# Patient Record
Sex: Female | Born: 1940 | Hispanic: No | Smoking: Former smoker
Health system: Southern US, Community
[De-identification: ages and names within clinical notes are randomized; demographics above are authoritative.]

## PROBLEM LIST (undated history)

## (undated) DIAGNOSIS — I2510 Atherosclerotic heart disease of native coronary artery without angina pectoris: Secondary | ICD-10-CM

## (undated) DIAGNOSIS — I272 Pulmonary hypertension, unspecified: Secondary | ICD-10-CM

## (undated) DIAGNOSIS — G4733 Obstructive sleep apnea (adult) (pediatric): Secondary | ICD-10-CM

## (undated) DIAGNOSIS — E785 Hyperlipidemia, unspecified: Secondary | ICD-10-CM

## (undated) DIAGNOSIS — I10 Essential (primary) hypertension: Secondary | ICD-10-CM

## (undated) DIAGNOSIS — J45909 Unspecified asthma, uncomplicated: Secondary | ICD-10-CM

## (undated) DIAGNOSIS — I5042 Chronic combined systolic (congestive) and diastolic (congestive) heart failure: Secondary | ICD-10-CM

## (undated) DIAGNOSIS — E119 Type 2 diabetes mellitus without complications: Secondary | ICD-10-CM

## (undated) HISTORY — PX: OTHER SURGICAL HISTORY: SHX169

---

## 2017-03-07 ENCOUNTER — Encounter: Payer: Self-pay | Admitting: Emergency Medicine

## 2017-03-07 ENCOUNTER — Inpatient Hospital Stay
Admission: EM | Admit: 2017-03-07 | Discharge: 2017-03-11 | DRG: 286 | Disposition: A | Discharge status: Home / Self Care | Payer: Medicare Other | Attending: Internal Medicine | Admitting: Internal Medicine

## 2017-03-07 ENCOUNTER — Emergency Department: Payer: Medicare Other

## 2017-03-07 DIAGNOSIS — J810 Acute pulmonary edema: Secondary | ICD-10-CM | POA: Diagnosis not present

## 2017-03-07 DIAGNOSIS — F329 Major depressive disorder, single episode, unspecified: Secondary | ICD-10-CM | POA: Diagnosis present

## 2017-03-07 DIAGNOSIS — Z888 Allergy status to other drugs, medicaments and biological substances status: Secondary | ICD-10-CM | POA: Diagnosis not present

## 2017-03-07 DIAGNOSIS — Z79899 Other long term (current) drug therapy: Secondary | ICD-10-CM | POA: Diagnosis not present

## 2017-03-07 DIAGNOSIS — J9601 Acute respiratory failure with hypoxia: Secondary | ICD-10-CM | POA: Diagnosis present

## 2017-03-07 DIAGNOSIS — Z7982 Long term (current) use of aspirin: Secondary | ICD-10-CM

## 2017-03-07 DIAGNOSIS — R0902 Hypoxemia: Secondary | ICD-10-CM | POA: Diagnosis not present

## 2017-03-07 DIAGNOSIS — J441 Chronic obstructive pulmonary disease with (acute) exacerbation: Secondary | ICD-10-CM | POA: Diagnosis present

## 2017-03-07 DIAGNOSIS — J9600 Acute respiratory failure, unspecified whether with hypoxia or hypercapnia: Secondary | ICD-10-CM | POA: Diagnosis not present

## 2017-03-07 DIAGNOSIS — I255 Ischemic cardiomyopathy: Secondary | ICD-10-CM | POA: Diagnosis not present

## 2017-03-07 DIAGNOSIS — Z91013 Allergy to seafood: Secondary | ICD-10-CM | POA: Diagnosis not present

## 2017-03-07 DIAGNOSIS — I5043 Acute on chronic combined systolic (congestive) and diastolic (congestive) heart failure: Secondary | ICD-10-CM | POA: Diagnosis present

## 2017-03-07 DIAGNOSIS — I110 Hypertensive heart disease with heart failure: Secondary | ICD-10-CM | POA: Diagnosis present

## 2017-03-07 DIAGNOSIS — R0602 Shortness of breath: Secondary | ICD-10-CM | POA: Diagnosis not present

## 2017-03-07 DIAGNOSIS — I5021 Acute systolic (congestive) heart failure: Secondary | ICD-10-CM

## 2017-03-07 DIAGNOSIS — I5031 Acute diastolic (congestive) heart failure: Secondary | ICD-10-CM | POA: Diagnosis not present

## 2017-03-07 DIAGNOSIS — E119 Type 2 diabetes mellitus without complications: Secondary | ICD-10-CM | POA: Diagnosis present

## 2017-03-07 DIAGNOSIS — J4550 Severe persistent asthma, uncomplicated: Secondary | ICD-10-CM | POA: Diagnosis not present

## 2017-03-07 DIAGNOSIS — I5042 Chronic combined systolic (congestive) and diastolic (congestive) heart failure: Secondary | ICD-10-CM

## 2017-03-07 DIAGNOSIS — I5033 Acute on chronic diastolic (congestive) heart failure: Secondary | ICD-10-CM | POA: Diagnosis not present

## 2017-03-07 DIAGNOSIS — E785 Hyperlipidemia, unspecified: Secondary | ICD-10-CM | POA: Diagnosis present

## 2017-03-07 DIAGNOSIS — N179 Acute kidney failure, unspecified: Secondary | ICD-10-CM | POA: Diagnosis present

## 2017-03-07 DIAGNOSIS — I340 Nonrheumatic mitral (valve) insufficiency: Secondary | ICD-10-CM | POA: Diagnosis not present

## 2017-03-07 DIAGNOSIS — J4521 Mild intermittent asthma with (acute) exacerbation: Secondary | ICD-10-CM | POA: Diagnosis present

## 2017-03-07 DIAGNOSIS — E662 Morbid (severe) obesity with alveolar hypoventilation: Secondary | ICD-10-CM | POA: Diagnosis present

## 2017-03-07 DIAGNOSIS — Z794 Long term (current) use of insulin: Secondary | ICD-10-CM | POA: Diagnosis not present

## 2017-03-07 DIAGNOSIS — I10 Essential (primary) hypertension: Secondary | ICD-10-CM | POA: Diagnosis not present

## 2017-03-07 DIAGNOSIS — Z6841 Body Mass Index (BMI) 40.0 and over, adult: Secondary | ICD-10-CM | POA: Diagnosis not present

## 2017-03-07 DIAGNOSIS — J811 Chronic pulmonary edema: Secondary | ICD-10-CM | POA: Diagnosis present

## 2017-03-07 DIAGNOSIS — J45909 Unspecified asthma, uncomplicated: Secondary | ICD-10-CM | POA: Diagnosis not present

## 2017-03-07 HISTORY — DX: Essential (primary) hypertension: I10

## 2017-03-07 HISTORY — DX: Type 2 diabetes mellitus without complications: E11.9

## 2017-03-07 HISTORY — DX: Morbid (severe) obesity due to excess calories: E66.01

## 2017-03-07 HISTORY — DX: Unspecified asthma, uncomplicated: J45.909

## 2017-03-07 HISTORY — DX: Hyperlipidemia, unspecified: E78.5

## 2017-03-07 LAB — TROPONIN I
TROPONIN I: 0.1 ng/mL — AB (ref <0.03)
Troponin I: 0.03 ng/mL (ref <0.03)
Troponin I: 0.15 ng/mL (ref <0.03)
Troponin I: 0.17 ng/mL (ref <0.03)

## 2017-03-07 LAB — GLUCOSE, CAPILLARY
GLUCOSE-CAPILLARY: 178 mg/dL — AB (ref 65–99)
GLUCOSE-CAPILLARY: 242 mg/dL — AB (ref 65–99)
GLUCOSE-CAPILLARY: 136 mg/dL — AB (ref 65–99)
Glucose-Capillary: 252 mg/dL — ABNORMAL HIGH (ref 65–99)

## 2017-03-07 LAB — COMPREHENSIVE METABOLIC PANEL
ALBUMIN: 3.5 g/dL (ref 3.5–5.0)
ALT: 18 U/L (ref 14–54)
AST: 22 U/L (ref 15–41)
Alkaline Phosphatase: 62 U/L (ref 38–126)
Anion gap: 8 (ref 5–15)
BUN: 40 mg/dL — AB (ref 6–20)
CHLORIDE: 108 mmol/L (ref 101–111)
CO2: 25 mmol/L (ref 22–32)
CREATININE: 1.21 mg/dL — AB (ref 0.44–1.00)
Calcium: 9 mg/dL (ref 8.9–10.3)
GFR calc Af Amer: 50 mL/min — ABNORMAL LOW (ref >60)
GFR calc non Af Amer: 43 mL/min — ABNORMAL LOW (ref >60)
GLUCOSE: 181 mg/dL — AB (ref 65–99)
POTASSIUM: 4.2 mmol/L (ref 3.5–5.1)
Sodium: 141 mmol/L (ref 135–145)
Total Bilirubin: 0.4 mg/dL (ref 0.3–1.2)
Total Protein: 7.2 g/dL (ref 6.5–8.1)

## 2017-03-07 LAB — BLOOD GAS, VENOUS
Acid-base deficit: 1.5 mmol/L (ref 0.0–2.0)
Bicarbonate: 25.2 mmol/L (ref 20.0–28.0)
DELIVERY SYSTEMS: POSITIVE
FIO2: 0.3
O2 Saturation: 85 %
PCO2 VEN: 50 mmHg (ref 44.0–60.0)
PH VEN: 7.31 (ref 7.250–7.430)
PO2 VEN: 55 mmHg — AB (ref 32.0–45.0)
Patient temperature: 37

## 2017-03-07 LAB — LACTIC ACID, PLASMA: Lactic Acid, Venous: 1.3 mmol/L (ref 0.5–1.9)

## 2017-03-07 LAB — FIBRIN DERIVATIVES D-DIMER (ARMC ONLY): Fibrin derivatives D-dimer (ARMC): 746.57 — ABNORMAL HIGH (ref 0.00–499.00)

## 2017-03-07 LAB — CBC
HCT: 34.1 % — ABNORMAL LOW (ref 35.0–47.0)
Hemoglobin: 11.4 g/dL — ABNORMAL LOW (ref 12.0–16.0)
MCH: 30.3 pg (ref 26.0–34.0)
MCHC: 33.4 g/dL (ref 32.0–36.0)
MCV: 90.7 fL (ref 80.0–100.0)
PLATELETS: 300 10*3/uL (ref 150–440)
RBC: 3.76 MIL/uL — AB (ref 3.80–5.20)
RDW: 14.7 % — AB (ref 11.5–14.5)
WBC: 15.2 10*3/uL — AB (ref 3.6–11.0)

## 2017-03-07 MED ORDER — ALBUTEROL SULFATE (2.5 MG/3ML) 0.083% IN NEBU
2.5000 mg | INHALATION_SOLUTION | Freq: Once | RESPIRATORY_TRACT | Status: DC
  Filled 2017-03-07: qty 3

## 2017-03-07 MED ORDER — ALBUTEROL SULFATE (2.5 MG/3ML) 0.083% IN NEBU
2.5000 mg | INHALATION_SOLUTION | RESPIRATORY_TRACT | Status: DC | PRN
Start: 2017-03-07 — End: 2017-03-10
  Administered 2017-03-07 – 2017-03-10 (×5): 2.5 mg via RESPIRATORY_TRACT
  Filled 2017-03-07 (×4): qty 3

## 2017-03-07 MED ORDER — POTASSIUM CHLORIDE CRYS ER 20 MEQ PO TBCR
20.0000 meq | EXTENDED_RELEASE_TABLET | Freq: Every day | ORAL | Status: DC
  Administered 2017-03-07 – 2017-03-10 (×4): 20 meq via ORAL
  Filled 2017-03-07 (×3): qty 1

## 2017-03-07 MED ORDER — ENOXAPARIN SODIUM 40 MG/0.4ML ~~LOC~~ SOLN
40.0000 mg | SUBCUTANEOUS | Status: DC
  Administered 2017-03-07 – 2017-03-11 (×4): 40 mg via SUBCUTANEOUS
  Filled 2017-03-07 (×4): qty 0.4

## 2017-03-07 MED ORDER — MAGNESIUM SULFATE 2 GM/50ML IV SOLN
2.0000 g | Freq: Once | INTRAVENOUS | Status: AC
  Administered 2017-03-07: 2 g via INTRAVENOUS

## 2017-03-07 MED ORDER — IPRATROPIUM BROMIDE 0.02 % IN SOLN: 0.5000 mg | RESPIRATORY_TRACT | Status: DC | PRN

## 2017-03-07 MED ORDER — FUROSEMIDE 10 MG/ML IJ SOLN: 40.0000 mg | Freq: Two times a day (BID) | INTRAMUSCULAR | Status: DC

## 2017-03-07 MED ORDER — TETRAHYDROZOLINE HCL 0.05 % OP SOLN
1.0000 [drp] | Freq: Four times a day (QID) | OPHTHALMIC | Status: DC | PRN
  Administered 2017-03-07 – 2017-03-08 (×2): 2 [drp] via OPHTHALMIC
  Administered 2017-03-09 (×2): 1 [drp] via OPHTHALMIC
  Filled 2017-03-07: qty 15

## 2017-03-07 MED ORDER — ALBUTEROL SULFATE (2.5 MG/3ML) 0.083% IN NEBU
INHALATION_SOLUTION | RESPIRATORY_TRACT | Status: AC
  Administered 2017-03-07: 10 mg via RESPIRATORY_TRACT
  Filled 2017-03-07: qty 12

## 2017-03-07 MED ORDER — AMLODIPINE BESYLATE 10 MG PO TABS
10.0000 mg | ORAL_TABLET | Freq: Every day | ORAL | Status: DC
  Administered 2017-03-07 – 2017-03-09 (×3): 10 mg via ORAL
  Filled 2017-03-07 (×2): qty 1

## 2017-03-07 MED ORDER — MAGNESIUM SULFATE 2 GM/50ML IV SOLN
INTRAVENOUS | Status: AC
  Administered 2017-03-07: 2 g via INTRAVENOUS
  Filled 2017-03-07: qty 50

## 2017-03-07 MED ORDER — ORAL CARE MOUTH RINSE: 15.0000 mL | Freq: Two times a day (BID) | OROMUCOSAL | Status: DC

## 2017-03-07 MED ORDER — ASPIRIN EC 81 MG PO TBEC
81.0000 mg | DELAYED_RELEASE_TABLET | Freq: Every day | ORAL | Status: DC
  Administered 2017-03-07 – 2017-03-11 (×4): 81 mg via ORAL
  Filled 2017-03-07 (×3): qty 1

## 2017-03-07 MED ORDER — ALBUTEROL SULFATE (2.5 MG/3ML) 0.083% IN NEBU: 2.5000 mg | INHALATION_SOLUTION | Freq: Once | RESPIRATORY_TRACT | Status: DC

## 2017-03-07 MED ORDER — MOMETASONE FURO-FORMOTEROL FUM 200-5 MCG/ACT IN AERO
2.0000 | INHALATION_SPRAY | Freq: Two times a day (BID) | RESPIRATORY_TRACT | Status: DC
  Administered 2017-03-07 – 2017-03-11 (×10): 2 via RESPIRATORY_TRACT
  Filled 2017-03-07: qty 8.8

## 2017-03-07 MED ORDER — ACETAMINOPHEN 325 MG PO TABS: 650.0000 mg | ORAL_TABLET | ORAL | Status: DC | PRN

## 2017-03-07 MED ORDER — SODIUM CHLORIDE 0.9 % IV SOLN: 250.0000 mL | INTRAVENOUS | Status: DC | PRN

## 2017-03-07 MED ORDER — LACTULOSE 10 GM/15ML PO SOLN
30.0000 g | Freq: Every day | ORAL | Status: DC | PRN
  Administered 2017-03-08 – 2017-03-11 (×2): 30 g via ORAL
  Filled 2017-03-07 (×2): qty 60

## 2017-03-07 MED ORDER — ALBUTEROL (5 MG/ML) CONTINUOUS INHALATION SOLN: 10.0000 mg/h | INHALATION_SOLUTION | RESPIRATORY_TRACT | Status: DC

## 2017-03-07 MED ORDER — FUROSEMIDE 10 MG/ML IJ SOLN
40.0000 mg | Freq: Once | INTRAMUSCULAR | Status: AC
  Administered 2017-03-07: 40 mg via INTRAVENOUS
  Filled 2017-03-07: qty 4

## 2017-03-07 MED ORDER — METHYLPREDNISOLONE SODIUM SUCC 125 MG IJ SOLR
125.0000 mg | Freq: Once | INTRAMUSCULAR | Status: DC
  Filled 2017-03-07: qty 2

## 2017-03-07 MED ORDER — INSULIN ASPART 100 UNIT/ML ~~LOC~~ SOLN
0.0000 [IU] | Freq: Every day | SUBCUTANEOUS | Status: DC
  Administered 2017-03-07: 3 [IU] via SUBCUTANEOUS
  Administered 2017-03-08: 2 [IU] via SUBCUTANEOUS
  Filled 2017-03-07: qty 3
  Filled 2017-03-07: qty 1

## 2017-03-07 MED ORDER — NAPHAZOLINE-GLYCERIN 0.012-0.2 % OP SOLN: 1.0000 [drp] | Freq: Four times a day (QID) | OPHTHALMIC | Status: DC | PRN

## 2017-03-07 MED ORDER — ORAL CARE MOUTH RINSE
15.0000 mL | Freq: Two times a day (BID) | OROMUCOSAL | Status: DC
  Administered 2017-03-07 – 2017-03-11 (×7): 15 mL via OROMUCOSAL

## 2017-03-07 MED ORDER — ALBUTEROL SULFATE (2.5 MG/3ML) 0.083% IN NEBU
2.5000 mg | INHALATION_SOLUTION | Freq: Once | RESPIRATORY_TRACT | Status: AC
  Administered 2017-03-07: 10 mg via RESPIRATORY_TRACT

## 2017-03-07 MED ORDER — SODIUM CHLORIDE 0.9% FLUSH
3.0000 mL | Freq: Two times a day (BID) | INTRAVENOUS | Status: DC
  Administered 2017-03-07 – 2017-03-10 (×6): 3 mL via INTRAVENOUS

## 2017-03-07 MED ORDER — METHYLPREDNISOLONE SODIUM SUCC 125 MG IJ SOLR
60.0000 mg | Freq: Four times a day (QID) | INTRAMUSCULAR | Status: DC
  Administered 2017-03-07 – 2017-03-08 (×6): 60 mg via INTRAVENOUS
  Filled 2017-03-07 (×4): qty 2

## 2017-03-07 MED ORDER — ATORVASTATIN CALCIUM 20 MG PO TABS
40.0000 mg | ORAL_TABLET | Freq: Every day | ORAL | Status: DC
  Administered 2017-03-07 – 2017-03-11 (×5): 40 mg via ORAL
  Filled 2017-03-07 (×5): qty 2

## 2017-03-07 MED ORDER — MIRTAZAPINE 15 MG PO TABS
45.0000 mg | ORAL_TABLET | Freq: Every day | ORAL | Status: DC
  Administered 2017-03-07 – 2017-03-10 (×4): 45 mg via ORAL
  Filled 2017-03-07 (×5): qty 3

## 2017-03-07 MED ORDER — ONDANSETRON HCL 4 MG/2ML IJ SOLN: 4.0000 mg | Freq: Four times a day (QID) | INTRAMUSCULAR | Status: DC | PRN

## 2017-03-07 MED ORDER — CARVEDILOL 3.125 MG PO TABS
3.1250 mg | ORAL_TABLET | Freq: Two times a day (BID) | ORAL | Status: DC
  Administered 2017-03-07 (×2): 3.125 mg via ORAL
  Filled 2017-03-07 (×2): qty 1

## 2017-03-07 MED ORDER — INSULIN ASPART 100 UNIT/ML ~~LOC~~ SOLN
0.0000 [IU] | Freq: Three times a day (TID) | SUBCUTANEOUS | Status: DC
  Administered 2017-03-07: 3 [IU] via SUBCUTANEOUS
  Administered 2017-03-07: 5 [IU] via SUBCUTANEOUS
  Administered 2017-03-07: 2 [IU] via SUBCUTANEOUS
  Administered 2017-03-08: 3 [IU] via SUBCUTANEOUS
  Administered 2017-03-08: 5 [IU] via SUBCUTANEOUS
  Administered 2017-03-08: 3 [IU] via SUBCUTANEOUS
  Administered 2017-03-09: 8 [IU] via SUBCUTANEOUS
  Administered 2017-03-09: 3 [IU] via SUBCUTANEOUS
  Administered 2017-03-10: 5 [IU] via SUBCUTANEOUS
  Administered 2017-03-10 (×2): 2 [IU] via SUBCUTANEOUS
  Administered 2017-03-11 (×2): 3 [IU] via SUBCUTANEOUS
  Filled 2017-03-07: qty 5
  Filled 2017-03-07: qty 3
  Filled 2017-03-07 (×7): qty 1
  Filled 2017-03-07: qty 2
  Filled 2017-03-07: qty 1

## 2017-03-07 MED ORDER — SODIUM CHLORIDE 0.9% FLUSH: 3.0000 mL | INTRAVENOUS | Status: DC | PRN

## 2017-03-07 MED ORDER — FUROSEMIDE 10 MG/ML IJ SOLN
20.0000 mg | Freq: Two times a day (BID) | INTRAMUSCULAR | Status: DC
  Administered 2017-03-07 (×2): 20 mg via INTRAVENOUS
  Filled 2017-03-07 (×2): qty 2

## 2017-03-07 NOTE — Consult Note (Signed)
Cardiology Consultation Note  Patient ID: Angela Pham, MRN: [id number], DOB/AGE: [birth year] 76 y.o. Admit date: 03/07/2017   Date of Consult: 03/07/2017 Primary Physician: [facility], Wanda Primary Cardiologist: Dr. Fletcher Anon, MD Requesting Physician: Dr. Saundra Shelling, MD  Chief Complaint: SOB / Respiratory distress Reason for Consult: Elevated troponin in setting of acute respiratory distress  HPI: Angela Pham is a 76 y.o. female who is being seen today for the evaluation of dyspnea, chest tightness, and elevated troponin in the setting of acute respiratory distress and at the request of Dr. Saundra Shelling, MD. Per ED & pt. documentation, patient has a h/o acute congestive heart failure, pulmonary edema, bronchial exacerbation, asthma, HTN, DM2, & HLD.   Currently, patient is not experiencing any chest pain or tightness but does endorse SOB. She reports that she started having trouble with wheezing this weekend & that, around 12AM this Monday morning (6/11), she felt that she could not breathe and so administered 5 neb treatments between 12AM-1:30AM. She states the treatments did not povide relief; therefore, she presented to [facility] ED via ambulance. She reports feeling a midsternum chest tightness (rated 3.5/10) around that time that did not radiate & without any associated numbness, tingling, diaphoresis, n/v/d, or abdominal pain. She states that she also noted chest tightness, emphasizing that her main concern is her inability to breathe, stating "I am so short of breath that I can barely make it to the bathroom."  The patient does not report any past cardiac history though ED documentation reports congestive heart failure. Pt. states that since moving from [place] to [place] in May 2017, she has noticed trouble breathing. She attributes her breathing exacerbations to [place] allergies and an increased humidity. She reports her father received a pacemaker and died of a heart attack  sometime after the age of 74 y.o. She admits she is a poor historian regarding family history d/t leaving her family & [place] in the 90s and not remaining in communication with her family. Today, she reports all of her immediate family as deceased & thinks that her mother, 3 brothers, and 3 sisters, are all deceased d/t cancer.   The patient is a retired Educational psychologist since 2015 and reports a poor diet and no exercise. She reports that even before this weekend's exacerbation, she could not walk a flight of stairs and often found herself SOB when walking to the bathroom. She reports a past history of smoking (1 pack / week, quit in 90s) and drinking (quit in 2016) but currently does not drink, smoke cigarettes, or use any illicit drugs.   Upon the patient's arrival to [facility] she was placed on BiPAP after being found to have BP 139-144/54-72, HR 117-138, temp 97.7-97.9, oxygen saturation 85-98% on room air, & weight 171 pounds. EKG showed sinus tachycardia with multiple PVCs, old anterior MI, nonspecific AV conduction delay, and borderline T abnormalities in the inferior leads, CXR showed cardiomegaly with mild central congestion and mild diffuse interstitial opacities, suggestive of mild edema. Labs showed increased WBC (15.2), decreased RBC(3.76)/Hbg(11.4)/HCT (34.1); increased BUN (40) and creatinine (1.21); increased troponin (0.03 --> 0.10); increased glucose (181).  The patient reports taking metformin for DM2, atorvastatin for HLD, and amlodipine / losartan for HTN.   Past Medical History:  Diagnosis Date  . Asthma   . Diabetes ([place])   . Hyperlipidemia   . Hypertension       Most Recent Cardiac Studies: Echo pending   Surgical History:  Past Surgical History:  Procedure  Laterality Date  . c-section       Home Meds: Prior to Admission medications   Medication Sig Start Date End Date Taking? Authorizing Provider  amLODipine (NORVASC) 10 MG tablet Take 10 mg by mouth daily.   Yes [provider]  atorvastatin (LIPITOR) 40 MG tablet Take 40 mg by mouth daily.   Yes [provider]  Fluticasone-Salmeterol (ADVAIR DISKUS) 500-50 MCG/DOSE AEPB Inhale 1 puff into the lungs 2 (two) times daily.   Yes [provider]  Ipratropium-Albuterol (COMBIVENT IN) Inhale 3 puffs into the lungs every 4 (four) hours as needed.   Yes [provider]  ipratropium-albuterol (DUONEB) 0.5-2.5 (3) MG/3ML SOLN Inhale 3 mLs into the lungs every 4 (four) hours as needed.   Yes [provider]  lactulose (CHRONULAC) 10 GM/15ML solution Take 30 g by mouth daily.   Yes [provider]  losartan (COZAAR) 100 MG tablet Take 100 mg by mouth daily.   Yes [provider]  metFORMIN (GLUCOPHAGE) 850 MG tablet Take 1 tablet by mouth 2 (two) times daily.   Yes [provider]  mirtazapine (REMERON) 45 MG tablet Take 1 tablet by mouth at bedtime.   Yes [provider]  montelukast (SINGULAIR) 10 MG tablet Take 10 tablets by mouth daily.   Yes [provider]  Multiple Vitamins-Minerals (EYE VITAMINS PO) Take 1 tablet by mouth daily.   Yes [provider]  NON FORMULARY Take 1 tablet by mouth daily.   Yes [provider]    Inpatient Medications:  . albuterol  2.5 mg Nebulization Once   Followed by  . albuterol  2.5 mg Nebulization Once  . aspirin EC  81 mg Oral Daily  . carvedilol  3.125 mg Oral BID WC  . enoxaparin (LOVENOX) injection  40 mg Subcutaneous Q24H  . furosemide  20 mg Intravenous BID  . insulin aspart  0-15 Units Subcutaneous TID WC  . insulin aspart  0-5 Units Subcutaneous QHS  . mouth rinse  15 mL Mouth Rinse BID  . methylPREDNISolone (SOLU-MEDROL) injection  60 mg Intravenous Q6H  . potassium chloride  20 mEq Oral Daily  . sodium chloride flush  3 mL Intravenous Q12H   . sodium chloride      Allergies:  Allergies  Allergen Reactions  . Shellfish Allergy Hives and Swelling    Swelling  around face and mouth     Social History   Social History  . Marital status: Single    Spouse name: N/A  . Number of children: N/A  . Years of education: N/A   Occupational History  . Not on file.   Social History Main Topics  . Smoking status: Never Smoker  . Smokeless tobacco: Never Used  . Alcohol use No  . Drug use: No  . Sexual activity: No   Other Topics Concern  . Not on file   Social History Narrative  . No narrative on file     History reviewed. No pertinent family history.   Review of Systems: Review of Systems  Constitutional: Positive for malaise/fatigue. Negative for diaphoresis.  HENT: Negative.   Eyes: Negative.  Negative for blurred vision and double vision.  Respiratory: Positive for shortness of breath and wheezing.   Cardiovascular: Positive for orthopnea and PND. Negative for chest pain and palpitations.  Gastrointestinal: Negative.  Negative for abdominal pain.  Genitourinary: Negative.  Negative for dysuria and urgency.  Musculoskeletal: Negative.   Skin: Negative.   Neurological:  Positive for weakness. Negative for tingling, sensory change and headaches.  Endo/Heme/Allergies: Positive for environmental allergies.  Psychiatric/Behavioral: Negative.     Labs:  Recent Labs  03/07/17 0227 03/07/17 0831  TROPONINI 0.03* 0.10*   Lab Results  Component Value Date   WBC 15.2 (H) 03/07/2017   HGB 11.4 (L) 03/07/2017   HCT 34.1 (L) 03/07/2017   MCV 90.7 03/07/2017   PLT 300 03/07/2017    Recent Labs Lab 03/07/17 0227  NA 141  K 4.2  CL 108  CO2 25  BUN 40*  CREATININE 1.21*  CALCIUM 9.0  PROT 7.2  BILITOT 0.4  ALKPHOS 62  ALT 18  AST 22  GLUCOSE 181*   Radiology/Studies:  Dg Chest Portable 1 View  Result Date: 03/07/2017 CLINICAL DATA:  Shortness of breath EXAM: PORTABLE CHEST 1 VIEW COMPARISON:  None. FINDINGS: Cardiomegaly and with mild central congestion and mild perihilar interstitial opacities suggestive of mild  edema. No effusion or consolidation. Aortic atherosclerosis. No pneumothorax. IMPRESSION: Cardiomegaly with mild central congestion and mild diffuse interstitial opacities suggestive of mild edema. Electronically Signed   By: Donavan Foil M.D.   On: 03/07/2017 02:47    EKG: Interpreted by me showed: EKG showed sinus tachycardia 136, RAD,with multiple PVCs, old anterior MI, nonspecific AV conduction delay. Telemetry: Interpreted by me showed:sinus tachycardia without PVCs and HR 107  Weights: Filed Weights   03/07/17 0222  Weight: 171 lb (77.6 kg)     Physical Exam: Blood pressure (!) 143/72, pulse (!) 120, temperature 97.9 F (36.6 C), temperature source Oral, resp. rate (!) 21, height 4\' 7"  (1.397 m), weight 171 lb (77.6 kg), SpO2 97 %. Body mass index is 39.74 kg/m. General: Well developed, well nourished, in mild distress. Head: Normocephalic, atraumatic, sclera non-icteric, no xanthomas, nares are without discharge.  Neck: JVD difficult to gauge 2/2 girth. No bruits. Lungs: ++Diminished breath sounds with ++ b/l wheezes. No rales, or rhonchi. ++Mild tachypnea and labored. Heart: RRR with S1 S2. No murmurs, rubs, or gallops appreciated. Abdomen: Soft, non-tender, non-distended with normoactive bowel sounds. No hepatomegaly. No rebound/guarding. No obvious abdominal masses. Msk:  Strength and tone appear normal for age. Extremities: No clubbing or cyanosis. B/l LE edema. Distal pedal pulses are 2+ and equal bilaterally. Neuro: Alert and oriented X 3. No facial asymmetry. No focal deficit. Moves all extremities spontaneously. Psych:  Responds to questions appropriately with a normal affect.    Assessment and Plan:  Active Problems:   Pulmonary edema   CHF (congestive heart failure) ([place])   1. Acute Pulmonary edema/CHF (?syst vs diast) - CXR: cardiomegaly with mild central congestion and mild diffuse interstitial opacities suggestive of mild edema. -  Continue O2 via nasal  cannula -  Monitor volume status and monitor pulmonary edema -  Continue IV Lasix 20mg  BID - Continue to monitor oxygen and venous saturation - Echo pending. - Continue to monitor via telemetry - Daily weight - Strict I/Os (currently +600) - Continue carvedilol 3.125mg  PO BID, aspirin 81mg  PO daily - Continue to trend troponin (0.03, 0.1) - Monitor kidney function (creatinine, BUN) with diuresis  - Continue to monitor electrolytes with BMP - Heart healthy diet  2.  Acute resp failure/Asthma -H/o asthma dating back to childhood. -worse since move from [place] to [place] 1 yr ago.   -Frequently requires nebs and rescue inhaler - worse this am - Suspect resp failure multifactorial in the setting of #1 and #2. - Inhalers per IM. - Diurese as above -  Echo pending.  3. DM2 - Continue metformin 850mg  BID; insulin as needed  - Continue to monitor blood sugars (6/11 at 178)  4. HTN - Continue losartan 100mg  PO daily, amlodipine 10mg  PO daily - Monitor BP and volume overload  5. HLD - Continue atorvastatin 40mg  PO daily - Order full lipid panel  6.  Sinus tachycardia - In setting of above.  Persists despite some degree of clinical improvement. - Check D dimer with low threshold to obtain CT vs V:Q (creat mildly elevated).  7.  Elevated troponin: - in setting of above.   - Probably represents demand ischemia. - Check d dimer. - Echo pending.  If EF nl, will plan on MV.  If EF depressed, will need cath.  Signed, Marrianne Mood, PA-S  03/07/2017, 10:23 AM   _____________  Pt seen with Ms. Visser.  I have obtained history and examined Ms. Bixler independently.  76 year old female without prior cardiac history. She has a long history of asthma dating back to childhood. She moved to [place] one year ago and lives in [place] with her niece. She is relatively sedentary and does note some degree of mild lower extremity swelling in the setting of sitting most of the day. She has had  worsening dyspnea over the spring associated with wheezing. She frequently uses nebulizers and rescue inhalers. She had some dyspnea requiring multiple nebulizers on the morning of June 10 and then awoke in the early morning hours of June 11 with acute dyspnea that was unrelieved after using 5 nebulizers. Her niece called 54. She says symptoms improved after being placed on BiPAP. In the ED, chest x-ray suggested mild volume overload. White count was elevated. She has been admitted and placed on IV Lasix. She notes that she occasionally has chest tightness but only in the setting of significant wheezing. On examination, Ms. Oblinger is an obese Afro-American female. She is in no acute distress. She is awake alert and oriented 3. Her neck is obese and supple. Difficult to gauge JVP secondary to girth. No bruits. Lungs respirations regular and unlabored, clear to auscultation. Cardiac regular S1-S2, no S3, S4, or murmurs. She is tachycardic. Abdomen is obese soft, nontender, nondistended, bowel  sounds present 4. extremities warm dry, no clubbing, cyanosis. She has 1+ bilateral ankle edema. Mrs. Panos presents with acute respiratory failure which is likely multifactorial in the setting of asthma/COPD requiring frequent rescue inhaler therapy and nebulizers, along with acute pulmonary edema by chest x-ray. She has been treated with IV Lasix. Breathing has improved some and she is currently lying flat. She continues to have evidence of volume overload with lower extremity edema on examination. Echocardiogram is currently pending. She continues to have sinus tachycardia despite some degree of clinical improvement. Troponin is mildly elevated. EKG shows right axis with IVCD. Continue to follow troponins. Check d-dimer to rule out PE. Provided that echo cardioversion shows normal LV function, we would likely plan on stress testing in the a.m. If however, LV function as noted, she will likely need diagnostic  catheterization.  She would also benefit from outpt sleep study.  Murray Hodgkins, NP 03/07/2017, 2:40 PM

## 2017-03-07 NOTE — Progress Notes (Signed)
Chart reviewed patient previously seen Cardiology team was in the room when I went to the patient. Continue IV Lasix for CHF exacerbation Follow up on echo and cardiology recommendations.

## 2017-03-07 NOTE — ED Notes (Signed)
RT, Angela Pham, called for VBG and neb through bi-pap

## 2017-03-07 NOTE — ED Notes (Signed)
Date and time results received: 03/07/17 0340  Test: Troponin Critical Value: 0.03  Name of Provider Notified: Paduchowski  Orders Received? Or Actions Taken?: None

## 2017-03-07 NOTE — ED Triage Notes (Signed)
Patient presents to Emergency Department via [facility] with complaints of respiratory distress.   Hx of asthma, HTN, DM, high cholesterol  EMS gave 125 solumedrol, 2 x albuterol, 2 gram mag,   Pt treated self with 5 x duoneb treatments

## 2017-03-07 NOTE — ED Notes (Signed)
Bi-pap on standby att, Murray started att per EDP, RT notified

## 2017-03-07 NOTE — ED Notes (Signed)
Hospitalist at bedside 

## 2017-03-07 NOTE — ED Provider Notes (Signed)
[facility] Emergency Department Provider Note  Time seen: 2:23 AM  I have reviewed the triage vital signs and the nursing notes.   HISTORY  Chief Complaint Shortness of Breath and Respiratory Distress    HPI Angela Pham is a 76 y.o. female with a past medical history of asthma presents the emergency department for difficulty breathing. According to the patient for the past week or 2 she has been experiencing intermittent difficulty breathing. She states tonight it worsened significantly and quickly. She called EMS due to trouble breathing. Patient states she took 4-5 DuoNeb treatments at home without relief. EMS states a room air saturation of 85% on arrival. I placed the patient on CPAP, dosed 2 additional DuoNeb's, 125 mg of Solu-Medrol and 2 g of magnesium. Upon arrival the patient is in mild respiratory distress sitting upright in bed on CPAP satting 91-92%. Patient states mild chest pain/tightness. Significant shortness of breath, no leg pain or swelling. No history of COPD or congestive heart failure per patient. Patient is hypertensive [address] per EMS. Patient transitioned to BiPAP in the emergency department.  No past medical history on file.  There are no active problems to display for this patient.   No past surgical history on file.  Prior to Admission medications   Not on File    Allergies  Allergen Reactions  . Shellfish Allergy     No family history on file.  Social History Social History  Substance Use Topics  . Smoking status: Not on file  . Smokeless tobacco: Not on file  . Alcohol use Not on file    Review of Systems Constitutional: Negative for fever. Cardiovascular: Mild chest pain Respiratory: Significant shortness of breath Gastrointestinal: Negative for abdominal pain Musculoskeletal: Negative for leg swelling All other ROS negative  ____________________________________________   PHYSICAL EXAM:  VITAL SIGNS: ED  Triage Vitals  Enc Vitals Group     BP --      Pulse Rate 03/07/17 0221 (!) 138     Resp 03/07/17 0221 (!) 24     Temp --      Temp src --      SpO2 03/07/17 0218 (!) 85 %     Weight 03/07/17 0222 171 lb (77.6 kg)     Height 03/07/17 0222 4\' 7"  (1.397 m)     Head Circumference --      Peak Flow --      Pain Score --      Pain Loc --      Pain Edu? --      Excl. in [place]? --     Constitutional: Alert and oriented. Mild distress due to difficulty breathing Eyes: Normal exam ENT   Head: Normocephalic and atraumatic.   Mouth/Throat: Mucous membranes are moist. Cardiovascular: Normal rate, regular rhythm. No murmur Respiratory: Moderate tachypnea, significantly diminished breath sounds bilaterally without obvious wheezes rales or rhonchi. Gastrointestinal: Soft and nontender. No distention.   Musculoskeletal: Nontender with normal range of motion in all extremities. No lower extremity tenderness or edema. Neurologic:  Normal speech and language. No gross focal neurologic deficits Skin:  Skin is warm, dry and intact.  Psychiatric: Mood and affect are normal.   ____________________________________________    EKG  EKG shows sinus tachycardia 136 bpm, widened QRS, right axis deviation, Larson normal intervals and nonspecific ST changes. No ST elevation. Occasional PVCs.  ____________________________________________    RADIOLOGY  X-ray most consistent with pulmonary edema  ____________________________________________   INITIAL IMPRESSION /  ASSESSMENT AND PLAN / ED COURSE  Pertinent labs & imaging results that were available during my care of the patient were reviewed by me and considered in my medical decision making (see chart for details).  Patient presents the emergency department with difficulty breathing. Patient initially in mild respiratory distress, satting 91% on CPAP on arrival. Patient transitioned BiPAP and is much improved. Appears calm and comfortable.  States mild chest discomfort, moderate shortness of breath. No COPD or CHF history. No lower extremity edema. Patient satting 100% currently on BiPAP. We'll check labs, chest x-ray and continue to closely monitor. We will continue with IV magnesium in the emergency department.  X-ray most consistent with pulmonary edema. Blood pressure has decreased in the emergency department. Patient much more comfortable on BiPAP. We will dose IV Lasix. No history of pulmonary edema in the past per patient.   CRITICAL CARE Performed by: Harvest Dark   Total critical care time: 45 minutes  Critical care time was exclusive of separately billable procedures and treating other patients.  Critical care was necessary to treat or prevent imminent or life-threatening deterioration.  Critical care was time spent personally by me on the following activities: development of treatment plan with patient and/or surrogate as well as nursing, discussions with consultants, evaluation of patient's response to treatment, examination of patient, obtaining history from patient or surrogate, ordering and performing treatments and interventions, ordering and review of laboratory studies, ordering and review of radiographic studies, pulse oximetry and re-evaluation of patient's condition.   ____________________________________________   FINAL CLINICAL IMPRESSION(S) / ED DIAGNOSES  Dyspnea Congestive heart failure   Harvest Dark, MD 03/07/17 [phone]

## 2017-03-07 NOTE — ED Notes (Signed)
Patient taken off of BiPap and placed on 2L nasal cannula.  RT informed.

## 2017-03-07 NOTE — Care Management (Signed)
Admitted with congestive heart failure which appears to be new according to patient but it is verbally reported by care team member there is previous documentation.  Independent in all adls, denies issues accessing medical care, obtaining medications or with transportation. Patient has had decrease in her activity tolerance due to shortness of breath. Current with her PCP- Dr Gwynneth Aliment at [facility]. Current need for oxygen is acute.  may benefit from [facility]

## 2017-03-07 NOTE — H&P (Signed)
[place] at [place] NAME: Angela Pham    MR#:  [id number]  DATE OF BIRTH:  [birth year]  DATE OF ADMISSION:  03/07/2017  PRIMARY CARE PHYSICIAN: No primary care provider on file.   REQUESTING/REFERRING PHYSICIAN:   CHIEF COMPLAINT:   Chief Complaint  Patient presents with  . Shortness of Breath  . Respiratory Distress    HISTORY OF PRESENT ILLNESS: Angela Pham  is a 76 y.o. female with a known history of Bronchial asthma, diabetes mellitus, hyperlipidemia, hypertension presented to the emergency room with shortness of breath since last few weeks. Patient has a wheezing as well as swelling in both the legs. She has been retaining fluid and has orthopnea. She felt wheezy and tried some breathing treatments at home but did not help. Her oxygen saturation was low around 85% when she presented to the emergency room on room air. She was initially put on BiPAP and stabilized and blood pressure was also elevated and she came to the emergency room. Chest x-ray showed pulmonary congestion and edema. She was given IV Lasix for diuresis in the emergency room. Her blood pressure improved and she felt better and shortness of breath also came down and she was weaned of BiPAP. No complaints of any palpitations. Has some chest discomfort. Aching in nature 4 out of 10 on a scale of 1-10. Hospitalist service was consulted for further care of the patient.  PAST MEDICAL HISTORY:   Past Medical History:  Diagnosis Date  . Asthma   . Diabetes ([place])   . Hyperlipidemia   . Hypertension     PAST SURGICAL HISTORY: Past Surgical History:  Procedure Laterality Date  . c-section      SOCIAL HISTORY:  Social History  Substance Use Topics  . Smoking status: Never Smoker  . Smokeless tobacco: Never Used  . Alcohol use No    FAMILY HISTORY: History reviewed. No pertinent family history.  DRUG ALLERGIES:  Allergies  Allergen Reactions  . Shellfish Allergy  Hives and Swelling    Swelling around face and mouth     REVIEW OF SYSTEMS:   CONSTITUTIONAL: No fever, fatigue or weakness.  EYES: No blurred or double vision.  EARS, NOSE, AND THROAT: No tinnitus or ear pain.  RESPIRATORY: occasional cough, has shortness of breath, wheezing  No hemoptysis.  CARDIOVASCULAR: Has chest pain, orthopnea, edema.  GASTROINTESTINAL: No nausea, vomiting, diarrhea or abdominal pain.  GENITOURINARY: No dysuria, hematuria.  ENDOCRINE: No polyuria, nocturia,  HEMATOLOGY: No anemia, easy bruising or bleeding SKIN: No rash or lesion. MUSCULOSKELETAL: No joint pain or arthritis.   NEUROLOGIC: No tingling, numbness, weakness.  PSYCHIATRY: No anxiety or depression.   MEDICATIONS AT HOME:  Prior to Admission medications   Not on File      PHYSICAL EXAMINATION:   VITAL SIGNS: Blood pressure 139/68, pulse (!) 117, temperature 97.7 F (36.5 C), temperature source Axillary, resp. rate 19, height 4\' 7"  (1.397 m), weight 77.6 kg (171 lb), SpO2 98 %.  GENERAL:  76 y.o.-year-old patient lying in the bed with no acute distress.  EYES: Pupils equal, round, reactive to light and accommodation. No scleral icterus. Extraocular muscles intact.  HEENT: Head atraumatic, normocephalic. Oropharynx and nasopharynx clear.  NECK:  Supple, no jugular venous distention. No thyroid enlargement, no tenderness.  LUNGS: Decreased breath sounds bilaterally, bilateral wheezing, bibasilar crepitations. No use of accessory muscles of respiration.  CARDIOVASCULAR: S1, S2 normal. No murmurs, rubs, or gallops.  ABDOMEN: Soft,  nontender, nondistended. Bowel sounds present. No organomegaly or mass.  EXTREMITIES: Has pedal edema,  No cyanosis, or clubbing.  NEUROLOGIC: Cranial nerves II through XII are intact. Muscle strength 5/5 in all extremities. Sensation intact. Gait not checked.  PSYCHIATRIC: The patient is alert and oriented x 3.  SKIN: No obvious rash, lesion, or ulcer.   LABORATORY  PANEL:   CBC  Recent Labs Lab 03/07/17 0227  WBC 15.2*  HGB 11.4*  HCT 34.1*  PLT 300  MCV 90.7  MCH 30.3  MCHC 33.4  RDW 14.7*   ------------------------------------------------------------------------------------------------------------------  Chemistries   Recent Labs Lab 03/07/17 0227  NA 141  K 4.2  CL 108  CO2 25  GLUCOSE 181*  BUN 40*  CREATININE 1.21*  CALCIUM 9.0  AST 22  ALT 18  ALKPHOS 62  BILITOT 0.4   ------------------------------------------------------------------------------------------------------------------ estimated creatinine clearance is 33.6 mL/min (A) (by C-G formula based on SCr of 1.21 mg/dL (H)). ------------------------------------------------------------------------------------------------------------------ No results for input(s): TSH, T4TOTAL, T3FREE, THYROIDAB in the last 72 hours.  Invalid input(s): FREET3   Coagulation profile No results for input(s): INR, PROTIME in the last 168 hours. ------------------------------------------------------------------------------------------------------------------- No results for input(s): DDIMER in the last 72 hours. -------------------------------------------------------------------------------------------------------------------  Cardiac Enzymes  Recent Labs Lab 03/07/17 0227  TROPONINI 0.03*   ------------------------------------------------------------------------------------------------------------------ Invalid input(s): POCBNP  ---------------------------------------------------------------------------------------------------------------  Urinalysis No results found for: COLORURINE, APPEARANCEUR, LABSPEC, PHURINE, GLUCOSEU, HGBUR, BILIRUBINUR, KETONESUR, PROTEINUR, UROBILINOGEN, NITRITE, LEUKOCYTESUR   RADIOLOGY: Dg Chest Portable 1 View  Result Date: 03/07/2017 CLINICAL DATA:  Shortness of breath EXAM: PORTABLE CHEST 1 VIEW COMPARISON:  None. FINDINGS: Cardiomegaly and  with mild central congestion and mild perihilar interstitial opacities suggestive of mild edema. No effusion or consolidation. Aortic atherosclerosis. No pneumothorax. IMPRESSION: Cardiomegaly with mild central congestion and mild diffuse interstitial opacities suggestive of mild edema. Electronically Signed   By: Donavan Foil M.D.   On: 03/07/2017 02:47    EKG: Orders placed or performed during the hospital encounter of 03/07/17  . ED EKG  . ED EKG  . EKG 12-Lead  . EKG 12-Lead    IMPRESSION AND PLAN: 76 year old female patient with history of bronchial asthma, hypertension and hyperlipidemia and diabetes mellitus presented to the emergency room with the difficulty breathing. Admitting diagnosis 1. Acute congestive heart failure 2. Pulmonary edema 3. Acute bronchial last exacerbation 4. Hypertension Treatment plan Admit patient to telemetry Diurese patient with IV Lasix 40 MG every 12 hourly Albuterol nebulization treatments Start patient on ACE inhibitor and beta blocker Oxygen via nasal cannula Control blood sugars IV Solu-Medrol 125 every 6 hourly Cycle troponin to rule out ischemia Check echocardiogram Supportive care  All the records are reviewed and case discussed with ED provider. Management plans discussed with the patient, family and they are in agreement.  CODE STATUS:FULL CODE Surrogate decision maker : daughter Code Status History    This patient does not have a recorded code status. Please follow your organizational policy for patients in this situation.       TOTAL TIME TAKING CARE OF THIS PATIENT: 53 minutes.    Saundra Shelling M.D on 03/07/2017 at 4:12 AM  Between 7am to 6pm - Pager - [phone]  After 6pm go to [email or web address] - password [email or web address] [facility]  [facility]  Office  [phone]  CC: Primary care physician; No primary care provider on file.

## 2017-03-08 ENCOUNTER — Inpatient Hospital Stay (HOSPITAL_COMMUNITY)
Admit: 2017-03-08 | Discharge: 2017-03-08 | Disposition: A | Discharge status: Home / Self Care | Payer: Medicare Other | Attending: Internal Medicine | Admitting: Internal Medicine

## 2017-03-08 DIAGNOSIS — I340 Nonrheumatic mitral (valve) insufficiency: Secondary | ICD-10-CM

## 2017-03-08 DIAGNOSIS — I5031 Acute diastolic (congestive) heart failure: Secondary | ICD-10-CM

## 2017-03-08 DIAGNOSIS — J441 Chronic obstructive pulmonary disease with (acute) exacerbation: Secondary | ICD-10-CM

## 2017-03-08 DIAGNOSIS — I255 Ischemic cardiomyopathy: Secondary | ICD-10-CM

## 2017-03-08 LAB — BASIC METABOLIC PANEL
Anion gap: 8 (ref 5–15)
BUN: 50 mg/dL — ABNORMAL HIGH (ref 6–20)
CO2: 26 mmol/L (ref 22–32)
CREATININE: 1.42 mg/dL — AB (ref 0.44–1.00)
Calcium: 8.6 mg/dL — ABNORMAL LOW (ref 8.9–10.3)
Chloride: 108 mmol/L (ref 101–111)
GFR calc Af Amer: 41 mL/min — ABNORMAL LOW (ref >60)
GFR, EST NON AFRICAN AMERICAN: 36 mL/min — AB (ref >60)
Glucose, Bld: 181 mg/dL — ABNORMAL HIGH (ref 65–99)
Potassium: 4.5 mmol/L (ref 3.5–5.1)
SODIUM: 142 mmol/L (ref 135–145)

## 2017-03-08 LAB — ECHOCARDIOGRAM COMPLETE
HEIGHTINCHES: 55 in
Weight: 2990.4 oz

## 2017-03-08 LAB — GLUCOSE, CAPILLARY
GLUCOSE-CAPILLARY: 165 mg/dL — AB (ref 65–99)
Glucose-Capillary: 222 mg/dL — ABNORMAL HIGH (ref 65–99)
Glucose-Capillary: 198 mg/dL — ABNORMAL HIGH (ref 65–99)
Glucose-Capillary: 211 mg/dL — ABNORMAL HIGH (ref 65–99)

## 2017-03-08 MED ORDER — SODIUM CHLORIDE 0.9% FLUSH: 3.0000 mL | INTRAVENOUS | Status: DC | PRN

## 2017-03-08 MED ORDER — POTASSIUM CHLORIDE CRYS ER 20 MEQ PO TBCR
EXTENDED_RELEASE_TABLET | ORAL | Status: AC
  Filled 2017-03-08: qty 1

## 2017-03-08 MED ORDER — METHYLPREDNISOLONE SODIUM SUCC 40 MG IJ SOLR
40.0000 mg | Freq: Two times a day (BID) | INTRAMUSCULAR | Status: DC
  Administered 2017-03-08 – 2017-03-09 (×2): 40 mg via INTRAVENOUS
  Filled 2017-03-08 (×2): qty 1

## 2017-03-08 MED ORDER — DIPHENHYDRAMINE HCL 25 MG PO CAPS
25.0000 mg | ORAL_CAPSULE | ORAL | Status: AC
  Administered 2017-03-09: 25 mg via ORAL
  Filled 2017-03-08: qty 1

## 2017-03-08 MED ORDER — SODIUM CHLORIDE 0.9 % IV SOLN: 250.0000 mL | INTRAVENOUS | Status: DC | PRN

## 2017-03-08 MED ORDER — SODIUM CHLORIDE 0.9% FLUSH
3.0000 mL | Freq: Two times a day (BID) | INTRAVENOUS | Status: DC
  Administered 2017-03-08 – 2017-03-09 (×2): 3 mL via INTRAVENOUS

## 2017-03-08 MED ORDER — ENOXAPARIN SODIUM 40 MG/0.4ML ~~LOC~~ SOLN
SUBCUTANEOUS | Status: AC
  Filled 2017-03-08: qty 0.4

## 2017-03-08 MED ORDER — METHYLPREDNISOLONE SODIUM SUCC 125 MG IJ SOLR
INTRAMUSCULAR | Status: AC
  Filled 2017-03-08: qty 2

## 2017-03-08 MED ORDER — ASPIRIN EC 81 MG PO TBEC
DELAYED_RELEASE_TABLET | ORAL | Status: AC
  Filled 2017-03-08: qty 1

## 2017-03-08 MED ORDER — ASPIRIN 81 MG PO CHEW
81.0000 mg | CHEWABLE_TABLET | ORAL | Status: AC
  Administered 2017-03-09: 81 mg via ORAL
  Filled 2017-03-08: qty 1

## 2017-03-08 MED ORDER — AMLODIPINE BESYLATE 10 MG PO TABS
ORAL_TABLET | ORAL | Status: AC
  Filled 2017-03-08: qty 1

## 2017-03-08 MED ORDER — INSULIN ASPART 100 UNIT/ML ~~LOC~~ SOLN
SUBCUTANEOUS | Status: AC
  Filled 2017-03-08: qty 1

## 2017-03-08 MED ORDER — MONTELUKAST SODIUM 10 MG PO TABS
10.0000 mg | ORAL_TABLET | Freq: Every day | ORAL | Status: DC
  Administered 2017-03-08 – 2017-03-10 (×3): 10 mg via ORAL
  Filled 2017-03-08 (×3): qty 1

## 2017-03-08 MED ORDER — IPRATROPIUM-ALBUTEROL 0.5-2.5 (3) MG/3ML IN SOLN
3.0000 mL | RESPIRATORY_TRACT | Status: DC | PRN
  Administered 2017-03-08 – 2017-03-10 (×7): 3 mL via RESPIRATORY_TRACT
  Filled 2017-03-08 (×8): qty 3

## 2017-03-08 MED ORDER — FAMOTIDINE 20 MG PO TABS
20.0000 mg | ORAL_TABLET | ORAL | Status: AC
  Administered 2017-03-09: 20 mg via ORAL
  Filled 2017-03-08: qty 1

## 2017-03-08 MED ORDER — IPRATROPIUM-ALBUTEROL 0.5-2.5 (3) MG/3ML IN SOLN: 3.0000 mL | Freq: Four times a day (QID) | RESPIRATORY_TRACT | Status: DC

## 2017-03-08 MED ORDER — SODIUM CHLORIDE 0.9 % IV SOLN
INTRAVENOUS | Status: AC
  Administered 2017-03-09 (×2): via INTRAVENOUS

## 2017-03-08 NOTE — Progress Notes (Signed)
Inpatient Diabetes Program Recommendations  AACE/ADA: New Consensus Statement on Inpatient Glycemic Control (2015)  Target Ranges:  Prepandial:   less than 140 mg/dL      Peak postprandial:   less than 180 mg/dL (1-2 hours)      Critically ill patients:  140 - 180 mg/dL   Lab Results  Component Value Date   GLUCAP 165 (H) 03/08/2017    Review of Glycemic Control  Results for TIEARA, FLITTON (MRN [id number]) as of 03/08/2017 13:22  Ref. Range 03/07/2017 11:42 03/07/2017 16:56 03/07/2017 20:50 03/08/2017 09:04 03/08/2017 11:41  Glucose-Capillary Latest Ref Range: 65 - 99 mg/dL 242 (H) 136 (H) 252 (H) 165 (H) 222 (H)    Diabetes history: Type 2 Outpatient Diabetes medications: Metformin 850mg  bid  Current orders for Inpatient glycemic control: Novolog 0-15 units tid, Novolog 0-5 units qhs  * Solu-medrol 60mg  q6h  Inpatient Diabetes Program Recommendations:  Per ADA recommendations "consider performing an A1C on all patients with diabetes or hyperglycemia admitted to the hospital if not performed in the prior 3 months".  Consider adding Novolog 3 units tid with meals (hold if patient eats less than 50%, taper as steroids are decreased)  Spoke with patient at the bedside regarding elevated CBG with steroids and the use of insulin while inpatient- patient verbalized understanding since she has previously been in the hospital with steroids.  She does not check blood sugars at home because "they're always good"- strongly encouraged to check CBG at least 2 times a day once she goes home until she sees the blood sugars go back to normal as the steroids are tapered.    Gentry Fitz, RN, BA, MHA, CDE Diabetes Coordinator Inpatient Diabetes Program  [phone] (Team Pager) [phone] ([place]) 03/08/2017 9:41 AM

## 2017-03-08 NOTE — Progress Notes (Signed)
Progress Note  Patient Name: Angela Pham Date of Encounter: 03/08/2017  Primary Cardiologist: New - M. Fletcher Anon, MD   Subjective   No chest pain.  Breathing somewhat better this am.  She is concerned that albuterol under dosed (uses 5mg  @ home, not 2.5).  Also uses duobenb @ home.  Inpatient Medications    Scheduled Meds: . albuterol  2.5 mg Nebulization Once   Followed by  . albuterol  2.5 mg Nebulization Once  . amLODipine  10 mg Oral Daily  . aspirin EC  81 mg Oral Daily  . atorvastatin  40 mg Oral q1800  . carvedilol  3.125 mg Oral BID WC  . enoxaparin (LOVENOX) injection  40 mg Subcutaneous Q24H  . insulin aspart  0-15 Units Subcutaneous TID WC  . insulin aspart  0-5 Units Subcutaneous QHS  . mouth rinse  15 mL Mouth Rinse BID  . methylPREDNISolone (SOLU-MEDROL) injection  60 mg Intravenous Q6H  . mirtazapine  45 mg Oral QHS  . mometasone-formoterol  2 puff Inhalation BID  . potassium chloride  20 mEq Oral Daily  . sodium chloride flush  3 mL Intravenous Q12H   Continuous Infusions: . sodium chloride     PRN Meds: sodium chloride, acetaminophen, albuterol, ipratropium, lactulose, ondansetron (ZOFRAN) IV, sodium chloride flush, tetrahydrozoline   Vital Signs    Vitals:   03/07/17 1935 03/07/17 2341 03/08/17 0414 03/08/17 0441  BP: 137/65  138/70   Pulse: (!) 106  (!) 107   Resp: 18  16   Temp: 97.7 F (36.5 C)  97.6 F (36.4 C)   TempSrc: Oral  Oral   SpO2: 99% 99% 98% 97%  Weight:   186 lb 14.4 oz (84.8 kg)   Height:        Intake/Output Summary (Last 24 hours) at 03/08/17 0841 Last data filed at 03/07/17 1830  Gross per 24 hour  Intake             1080 ml  Output                0 ml  Net             1080 ml   Filed Weights   03/07/17 0222 03/08/17 0414  Weight: 171 lb (77.6 kg) 186 lb 14.4 oz (84.8 kg)    Physical Exam   GEN: obese, in no acute distress.  HEENT: Grossly normal.  Neck: Supple, obese, difficult to gauge jvp.  No carotid bruits,  or masses. Cardiac: RRR, tachy, distant, no murmurs, rubs, or gallops. No clubbing, cyanosis, trace bilat ankle edema.  Radials/DP/PT 2+ and equal bilaterally.  Respiratory:  Respirations regular and unlabored, diminished breath sounds bilat, coarse rhonchi anteriorly.  GI: Soft, nontender, nondistended, BS + x 4. MS: no deformity or atrophy. Skin: warm and dry, no rash. Neuro:  Strength and sensation are intact. Psych: AAOx3.  Normal affect.  Labs    Chemistry Recent Labs Lab 03/07/17 0227 03/08/17 0411  NA 141 142  K 4.2 4.5  CL 108 108  CO2 25 26  GLUCOSE 181* 181*  BUN 40* 50*  CREATININE 1.21* 1.42*  CALCIUM 9.0 8.6*  PROT 7.2  --   ALBUMIN 3.5  --   AST 22  --   ALT 18  --   ALKPHOS 62  --   BILITOT 0.4  --   GFRNONAA 43* 36*  GFRAA 50* 41*  ANIONGAP 8 8     Hematology Recent Labs Lab  03/07/17 0227  WBC 15.2*  RBC 3.76*  HGB 11.4*  HCT 34.1*  MCV 90.7  MCH 30.3  MCHC 33.4  RDW 14.7*  PLT 300    Cardiac Enzymes Recent Labs Lab 03/07/17 0227 03/07/17 0831 03/07/17 1417 03/07/17 2025  TROPONINI 0.03* 0.10* 0.15* 0.17*   D dimer 746.57  Radiology    Dg Chest Portable 1 View  Result Date: 03/07/2017 CLINICAL DATA:  Shortness of breath EXAM: PORTABLE CHEST 1 VIEW COMPARISON:  None. FINDINGS: Cardiomegaly and with mild central congestion and mild perihilar interstitial opacities suggestive of mild edema. No effusion or consolidation. Aortic atherosclerosis. No pneumothorax. IMPRESSION: Cardiomegaly with mild central congestion and mild diffuse interstitial opacities suggestive of mild edema. Electronically Signed   By: Donavan Foil M.D.   On: 03/07/2017 02:47    Telemetry    Sinus tachycardia, occas pvc - Personally Reviewed  Cardiac Studies   Echo pending  Patient Profile     76 y.o. female with a h/o asthma, copd, obesity, HTN, HL, and DM, admitted 6/10 with acute dyspnea and mild CHF.  Assessment & Plan    1.  Acute CHF/Pulmonary  Edema:  ? Systolic vs diastolic.  Ongoing sinus tachycardia concerning for possible low output.  Echo ordered 6/11 - still pending.  She has been receiving low dose IV lasix.  BUN/Creat/bicarb bumping - 50/1.42/26 this am.  Breathing stable.  Lower ext edema much improved.  Hold lasix this am.  If EF down by echo, will plan on diagnostic cath tomorrow am - will want renal fxn to stabilize first.  If EF nl, can plan on myoview, but would prefer that she's moving air a little better prior to giving lexiscan.  I'm going to hold her coreg as it may be contributing to asthma/copd issues.  Pending EF, can consider bisoprolol instead.  2.  Chest tightness/Elevated troponin/Elevated d dimer:  Pt reports a h/o chest tightness in the setting of asthma flares in the past.  She did have similar tightness with wheezing prior to admission.  Troponin is mildly elevated w/ a flat trend 0.03  0.10  0.15  0.17.  No recurrent c/p or tightness.  D dimer also moderately elevated.  Will eval RV w/ echo and have low threshold to add heparin and check CTA if RV down/PASP elevated.  As above, pending echo/LV fxn, we will make a decision re: cath vs myoview - most likely to take place tomorrow.  Cont asa/statin.  3.  Essential HTN:  BP mildly elevated.  Hold  blocker given resp status/asthma flare.  Holding lasix in setting of rising bun/creat.  She is on losartan @ home.  Will plan to resume in am provided that renal fxn stable.  4.  HL:  Cont statin.  F/u lipids as outpt.  5.  DM II:  Per IM,.  6. AECOPD/Ashtma/Acute resp failure:  Inhalers/nebs/steroids per IM.  It sounds like she uses frequent, high doses of rescue inhalers/nebs @ home.  She would greatly benefit from pulmonology evaluation.  Will also need outpt sleep study.  Signed, Murray Hodgkins, NP  03/08/2017, 8:41 AM

## 2017-03-08 NOTE — Discharge Instructions (Signed)
Heart Failure Clinic appointment on March 21, 2017 at 9:20am with Darylene Price, [place]. Please call [phone] to reschedule.

## 2017-03-08 NOTE — Progress Notes (Signed)
[place] at [place] NAME: Keren Alverio    MR#:  [id number]  DATE OF BIRTH:  10-22-1940  SUBJECTIVE:  Patient with wheezing this am  REVIEW OF SYSTEMS:    Review of Systems  Constitutional: Negative for fever, chills weight loss HENT: Negative for ear pain, nosebleeds, congestion, facial swelling, rhinorrhea, neck pain, neck stiffness and ear discharge.   Respiratory: Negative for cough, ++shortness of breath, wheezing  Cardiovascular: Negative for chest pain, palpitations and ++improved leg swelling.  Gastrointestinal: Negative for heartburn, abdominal pain, vomiting, diarrhea or consitpation Genitourinary: Negative for dysuria, urgency, frequency, hematuria Musculoskeletal: Negative for back pain or joint pain Neurological: Negative for dizziness, seizures, syncope, focal weakness,  numbness and headaches.  Hematological: Does not bruise/bleed easily.  Psychiatric/Behavioral: Negative for hallucinations, confusion, dysphoric mood    Tolerating Diet: yes      DRUG ALLERGIES:   Allergies  Allergen Reactions  . Shellfish Allergy Hives and Swelling    Swelling around face and mouth     VITALS:  Blood pressure (!) 143/78, pulse (!) 107, temperature 97.9 F (36.6 C), temperature source Oral, resp. rate 18, height 4\' 7"  (1.397 m), weight 84.8 kg (186 lb 14.4 oz), SpO2 96 %.  PHYSICAL EXAMINATION:  Constitutional: Mildly obese No distress. HENT: Normocephalic. Marland Kitchen Oropharynx is clear and moist.  Eyes: Conjunctivae and EOM are normal. PERRLA, no scleral icterus.  Neck: Normal ROM. Neck supple. No JVD. No tracheal deviation. CVS: RRR, S1/S2 +, no murmurs, no gallops, no carotid bruit.  Pulmonary: Effort and breath sounds normal, bilateral expiratory wheezing Abdominal: Soft. BS +,  no distension, tenderness, rebound or guarding.  Musculoskeletal: Normal range of motion. Mild pitting edema and no tenderness.  Neuro: Alert. CN 2-12  grossly intact. No focal deficits. Skin: Skin is warm and dry. No rash noted. Psychiatric: Normal mood and affect.      LABORATORY PANEL:   CBC  Recent Labs Lab 03/07/17 0227  WBC 15.2*  HGB 11.4*  HCT 34.1*  PLT 300   ------------------------------------------------------------------------------------------------------------------  Chemistries   Recent Labs Lab 03/07/17 0227 03/08/17 0411  NA 141 142  K 4.2 4.5  CL 108 108  CO2 25 26  GLUCOSE 181* 181*  BUN 40* 50*  CREATININE 1.21* 1.42*  CALCIUM 9.0 8.6*  AST 22  --   ALT 18  --   ALKPHOS 62  --   BILITOT 0.4  --    ------------------------------------------------------------------------------------------------------------------  Cardiac Enzymes  Recent Labs Lab 03/07/17 0831 03/07/17 1417 03/07/17 2025  TROPONINI 0.10* 0.15* 0.17*   ------------------------------------------------------------------------------------------------------------------  RADIOLOGY:  Dg Chest Portable 1 View  Result Date: 03/07/2017 CLINICAL DATA:  Shortness of breath EXAM: PORTABLE CHEST 1 VIEW COMPARISON:  None. FINDINGS: Cardiomegaly and with mild central congestion and mild perihilar interstitial opacities suggestive of mild edema. No effusion or consolidation. Aortic atherosclerosis. No pneumothorax. IMPRESSION: Cardiomegaly with mild central congestion and mild diffuse interstitial opacities suggestive of mild edema. Electronically Signed   By: Donavan Foil M.D.   On: 03/07/2017 02:47     ASSESSMENT AND PLAN:    76 year old female with mild intermittent asthma who presents with shortness of breath and found to have new CHF.  1. Acute hypoxic respiratory failure in the setting of new onset CHF exacerbation and asthma exacerbation Wean oxygen as tolerated Consider CT chest if oxygen is not being able to be weaned and results of echo   2. Acute CHF exacerbation: Echocardiogram pending for a.m. Due  to increasing  creatinine Lasix has been discontinued Appreciate cardiology consultation Further recommendations after echocardiogram read. Discontinue Coreg due to concerns that may be contributing to asthma exacerbation.  3. Acute exacerbation of asthma: Continue IV steroids, DuoNeb's and inhaler  4. Diabetes: Continue sliding scale insulin  5. Hyperlipidemia: Continue atorvastatin  6. Essential hypertension: Blood pressure is acceptable. Coreg discontinued due to problem #3 Losartan discontinued for now due to slightly increased creatinine this morning  7. Depression/: Continue Remeron 8. Morbid obesity: Advised weight loss tolerated.  Management plans discussed with the patient and she is in agreement.  CODE STATUS: full  TOTAL TIME TAKING CARE OF THIS PATIENT: 25 minutes.     POSSIBLE D/C 1-2 days, DEPENDING ON CLINICAL CONDITION.   Zoie Sarin M.D on 03/08/2017 at 10:31 AM  Between 7am to 6pm - Pager - [phone] After 6pm go to [email or web address] - password [email or web address] [place] [facility]  Office  [phone]  CC: Primary care physician; Center, [facility]  Note: This dictation was prepared with Dragon dictation along with smaller phrase technology. Any transcriptional errors that result from this process are unintentional.

## 2017-03-08 NOTE — Progress Notes (Signed)
*  PRELIMINARY RESULTS* Echocardiogram 2D Echocardiogram has been performed.  Angela Pham 03/08/2017, 11:04 AM

## 2017-03-08 NOTE — Progress Notes (Signed)
Initial HF clinic appointment scheduled for March 21, 2017 at 9:20am. Thank you for the referral.

## 2017-03-08 NOTE — Care Management (Signed)
Discussed the need for home 02 assessment during progression

## 2017-03-08 NOTE — Evaluation (Signed)
Physical Therapy Evaluation Patient Details Name: Angela Pham MRN: [id number] DOB: 10-13-1940 Today's Date: 03/08/2017   History of Present Illness  76 y.o. female with a known history of Bronchial asthma, diabetes mellitus, hyperlipidemia, hypertension presented to the emergency room with shortness of breath since last few weeks. Patient has a wheezing as well as swelling in both the legs. She has been retaining fluid and has orthopnea. She felt wheezy and tried some breathing treatments.    Clinical Impression  Pt did well with all mobility and ambulation.  She was on 2 liters on arrival and sats were 98%, but despite some fatigue she was in the 90s the entire time on room air (spoke with nursing who agrees with keeping her on room air).  Pt confident with physical ability to return home but focused on breathing treatments/inhalers.  No further PT needs.     Follow Up Recommendations No PT follow up    Equipment Recommendations       Recommendations for Other Services       Precautions / Restrictions Restrictions Weight Bearing Restrictions: No      Mobility  Bed Mobility Overal bed mobility: Independent             General bed mobility comments: easily able to get to sitting at EOB  Transfers Overall transfer level: Independent Equipment used: None             General transfer comment: Pt stands with good confidence and safety  Ambulation/Gait Ambulation/Gait assistance: Independent Ambulation Distance (Feet): 200 Feet Assistive device: Rolling walker (2 wheeled)       General Gait Details: Pt with consistent and confident cadence and generally is near her baseline.  She was on room air t/o the effort of ambulaiton and sats stayed in the 90s (dropped as low as 90%, quickly came back up to 96% with rest)   Stairs            Wheelchair Mobility    Modified Rankin (Stroke Patients Only)       Balance Overall balance assessment: Independent                                           Pertinent Vitals/Pain Pain Assessment: No/denies pain    Home Living Family/patient expects to be discharged to:: Private residence Living Arrangements: Other relatives (niece) Available Help at Discharge: Family Type of Home: House Home Access: Stairs to enter              Prior Function Level of Independence: Independent         Comments: Pt report she is able to stay active, generally can go out into community, Health visitor Dominance        Extremity/Trunk Assessment   Upper Extremity Assessment Upper Extremity Assessment: Overall WFL for tasks assessed    Lower Extremity Assessment Lower Extremity Assessment: Overall WFL for tasks assessed       Communication   Communication: No difficulties  Cognition Arousal/Alertness: Awake/alert Behavior During Therapy: WFL for tasks assessed/performed Overall Cognitive Status: Within Functional Limits for tasks assessed                                        General Comments  Exercises     Assessment/Plan    PT Assessment Patent does not need any further PT services  PT Problem List         PT Treatment Interventions      PT Goals (Current goals can be found in the Care Plan section)  Acute Rehab PT Goals Patient Stated Goal: go home PT Goal Formulation: All assessment and education complete, DC therapy    Frequency     Barriers to discharge        Co-evaluation               AM-PAC PT "6 Clicks" Daily Activity  Outcome Measure Difficulty turning over in bed (including adjusting bedclothes, sheets and blankets)?: None Difficulty moving from lying on back to sitting on the side of the bed? : None Difficulty sitting down on and standing up from a chair with arms (e.g., wheelchair, bedside commode, etc,.)?: None Help needed moving to and from a bed to chair (including a wheelchair)?: None Help needed walking in  hospital room?: None Help needed climbing 3-5 steps with a railing? : None 6 Click Score: 24    End of Session Equipment Utilized During Treatment: Gait belt Activity Tolerance: Patient tolerated treatment well Patient left: in chair;with call bell/phone within reach Nurse Communication: Mobility status PT Visit Diagnosis: Muscle weakness (generalized) (M62.81)    Time: 5825-1898 PT Time Calculation (min) (ACUTE ONLY): 20 min   Charges:   PT Evaluation $PT Eval Low Complexity: 1 Procedure     PT G CodesKreg Shropshire, DPT 03/08/2017, 3:54 PM

## 2017-03-09 ENCOUNTER — Encounter
Admission: EM | Disposition: A | Discharge status: Home / Self Care | Payer: Self-pay | Source: Home / Self Care | Attending: Internal Medicine

## 2017-03-09 ENCOUNTER — Encounter: Payer: Self-pay | Admitting: Cardiovascular Disease

## 2017-03-09 DIAGNOSIS — R0902 Hypoxemia: Secondary | ICD-10-CM

## 2017-03-09 DIAGNOSIS — I5033 Acute on chronic diastolic (congestive) heart failure: Secondary | ICD-10-CM

## 2017-03-09 DIAGNOSIS — R0602 Shortness of breath: Secondary | ICD-10-CM

## 2017-03-09 HISTORY — PX: RIGHT/LEFT HEART CATH AND CORONARY ANGIOGRAPHY: CATH118266

## 2017-03-09 LAB — BASIC METABOLIC PANEL
Anion gap: 10 (ref 5–15)
BUN: 48 mg/dL — AB (ref 6–20)
CHLORIDE: 108 mmol/L (ref 101–111)
CO2: 24 mmol/L (ref 22–32)
Calcium: 9 mg/dL (ref 8.9–10.3)
Creatinine, Ser: 1.24 mg/dL — ABNORMAL HIGH (ref 0.44–1.00)
GFR calc Af Amer: 49 mL/min — ABNORMAL LOW (ref >60)
GFR calc non Af Amer: 42 mL/min — ABNORMAL LOW (ref >60)
GLUCOSE: 167 mg/dL — AB (ref 65–99)
POTASSIUM: 4.5 mmol/L (ref 3.5–5.1)
SODIUM: 142 mmol/L (ref 135–145)

## 2017-03-09 LAB — PROTIME-INR
INR: 0.92
PROTHROMBIN TIME: 12.4 s (ref 11.4–15.2)

## 2017-03-09 LAB — GLUCOSE, CAPILLARY
GLUCOSE-CAPILLARY: 275 mg/dL — AB (ref 65–99)
GLUCOSE-CAPILLARY: 103 mg/dL — AB (ref 65–99)
Glucose-Capillary: 162 mg/dL — ABNORMAL HIGH (ref 65–99)
Glucose-Capillary: 147 mg/dL — ABNORMAL HIGH (ref 65–99)

## 2017-03-09 LAB — TSH: TSH: 0.659 u[IU]/mL (ref 0.350–4.500)

## 2017-03-09 LAB — MAGNESIUM: MAGNESIUM: 1.7 mg/dL (ref 1.7–2.4)

## 2017-03-09 SURGERY — RIGHT/LEFT HEART CATH AND CORONARY ANGIOGRAPHY
Anesthesia: Moderate Sedation

## 2017-03-09 MED ORDER — INSULIN ASPART 100 UNIT/ML ~~LOC~~ SOLN
3.0000 [IU] | Freq: Three times a day (TID) | SUBCUTANEOUS | Status: DC
  Administered 2017-03-09 – 2017-03-11 (×7): 3 [IU] via SUBCUTANEOUS
  Filled 2017-03-09 (×7): qty 1

## 2017-03-09 MED ORDER — VERAPAMIL HCL ER 240 MG PO TBCR
240.0000 mg | EXTENDED_RELEASE_TABLET | Freq: Every day | ORAL | Status: DC
  Administered 2017-03-09 – 2017-03-10 (×2): 240 mg via ORAL
  Filled 2017-03-09 (×3): qty 1

## 2017-03-09 MED ORDER — IOPAMIDOL (ISOVUE-300) INJECTION 61%
INTRAVENOUS | Status: DC | PRN
  Administered 2017-03-09: 75 mL via INTRA_ARTERIAL

## 2017-03-09 MED ORDER — ONDANSETRON HCL 4 MG/2ML IJ SOLN: 4.0000 mg | Freq: Four times a day (QID) | INTRAMUSCULAR | Status: DC | PRN

## 2017-03-09 MED ORDER — SODIUM CHLORIDE 0.9% FLUSH
3.0000 mL | Freq: Two times a day (BID) | INTRAVENOUS | Status: DC
  Administered 2017-03-09 – 2017-03-11 (×4): 3 mL via INTRAVENOUS

## 2017-03-09 MED ORDER — METHYLPREDNISOLONE SODIUM SUCC 40 MG IJ SOLR
40.0000 mg | INTRAMUSCULAR | Status: DC
  Administered 2017-03-10: 40 mg via INTRAVENOUS
  Filled 2017-03-09: qty 1

## 2017-03-09 MED ORDER — SODIUM CHLORIDE 0.9 % IV SOLN: 250.0000 mL | INTRAVENOUS | Status: DC | PRN

## 2017-03-09 MED ORDER — FENTANYL CITRATE (PF) 100 MCG/2ML IJ SOLN
INTRAMUSCULAR | Status: DC | PRN
  Administered 2017-03-09: 25 ug via INTRAVENOUS

## 2017-03-09 MED ORDER — FENTANYL CITRATE (PF) 100 MCG/2ML IJ SOLN
INTRAMUSCULAR | Status: AC
  Filled 2017-03-09: qty 2

## 2017-03-09 MED ORDER — SODIUM CHLORIDE 0.9% FLUSH: 3.0000 mL | INTRAVENOUS | Status: DC | PRN

## 2017-03-09 MED ORDER — MIDAZOLAM HCL 2 MG/2ML IJ SOLN
INTRAMUSCULAR | Status: DC | PRN
  Administered 2017-03-09: 1 mg via INTRAVENOUS

## 2017-03-09 MED ORDER — MIDAZOLAM HCL 2 MG/2ML IJ SOLN
INTRAMUSCULAR | Status: AC
  Filled 2017-03-09: qty 2

## 2017-03-09 MED ORDER — ACETAMINOPHEN 325 MG PO TABS: 650.0000 mg | ORAL_TABLET | ORAL | Status: DC | PRN

## 2017-03-09 SURGICAL SUPPLY — 13 items
CATH INFINITI 5FR JL4 (CATHETERS) ×3 IMPLANT
CATH INFINITI JR4 5F (CATHETERS) ×3 IMPLANT
CATH SWANZ 7F THERMO (CATHETERS) ×3 IMPLANT
DEVICE CLOSURE MYNXGRIP 5F (Vascular Products) ×3 IMPLANT
GUIDEWIRE EMER 3M J .025X150CM (WIRE) ×3 IMPLANT
KIT MANI 3VAL PERCEP (MISCELLANEOUS) ×3 IMPLANT
KIT RIGHT HEART (MISCELLANEOUS) ×3 IMPLANT
NEEDLE PERC 18GX7CM (NEEDLE) ×3 IMPLANT
NEEDLE SMART 18G ACCESS (NEEDLE) ×3 IMPLANT
PACK CARDIAC CATH (CUSTOM PROCEDURE TRAY) ×3 IMPLANT
SHEATH AVANTI 5FR X 11CM (SHEATH) ×3 IMPLANT
SHEATH PINNACLE 7F 10CM (SHEATH) ×3 IMPLANT
WIRE EMERALD 3MM-J .035X150CM (WIRE) ×3 IMPLANT

## 2017-03-09 NOTE — Progress Notes (Signed)
Pt remains clinically stable post heart cath. Alert and oriented. Denies complaints at this time. Report called to [facility] RN on telemetry with plan reviewed, right groin without bleeding nor hematoma.

## 2017-03-09 NOTE — Progress Notes (Signed)
Cardiac cath  Normal coronary arteries  Right heart pressures: RA: mean 22 (elevated) RV: 53/18/22 (elevated)  RVEDP: 22 (elevated) LVEDP 38-43 (markedly elevated) Unable to advance catheter to wedge pressure despite multiple attempts   Recommendations:  Normal coronary arteries Wall motion secondary to LBBB Markedly elevated LVEDP Elevated right atrial pressures Numbers consistent with chronic diastolic CHF  Blood pressure markedly elevated , tachycardic Will D/c amlodipine, start verapamil to assist with rate control  Consider restarting lasix tomorrow AM after review of labs, given contrast load today  Signed, Esmond Plants, MD, Ph.D [facility]

## 2017-03-09 NOTE — Care Management (Addendum)
Cardiac cath showed normal coronary arteries.  Findings consistent with chronic diastolic CHF.  Has an appointment for the [facility].  has Information packet for heart failure but has not started reading it.  Confirms she has access to scales.  Has not qualified for home 02 at present

## 2017-03-09 NOTE — Progress Notes (Signed)
Patient Name: Angela Pham Date of Encounter: 03/09/2017  Primary Cardiologist: New - consult by [facility] Problem List     Active Problems:   Pulmonary edema   CHF (congestive heart failure) (HCC)     Subjective   No chest pain. Breathing continues to improved. LE edema improved. She continues to use frequent albuterol nebulizers. Renal function improved from 1.4-->1.2. UOP documented at + 1.8 L for the admission. Weight 185 this morning. Is for [facility] this morning. Has allergy to contrast/shellfish, Benadryl and Pepcid on call to the cath lab. On steroids for AECOPD.   Inpatient Medications    Scheduled Meds: . amLODipine  10 mg Oral Daily  . aspirin EC  81 mg Oral Daily  . atorvastatin  40 mg Oral q1800  . diphenhydrAMINE  25 mg Oral On Call  . enoxaparin (LOVENOX) injection  40 mg Subcutaneous Q24H  . famotidine  20 mg Oral On Call  . insulin aspart  0-15 Units Subcutaneous TID WC  . insulin aspart  0-5 Units Subcutaneous QHS  . mouth rinse  15 mL Mouth Rinse BID  . methylPREDNISolone (SOLU-MEDROL) injection  40 mg Intravenous Q12H  . mirtazapine  45 mg Oral QHS  . mometasone-formoterol  2 puff Inhalation BID  . montelukast  10 mg Oral QHS  . potassium chloride  20 mEq Oral Daily  . sodium chloride flush  3 mL Intravenous Q12H  . sodium chloride flush  3 mL Intravenous Q12H   Continuous Infusions: . sodium chloride    . sodium chloride    . sodium chloride 50 mL/hr at 03/09/17 0300   PRN Meds: sodium chloride, sodium chloride, acetaminophen, albuterol, ipratropium-albuterol, lactulose, ondansetron (ZOFRAN) IV, sodium chloride flush, sodium chloride flush, tetrahydrozoline   Vital Signs    Vitals:   03/08/17 1501 03/08/17 1930 03/08/17 2028 03/09/17 0334  BP:  (!) 158/82  (!) 148/66  Pulse:  (!) 116  (!) 115  Resp:  16  16  Temp:  97.8 F (36.6 C)  98 F (36.7 C)  TempSrc:  Oral  Oral  SpO2: 96% 96% 95% 93%  Weight:    185 lb 6.4 oz (84.1 kg)    Height:        Intake/Output Summary (Last 24 hours) at 03/09/17 0724 Last data filed at 03/09/17 0622  Gross per 24 hour  Intake           888.33 ml  Output                0 ml  Net           888.33 ml   Filed Weights   03/07/17 0222 03/08/17 0414 03/09/17 0334  Weight: 171 lb (77.6 kg) 186 lb 14.4 oz (84.8 kg) 185 lb 6.4 oz (84.1 kg)    Physical Exam    GEN: Well nourished, well developed, in no acute distress.  HEENT: Grossly normal.  Neck: Supple, JVD difficult to assess 2/2 body habitus, no carotid bruits, or masses. Cardiac: Mildly tachycardic, distant, no murmurs, rubs, or gallops. No clubbing, cyanosis, trace bilateral ankle edema.  Radials/DP/PT 2+ and equal bilaterally.  Respiratory:  Respirations regular and unlabored, clear to auscultation bilaterally. GI: Soft, nontender, nondistended, BS + x 4. MS: no deformity or atrophy. Skin: warm and dry, no rash. Neuro:  Strength and sensation are intact. Psych: AAOx3.  Normal affect.  Labs    CBC  Recent Labs  03/07/17 0227  WBC 15.2*  HGB 11.4*  HCT 34.1*  MCV 90.7  PLT 161   Basic Metabolic Panel  Recent Labs  03/08/17 0411 03/09/17 0415  NA 142 142  K 4.5 4.5  CL 108 108  CO2 26 24  GLUCOSE 181* 167*  BUN 50* 48*  CREATININE 1.42* 1.24*  CALCIUM 8.6* 9.0   Liver Function Tests  Recent Labs  03/07/17 0227  AST 22  ALT 18  ALKPHOS 62  BILITOT 0.4  PROT 7.2  ALBUMIN 3.5   No results for input(s): LIPASE, AMYLASE in the last 72 hours. Cardiac Enzymes  Recent Labs  03/07/17 0831 03/07/17 1417 03/07/17 2025  TROPONINI 0.10* 0.15* 0.17*   BNP Invalid input(s): POCBNP D-Dimer No results for input(s): DDIMER in the last 72 hours. Hemoglobin A1C No results for input(s): HGBA1C in the last 72 hours. Fasting Lipid Panel No results for input(s): CHOL, HDL, LDLCALC, TRIG, CHOLHDL, LDLDIRECT in the last 72 hours. Thyroid Function Tests No results for input(s): TSH, T4TOTAL, T3FREE,  THYROIDAB in the last 72 hours.  Invalid input(s): FREET3  Telemetry    Sinus tachycardia with occasional PVCs, short runs of atrial tach, rates ranging from low 100s to 120s bpm - Personally Reviewed  ECG    n/a - Personally Reviewed  Radiology    No results found.  Cardiac Studies   TTE/03/08/17: Study Conclusions  - Left ventricle: The cavity size was at the upper limits of   normal. Wall thickness was increased in a pattern of mild LVH.   Systolic function was mildly reduced. The estimated ejection   fraction was in the range of 45% to 50%. There is hypokinesis of   the basal-midanteroseptal myocardium. - Aortic valve: Poorly visualized. A bicuspid morphology cannot be   excluded. There was mild regurgitation. - Mitral valve: Calcified annulus. Mildly thickened leaflets .   Leaflet separation was mildly reduced. There was moderate   regurgitation directed posteriorly. - Left atrium: The atrium was mildly dilated. - Right ventricle: The cavity size was normal. Wall thickness was   normal. Systolic function was normal. - Pulmonary arteries: Systolic pressure was moderately increased,   in the range of 55 mm Hg to 60 mm Hg. - Pericardium, extracardiac: A trivial pericardial effusion was   identified posterior to the heart.  Patient Profile     76 y.o. female with history of asthma, copd, obesity, HTN, HL, and DM, admitted 6/10 with acute dyspnea and acute combined CHF with pulmonary hypertension.  Assessment & Plan    1. Acute combined CHF: -Echo as above with mildly reduced LV systolic function with WMA -For [facility] today with Dr. Rockey Situ -BUN/SCr improving -LE edema improved -Continue to hold Coreg at this time, restart when able  2. Chest tightness/elevated troponin/elevated D-dimer: -Troponin mildly elevated with a flat trend to peak of 0.17 -Denies any current chest pain or tightness -TTE with normal RV cavity size -Given elevated D-dimer nad persistent  sinus tachycardia, consider VQ scan for evaluation of PE -May want to avoid CTA chest given renal function and contrast allergy  -For [facility] today -Continue ASA/statin  3. Essential HTN: -BP mildly elevated -CC on hold given wheezing -ARB and Lasix on hold given AKI -Norvasc 10 mg daily -Plan to resume the above s/p cath pending breathing and renal function  4. HLD: -Continue statin -Outpatient follow up  5. AECOPD/asthma/acute respiratory failure: -Improving -Inhalers/nebs/steroids per IM -Sounds like she uses nebs frequently at home -Would benefit from outpatient pulmonary evaluation -Needs outpatient sleep  study  Signed, Marcille Blanco [place] Pager: [phone] 03/09/2017, 7:24 AM    Attending Note Patient seen and examined, agree with detailed note above,  Patient presentation and plan discussed on rounds.   Shortness of breath overnight, on nasal cannula oxygen Reports long history of asthma dating back 20 years  Recent echocardiogram results discussed with her Mildly depressed ejection fraction, moderately elevated right heart pressures Cardiac catheterization scheduled later this morning  On physical exam unable to estimate JVP, expiratory wheezing bilaterally, heart sounds regular, tachycardic, obese, no significant lower extremity edema  Lab work reviewed showing BUN 48, creatinine 1.24  Persistent hypertension and tachycardia Systolic pressures in the 150s, heart rate greater than 100 Telemetry reviewed showing sinus rhythm with left bundle branch block  ----Respiratory distress Chronic issue, Cardiac catheterization scheduled for left and right heart cath Further recommendations following the procedure  Greater than 50% was spent in counseling and coordination of care with patient Total encounter time  25 minutes or more   Signed: Esmond Plants  M.D., Ph.D. [facility]

## 2017-03-09 NOTE — Progress Notes (Signed)
[place] at [place] NAME: Angela Pham    MR#:  [id number]  DATE OF BIRTH:  1940/10/30  SUBJECTIVE:  Patient Doing well this morning. She is going for cardiac catheterization. Denies any chest pain. Shortness of breath is improved.  REVIEW OF SYSTEMS:    Review of Systems  Constitutional: Negative for fever, chills weight loss HENT: Negative for ear pain, nosebleeds, congestion, facial swelling, rhinorrhea, neck pain, neck stiffness and ear discharge.   Respiratory: Negative for cough, ++shortness of breath, wheezing (Have improved Cardiovascular: Negative for chest pain, palpitations and ++improved leg swelling.  Gastrointestinal: Negative for heartburn, abdominal pain, vomiting, diarrhea or consitpation Genitourinary: Negative for dysuria, urgency, frequency, hematuria Musculoskeletal: Negative for back pain or joint pain Neurological: Negative for dizziness, seizures, syncope, focal weakness,  numbness and headaches.  Hematological: Does not bruise/bleed easily.  Psychiatric/Behavioral: Negative for hallucinations, confusion, dysphoric mood    Tolerating Diet: Nothing by mouth      DRUG ALLERGIES:   Allergies  Allergen Reactions  . Ace Inhibitors Anaphylaxis    Tongue swelling  . Shellfish Allergy Hives and Swelling    Swelling around face and mouth     VITALS:  Blood pressure (!) 148/66, pulse (!) 115, temperature 98 F (36.7 C), temperature source Oral, resp. rate 16, height 4\' 7"  (1.397 m), weight 84.1 kg (185 lb 6.4 oz), SpO2 90 %.  PHYSICAL EXAMINATION:  Constitutional: Mildly obese No distress. HENT: Normocephalic. Marland Kitchen Oropharynx is clear and moist.  Eyes: Conjunctivae and EOM are normal. PERRLA, no scleral icterus.  Neck: Normal ROM. Neck supple. No JVD. No tracheal deviation. CVS: RRR, S1/S2 +, no murmurs, no gallops, no carotid bruit.  Pulmonary: Effort and breath sounds normal, bilateral expiratory  wheezing(Improved since yesterday) Abdominal: Soft. BS +,  no distension, tenderness, rebound or guarding.  Musculoskeletal: Normal range of motion. Mild pitting edema and no tenderness.  Neuro: Alert. CN 2-12 grossly intact. No focal deficits. Skin: Skin is warm and dry. No rash noted. Psychiatric: Normal mood and affect.      LABORATORY PANEL:   CBC  Recent Labs Lab 03/07/17 0227  WBC 15.2*  HGB 11.4*  HCT 34.1*  PLT 300   ------------------------------------------------------------------------------------------------------------------  Chemistries   Recent Labs Lab 03/07/17 0227  03/09/17 0415  NA 141  < > 142  K 4.2  < > 4.5  CL 108  < > 108  CO2 25  < > 24  GLUCOSE 181*  < > 167*  BUN 40*  < > 48*  CREATININE 1.21*  < > 1.24*  CALCIUM 9.0  < > 9.0  AST 22  --   --   ALT 18  --   --   ALKPHOS 62  --   --   BILITOT 0.4  --   --   < > = values in this interval not displayed. ------------------------------------------------------------------------------------------------------------------  Cardiac Enzymes  Recent Labs Lab 03/07/17 0831 03/07/17 1417 03/07/17 2025  TROPONINI 0.10* 0.15* 0.17*   ------------------------------------------------------------------------------------------------------------------  RADIOLOGY:  No results found.   ASSESSMENT AND PLAN:    76 year old female with mild intermittent asthma who presents with shortness of breath and found to have new CHF.  1. Acute hypoxic respiratory failure in the setting of new onset CHF exacerbation and asthma exacerbation Patient weaned off of oxygen  Order VQ scan given elevated d-dimer and persistent tachycardia as per recommendations by cardiology.  2. Acute combine CHF exacerbation with ejection fraction 45% and  wall motion abnormality: Plan for cardiac catheterization today  Patient is now euvolemic Appreciate cardiology consultation Discontinue Coreg due to concerns that may be  contributing to asthma exacerbation.  3. Acute exacerbation of asthma: Wean IV steroids as tolerated, DuoNeb's and inhaler  4. Diabetes: Continue sliding scale insulin  5. Hyperlipidemia: Continue atorvastatin  6. Essential hypertension: Blood pressure is acceptable. Coreg discontinued due to problem #3 Losartan discontinued for now due to slightly increased creatinine this morning  7. Depression/: Continue Remeron 8. Morbid obesity: Advised weight loss tolerated.  Management plans discussed with the patient and she is in agreement.  CODE STATUS: full  TOTAL TIME TAKING CARE OF THIS PATIENT: 25 minutes.   D/w cardiology  POSSIBLE D/C tomorrow, DEPENDING ON CLINICAL CONDITION.   Angela Pham M.D on 03/09/2017 at 8:17 AM  Between 7am to 6pm - Pager - [phone] After 6pm go to [email or web address] - password [email or web address] [facility]  Office  [phone]  CC: Primary care physician; Center, [facility]  Note: This dictation was prepared with Dragon dictation along with smaller phrase technology. Any transcriptional errors that result from this process are unintentional.

## 2017-03-09 NOTE — Plan of Care (Signed)
Problem: Cardiac: Goal: Ability to achieve and maintain adequate cardiopulmonary perfusion will improve Outcome: Progressing Reports breathing better after nebulizers.  No distress noted on room air.  Voiced understanding of pending cardiac cath.

## 2017-03-09 NOTE — Progress Notes (Signed)
Dr Benjie Karvonen was informed about having runs of SVT'S , see new order , will continue to monitor

## 2017-03-09 NOTE — Progress Notes (Addendum)
Inpatient Diabetes Program Recommendations  AACE/ADA: New Consensus Statement on Inpatient Glycemic Control (2015)  Target Ranges:  Prepandial:   less than 140 mg/dL      Peak postprandial:   less than 180 mg/dL (1-2 hours)      Critically ill patients:  140 - 180 mg/dL   Results for Angela Pham, Angela Pham (MRN [id number]) as of 03/09/2017 11:29  Ref. Range 03/08/2017 09:04 03/08/2017 11:41 03/08/2017 16:38 03/08/2017 21:06 03/09/2017 07:46  Glucose-Capillary Latest Ref Range: 65 - 99 mg/dL 165 (H) 222 (H) 198 (H) 211 (H) 162 (H)   Review of Glycemic Control  Diabetes history: DM2 Outpatient Diabetes medications: Metformin 850 mg BID Current orders for Inpatient glycemic control: Novolog 0-15 units TID with meals, Novolog 0-5 units HS  Inpatient Diabetes Program Recommendations: Insulin - Meal Coverage: When diet resumed and if steroids are continued, please consider ordering Novolog 3 units TID with meals for meal coverage if patient eats at least 50% of meals. A1C: Please consider ordering an A1C to evaluate glycemic control over the past 2-3 months.  Thanks, Barnie Alderman, RN, MSN, CDE Diabetes Coordinator Inpatient Diabetes Program [phone] (Team Pager from 8am to 5pm)

## 2017-03-10 DIAGNOSIS — J45909 Unspecified asthma, uncomplicated: Secondary | ICD-10-CM

## 2017-03-10 LAB — BASIC METABOLIC PANEL
ANION GAP: 6 (ref 5–15)
BUN: 40 mg/dL — ABNORMAL HIGH (ref 6–20)
CALCIUM: 8.6 mg/dL — AB (ref 8.9–10.3)
CHLORIDE: 107 mmol/L (ref 101–111)
CO2: 28 mmol/L (ref 22–32)
Creatinine, Ser: 1 mg/dL (ref 0.44–1.00)
GFR calc non Af Amer: 55 mL/min — ABNORMAL LOW (ref >60)
GLUCOSE: 139 mg/dL — AB (ref 65–99)
Potassium: 4.2 mmol/L (ref 3.5–5.1)
Sodium: 141 mmol/L (ref 135–145)

## 2017-03-10 LAB — HEMOGLOBIN A1C
HEMOGLOBIN A1C: 5.8 % — AB (ref 4.8–5.6)
Mean Plasma Glucose: 120 mg/dL

## 2017-03-10 LAB — GLUCOSE, CAPILLARY
GLUCOSE-CAPILLARY: 220 mg/dL — AB (ref 65–99)
Glucose-Capillary: 128 mg/dL — ABNORMAL HIGH (ref 65–99)
Glucose-Capillary: 126 mg/dL — ABNORMAL HIGH (ref 65–99)
Glucose-Capillary: 173 mg/dL — ABNORMAL HIGH (ref 65–99)

## 2017-03-10 MED ORDER — VERAPAMIL HCL ER 180 MG PO TBCR
360.0000 mg | EXTENDED_RELEASE_TABLET | Freq: Every day | ORAL | Status: DC
  Administered 2017-03-11: 360 mg via ORAL
  Filled 2017-03-10: qty 2

## 2017-03-10 MED ORDER — PREDNISONE 50 MG PO TABS
50.0000 mg | ORAL_TABLET | Freq: Every day | ORAL | Status: DC
  Administered 2017-03-11: 50 mg via ORAL
  Filled 2017-03-10: qty 1

## 2017-03-10 MED ORDER — FUROSEMIDE 10 MG/ML IJ SOLN
40.0000 mg | Freq: Two times a day (BID) | INTRAMUSCULAR | Status: DC
  Administered 2017-03-10 (×2): 40 mg via INTRAVENOUS
  Filled 2017-03-10 (×2): qty 160

## 2017-03-10 MED ORDER — TIOTROPIUM BROMIDE MONOHYDRATE 18 MCG IN CAPS
18.0000 ug | ORAL_CAPSULE | Freq: Every day | RESPIRATORY_TRACT | Status: DC
  Administered 2017-03-10 – 2017-03-11 (×2): 18 ug via RESPIRATORY_TRACT
  Filled 2017-03-10: qty 5

## 2017-03-10 MED ORDER — ALBUTEROL SULFATE (2.5 MG/3ML) 0.083% IN NEBU: 2.5000 mg | INHALATION_SOLUTION | Freq: Once | RESPIRATORY_TRACT | Status: DC

## 2017-03-10 MED ORDER — POTASSIUM CHLORIDE CRYS ER 20 MEQ PO TBCR
20.0000 meq | EXTENDED_RELEASE_TABLET | Freq: Two times a day (BID) | ORAL | Status: DC
  Administered 2017-03-10 – 2017-03-11 (×3): 20 meq via ORAL
  Filled 2017-03-10 (×3): qty 1

## 2017-03-10 MED ORDER — ALBUTEROL SULFATE (2.5 MG/3ML) 0.083% IN NEBU
2.5000 mg | INHALATION_SOLUTION | RESPIRATORY_TRACT | Status: DC | PRN
  Administered 2017-03-10: 2.5 mg via RESPIRATORY_TRACT
  Filled 2017-03-10 (×2): qty 3

## 2017-03-10 MED ORDER — HYDRALAZINE HCL 25 MG PO TABS
25.0000 mg | ORAL_TABLET | Freq: Three times a day (TID) | ORAL | Status: DC
  Administered 2017-03-10 – 2017-03-11 (×2): 25 mg via ORAL
  Filled 2017-03-10 (×2): qty 1

## 2017-03-10 NOTE — Progress Notes (Signed)
Patient complaining of wheezing. Audible wheezes can be heard throughout lungs, oxygen saturations 94% on room air. 1 duo neb given. After breathing treatment patient still complaining of wheezing. Per pt " I take two breathing treatments at home, one is not enough. I have had asthma all my life, and I know what I need for my body". MD Marcille Blanco made aware, MD to place orders. Will continue to monitor.   Angela Pham M

## 2017-03-10 NOTE — Progress Notes (Signed)
[facility] NAME: Angela Pham    MR#:  [id number]  DATE OF BIRTH:  1940-09-28  SUBJECTIVE:   Patient underwent cardiac catheterization yesterday showing elevated left and right heart pressures consistent with chronic diastolic heart failure. Her shortness of breath is improved. She has some mild wheezing.  REVIEW OF SYSTEMS:    Review of Systems  Constitutional: Negative for fever, chills weight loss HENT: Negative for ear pain, nosebleeds, congestion, facial swelling, rhinorrhea, neck pain, neck stiffness and ear discharge.   Respiratory: Negative for cough, Shortness of breath and wheezing have improved Cardiovascular: Negative for chest pain, palpitations and ++improved leg swelling.  Gastrointestinal: Negative for heartburn, abdominal pain, vomiting, diarrhea or consitpation Genitourinary: Negative for dysuria, urgency, frequency, hematuria Musculoskeletal: Negative for back pain or joint pain Neurological: Negative for dizziness, seizures, syncope, focal weakness,  numbness and headaches.  Hematological: Does not bruise/bleed easily.  Psychiatric/Behavioral: Negative for hallucinations, confusion, dysphoric mood    Tolerating Diet: yes      DRUG ALLERGIES:   Allergies  Allergen Reactions  . Ace Inhibitors Anaphylaxis    Tongue swelling  . Shellfish Allergy Hives and Swelling    Swelling around face and mouth     VITALS:  Blood pressure (!) 133/58, pulse (!) 116, temperature 97.7 F (36.5 C), temperature source Oral, resp. rate 17, height 4\' 7"  (1.397 m), weight 84.6 kg (186 lb 8 oz), SpO2 93 %.  PHYSICAL EXAMINATION:  Constitutional: Mildly obese sitting up not in distress. HENT: Normocephalic. Marland Kitchen Oropharynx is clear and moist.  Eyes: Conjunctivae and EOM are normal. PERRLA, no scleral icterus.  Neck: Normal ROM. Neck supple. No JVD. No tracheal deviation. CVS: RRR, S1/S2 +, no murmurs, no gallops, no carotid  bruit.  Pulmonary: Effort and breath sounds normal, mild b/l wheezing  Abdominal: Soft. BS +,  no distension, tenderness, rebound or guarding.  Musculoskeletal: Normal range of motion. NO edema and no tenderness.  Neuro: Alert. CN 2-12 grossly intact. No focal deficits. Skin: Skin is warm and dry. No rash noted. Psychiatric: Normal mood and affect.      LABORATORY PANEL:   CBC  Recent Labs Lab 03/07/17 0227  WBC 15.2*  HGB 11.4*  HCT 34.1*  PLT 300   ------------------------------------------------------------------------------------------------------------------  Chemistries   Recent Labs Lab 03/07/17 0227  03/09/17 1656 03/10/17 0328  NA 141  < >  --  141  K 4.2  < >  --  4.2  CL 108  < >  --  107  CO2 25  < >  --  28  GLUCOSE 181*  < >  --  139*  BUN 40*  < >  --  40*  CREATININE 1.21*  < >  --  1.00  CALCIUM 9.0  < >  --  8.6*  MG  --   --  1.7  --   AST 22  --   --   --   ALT 18  --   --   --   ALKPHOS 62  --   --   --   BILITOT 0.4  --   --   --   < > = values in this interval not displayed. ------------------------------------------------------------------------------------------------------------------  Cardiac Enzymes  Recent Labs Lab 03/07/17 0831 03/07/17 1417 03/07/17 2025  TROPONINI 0.10* 0.15* 0.17*   ------------------------------------------------------------------------------------------------------------------  RADIOLOGY:  No results found.   ASSESSMENT AND PLAN:    76 year old female with mild intermittent asthma  who presents with shortness of breath and found to have new CHF.  1. Acute hypoxic respiratory failure in the setting of new onset CHF exacerbation and asthma exacerbation Patient weaned off of oxygen    2. Acute combine CHF exacerbation with ejection fraction 45% and wall motion abnormality:  Cardiac catheterization shows elevated right and left heart pressures consistent with chronic diastolic heart failure , Wall  motion abnormality secondary to left bundle branch block, Normal coronary arteries  Patient will need further IV diuresis.  Monitor intake and output  Monitor daily weight  Monitor BMP  Discontinue Coreg due to concerns that may be contributing to asthma exacerbation.  3. Acute exacerbation of asthma: Patient has been transitioned from IV steroids to oral prednisone. She will continue with nebulizer treatments and inhaler.  4. Diabetes: Continue ADA diet and sliding scale insulin Hemoglobin A1c is 5.8 5. Hyperlipidemia: Continue atorvastatin  6. Essential hypertension: Blood pressure is acceptable. Coreg discontinued due to problem #3 Losartan discontinued for now due to slightly increased creatinine this morning Continue Verapamil   7. Depression/: Continue Remeron 8. Morbid obesity: Advised weight loss tolerated. She will need outpatient sleep evaluation.   Management plans discussed with the patient and she is in agreement.  CODE STATUS: full  TOTAL TIME TAKING CARE OF THIS PATIENT: 25 minutes.   D/w cardiology  POSSIBLE D/C tomorrow, DEPENDING ON CLINICAL CONDITION.   Hudsyn Champine M.D on 03/10/2017 at 9:04 AM  Between 7am to 6pm - Pager - [phone] After 6pm go to [email or web address] - password [email or web address] [facility]  Office  [phone]  CC: Primary care physician; Center, [facility]  Note: This dictation was prepared with Dragon dictation along with smaller phrase technology. Any transcriptional errors that result from this process are unintentional.

## 2017-03-10 NOTE — Progress Notes (Addendum)
Patient Name: Angela Pham Date of Encounter: 03/10/2017  Primary Cardiologist: new - consult by [facility] Problem List     Active Problems:   Pulmonary edema   CHF (congestive heart failure) (HCC)     Subjective   Status post R/LHC on 6/13 that showed normal coronary arteries with elevated right heart pressures. She continues to ask for more breathing treatments. Renal function continues to improve. Breathing somewhat improved. No chest pain.   Inpatient Medications    Scheduled Meds: . albuterol  2.5 mg Nebulization Once  . aspirin EC  81 mg Oral Daily  . atorvastatin  40 mg Oral q1800  . enoxaparin (LOVENOX) injection  40 mg Subcutaneous Q24H  . insulin aspart  0-15 Units Subcutaneous TID WC  . insulin aspart  0-5 Units Subcutaneous QHS  . insulin aspart  3 Units Subcutaneous TID WC  . mouth rinse  15 mL Mouth Rinse BID  . methylPREDNISolone (SOLU-MEDROL) injection  40 mg Intravenous BH-q7a  . mirtazapine  45 mg Oral QHS  . mometasone-formoterol  2 puff Inhalation BID  . montelukast  10 mg Oral QHS  . potassium chloride  20 mEq Oral Daily  . sodium chloride flush  3 mL Intravenous Q12H  . sodium chloride flush  3 mL Intravenous Q12H  . tiotropium  18 mcg Inhalation Daily  . verapamil  240 mg Oral Daily   Continuous Infusions: . sodium chloride    . sodium chloride     PRN Meds: sodium chloride, sodium chloride, acetaminophen, albuterol, albuterol, ipratropium-albuterol, lactulose, ondansetron (ZOFRAN) IV, sodium chloride flush, sodium chloride flush, tetrahydrozoline   Vital Signs    Vitals:   03/09/17 1929 03/10/17 0049 03/10/17 0401 03/10/17 0726  BP: 131/70  130/66   Pulse: 100  98   Resp: 16  16   Temp: 98 F (36.7 C)  97.5 F (36.4 C)   TempSrc: Oral  Oral   SpO2: 95% 94% 93% 93%  Weight:   186 lb 8 oz (84.6 kg)   Height:        Intake/Output Summary (Last 24 hours) at 03/10/17 0839 Last data filed at 03/10/17 0401  Gross per 24 hour    Intake              240 ml  Output                0 ml  Net              240 ml   Filed Weights   03/09/17 0334 03/09/17 0913 03/10/17 0401  Weight: 185 lb 6.4 oz (84.1 kg) 185 lb (83.9 kg) 186 lb 8 oz (84.6 kg)    Physical Exam    GEN: Well nourished, well developed, in no acute distress.  HEENT: Grossly normal.  Neck: Supple, JVD difficult to assess, no carotid bruits, or masses. Cardiac: Tachycardic, no murmurs, rubs, or gallops. No clubbing, cyanosis, edema.  Radials/DP/PT 2+ and equal bilaterally.  Respiratory:  Respirations regular and unlabored, clear to auscultation bilaterally. GI: Soft, nontender, nondistended, BS + x 4. MS: no deformity or atrophy. Skin: warm and dry, no rash. Neuro:  Strength and sensation are intact. Psych: AAOx3.  Normal affect.  Labs    CBC No results for input(s): WBC, NEUTROABS, HGB, HCT, MCV, PLT in the last 72 hours. Basic Metabolic Panel  Recent Labs  03/09/17 0415 03/09/17 1656 03/10/17 0328  NA 142  --  141  K 4.5  --  4.2  CL 108  --  107  CO2 24  --  28  GLUCOSE 167*  --  139*  BUN 48*  --  40*  CREATININE 1.24*  --  1.00  CALCIUM 9.0  --  8.6*  MG  --  1.7  --    Liver Function Tests No results for input(s): AST, ALT, ALKPHOS, BILITOT, PROT, ALBUMIN in the last 72 hours. No results for input(s): LIPASE, AMYLASE in the last 72 hours. Cardiac Enzymes  Recent Labs  03/07/17 1417 03/07/17 2025  TROPONINI 0.15* 0.17*   BNP Invalid input(s): POCBNP D-Dimer No results for input(s): DDIMER in the last 72 hours. Hemoglobin A1C  Recent Labs  03/09/17 0415  HGBA1C 5.8*   Fasting Lipid Panel No results for input(s): CHOL, HDL, LDLCALC, TRIG, CHOLHDL, LDLDIRECT in the last 72 hours. Thyroid Function Tests  Recent Labs  03/09/17 0415  TSH 0.659    Telemetry    Sinus tachycardia, low 100s to 110s bpm - Personally Reviewed  ECG    n/a - Personally Reviewed  Radiology    No results found.  Cardiac  Studies   [facility] 03/09/17: Procedural Findings:   Coronary angiography:  Coronary dominance: Right  Left mainstem: Large vessel that bifurcates into the LAD and left circumflex, no significant disease noted  Left anterior descending (LAD): Large vessel that extends to the apical region, diagonal branch 2 of moderate size, no significant disease noted  Left circumflex (LCx): Large vessel with OM branch 2, no significant disease noted  Right coronary artery (RCA): Right dominant vessel with PL and PDA, no significant disease noted  Left ventriculography: Not performed as she had recent echo and has underlying CRI  Final Conclusions:  Normal coronary arteries  Right heart pressures: RA: mean 22 RV: 53/18/22 RVEDP: 22 LVEDP 38-43 Unable to advance catheter to wedge pressure despite multiple attempts   Recommendations:  Normal coronary arteries Wall motion secondary to LBBB Markedly elevated LVEDP Elevated right atrial pressures Numbers consistent with chronic diastolic CHF Blood pressure markedly elevated , tachycardic Will D/c amlodipine, start verapamil to assist with rate control   TTE 03/08/17: Study Conclusions  - Left ventricle: The cavity size was at the upper limits of   normal. Wall thickness was increased in a pattern of mild LVH.   Systolic function was mildly reduced. The estimated ejection   fraction was in the range of 45% to 50%. There is hypokinesis of   the basal-midanteroseptal myocardium. - Aortic valve: Poorly visualized. A bicuspid morphology cannot be   excluded. There was mild regurgitation. - Mitral valve: Calcified annulus. Mildly thickened leaflets .   Leaflet separation was mildly reduced. There was moderate   regurgitation directed posteriorly. - Left atrium: The atrium was mildly dilated. - Right ventricle: The cavity size was normal. Wall thickness was   normal. Systolic function was normal. - Pulmonary arteries: Systolic pressure was  moderately increased,   in the range of 55 mm Hg to 60 mm Hg. - Pericardium, extracardiac: A trivial pericardial effusion was   identified posterior to the heart.  Patient Profile     76 y.o. female with history of asthma, copd, obesity, HTN, HL, and DM, admitted 6/10 with acute dyspnea and acute combined CHF with pulmonary hypertension.  Assessment & Plan    1. Acute on chronic combined CHF: -Echo as above with mildly rduced LV systolic function with [facility] -R/LHC on 6/13 as above with normal coronary arteries and elevated right heart pressures -  BUN/SCr improving -LE edema improved -Restart IV Lasix this morning with KCl repletion -Continue to hold Coreg at this time, restart when able  2. Chest tightness/elevated troponin/elevated D-dimer: -Troponin mildly elevated with a flat trend to peak of 0.17 -Denies any current chest pain or tightness -TTE with normal RV cavity size -Given elevated D-dimer nad persistent sinus tachycardia, consider VQ scan for evaluation of PE -May want to avoid CTA chest given renal function and contrast allergy  -R/LHC as above -Continue ASA/statin  3. Essential HTN: -BP mildly elevated -BB on hold given wheezing -Restart Lasix today -Norvasc 10 mg daily -Resume ARB prior to discharge  4. Sinus tachycardia: -Consider VQ scan as above, defer to IM -Verapamil added on 6/13, titrate as needed  5. HLD: -Continue statin -Outpatient follow up  6. AECOPD/asthma/acute respiratory failure: -Improving -Inhalers/nebs/steroids per IM, she requests increase in her neb frequency, defer to IM -Sounds like she uses nebs frequently at home -Would benefit from outpatient pulmonary evaluation -Needs outpatient sleep study  Signed, Marcille Blanco [place] Pager: [phone] 03/10/2017, 8:39 AM   Attending Note Patient seen and examined, agree with detailed note above,  Patient presentation and plan discussed on rounds.   Recovered from  cardiac catheterization yesterday Results reviewed with her again today, no significant coronary artery disease Markedly elevated left ventricular end-diastolic pressures Started on verapamil 240 mg yesterday with improvement in her blood pressure Heart rate continues to run high  On physical exam sitting up in a chair with no oxygen,  Unable to estimate JVP, heart sounds tachycardic but regular with no murmurs appreciated, expiratory wheezing bilaterally,  abdomen obese soft nontender no significant lower extremity edema  Lab work reviewed showing creatinine 1.0, BUN 40  ----Respiratory distress Likely multifactorial including: Acute on chronic diastolic CHF, morbid obesity/obesity hypoventilation, suspected sleep apnea, asthma, reported controlled hypertension Would restart Lasix given normal renal function after catheterization yesterday Increase verapamil dose for blood pressure and heart rate control Would likely need to be discharged on Lasix daily 20-40 We'll need follow-up in CHF clinic    Greater than 50% was spent in counseling and coordination of care with patient Total encounter time 25 minutes or more   Signed: Esmond Plants  M.D., Ph.D. [facility]

## 2017-03-11 ENCOUNTER — Telehealth: Payer: Self-pay | Admitting: *Deleted

## 2017-03-11 DIAGNOSIS — J4550 Severe persistent asthma, uncomplicated: Secondary | ICD-10-CM

## 2017-03-11 LAB — BASIC METABOLIC PANEL
Anion gap: 8 (ref 5–15)
BUN: 53 mg/dL — AB (ref 6–20)
CHLORIDE: 104 mmol/L (ref 101–111)
CO2: 30 mmol/L (ref 22–32)
CREATININE: 1.36 mg/dL — AB (ref 0.44–1.00)
Calcium: 9.1 mg/dL (ref 8.9–10.3)
GFR calc Af Amer: 44 mL/min — ABNORMAL LOW (ref >60)
GFR calc non Af Amer: 38 mL/min — ABNORMAL LOW (ref >60)
GLUCOSE: 121 mg/dL — AB (ref 65–99)
Potassium: 4.5 mmol/L (ref 3.5–5.1)
SODIUM: 142 mmol/L (ref 135–145)

## 2017-03-11 LAB — GLUCOSE, CAPILLARY
GLUCOSE-CAPILLARY: 115 mg/dL — AB (ref 65–99)
Glucose-Capillary: 173 mg/dL — ABNORMAL HIGH (ref 65–99)
Glucose-Capillary: 185 mg/dL — ABNORMAL HIGH (ref 65–99)

## 2017-03-11 MED ORDER — PREDNISONE 50 MG PO TABS: ORAL_TABLET | ORAL | 4 refills | Status: DC

## 2017-03-11 MED ORDER — FUROSEMIDE 40 MG PO TABS
40.0000 mg | ORAL_TABLET | Freq: Every day | ORAL | Status: DC
Start: 2017-03-11 — End: 2017-03-11
  Administered 2017-03-11: 40 mg via ORAL
  Filled 2017-03-11: qty 1

## 2017-03-11 MED ORDER — MONTELUKAST SODIUM 10 MG PO TABS: 10.0000 mg | ORAL_TABLET | Freq: Every day | ORAL | 0 refills | Status: DC

## 2017-03-11 MED ORDER — VERAPAMIL HCL ER 180 MG PO TBCR
360.0000 mg | EXTENDED_RELEASE_TABLET | Freq: Every day | ORAL | 0 refills | Status: DC
Start: 2017-03-12 — End: 2017-06-12

## 2017-03-11 MED ORDER — ASPIRIN 81 MG PO TBEC: 81.0000 mg | DELAYED_RELEASE_TABLET | Freq: Every day | ORAL | Status: AC

## 2017-03-11 MED ORDER — FUROSEMIDE 40 MG PO TABS: 40.0000 mg | ORAL_TABLET | Freq: Every day | ORAL | 0 refills | Status: DC

## 2017-03-11 MED ORDER — HYDRALAZINE HCL 50 MG PO TABS
50.0000 mg | ORAL_TABLET | Freq: Three times a day (TID) | ORAL | Status: DC
  Administered 2017-03-11: 50 mg via ORAL
  Filled 2017-03-11: qty 1

## 2017-03-11 MED ORDER — TIOTROPIUM BROMIDE MONOHYDRATE 18 MCG IN CAPS: 18.0000 ug | ORAL_CAPSULE | Freq: Every day | RESPIRATORY_TRACT | 12 refills | Status: DC

## 2017-03-11 MED ORDER — HYDRALAZINE HCL 50 MG PO TABS: 50.0000 mg | ORAL_TABLET | Freq: Three times a day (TID) | ORAL | 0 refills | Status: DC

## 2017-03-11 NOTE — Progress Notes (Signed)
SATURATION QUALIFICATIONS: (This note is used to comply with regulatory documentation for home oxygen)  Patient Saturations on Room Air at Rest = 96%  Patient Saturations on Room Air while Ambulating = 92%  Patient Saturations on 0 Liters of oxygen while Ambulating = n/a%  Please briefly explain why patient needs home oxygen: does not qualify

## 2017-03-11 NOTE — Progress Notes (Signed)
Iv and tele removed from patient. Discharge instructions given to patient along with hard copy prescriptions. Verbalized understanding. No distress at this time. Patient awaiting her transportation.

## 2017-03-11 NOTE — Care Management (Signed)
Patient did not qualified for home oxygen.  For discharge today.  Declines home health follow up but agreeable with [facility].

## 2017-03-11 NOTE — Care Management Important Message (Signed)
Important Message  Patient Details  Name: Angela Pham MRN: [id number] Date of Birth: Sep 28, 1940   Medicare Important Message Given:  Yes Signed IM notice given   Katrina Stack, RN 03/11/2017, 12:42 PM

## 2017-03-11 NOTE — Telephone Encounter (Signed)
-----   Message from Blain Pais sent at 03/11/2017 11:06 AM EDT ----- Regarding: tcm/ph 7/19 11 am Murray Hodgkins, NP

## 2017-03-11 NOTE — Telephone Encounter (Signed)
Patient currently admitted at this time. 

## 2017-03-11 NOTE — Discharge Summary (Signed)
[place] [facility] [place] NAME: Angela Pham    MR#:  [id number]  DATE OF BIRTH:  1941-02-23  DATE OF ADMISSION:  03/07/2017 ADMITTING PHYSICIAN: Saundra Shelling, MD  DATE OF DISCHARGE: *03/11/2017  PRIMARY CARE PHYSICIAN: [facility] [place]    ADMISSION DIAGNOSIS:  CHF (congestive heart failure) ([place]) [I50.9]  DISCHARGE DIAGNOSIS:  Active Problems:   Pulmonary edema   CHF (congestive heart failure) ([place])   SECONDARY DIAGNOSIS:   Past Medical History:  Diagnosis Date  . Asthma   . Hyperlipidemia   . Hypertension   . Morbid obesity ([place])   . Type II diabetes mellitus [facility])     HOSPITAL COURSE:   76 year old female with mild intermittent asthma who presents with shortness of breath and found to have new CHF.  1. Acute hypoxic respiratory failure in the setting of new onset CHF exacerbation and asthma exacerbation Patient has been weaned off of oxygen    2. Acute combine CHF exacerbation with ejection fraction 45% and wall motion abnormality:  Cardiac catheterization shows elevated right and left heart pressures consistent with chronic diastolic heart failure , Wall motion abnormality secondary to left bundle branch block, Normal coronary arteries  Patient will need Oral Lasix at home. She is referred to CHF clinic at discharge. We have discontinued Coreg due to concerns that may be contributing to asthma exacerbation.  3. Acute exacerbation of asthma: Patient has been transitioned from IV steroids to oral prednisone. She will continue with nebulizer treatments and inhaler.  4. Diabetes: Continue ADA diet and home medications. Hemoglobin A1c is 5.8  5. Hyperlipidemia: Continue atorvastatin  6. Essential hypertension: Blood pressure is acceptable. Coreg discontinued due to problem #3 Losartan discontinued for now due to slightly increased creatinine this morning Recommendations were to start verapamil  hydralazine.   7. Depression: Continue Remeron 8. Morbid obesity: Advised weight loss tolerated. She will need outpatient sleep evaluation.   DISCHARGE CONDITIONS AND DIET:   Stable for discharge on cardiac diet/diabetic diet  CONSULTS OBTAINED:  Treatment Team:  Wellington Hampshire, MD  DRUG ALLERGIES:   Allergies  Allergen Reactions  . Ace Inhibitors Anaphylaxis    Tongue swelling  . Shellfish Allergy Hives and Swelling    Swelling around face and mouth     DISCHARGE MEDICATIONS:   Current Discharge Medication List    START taking these medications   Details  aspirin EC 81 MG EC tablet Take 1 tablet (81 mg total) by mouth daily.    furosemide (LASIX) 40 MG tablet Take 1 tablet (40 mg total) by mouth daily. Qty: 30 tablet, Refills: 0    hydrALAZINE (APRESOLINE) 50 MG tablet Take 1 tablet (50 mg total) by mouth every 8 (eight) hours. Qty: 90 tablet, Refills: 0    predniSONE (DELTASONE) 50 MG tablet Take 1 tablet for 4 days then stop Qty: 4 tablet, Refills: 4    tiotropium (SPIRIVA) 18 MCG inhalation capsule Place 1 capsule (18 mcg total) into inhaler and inhale daily. Qty: 30 capsule, Refills: 12    verapamil (CALAN-SR) 180 MG CR tablet Take 2 tablets (360 mg total) by mouth daily. Qty: 60 tablet, Refills: 0      CONTINUE these medications which have CHANGED   Details  montelukast (SINGULAIR) 10 MG tablet Take 1 tablet (10 mg total) by mouth at bedtime. Qty: 30 tablet, Refills: 0      CONTINUE these medications which have NOT CHANGED   Details  atorvastatin (  LIPITOR) 40 MG tablet Take 40 mg by mouth daily.    Ipratropium-Albuterol (COMBIVENT IN) Inhale 3 puffs into the lungs every 4 (four) hours as needed.    ipratropium-albuterol (DUONEB) 0.5-2.5 (3) MG/3ML SOLN Inhale 3 mLs into the lungs every 4 (four) hours as needed.    lactulose (CHRONULAC) 10 GM/15ML solution Take 30 g by mouth daily.    metFORMIN (GLUCOPHAGE) 850 MG tablet Take 1 tablet by  mouth 2 (two) times daily.    mirtazapine (REMERON) 45 MG tablet Take 1 tablet by mouth at bedtime.    Multiple Vitamins-Minerals (EYE VITAMINS PO) Take 1 tablet by mouth daily.      STOP taking these medications     amLODipine (NORVASC) 10 MG tablet      Fluticasone-Salmeterol (ADVAIR DISKUS) 500-50 MCG/DOSE AEPB      losartan (COZAAR) 100 MG tablet      NON FORMULARY           Today   CHIEF COMPLAINT:  Patient doing very well this morning. Denies shortness of breath or chest pain   VITAL SIGNS:  Blood pressure 140/76, pulse 100, temperature 98.1 F (36.7 C), temperature source Oral, resp. rate 16, height 4\' 7"  (1.397 m), weight 78.6 kg (173 lb 4.8 oz), SpO2 92 %.   REVIEW OF SYSTEMS:  Review of Systems  Constitutional: Negative.  Negative for chills, fever and malaise/fatigue.  HENT: Negative.  Negative for ear discharge, ear pain, hearing loss, nosebleeds and sore throat.   Eyes: Negative.  Negative for blurred vision and pain.  Respiratory: Negative.  Negative for cough, hemoptysis, shortness of breath and wheezing.   Cardiovascular: Negative.  Negative for chest pain, palpitations and leg swelling.  Gastrointestinal: Negative.  Negative for abdominal pain, blood in stool, diarrhea, nausea and vomiting.  Genitourinary: Negative.  Negative for dysuria.  Musculoskeletal: Negative.  Negative for back pain.  Skin: Negative.   Neurological: Negative for dizziness, tremors, speech change, focal weakness, seizures and headaches.  Endo/Heme/Allergies: Negative.  Does not bruise/bleed easily.  Psychiatric/Behavioral: Negative.  Negative for depression, hallucinations and suicidal ideas.     PHYSICAL EXAMINATION:  GENERAL:  76 y.o.-year-old patient lying in the bed with no acute distress.  NECK:  Supple, no jugular venous distention. No thyroid enlargement, no tenderness.  LUNGS: Normal breath sounds bilaterally, no wheezing, rales,rhonchi  No use of accessory muscles  of respiration.  CARDIOVASCULAR: S1, S2 normal. No murmurs, rubs, or gallops.  ABDOMEN: Soft, non-tender, non-distended. Bowel sounds present. No organomegaly or mass.  EXTREMITIES: No pedal edema, cyanosis, or clubbing.  PSYCHIATRIC: The patient is alert and oriented x 3.  SKIN: No obvious rash, lesion, or ulcer.   DATA REVIEW:   CBC  Recent Labs Lab 03/07/17 0227  WBC 15.2*  HGB 11.4*  HCT 34.1*  PLT 300    Chemistries   Recent Labs Lab 03/07/17 0227  03/09/17 1656  03/11/17 0342  NA 141  < >  --   < > 142  K 4.2  < >  --   < > 4.5  CL 108  < >  --   < > 104  CO2 25  < >  --   < > 30  GLUCOSE 181*  < >  --   < > 121*  BUN 40*  < >  --   < > 53*  CREATININE 1.21*  < >  --   < > 1.36*  CALCIUM 9.0  < >  --   < >  9.1  MG  --   --  1.7  --   --   AST 22  --   --   --   --   ALT 18  --   --   --   --   ALKPHOS 62  --   --   --   --   BILITOT 0.4  --   --   --   --   < > = values in this interval not displayed.  Cardiac Enzymes  Recent Labs Lab 03/07/17 0831 03/07/17 1417 03/07/17 2025  TROPONINI 0.10* 0.15* 0.17*    Microbiology Results  @MICRORSLT48 @  RADIOLOGY:  No results found.    Current Discharge Medication List    START taking these medications   Details  aspirin EC 81 MG EC tablet Take 1 tablet (81 mg total) by mouth daily.    furosemide (LASIX) 40 MG tablet Take 1 tablet (40 mg total) by mouth daily. Qty: 30 tablet, Refills: 0    hydrALAZINE (APRESOLINE) 50 MG tablet Take 1 tablet (50 mg total) by mouth every 8 (eight) hours. Qty: 90 tablet, Refills: 0    predniSONE (DELTASONE) 50 MG tablet Take 1 tablet for 4 days then stop Qty: 4 tablet, Refills: 4    tiotropium (SPIRIVA) 18 MCG inhalation capsule Place 1 capsule (18 mcg total) into inhaler and inhale daily. Qty: 30 capsule, Refills: 12    verapamil (CALAN-SR) 180 MG CR tablet Take 2 tablets (360 mg total) by mouth daily. Qty: 60 tablet, Refills: 0      CONTINUE these  medications which have CHANGED   Details  montelukast (SINGULAIR) 10 MG tablet Take 1 tablet (10 mg total) by mouth at bedtime. Qty: 30 tablet, Refills: 0      CONTINUE these medications which have NOT CHANGED   Details  atorvastatin (LIPITOR) 40 MG tablet Take 40 mg by mouth daily.    Ipratropium-Albuterol (COMBIVENT IN) Inhale 3 puffs into the lungs every 4 (four) hours as needed.    ipratropium-albuterol (DUONEB) 0.5-2.5 (3) MG/3ML SOLN Inhale 3 mLs into the lungs every 4 (four) hours as needed.    lactulose (CHRONULAC) 10 GM/15ML solution Take 30 g by mouth daily.    metFORMIN (GLUCOPHAGE) 850 MG tablet Take 1 tablet by mouth 2 (two) times daily.    mirtazapine (REMERON) 45 MG tablet Take 1 tablet by mouth at bedtime.    Multiple Vitamins-Minerals (EYE VITAMINS PO) Take 1 tablet by mouth daily.      STOP taking these medications     amLODipine (NORVASC) 10 MG tablet      Fluticasone-Salmeterol (ADVAIR DISKUS) 500-50 MCG/DOSE AEPB      losartan (COZAAR) 100 MG tablet      NON FORMULARY            Management plans discussed with the patient and she is in agreement. Stable for discharge home  Patient should follow up with pcp cardiology  CODE STATUS:     Code Status Orders        Start     Ordered   03/09/17 1235  Full code  Continuous     03/09/17 1234    Code Status History    Date Active Date Inactive Code Status Order ID Comments User Context   03/07/2017  5:12 AM 03/09/2017 12:34 PM Full Code [id number]  Saundra Shelling, MD Inpatient      TOTAL TIME TAKING CARE OF THIS PATIENT: 38 minutes.  Note: This dictation was prepared with Dragon dictation along with smaller phrase technology. Any transcriptional errors that result from this process are unintentional.  Angela Pham M.D on 03/11/2017 at 10:38 AM  Between 7am to 6pm - Pager - [phone] After 6pm go to [email or web address] - password [email or web address] [facility]  Office   [phone]  CC: Primary care physician; [facility], [facility]

## 2017-03-11 NOTE — Progress Notes (Signed)
This Rn assuming pnt care. Pnt observed resting in bed appears to be asleep and comfortable. No issues or concerns at this time. Bed low, locked and call bell in reach. Will continue to monitor and assess.

## 2017-03-11 NOTE — Progress Notes (Addendum)
Patient Name: Angela Pham Date of Encounter: 03/11/2017  Primary Cardiologist: new - consult by [facility] Problem List     Active Problems:   Pulmonary edema   CHF (congestive heart failure) (HCC)     Subjective   Status post R/LHC on 6/13 that showed normal coronary arteries with elevated right heart pressures. Restarted on IV Lasix 6/14 with brisk UOP of 5.1 L for the past 24 hours, now net -2.0 L for the admission. No chest pain.   Inpatient Medications    Scheduled Meds: . albuterol  2.5 mg Nebulization Once  . aspirin EC  81 mg Oral Daily  . atorvastatin  40 mg Oral q1800  . enoxaparin (LOVENOX) injection  40 mg Subcutaneous Q24H  . furosemide  40 mg Intravenous Q12H  . hydrALAZINE  25 mg Oral Q8H  . insulin aspart  0-15 Units Subcutaneous TID WC  . insulin aspart  0-5 Units Subcutaneous QHS  . insulin aspart  3 Units Subcutaneous TID WC  . mouth rinse  15 mL Mouth Rinse BID  . mirtazapine  45 mg Oral QHS  . mometasone-formoterol  2 puff Inhalation BID  . montelukast  10 mg Oral QHS  . potassium chloride  20 mEq Oral BID  . predniSONE  50 mg Oral Q breakfast  . sodium chloride flush  3 mL Intravenous Q12H  . tiotropium  18 mcg Inhalation Daily  . verapamil  360 mg Oral Daily   Continuous Infusions: . sodium chloride    . sodium chloride     PRN Meds: sodium chloride, sodium chloride, acetaminophen, albuterol, ipratropium-albuterol, lactulose, ondansetron (ZOFRAN) IV, sodium chloride flush, sodium chloride flush, tetrahydrozoline   Vital Signs    Vitals:   03/10/17 0726 03/10/17 0841 03/10/17 1946 03/11/17 0349  BP:  (!) 133/58 (!) 156/67 (!) 153/86  Pulse:  (!) 116 (!) 108 (!) 110  Resp:  17 16 18   Temp:  97.7 F (36.5 C) 98 F (36.7 C) 98.1 F (36.7 C)  TempSrc:  Oral Oral Oral  SpO2: 93% 93% 94% 96%  Weight:    173 lb 4.8 oz (78.6 kg)  Height:        Intake/Output Summary (Last 24 hours) at 03/11/17 0731 Last data filed at 03/11/17  0500  Gross per 24 hour  Intake              958 ml  Output             5100 ml  Net            -4142 ml   Filed Weights   03/09/17 0913 03/10/17 0401 03/11/17 0349  Weight: 185 lb (83.9 kg) 186 lb 8 oz (84.6 kg) 173 lb 4.8 oz (78.6 kg)    Physical Exam    GEN: Well nourished, well developed, in no acute distress.  HEENT: Grossly normal.  Neck: Supple, JVD difficult to assess, no carotid bruits, or masses. Cardiac: RRR, no murmurs, rubs, or gallops. No clubbing, cyanosis, edema.  Radials/DP/PT 2+ and equal bilaterally.  Respiratory:  Respirations regular and unlabored, clear to auscultation bilaterally. Faint expiratory wheezing. On room air.  GI: Soft, nontender, nondistended, BS + x 4. MS: no deformity or atrophy. Skin: warm and dry, no rash. Neuro:  Strength and sensation are intact. Psych: AAOx3.  Normal affect.  Labs    CBC No results for input(s): WBC, NEUTROABS, HGB, HCT, MCV, PLT in the last 72 hours. Basic Metabolic Panel  Recent Labs  03/09/17 1656 03/10/17 0328 03/11/17 0342  NA  --  141 142  K  --  4.2 4.5  CL  --  107 104  CO2  --  28 30  GLUCOSE  --  139* 121*  BUN  --  40* 53*  CREATININE  --  1.00 1.36*  CALCIUM  --  8.6* 9.1  MG 1.7  --   --    Liver Function Tests No results for input(s): AST, ALT, ALKPHOS, BILITOT, PROT, ALBUMIN in the last 72 hours. No results for input(s): LIPASE, AMYLASE in the last 72 hours. Cardiac Enzymes No results for input(s): CKTOTAL, CKMB, CKMBINDEX, TROPONINI in the last 72 hours. BNP Invalid input(s): POCBNP D-Dimer No results for input(s): DDIMER in the last 72 hours. Hemoglobin A1C  Recent Labs  03/09/17 0415  HGBA1C 5.8*   Fasting Lipid Panel No results for input(s): CHOL, HDL, LDLCALC, TRIG, CHOLHDL, LDLDIRECT in the last 72 hours. Thyroid Function Tests  Recent Labs  03/09/17 0415  TSH 0.659    Telemetry    NSR, 90s bpm - Personally Reviewed  ECG    n/a - Personally Reviewed  Radiology     No results found.  Cardiac Studies   [facility] 03/09/17: Procedural Findings:   Coronary angiography:  Coronary dominance: Right  Left mainstem: Large vessel that bifurcates into the LAD and left circumflex, no significant disease noted  Left anterior descending (LAD): Large vessel that extends to the apical region, diagonal branch 2 of moderate size, no significant disease noted  Left circumflex (LCx): Large vessel with OM branch 2, no significant disease noted  Right coronary artery (RCA): Right dominant vessel with PL and PDA, no significant disease noted  Left ventriculography: Not performed as she had recent echo and has underlying CRI  Final Conclusions:  Normal coronary arteries  Right heart pressures: RA: mean 22 RV: 53/18/22 RVEDP: 22 LVEDP 38-43 Unable to advance catheter to wedge pressure despite multiple attempts   Recommendations:  Normal coronary arteries Wall motion secondary to LBBB Markedly elevated LVEDP Elevated right atrial pressures Numbers consistent with chronic diastolic CHF Blood pressure markedly elevated , tachycardic Will D/c amlodipine, start verapamil to assist with rate control   TTE 03/08/17: Study Conclusions  - Left ventricle: The cavity size was at the upper limits of   normal. Wall thickness was increased in a pattern of mild LVH.   Systolic function was mildly reduced. The estimated ejection   fraction was in the range of 45% to 50%. There is hypokinesis of   the basal-midanteroseptal myocardium. - Aortic valve: Poorly visualized. A bicuspid morphology cannot be   excluded. There was mild regurgitation. - Mitral valve: Calcified annulus. Mildly thickened leaflets .   Leaflet separation was mildly reduced. There was moderate   regurgitation directed posteriorly. - Left atrium: The atrium was mildly dilated. - Right ventricle: The cavity size was normal. Wall thickness was   normal. Systolic function was normal. -  Pulmonary arteries: Systolic pressure was moderately increased,   in the range of 55 mm Hg to 60 mm Hg. - Pericardium, extracardiac: A trivial pericardial effusion was   identified posterior to the heart.  Patient Profile     76 y.o. female with history of asthma, copd, obesity, HTN, HL, and DM, admitted 6/10 with acute dyspnea and acute combined CHF with pulmonary hypertension.  Assessment & Plan    1. Acute on chronic combined CHF: -Echo as above with mildly rduced LV systolic  function with [facility] -R/LHC on 6/13 as above with normal coronary arteries and elevated right heart pressures -BUN/SCr slightly bumped this morning -LE edema improved -Transition from IV Lasix to PO Lasix 40 mg daily, may need bid with KCl repletion -Continue to hold Coreg at this time, restart when able  2. Chest tightness/elevated troponin/elevated D-dimer: -Improved -Troponin mildly elevated with a flat trend to peak of 0.17 -Denies any current chest pain or tightness -TTE with normal RV cavity size -Given elevated D-dimer nad persistent sinus tachycardia, consider VQ scan for evaluation of PE -May want to avoid CTA chest given renal function and contrast allergy  -R/LHC as above -Continue ASA/statin  3. Essential HTN: -BP mildly elevated -BB on hold given wheezing -PO Lasix as above -Norvasc 10 mg daily -Increase hydralazine to 50 mg tid -Resume ARB prior to discharge, pending renal function  4. Sinus tachycardia: -Improved -Consider VQ scan as above, defer to IM -Verapamil added on 6/13, titrate as needed  5. HLD: -Continue statin -Outpatient follow up  6. AECOPD/asthma/acute respiratory failure: -Improving -Inhalers/nebs/steroids per IM, she requests increase in her neb frequency, defer to IM -Sounds like she uses nebs frequently at home -May benefit from Cochrane in place of albuterol  -Would benefit from outpatient pulmonary evaluation -Needs outpatient sleep study  Signed, Marcille Blanco [place] Pager: [phone] 03/11/2017, 7:31 AM    Attending Note Patient seen and examined, agree with detailed note above,  Patient presentation and plan discussed on rounds.   Feels much better this morning, less short of breath She has not required nebulizer treatment in the past 24 hours Significant urine output on IV Lasix Has not ambulated very much  Lab work reviewed showing climbing creatinine 1.36, BUN 53  On physical exam scant expiratory wheezes, unable to estimate JVD, heart sounds regular with no murmurs appreciated, abdomen soft nontender, no significant lower extremity edema  ----Acute on chronic diastolic CHF IV Lasix on hold Changed to 40 mg daily Long discussion concerning her diet, fluid intake, salt intake, She does not have a scale, recommended she buy a scale and weigh daily Suggested Lasix 40 twice a day for 3 pound weight gain Stressed importance of aggressive blood pressure control Suggested if her nebulizer requirements increase this could be a sign of fluid retention  ----Morbid obesity We have encouraged continued exercise, careful diet management in an effort to lose weight.  --- Reactive airway disease On nebulizers him a long history of asthma  Long discussion concerning CHF management Need follow-up in CHF clinic Greater than 50% was spent in counseling and coordination of care with patient Total encounter time 35 minutes or more   Signed: Esmond Plants  M.D., Ph.D. [facility]

## 2017-03-15 ENCOUNTER — Other Ambulatory Visit: Payer: Self-pay | Admitting: *Deleted

## 2017-03-15 DIAGNOSIS — I5032 Chronic diastolic (congestive) heart failure: Secondary | ICD-10-CM

## 2017-03-16 LAB — CULTURE, BLOOD (ROUTINE X 2)
CULTURE: NO GROWTH
Culture: NO GROWTH
SPECIAL REQUESTS: ADEQUATE
SPECIAL REQUESTS: ADEQUATE

## 2017-03-21 ENCOUNTER — Telehealth: Payer: Self-pay | Admitting: Family

## 2017-03-21 ENCOUNTER — Ambulatory Visit: Payer: Medicare Other | Admitting: Family

## 2017-03-21 NOTE — Telephone Encounter (Signed)
Patient missed her initial appointment at the [facility] on 03/21/17. Will attempt to reschedule.

## 2017-03-23 NOTE — Telephone Encounter (Signed)
S/w patient. She said she's already followed up with Dr Cletis [place]. She says Dr Lavera Guise told her he could manage her care from this point on and additional f/u with cardiologist was not needed. Patient does not feel she needs this appt. Advised patient that it is recommended her to f/u with cardiologist and then it can be determined if patient needs routine or as needed f/u. She said she had transportation issues and no one spoke with her prior to making the appt with our office. Patient would like to keep her care with Dr Lavera Guise. Patient requested appt be cancelled. Appt cancelled.

## 2017-03-23 NOTE — Telephone Encounter (Signed)
No answer. Left message to call back.   

## 2017-04-14 ENCOUNTER — Ambulatory Visit: Payer: Medicare Other | Admitting: Nurse Practitioner

## 2017-05-04 ENCOUNTER — Inpatient Hospital Stay: Payer: Medicare Other

## 2017-05-04 ENCOUNTER — Encounter: Payer: Self-pay | Admitting: Internal Medicine

## 2017-05-04 ENCOUNTER — Inpatient Hospital Stay
Admission: EM | Admit: 2017-05-04 | Discharge: 2017-05-08 | DRG: 189 | Disposition: A | Discharge status: Home / Self Care | Payer: Medicare Other | Attending: Internal Medicine | Admitting: Internal Medicine

## 2017-05-04 DIAGNOSIS — M25562 Pain in left knee: Secondary | ICD-10-CM | POA: Diagnosis present

## 2017-05-04 DIAGNOSIS — Z6841 Body Mass Index (BMI) 40.0 and over, adult: Secondary | ICD-10-CM | POA: Diagnosis not present

## 2017-05-04 DIAGNOSIS — D631 Anemia in chronic kidney disease: Secondary | ICD-10-CM | POA: Diagnosis present

## 2017-05-04 DIAGNOSIS — I248 Other forms of acute ischemic heart disease: Secondary | ICD-10-CM | POA: Diagnosis present

## 2017-05-04 DIAGNOSIS — Z7982 Long term (current) use of aspirin: Secondary | ICD-10-CM

## 2017-05-04 DIAGNOSIS — I5032 Chronic diastolic (congestive) heart failure: Secondary | ICD-10-CM | POA: Diagnosis present

## 2017-05-04 DIAGNOSIS — T380X5A Adverse effect of glucocorticoids and synthetic analogues, initial encounter: Secondary | ICD-10-CM | POA: Diagnosis not present

## 2017-05-04 DIAGNOSIS — N179 Acute kidney failure, unspecified: Secondary | ICD-10-CM

## 2017-05-04 DIAGNOSIS — E1122 Type 2 diabetes mellitus with diabetic chronic kidney disease: Secondary | ICD-10-CM | POA: Diagnosis present

## 2017-05-04 DIAGNOSIS — J9601 Acute respiratory failure with hypoxia: Secondary | ICD-10-CM | POA: Diagnosis not present

## 2017-05-04 DIAGNOSIS — E785 Hyperlipidemia, unspecified: Secondary | ICD-10-CM | POA: Diagnosis present

## 2017-05-04 DIAGNOSIS — Z7984 Long term (current) use of oral hypoglycemic drugs: Secondary | ICD-10-CM

## 2017-05-04 DIAGNOSIS — J45901 Unspecified asthma with (acute) exacerbation: Secondary | ICD-10-CM | POA: Diagnosis present

## 2017-05-04 DIAGNOSIS — Z66 Do not resuscitate: Secondary | ICD-10-CM | POA: Diagnosis present

## 2017-05-04 DIAGNOSIS — I509 Heart failure, unspecified: Secondary | ICD-10-CM

## 2017-05-04 DIAGNOSIS — I130 Hypertensive heart and chronic kidney disease with heart failure and stage 1 through stage 4 chronic kidney disease, or unspecified chronic kidney disease: Secondary | ICD-10-CM | POA: Diagnosis present

## 2017-05-04 DIAGNOSIS — M25561 Pain in right knee: Secondary | ICD-10-CM | POA: Diagnosis present

## 2017-05-04 DIAGNOSIS — F419 Anxiety disorder, unspecified: Secondary | ICD-10-CM | POA: Diagnosis present

## 2017-05-04 DIAGNOSIS — R0602 Shortness of breath: Secondary | ICD-10-CM | POA: Diagnosis not present

## 2017-05-04 DIAGNOSIS — J9621 Acute and chronic respiratory failure with hypoxia: Secondary | ICD-10-CM | POA: Diagnosis present

## 2017-05-04 DIAGNOSIS — E872 Acidosis: Secondary | ICD-10-CM | POA: Diagnosis present

## 2017-05-04 DIAGNOSIS — N183 Chronic kidney disease, stage 3 (moderate): Secondary | ICD-10-CM | POA: Diagnosis present

## 2017-05-04 DIAGNOSIS — J9600 Acute respiratory failure, unspecified whether with hypoxia or hypercapnia: Secondary | ICD-10-CM

## 2017-05-04 LAB — MRSA PCR SCREENING: MRSA by PCR: NEGATIVE

## 2017-05-04 LAB — COMPREHENSIVE METABOLIC PANEL WITH GFR
ALT: 21 U/L (ref 14–54)
AST: 33 U/L (ref 15–41)
Albumin: 3.4 g/dL — ABNORMAL LOW (ref 3.5–5.0)
Alkaline Phosphatase: 61 U/L (ref 38–126)
Anion gap: 13 (ref 5–15)
BUN: 29 mg/dL — ABNORMAL HIGH (ref 6–20)
CO2: 23 mmol/L (ref 22–32)
Calcium: 9.1 mg/dL (ref 8.9–10.3)
Chloride: 110 mmol/L (ref 101–111)
Creatinine, Ser: 1.4 mg/dL — ABNORMAL HIGH (ref 0.44–1.00)
GFR calc Af Amer: 42 mL/min — ABNORMAL LOW
GFR calc non Af Amer: 36 mL/min — ABNORMAL LOW
Glucose, Bld: 202 mg/dL — ABNORMAL HIGH (ref 65–99)
Potassium: 4.2 mmol/L (ref 3.5–5.1)
Sodium: 146 mmol/L — ABNORMAL HIGH (ref 135–145)
Total Bilirubin: 0.5 mg/dL (ref 0.3–1.2)
Total Protein: 7.1 g/dL (ref 6.5–8.1)

## 2017-05-04 LAB — PROTIME-INR
INR: 1.01
Prothrombin Time: 13.3 s (ref 11.4–15.2)

## 2017-05-04 LAB — TROPONIN I
TROPONIN I: 0.19 ng/mL — AB (ref <0.03)
Troponin I: 0.1 ng/mL (ref <0.03)

## 2017-05-04 LAB — CBC WITH DIFFERENTIAL/PLATELET
Basophils Absolute: 0.1 10*3/uL (ref 0–0.1)
Basophils Relative: 1 %
Eosinophils Absolute: 0.3 10*3/uL (ref 0–0.7)
Eosinophils Relative: 2 %
HCT: 34.9 % — ABNORMAL LOW (ref 35.0–47.0)
Hemoglobin: 11.2 g/dL — ABNORMAL LOW (ref 12.0–16.0)
Lymphocytes Relative: 32 %
Lymphs Abs: 4.5 10*3/uL — ABNORMAL HIGH (ref 1.0–3.6)
MCH: 29.2 pg (ref 26.0–34.0)
MCHC: 32.2 g/dL (ref 32.0–36.0)
MCV: 90.5 fL (ref 80.0–100.0)
Monocytes Absolute: 0.9 10*3/uL (ref 0.2–0.9)
Monocytes Relative: 6 %
Neutro Abs: 8.4 10*3/uL — ABNORMAL HIGH (ref 1.4–6.5)
Neutrophils Relative %: 59 %
Platelets: 342 10*3/uL (ref 150–440)
RBC: 3.86 MIL/uL (ref 3.80–5.20)
RDW: 15.5 % — ABNORMAL HIGH (ref 11.5–14.5)
WBC: 14 10*3/uL — ABNORMAL HIGH (ref 3.6–11.0)

## 2017-05-04 LAB — EXPECTORATED SPUTUM ASSESSMENT W REFEX TO RESP CULTURE

## 2017-05-04 LAB — PROCALCITONIN

## 2017-05-04 LAB — EXPECTORATED SPUTUM ASSESSMENT W GRAM STAIN, RFLX TO RESP C

## 2017-05-04 LAB — LACTIC ACID, PLASMA
LACTIC ACID, VENOUS: 1.9 mmol/L (ref 0.5–1.9)
Lactic Acid, Venous: 3.7 mmol/L (ref 0.5–1.9)
Lactic Acid, Venous: 1.5 mmol/L (ref 0.5–1.9)

## 2017-05-04 LAB — GLUCOSE, CAPILLARY: GLUCOSE-CAPILLARY: 148 mg/dL — AB (ref 65–99)

## 2017-05-04 MED ORDER — ENOXAPARIN SODIUM 30 MG/0.3ML ~~LOC~~ SOLN
30.0000 mg | SUBCUTANEOUS | Status: DC
  Administered 2017-05-04: 30 mg via SUBCUTANEOUS
  Filled 2017-05-04: qty 0.3

## 2017-05-04 MED ORDER — AZITHROMYCIN 250 MG PO TABS: 250.0000 mg | ORAL_TABLET | Freq: Every day | ORAL | Status: DC

## 2017-05-04 MED ORDER — ACETAMINOPHEN 650 MG RE SUPP: 650.0000 mg | Freq: Four times a day (QID) | RECTAL | Status: DC | PRN

## 2017-05-04 MED ORDER — AZITHROMYCIN 500 MG PO TABS
500.0000 mg | ORAL_TABLET | Freq: Every day | ORAL | Status: AC
  Administered 2017-05-04: 500 mg via ORAL
  Filled 2017-05-04: qty 1

## 2017-05-04 MED ORDER — MIRTAZAPINE 15 MG PO TABS
45.0000 mg | ORAL_TABLET | Freq: Every day | ORAL | Status: DC
  Administered 2017-05-04 – 2017-05-07 (×4): 45 mg via ORAL
  Filled 2017-05-04 (×4): qty 3

## 2017-05-04 MED ORDER — LACTULOSE 10 GM/15ML PO SOLN
30.0000 g | Freq: Every day | ORAL | Status: DC
  Administered 2017-05-07: 30 g via ORAL
  Filled 2017-05-04 (×4): qty 60

## 2017-05-04 MED ORDER — OCUVITE-LUTEIN PO CAPS
1.0000 | ORAL_CAPSULE | Freq: Every day | ORAL | Status: DC
  Administered 2017-05-05 – 2017-05-08 (×4): 1 via ORAL
  Filled 2017-05-04 (×4): qty 1

## 2017-05-04 MED ORDER — HYDRALAZINE HCL 50 MG PO TABS
50.0000 mg | ORAL_TABLET | Freq: Three times a day (TID) | ORAL | Status: DC
  Administered 2017-05-05 – 2017-05-06 (×2): 50 mg via ORAL
  Filled 2017-05-04 (×2): qty 1

## 2017-05-04 MED ORDER — IPRATROPIUM-ALBUTEROL 0.5-2.5 (3) MG/3ML IN SOLN
3.0000 mL | Freq: Four times a day (QID) | RESPIRATORY_TRACT | Status: DC
  Administered 2017-05-04 – 2017-05-05 (×3): 3 mL via RESPIRATORY_TRACT
  Filled 2017-05-04 (×3): qty 3

## 2017-05-04 MED ORDER — ORAL CARE MOUTH RINSE
15.0000 mL | Freq: Two times a day (BID) | OROMUCOSAL | Status: DC
  Administered 2017-05-04 – 2017-05-08 (×5): 15 mL via OROMUCOSAL

## 2017-05-04 MED ORDER — VERAPAMIL HCL ER 180 MG PO TBCR: 180.0000 mg | EXTENDED_RELEASE_TABLET | Freq: Every day | ORAL | Status: DC

## 2017-05-04 MED ORDER — INSULIN ASPART 100 UNIT/ML ~~LOC~~ SOLN
0.0000 [IU] | Freq: Three times a day (TID) | SUBCUTANEOUS | Status: DC
  Administered 2017-05-05 – 2017-05-06 (×5): 2 [IU] via SUBCUTANEOUS
  Administered 2017-05-07: 1 [IU] via SUBCUTANEOUS
  Administered 2017-05-07: 3 [IU] via SUBCUTANEOUS
  Administered 2017-05-07: 7 [IU] via SUBCUTANEOUS
  Administered 2017-05-08: 2 [IU] via SUBCUTANEOUS
  Filled 2017-05-04 (×9): qty 1

## 2017-05-04 MED ORDER — VERAPAMIL HCL ER 180 MG PO TBCR
180.0000 mg | EXTENDED_RELEASE_TABLET | Freq: Every day | ORAL | Status: DC
  Administered 2017-05-05 – 2017-05-08 (×4): 180 mg via ORAL
  Filled 2017-05-04 (×4): qty 1

## 2017-05-04 MED ORDER — METHYLPREDNISOLONE SODIUM SUCC 40 MG IJ SOLR
40.0000 mg | Freq: Every day | INTRAMUSCULAR | Status: DC
  Administered 2017-05-04: 40 mg via INTRAVENOUS
  Filled 2017-05-04: qty 1

## 2017-05-04 MED ORDER — ATORVASTATIN CALCIUM 20 MG PO TABS
40.0000 mg | ORAL_TABLET | Freq: Every day | ORAL | Status: DC
  Administered 2017-05-04 – 2017-05-07 (×4): 40 mg via ORAL
  Filled 2017-05-04 (×6): qty 2

## 2017-05-04 MED ORDER — INSULIN ASPART 100 UNIT/ML ~~LOC~~ SOLN: 0.0000 [IU] | Freq: Every day | SUBCUTANEOUS | Status: DC

## 2017-05-04 MED ORDER — LORATADINE 10 MG PO TABS
10.0000 mg | ORAL_TABLET | Freq: Every day | ORAL | Status: DC
  Administered 2017-05-05 – 2017-05-08 (×4): 10 mg via ORAL
  Filled 2017-05-04 (×4): qty 1

## 2017-05-04 MED ORDER — FUROSEMIDE 10 MG/ML IJ SOLN
40.0000 mg | Freq: Every day | INTRAMUSCULAR | Status: DC
  Administered 2017-05-04 – 2017-05-07 (×3): 40 mg via INTRAVENOUS
  Filled 2017-05-04 (×3): qty 4

## 2017-05-04 MED ORDER — ASPIRIN EC 81 MG PO TBEC
81.0000 mg | DELAYED_RELEASE_TABLET | Freq: Every day | ORAL | Status: DC
  Administered 2017-05-05 – 2017-05-08 (×4): 81 mg via ORAL
  Filled 2017-05-04 (×4): qty 1

## 2017-05-04 MED ORDER — DEXTROSE 5 % IV SOLN
1.0000 g | INTRAVENOUS | Status: DC
  Administered 2017-05-04: 1 g via INTRAVENOUS
  Filled 2017-05-04: qty 10

## 2017-05-04 MED ORDER — BUDESONIDE 0.5 MG/2ML IN SUSP
0.5000 mg | Freq: Two times a day (BID) | RESPIRATORY_TRACT | Status: DC
  Administered 2017-05-04 – 2017-05-08 (×8): 0.5 mg via RESPIRATORY_TRACT
  Filled 2017-05-04 (×9): qty 2

## 2017-05-04 MED ORDER — MONTELUKAST SODIUM 10 MG PO TABS
10.0000 mg | ORAL_TABLET | Freq: Every day | ORAL | Status: DC
  Administered 2017-05-04 – 2017-05-07 (×4): 10 mg via ORAL
  Filled 2017-05-04 (×4): qty 1

## 2017-05-04 MED ORDER — ACETAMINOPHEN 325 MG PO TABS: 650.0000 mg | ORAL_TABLET | Freq: Four times a day (QID) | ORAL | Status: DC | PRN

## 2017-05-04 NOTE — Progress Notes (Signed)
PHARMACIST - PHYSICIAN COMMUNICATION  CONCERNING:  Enoxaparin (Lovenox) for DVT Prophylaxis    RECOMMENDATION: Patient was prescribed enoxaprin 40mg  q24 hours for VTE prophylaxis.   Patient Calculated CrCl is 72ml/min (Scr: 1.4)   Patient is candidate for enoxaparin 30mg  every 24 hours based on CrCl <74ml/min.  DESCRIPTION: Pharmacy has adjusted enoxaparin dose per [facility] policy.  Patient is now receiving enoxaparin 30mg  every 24 hours.  Nancy Fetter, PharmD Clinical Pharmacist  05/04/2017 7:16 PM

## 2017-05-04 NOTE — Consult Note (Signed)
Name: Angela Pham MRN: [id number] DOB: [birth year]    ADMISSION DATE:  05/04/2017 CONSULTATION DATE: 05/04/2017  REFERRING MD : Dr. Leslye Peer   CHIEF COMPLAINT: Shortness of Breath   BRIEF PATIENT DESCRIPTION:  76 year old female admitted in 08/8 with acute on chronic respiratory failure secondary to CHF vs. asthma exacerbation and questionable pneumonia requiring continuous BiPAP.  SIGNIFICANT EVENTS  08/8-Patient admitted to the stepdown unit  STUDIES:  None   HISTORY OF PRESENT ILLNESS:   This is a 76 year old female with a past medical history of type 2 diabetes mellitus, morbid obesity, hypertension, hyperlipidemia, combined CHF, and asthma.  She presented to [facility] ER 08/8 with complaints of shortness of breath, wheezing, productive cough, coughing up thick green phlegm, and chills onset of symptoms several days prior to presentation. Due to symptoms she notified EMS upon their arrival she received 125 mg iv solumedrol and 2 duoneb treatments.  In the ER due to persistent shortness of breath patient placed on continuous Bipap chest x-ray revealing pulmonary edema and possible pneumonia, initial troponin slightly elevated at 0.10, and lactic acid 3.7.  She was subsequently admitted to the stepdown unit by hospitalist team for further workup and treatment PCCM consulted.  PAST MEDICAL HISTORY :   has a past medical history of Asthma; CHF (congestive heart failure) ([place]); Hyperlipidemia; Hypertension; Morbid obesity ([place]); and Type II diabetes mellitus ([place]).  has a past surgical history that includes c-section and RIGHT/LEFT HEART CATH AND CORONARY ANGIOGRAPHY (N/A, 03/09/2017). Prior to Admission medications   Medication Sig Start Date End Date Taking? Authorizing Provider  aspirin EC 81 MG EC tablet Take 1 tablet (81 mg total) by mouth daily. 03/12/17  Yes Mody, Ulice Bold, MD  atorvastatin (LIPITOR) 40 MG tablet Take 40 mg by mouth daily.   Yes [provider]  furosemide (LASIX) 40  MG tablet Take 1 tablet (40 mg total) by mouth daily. 03/13/17  Yes Mody, Ulice Bold, MD  hydrALAZINE (APRESOLINE) 50 MG tablet Take 1 tablet (50 mg total) by mouth every 8 (eight) hours. Patient taking differently: Take 50 mg by mouth daily.  03/11/17  Yes Mody, Sital, MD  INCRUSE ELLIPTA 62.5 MCG/INH AEPB Inhale 1 puff into the lungs daily. 03/16/17  Yes [provider]  Ipratropium-Albuterol (COMBIVENT IN) Inhale 3 puffs into the lungs every 4 (four) hours as needed.   Yes [provider]  ipratropium-albuterol (DUONEB) 0.5-2.5 (3) MG/3ML SOLN Inhale 3 mLs into the lungs every 4 (four) hours as needed.   Yes [provider]  lactulose (CHRONULAC) 10 GM/15ML solution Take 30 g by mouth daily.   Yes [provider]  levocetirizine (XYZAL) 5 MG tablet Take 10 mg by mouth daily. 03/18/17  Yes [provider]  metFORMIN (GLUCOPHAGE) 850 MG tablet Take 1 tablet by mouth 2 (two) times daily.   Yes [provider]  mirtazapine (REMERON) 45 MG tablet Take 1 tablet by mouth at bedtime.   Yes [provider]  montelukast (SINGULAIR) 10 MG tablet Take 1 tablet (10 mg total) by mouth at bedtime. 03/11/17  Yes Mody, Ulice Bold, MD  Multiple Vitamins-Minerals (EYE VITAMINS PO) Take 1 tablet by mouth daily.   Yes [provider]  predniSONE (DELTASONE) 50 MG tablet Take 1 tablet for 4 days then stop Patient not taking: Reported on 05/04/2017 03/12/17   [name], MD  tiotropium (SPIRIVA) 18 MCG inhalation capsule Place 1 capsule (18 mcg total) into inhaler and inhale daily. Patient not taking: Reported on 05/04/2017 03/12/17  [name], MD  verapamil (CALAN-SR) 180 MG CR tablet Take 2 tablets (360 mg total) by mouth daily. 03/12/17 04/10/17  [name], MD   Allergies  Allergen Reactions  . Ace Inhibitors Anaphylaxis    Tongue swelling  . Shellfish Allergy Hives and Swelling    Swelling around face and mouth     FAMILY HISTORY:  family history  includes CAD in her father; Cervical cancer in her sister; Colon cancer in her sister; Leukemia in her brother; Throat cancer in her brother. SOCIAL HISTORY:  reports that she has never smoked. She has never used smokeless tobacco. She reports that she does not drink alcohol or use drugs.  REVIEW OF SYSTEMS: Positives in BOLD Constitutional: fever, chills, weight loss, malaise/fatigue and diaphoresis.  HENT: Negative for hearing loss, ear pain, nosebleeds, congestion, sore throat, neck pain, tinnitus and ear discharge.   Eyes: Negative for blurred vision, double vision, photophobia, pain, discharge and redness.  Respiratory: cough, hemoptysis, sputum production, shortness of breath, wheezing and stridor.   Cardiovascular: chest pain with coughing, palpitations, orthopnea, claudication, leg swelling and PND.  Gastrointestinal: Negative for heartburn, nausea, vomiting, abdominal pain, diarrhea, constipation, blood in stool and melena.  Genitourinary: Negative for dysuria, urgency, frequency, hematuria and flank pain.  Musculoskeletal: Negative for myalgias, back pain, joint pain and falls.  Skin: Negative for itching and rash.  Neurological: Negative for dizziness, tingling, tremors, sensory change, speech change, focal weakness, seizures, loss of consciousness, weakness and headaches.  Endo/Heme/Allergies: Negative for environmental allergies and polydipsia. Does not bruise/bleed easily.  SUBJECTIVE:  Patient states breathing has improved she is requesting something to drink and would like to remove the BiPAP.  VITAL SIGNS:    PHYSICAL EXAMINATION: General: Well-developed, well-nourished, obese Caucasian female, NAD Neuro: Alert and oriented, follows commands HEENT: Supple, no JVD Cardiovascular: Sinus tach with bundle branch block, no M/R/G Lungs: Diminished throughout, even, nonlabored; no wheezes, rales, rhonchi, or crackles on BiPAP Abdomen: Positive bowel sounds 4, soft, obese,  nontender, nondistended Musculoskeletal: Normal bulk and tone, no edema Skin: Intact no rashes or lesions  No results for input(s): NA, K, CL, CO2, BUN, CREATININE, GLUCOSE in the last 168 hours. No results for input(s): HGB, HCT, WBC, PLT in the last 168 hours. No results found.  ASSESSMENT / PLAN: Acute on chronic respiratory failure secondary to CHF vs. asthma exacerbation and questionable pneumonia Lactic acidosis Elevated troponins secondary to demand ischemia versus NSTEMI Hx: Type II Diabetes Mellitus and HTN  P: Prn BiPAP or supplemental O2 for dyspnea Maintain O2 sats greater than 92% Repeat chest x-ray in the a.m Continue IV Lasix 40 mg daily Scheduled bronchodilator therapy and nebulized steroids IV steroids wean as tolerated Continue empiric antibiotics Obtain sputum culture Trend WBC and monitor fever curve Trend PCT and lactic acid Continuous telemetry monitoring Trend troponins Trend BMP Replace electrolytes as indicated Monitor urinary output CBG's ac/hs and SSI   Marda Stalker, [place] Pager [phone] (please enter 7 digits) PCCM Consult Pager [phone] (please enter 7 digits)

## 2017-05-04 NOTE — ED Notes (Signed)
See downtime forms.

## 2017-05-04 NOTE — H&P (Signed)
[place] [facility] [place] NAME: Angela Pham    MR#:  [id number]  DATE OF BIRTH:  Jul 27, 1941  DATE OF ADMISSION:  05/04/2017  PRIMARY CARE PHYSICIAN: Dr. Glynis Smiles  REQUESTING/REFERRING PHYSICIAN: Dr. Lisa Roca  CHIEF COMPLAINT:  Shortness of breath  HISTORY OF PRESENT ILLNESS:  Angela Pham  is a 76 y.o. female with a known history of Asthma. She presents to the ER with shortness of breath and chest pains that have been going on for the last few nights. She needed to sit up at night in order to breathe. She states it started on Monday. He comes and goes. She states this is her worst exacerbation. Chest pains as dull and in the center of her chest and tight to breathe. The patient is also had a 4 pound weight gain. Feeling fatigued. She's been coughing up yellow green mucus. She's been wheezing a lot. In the ER, she was found to have an elevated lactate and placed on BiPAP and put on sepsis protocol.  PAST MEDICAL HISTORY:   Past Medical History:  Diagnosis Date  . Asthma   . CHF (congestive heart failure) ([place])   . Hyperlipidemia   . Hypertension   . Morbid obesity ([place])   . Type II diabetes mellitus ([place])     PAST SURGICAL HISTORY:   Past Surgical History:  Procedure Laterality Date  . c-section    . RIGHT/LEFT HEART CATH AND CORONARY ANGIOGRAPHY N/A 03/09/2017   Procedure: Right/Left Heart Cath and Coronary Angiography;  Surgeon: Minna Merritts, MD;  Location: [facility];  Service: Cardiovascular;  Laterality: N/A;    SOCIAL HISTORY:   Social History  Substance Use Topics  . Smoking status: Never Smoker  . Smokeless tobacco: Never Used  . Alcohol use No    FAMILY HISTORY:   Family History  Problem Relation Age of Onset  . CAD Father   . Colon cancer Sister   . Leukemia Brother   . Throat cancer Brother   . Cervical cancer Sister     DRUG ALLERGIES:   Allergies  Allergen Reactions  . Ace  Inhibitors Anaphylaxis    Tongue swelling  . Shellfish Allergy Hives and Swelling    Swelling around face and mouth     REVIEW OF SYSTEMS:  CONSTITUTIONAL: No fever. Positive for fatigue. Positive for weight gain 4 pounds. Positive for chills and sweats. EYES: No blurred or double vision. Wears glasses. EARS, NOSE, AND THROAT: No tinnitus or ear pain. No sore throat RESPIRATORY: Positive for cough with yellow-green phlegm. Positive shortness of breath, and wheezing. No hemoptysis.  CARDIOVASCULAR: Positive for chest pain, orthopnea and edema.  GASTROINTESTINAL: No nausea, vomiting, diarrhea or abdominal pain. No blood in bowel movements. Positive for constipation GENITOURINARY: No dysuria, hematuria.  ENDOCRINE: No polyuria, nocturia,  HEMATOLOGY: No anemia, easy bruising or bleeding SKIN: No rash or lesion. MUSCULOSKELETAL: No joint pain or arthritis.   NEUROLOGIC: No tingling, numbness, weakness.  PSYCHIATRY: No anxiety or depression.   MEDICATIONS AT HOME:   Prior to Admission medications   Medication Sig Start Date End Date Taking? Authorizing Provider  aspirin EC 81 MG EC tablet Take 1 tablet (81 mg total) by mouth daily. 03/12/17  Yes Mody, Ulice Bold, MD  atorvastatin (LIPITOR) 40 MG tablet Take 40 mg by mouth daily.   Yes [provider]  furosemide (LASIX) 40 MG tablet Take 1 tablet (40 mg total) by mouth daily. 03/13/17  Yes [name], MD  hydrALAZINE (APRESOLINE) 50 MG tablet Take 1 tablet (50 mg total) by mouth every 8 (eight) hours. Patient taking differently: Take 50 mg by mouth daily.  03/11/17  Yes Mody, Sital, MD  INCRUSE ELLIPTA 62.5 MCG/INH AEPB Inhale 1 puff into the lungs daily. 03/16/17  Yes [provider]  Ipratropium-Albuterol (COMBIVENT IN) Inhale 3 puffs into the lungs every 4 (four) hours as needed.   Yes [provider]  ipratropium-albuterol (DUONEB) 0.5-2.5 (3) MG/3ML SOLN Inhale 3 mLs into the lungs every 4 (four) hours as needed.    Yes [provider]  lactulose (CHRONULAC) 10 GM/15ML solution Take 30 g by mouth daily.   Yes [provider]  levocetirizine (XYZAL) 5 MG tablet Take 10 mg by mouth daily. 03/18/17  Yes [provider]  metFORMIN (GLUCOPHAGE) 850 MG tablet Take 1 tablet by mouth 2 (two) times daily.   Yes [provider]  mirtazapine (REMERON) 45 MG tablet Take 1 tablet by mouth at bedtime.   Yes [provider]  montelukast (SINGULAIR) 10 MG tablet Take 1 tablet (10 mg total) by mouth at bedtime. 03/11/17  Yes Mody, Ulice Bold, MD  Multiple Vitamins-Minerals (EYE VITAMINS PO) Take 1 tablet by mouth daily.   Yes [provider]  predniSONE (DELTASONE) 50 MG tablet Take 1 tablet for 4 days then stop Patient not taking: Reported on 05/04/2017 03/12/17   [name], MD  tiotropium (SPIRIVA) 18 MCG inhalation capsule Place 1 capsule (18 mcg total) into inhaler and inhale daily. Patient not taking: Reported on 05/04/2017 03/12/17   [name], MD  verapamil (CALAN-SR) 180 MG CR tablet Take 2 tablets (360 mg total) by mouth daily. 03/12/17 04/10/17  [name], MD      VITAL SIGNS:  There were no vitals taken for this visit.  Patient seen before vital signs were entered into the computer system because the computers were down  PHYSICAL EXAMINATION:  GENERAL:  76 y.o.-year-old patient lying in the bed with acute Respiratory distress.  EYES: Pupils equal, round, reactive to light and accommodation. No scleral icterus. Extraocular muscles intact.  HEENT: Head atraumatic, normocephalic. Oropharynx and nasopharynx clear.  NECK:  Supple, no jugular venous distention. No thyroid enlargement, no tenderness.  LUNGS: Decreased breath sounds bilaterally, upper airway wheezing. Lower airway rales. Positive use of accessory muscles of respiration.  CARDIOVASCULAR: S1, S2 normal. No murmurs, rubs, or gallops.  ABDOMEN: Soft, nontender, nondistended. Bowel sounds present. No  organomegaly or mass.  EXTREMITIES: 2+ edema, no cyanosis, or clubbing.  NEUROLOGIC: Cranial nerves II through XII are intact. Muscle strength 5/5 in all extremities. Sensation intact. Gait not checked.  PSYCHIATRIC: The patient is alert and oriented x 3.  SKIN: No rash, lesion, or ulcer.   LABORATORY PANEL:  Laboratory data did not get reported into the computer system yet  RADIOLOGY:  ER physician felt that there was haziness in the left base  EKG:   Sinus tachycardia 122 bpm  IMPRESSION AND PLAN:   1.  Acute respiratory failure. Patient placed on BiPAP in order to oxygenate. Patient will be admitted to the CCU stepdown. Patient wishes to be a DO NOT RESUSCITATE. Case discussed with Dr. Emmit Alexanders from [facility]. 2. Asthma exacerbation. IV Solu-Medrol 40 mg daily. Budesonide and DuoNeb nebulizer solution. 3.  Clinical sepsis with pneumonia. ER physician ordered vancomycin and Zosyn. I will change antibiotics to Rocephin and Zithromax. 4.  Lactic acidosis. Discontinue metformin. Could be from sepsis or from  metformin. 5. History of diastolic congestive heart failure. Watch closely with IV fluids. Lasix IV daily. 6. Chronic kidney disease stage III. Monitor with her condition. 7. Type 2 diabetes mellitus. Last hemoglobin A1c 5.8. Sliding scale for now. Hold metformin. 8. Morbid obesity. Weight loss needed 9. Essential hypertension tachycardia on verapamil 10. Hyperlipidemia unspecified on atorvastatin  All the records are reviewed and case discussed with ED provider. Management plans discussed with the patient, family and they are in agreement.  CODE STATUS: DO NOT RESUSCITATE  TOTAL TIME TAKING CARE OF THIS PATIENT: 55 minutes. Patient will be admitted to the critical care unit stepdown.    Loletha Grayer M.D on 05/04/2017 at 5:32 PM  Between 7am to 6pm - Pager - [phone]  After 6pm call admission pager [phone]  [facility]  [phone]  CC: Primary care  physician; Dr. Glynis Smiles

## 2017-05-04 NOTE — Progress Notes (Signed)
Pharmacy Antibiotic Note  Angela Pham is a 76 y.o. female admitted on 05/04/2017 with  pneumonia.  Pharmacy has been consulted for Ceftriaxone dosing.Patient is also receiving Azithromycin.   Plan: Will start patient on ceftriaxone 1g IV every 24 hours.    No data recorded.  No results for input(s): WBC, CREATININE, LATICACIDVEN, VANCOTROUGH, VANCOPEAK, VANCORANDOM, GENTTROUGH, GENTPEAK, GENTRANDOM, TOBRATROUGH, TOBRAPEAK, TOBRARND, AMIKACINPEAK, AMIKACINTROU, AMIKACIN in the last 168 hours.  CrCl cannot be calculated (Patient's most recent lab result is older than the maximum 21 days allowed.).    Allergies  Allergen Reactions  . Ace Inhibitors Anaphylaxis    Tongue swelling  . Shellfish Allergy Hives and Swelling    Swelling around face and mouth     Antimicrobials this admission: 8/8 azithromycin >>  8/8 ceftriaxone  >>   Dose adjustments this admission:  Microbiology results:   Thank you for allowing pharmacy to be a part of this patient's care.  Pernell Dupre, PharmD, BCPS Clinical Pharmacist 05/04/2017 5:42 PM

## 2017-05-05 ENCOUNTER — Encounter: Payer: Self-pay | Admitting: *Deleted

## 2017-05-05 ENCOUNTER — Inpatient Hospital Stay: Payer: Medicare Other

## 2017-05-05 DIAGNOSIS — R0602 Shortness of breath: Secondary | ICD-10-CM

## 2017-05-05 DIAGNOSIS — J9601 Acute respiratory failure with hypoxia: Secondary | ICD-10-CM

## 2017-05-05 LAB — PROTEIN / CREATININE RATIO, URINE
CREATININE, URINE: 49 mg/dL
PROTEIN CREATININE RATIO: 2.82 mg/mg{creat} — AB (ref 0.00–0.15)
TOTAL PROTEIN, URINE: 138 mg/dL

## 2017-05-05 LAB — GLUCOSE, CAPILLARY
GLUCOSE-CAPILLARY: 152 mg/dL — AB (ref 65–99)
GLUCOSE-CAPILLARY: 114 mg/dL — AB (ref 65–99)
GLUCOSE-CAPILLARY: 186 mg/dL — AB (ref 65–99)
Glucose-Capillary: 153 mg/dL — ABNORMAL HIGH (ref 65–99)

## 2017-05-05 LAB — BASIC METABOLIC PANEL
ANION GAP: 10 (ref 5–15)
BUN: 29 mg/dL — ABNORMAL HIGH (ref 6–20)
CALCIUM: 8.8 mg/dL — AB (ref 8.9–10.3)
CO2: 25 mmol/L (ref 22–32)
Chloride: 108 mmol/L (ref 101–111)
Creatinine, Ser: 1.38 mg/dL — ABNORMAL HIGH (ref 0.44–1.00)
GFR, EST AFRICAN AMERICAN: 43 mL/min — AB (ref >60)
GFR, EST NON AFRICAN AMERICAN: 37 mL/min — AB (ref >60)
Glucose, Bld: 217 mg/dL — ABNORMAL HIGH (ref 65–99)
Potassium: 3.9 mmol/L (ref 3.5–5.1)
Sodium: 143 mmol/L (ref 135–145)

## 2017-05-05 LAB — CBC
HCT: 31.6 % — ABNORMAL LOW (ref 35.0–47.0)
HEMOGLOBIN: 10.6 g/dL — AB (ref 12.0–16.0)
MCH: 29.9 pg (ref 26.0–34.0)
MCHC: 33.5 g/dL (ref 32.0–36.0)
MCV: 89.1 fL (ref 80.0–100.0)
Platelets: 271 10*3/uL (ref 150–440)
RBC: 3.55 MIL/uL — AB (ref 3.80–5.20)
RDW: 15.4 % — ABNORMAL HIGH (ref 11.5–14.5)
WBC: 10.8 10*3/uL (ref 3.6–11.0)

## 2017-05-05 LAB — TROPONIN I
Troponin I: 0.21 ng/mL (ref <0.03)
Troponin I: 0.2 ng/mL (ref <0.03)

## 2017-05-05 LAB — IRON AND TIBC
IRON: 34 ug/dL (ref 28–170)
Saturation Ratios: 10 % — ABNORMAL LOW (ref 10.4–31.8)
TIBC: 341 ug/dL (ref 250–450)
UIBC: 307 ug/dL

## 2017-05-05 LAB — FERRITIN: Ferritin: 14 ng/mL (ref 11–307)

## 2017-05-05 MED ORDER — LORAZEPAM 2 MG/ML IJ SOLN
1.0000 mg | Freq: Once | INTRAMUSCULAR | Status: AC
  Administered 2017-05-05: 1 mg via INTRAVENOUS
  Filled 2017-05-05: qty 1

## 2017-05-05 MED ORDER — METHYLPREDNISOLONE SODIUM SUCC 125 MG IJ SOLR: 60.0000 mg | Freq: Four times a day (QID) | INTRAMUSCULAR | Status: DC

## 2017-05-05 MED ORDER — METHYLPREDNISOLONE SODIUM SUCC 125 MG IJ SOLR
125.0000 mg | Freq: Once | INTRAMUSCULAR | Status: DC
  Filled 2017-05-05: qty 2

## 2017-05-05 MED ORDER — IPRATROPIUM-ALBUTEROL 0.5-2.5 (3) MG/3ML IN SOLN
3.0000 mL | Freq: Once | RESPIRATORY_TRACT | Status: AC
Start: 2017-05-05 — End: 2017-05-05
  Administered 2017-05-05: 3 mL via RESPIRATORY_TRACT
  Filled 2017-05-05: qty 3

## 2017-05-05 MED ORDER — METOPROLOL TARTRATE 5 MG/5ML IV SOLN
2.5000 mg | INTRAVENOUS | Status: DC | PRN
  Administered 2017-05-05 – 2017-05-08 (×2): 5 mg via INTRAVENOUS
  Filled 2017-05-05: qty 5

## 2017-05-05 MED ORDER — ALPRAZOLAM 0.25 MG PO TABS
0.2500 mg | ORAL_TABLET | Freq: Three times a day (TID) | ORAL | Status: DC | PRN
Start: 2017-05-05 — End: 2017-05-08
  Administered 2017-05-05 – 2017-05-07 (×2): 0.25 mg via ORAL
  Filled 2017-05-05 (×2): qty 1

## 2017-05-05 MED ORDER — IPRATROPIUM-ALBUTEROL 0.5-2.5 (3) MG/3ML IN SOLN
3.0000 mL | RESPIRATORY_TRACT | Status: DC
  Administered 2017-05-05 – 2017-05-07 (×13): 3 mL via RESPIRATORY_TRACT
  Filled 2017-05-05 (×14): qty 3

## 2017-05-05 MED ORDER — METHYLPREDNISOLONE SODIUM SUCC 125 MG IJ SOLR
INTRAMUSCULAR | Status: AC
  Administered 2017-05-05: 125 mg
  Filled 2017-05-05: qty 2

## 2017-05-05 MED ORDER — GUAIFENESIN 100 MG/5ML PO SOLN
5.0000 mL | ORAL | Status: DC | PRN
  Administered 2017-05-06 – 2017-05-07 (×2): 100 mg via ORAL
  Filled 2017-05-05 (×3): qty 5

## 2017-05-05 MED ORDER — METHYLPREDNISOLONE SODIUM SUCC 40 MG IJ SOLR
20.0000 mg | INTRAMUSCULAR | Status: DC
  Administered 2017-05-05: 20 mg via INTRAVENOUS
  Filled 2017-05-05: qty 1

## 2017-05-05 MED ORDER — ENOXAPARIN SODIUM 40 MG/0.4ML ~~LOC~~ SOLN
40.0000 mg | SUBCUTANEOUS | Status: DC
  Administered 2017-05-05 – 2017-05-07 (×3): 40 mg via SUBCUTANEOUS
  Filled 2017-05-05 (×3): qty 0.4

## 2017-05-05 MED ORDER — METOPROLOL TARTRATE 5 MG/5ML IV SOLN
INTRAVENOUS | Status: AC
  Administered 2017-05-05: 5 mg
  Filled 2017-05-05: qty 5

## 2017-05-05 MED ORDER — METHYLPREDNISOLONE SODIUM SUCC 125 MG IJ SOLR
60.0000 mg | Freq: Four times a day (QID) | INTRAMUSCULAR | Status: DC
  Administered 2017-05-05 – 2017-05-06 (×2): 60 mg via INTRAVENOUS
  Filled 2017-05-05 (×2): qty 2

## 2017-05-05 NOTE — Progress Notes (Signed)
At approximately 1300 hours, this patient attempted to use bed pan. She became short of breath, HR in the 130's, SpO2 90% on O2 @ 4 lpm, pursed-lip breathing, sitting forward in bed, holding side rails. Patient's breathing was coached, assisted w/ repositioning. I spoek w/ Dr. Anselm Jungling who ordered neb tx and alprazolam; he came to evaluate this patient. Neb tx and alprazolam were administered w/ relief of s/s. At 1525, the patient was unable to use bed pan, requested to use BSC. She was assisted to same w/ some increased work of breathing, HR in the 120's, SpO2 on 4 lpm was 94%. Patient voided 1250 mL, was assisted back to bed, stated much relief after voiding. At this time, patient is eating dinner w/ minimal increase in work of breathing, SpO2 in the mid-90's on 4 lpm, HR continues to be 1-teens to 120's. Spoke w/ Hinton Dyer, NP & Dr. Anselm Jungling; patient will be transferred back to stepdown status and remain in ICU room 3. RN for 242 who I had already given report to was notified of same. Patient was update on her plan of care, and agreed to same.

## 2017-05-05 NOTE — Progress Notes (Signed)
Anticoagulation monitoring(Lovenox):  76yo  female ordered Lovenox 30 mg Q24h for DVT prevention, dose reduced for CrCl less than 14ml/min on admission.  Filed Weights   05/04/17 1811  Weight: 188 lb 15 oz (85.7 kg)     Lab Results  Component Value Date   CREATININE 1.38 (H) 05/05/2017   CREATININE 1.40 (H) 05/04/2017   CREATININE 1.36 (H) 03/11/2017   Estimated Creatinine Clearance: 32.9 mL/min (A) (by C-G formula based on SCr of 1.38 mg/dL (H)). Hemoglobin & Hematocrit     Component Value Date/Time   HGB 10.6 (L) 05/05/2017 0026   HCT 31.6 (L) 05/05/2017 0026   Renal function with some improvement, est. CrCl of 33 ml/min.  Per Protocol for Patient with estCrcl > 30 ml/min and BMI < 40, will transition to Lovenox 40 mg Q24h     Paulina Fusi, PharmD, BCPS 05/05/2017 9:19 AM

## 2017-05-05 NOTE — Progress Notes (Signed)
[place] [facility] [place] NAME: Angela Pham    MR#:  [id number]  DATE OF BIRTH:  January 01, 1941  SUBJECTIVE:  CHIEF COMPLAINT:  Came with Shortness of breath, have Hx of asthma and had exacerbation. Required Bipap on admission, felt better and off Bipap now. Have some on-off anxiety episodes with tachycardia and tachypnea.  REVIEW OF SYSTEMS:  CONSTITUTIONAL: No fever, fatigue or weakness.  EYES: No blurred or double vision.  EARS, NOSE, AND THROAT: No tinnitus or ear pain.  RESPIRATORY: positive for cough, shortness of breath, wheezing, no hemoptysis.  CARDIOVASCULAR: No chest pain, orthopnea, edema.  GASTROINTESTINAL: No nausea, vomiting, diarrhea or abdominal pain.  GENITOURINARY: No dysuria, hematuria.  ENDOCRINE: No polyuria, nocturia,  HEMATOLOGY: No anemia, easy bruising or bleeding SKIN: No rash or lesion. MUSCULOSKELETAL: No joint pain or arthritis.   NEUROLOGIC: No tingling, numbness, weakness.  PSYCHIATRY: No anxiety or depression.   ROS  DRUG ALLERGIES:   Allergies  Allergen Reactions  . Ace Inhibitors Anaphylaxis    Tongue swelling  . Shellfish Allergy Hives and Swelling    Swelling around face and mouth     VITALS:  Blood pressure (!) 119/57, pulse (!) 112, temperature 97.9 F (36.6 C), temperature source Axillary, resp. rate (!) 26, height 4\' 9"  (1.448 m), weight 85.7 kg (188 lb 15 oz), SpO2 98 %.  PHYSICAL EXAMINATION:  GENERAL:  76 y.o.-year-old patient lying in the bed with no acute distress.  EYES: Pupils equal, round, reactive to light and accommodation. No scleral icterus. Extraocular muscles intact.  HEENT: Head atraumatic, normocephalic. Oropharynx and nasopharynx clear.  NECK:  Supple, no jugular venous distention. No thyroid enlargement, no tenderness.  LUNGS: Normal breath sounds bilaterally, bilateral wheezing, no crepitation. No use of accessory muscles of respiration.  CARDIOVASCULAR: S1, S2 normal. No  murmurs, rubs, or gallops.  ABDOMEN: Soft, nontender, nondistended. Bowel sounds present. No organomegaly or mass.  EXTREMITIES: No pedal edema, cyanosis, or clubbing.  NEUROLOGIC: Cranial nerves II through XII are intact. Muscle strength 4/5 in all extremities. Sensation intact. Gait not checked.  PSYCHIATRIC: The patient is alert and oriented x 3.  SKIN: No obvious rash, lesion, or ulcer.   Physical Exam LABORATORY PANEL:   CBC  Recent Labs Lab 05/05/17 0026  WBC 10.8  HGB 10.6*  HCT 31.6*  PLT 271   ------------------------------------------------------------------------------------------------------------------  Chemistries   Recent Labs Lab 05/04/17 1454 05/05/17 0026  NA 146* 143  K 4.2 3.9  CL 110 108  CO2 23 25  GLUCOSE 202* 217*  BUN 29* 29*  CREATININE 1.40* 1.38*  CALCIUM 9.1 8.8*  AST 33  --   ALT 21  --   ALKPHOS 61  --   BILITOT 0.5  --    ------------------------------------------------------------------------------------------------------------------  Cardiac Enzymes  Recent Labs Lab 05/05/17 0026 05/05/17 0608  TROPONINI 0.21* 0.20*   ------------------------------------------------------------------------------------------------------------------  RADIOLOGY:  US Renal  Result Date: 05/05/2017 CLINICAL DATA:  Acute renal failure, history hypertension, type II diabetes mellitus, CHF, hyperlipidemia EXAM: RENAL / URINARY TRACT ULTRASOUND COMPLETE COMPARISON:  None FINDINGS: Right Kidney: Length: 9.6 cm. Normal cortical thickness. Significantly increased cortical echogenicity. No mass, hydronephrosis or shadowing calcification. Left Kidney: Length: 9.9 cm. Normal cortical thickness. Diffusely increased cortical echogenicity. No hydronephrosis or shadowing calcification. Small cysts at the lateral mid LEFT kidney 9 x 10 x 8 mm and at the inferior pole 12 x 12 x 9 mm, both containing scattered low level internal echoes, appearing minimally  complicated. Bladder: Appear distended with calculated volume of 980 mL. Patient was unable to void. No bladder wall thickening or obvious mass. IMPRESSION: Medical renal disease changes of both kidneys. Small mildly complicated appearing cysts in the LEFT kidney. Distended urinary bladder. Electronically Signed   By: Lavonia Dana M.D.   On: 05/05/2017 11:55   Dg Chest Port 1 View  Result Date: 05/05/2017 CLINICAL DATA:  Acute respiratory failure. EXAM: PORTABLE CHEST 1 VIEW COMPARISON:  Chest radiograph May 04, 2017 FINDINGS: Cardiac silhouette is similarly enlarged. Mediastinal silhouette is nonsuspicious. Pulmonary vascular congestion and mild interstitial prominence. Slight blunting of the costophrenic angles. No pneumothorax. Faint calcification in the neck are likely vascular. Mildly widened RIGHT acromioclavicular joint space. Soft tissue planes are nonsuspicious. IMPRESSION: Mild cardiomegaly and interstitial prominence compatible with pulmonary edema. Trace suspected pleural effusions. Electronically Signed   By: Elon Alas M.D.   On: 05/05/2017 05:38   Dg Chest Port 1 View  Result Date: 05/04/2017 CLINICAL DATA:  severe shortness of breath. EXAM: PORTABLE CHEST 1 VIEW COMPARISON:  03/07/17 FINDINGS: There is moderate cardiac enlargement. There is mild increase interstitial markings compatible with edema. Hazy infiltrate is noted within the right lung base. IMPRESSION: 1. Right lung base opacity.  Cannot rule out pneumonia. 2. Cardiac enlargement and mild pulmonary edema. Electronically Signed   By: Kerby Moors M.D.   On: 05/04/2017 17:45    ASSESSMENT AND PLAN:   Active Problems:   Acute respiratory failure with hypoxia (HCC)   SOB (shortness of breath)  1.  Acute respiratory failure. Patient placed on BiPAP in order to oxygenate. She have wish to DO NOT RESUSCITATE.   Improved in ICU off bipap now  - on nasal canula oxygen.  2. Asthma exacerbation. IV Solu-Medrol 40 mg daily.  Budesonide and DuoNeb nebulizer solution. 3.  Clinical sepsis with pneumonia- suspected on admission , but ruled out.  Was On Rocephin and Zithromax. Intensivist stopped Abx, as less likely. 4.  Lactic acidosis. Discontinue metformin. Could be from sepsis or from metformin. 5. History of diastolic congestive heart failure. Watch closely with IV fluids. Lasix IV daily. 6. Chronic kidney disease stage III. Slight worsening in renal func.   Baseline creatinine is 1.0- appreciated nephrology consult. 7. Type 2 diabetes mellitus. Last hemoglobin A1c 5.8. Sliding scale for now. Hold metformin. 8. Morbid obesity. Weight loss needed 9. Essential hypertension tachycardia on verapamil 10. Hyperlipidemia unspecified on atorvastatin 11. Anxiety- started low dose xanax to help.  All the records are reviewed and case discussed with Care Management/Social Workerr. Management plans discussed with the patient, family and they are in agreement.  CODE STATUS: DNR  TOTAL TIME TAKING CARE OF THIS PATIENT: 35 minutes.     POSSIBLE D/C IN 1-2 DAYS, DEPENDING ON CLINICAL CONDITION.   Vaughan Basta M.D on 05/05/2017   Between 7am to 6pm - Pager - [phone]  After 6pm go to [email or web address] - password [email or web address] [facility]  Office  [phone]  CC: Primary care physician; Center, [facility]  Note: This dictation was prepared with Dragon dictation along with smaller phrase technology. Any transcriptional errors that result from this process are unintentional.

## 2017-05-05 NOTE — Consult Note (Signed)
[facility] CONSULT NOTE    Date: 05/05/2017                  Patient Name:  Angela Pham  MRN: [id number]  DOB: 10/17/1940  Age / Sex: 76 y.o., female         PCP: [facility], [facility]                 Service Requesting Consult: Dr. Mortimer Fries                 Reason for Consult: Acute renal failure/CKD stage III            History of Present Illness: Patient is a 76 y.o. female with a PMHx of Asthma, congestive heart failure, hypertension, hyperlipidemia, morbid obesity, diabetes mellitus type 2, chronic kidney disease stage III, who was admitted to [facility] on 05/04/2017 for evaluation of shortness of breath. She has known underlying asthma. She states that over the past several days she's had increasing shortness of breath as well as chest pain. She was subsequently admitted to the intensive care unit. She was initially placed on BiPAP. She is improving at the moment. We are asked to see the patient for evaluation management of chronic kidney disease stage III and acute renal failure.  The patient's baseline creatinine appears to be 1.0 on 03/10/2017. Her EGFR most recently was 33. She now appears to have an episode of acute renal failure. Her creatinine is 1.38 with a BUN of 29 and EGFR of 37.   Medications: Outpatient medications: Prescriptions Prior to Admission  Medication Sig Dispense Refill Last Dose  . aspirin EC 81 MG EC tablet Take 1 tablet (81 mg total) by mouth daily.   05/04/2017 at 0800  . atorvastatin (LIPITOR) 40 MG tablet Take 40 mg by mouth daily.   05/03/2017 at 2000  . furosemide (LASIX) 40 MG tablet Take 1 tablet (40 mg total) by mouth daily. 30 tablet 0 05/04/2017 at 0800  . hydrALAZINE (APRESOLINE) 50 MG tablet Take 1 tablet (50 mg total) by mouth every 8 (eight) hours. (Patient taking differently: Take 50 mg by mouth daily. ) 90 tablet 0 05/04/2017 at 0800  . INCRUSE ELLIPTA 62.5 MCG/INH AEPB Inhale 1 puff into the lungs daily.   05/04/2017 at  0800  . Ipratropium-Albuterol (COMBIVENT IN) Inhale 3 puffs into the lungs every 4 (four) hours as needed.   PRN at PRN  . ipratropium-albuterol (DUONEB) 0.5-2.5 (3) MG/3ML SOLN Inhale 3 mLs into the lungs every 4 (four) hours as needed.   05/04/2017 at 0800  . lactulose (CHRONULAC) 10 GM/15ML solution Take 30 g by mouth daily.   05/03/2017 at 0800  . levocetirizine (XYZAL) 5 MG tablet Take 10 mg by mouth daily.   05/04/2017 at 0800  . metFORMIN (GLUCOPHAGE) 850 MG tablet Take 1 tablet by mouth 2 (two) times daily.   05/04/2017 at 0800  . mirtazapine (REMERON) 45 MG tablet Take 1 tablet by mouth at bedtime.   05/03/2017 at 2000  . montelukast (SINGULAIR) 10 MG tablet Take 1 tablet (10 mg total) by mouth at bedtime. 30 tablet 0 05/03/2017 at 2000  . Multiple Vitamins-Minerals (EYE VITAMINS PO) Take 1 tablet by mouth daily.   05/04/2017 at 0800  . predniSONE (DELTASONE) 50 MG tablet Take 1 tablet for 4 days then stop (Patient not taking: Reported on 05/04/2017) 4 tablet 4 Completed Course at Unknown time  . tiotropium (SPIRIVA) 18 MCG inhalation capsule Place  1 capsule (18 mcg total) into inhaler and inhale daily. (Patient not taking: Reported on 05/04/2017) 30 capsule 12 Not Taking at Unknown time  . verapamil (CALAN-SR) 180 MG CR tablet Take 2 tablets (360 mg total) by mouth daily. 60 tablet 0     Current medications: Current Facility-Administered Medications  Medication Dose Route Frequency Provider Last Rate Last Dose  . acetaminophen (TYLENOL) tablet 650 mg  650 mg Oral Q6H PRN Loletha Grayer, MD       Or  . acetaminophen (TYLENOL) suppository 650 mg  650 mg Rectal Q6H PRN Loletha Grayer, MD      . aspirin EC tablet 81 mg  81 mg Oral Daily Loletha Grayer, MD   81 mg at 05/05/17 0929  . atorvastatin (LIPITOR) tablet 40 mg  40 mg Oral q1800 Loletha Grayer, MD   40 mg at 05/04/17 2210  . budesonide (PULMICORT) nebulizer solution 0.5 mg  0.5 mg Nebulization BID Loletha Grayer, MD   0.5 mg at 05/05/17  0731  . enoxaparin (LOVENOX) injection 40 mg  40 mg Subcutaneous Q24H Vaughan Basta, MD      . furosemide (LASIX) injection 40 mg  40 mg Intravenous Daily Loletha Grayer, MD   40 mg at 05/05/17 0929  . hydrALAZINE (APRESOLINE) tablet 50 mg  50 mg Oral Q8H Loletha Grayer, MD   50 mg at 05/05/17 5726  . insulin aspart (novoLOG) injection 0-5 Units  0-5 Units Subcutaneous QHS Wieting, Richard, MD      . insulin aspart (novoLOG) injection 0-9 Units  0-9 Units Subcutaneous TID WC Loletha Grayer, MD   2 Units at 05/05/17 0755  . ipratropium-albuterol (DUONEB) 0.5-2.5 (3) MG/3ML nebulizer solution 3 mL  3 mL Nebulization Q4H Kasa, Kurian, MD      . lactulose (CHRONULAC) 10 GM/15ML solution 30 g  30 g Oral Daily Loletha Grayer, MD   Stopped at 05/05/17 [phone]  . loratadine (CLARITIN) tablet 10 mg  10 mg Oral Daily Loletha Grayer, MD   10 mg at 05/05/17 0929  . MEDLINE mouth rinse  15 mL Mouth Rinse BID Varughese, Bincy S, NP   15 mL at 05/04/17 2230  . methylPREDNISolone sodium succinate (SOLU-MEDROL) 40 mg/mL injection 20 mg  20 mg Intravenous Q24H Flora Lipps, MD      . mirtazapine (REMERON) tablet 45 mg  45 mg Oral QHS Loletha Grayer, MD   45 mg at 05/04/17 2211  . montelukast (SINGULAIR) tablet 10 mg  10 mg Oral QHS Loletha Grayer, MD   10 mg at 05/04/17 2213  . multivitamin-lutein (OCUVITE-LUTEIN) capsule 1 capsule  1 capsule Oral Daily Loletha Grayer, MD   1 capsule at 05/05/17 0929  . verapamil (CALAN-SR) CR tablet 180 mg  180 mg Oral Daily Loletha Grayer, MD   180 mg at 05/05/17 5974      Allergies: Allergies  Allergen Reactions  . Ace Inhibitors Anaphylaxis    Tongue swelling  . Shellfish Allergy Hives and Swelling    Swelling around face and mouth       Past Medical History: Past Medical History:  Diagnosis Date  . Asthma   . CHF (congestive heart failure) ([place])   . Hyperlipidemia   . Hypertension   . Morbid obesity ([place])   . Type II diabetes mellitus  ([place])      Past Surgical History: Past Surgical History:  Procedure Laterality Date  . c-section    . RIGHT/LEFT HEART CATH AND CORONARY ANGIOGRAPHY N/A 03/09/2017   Procedure:  Right/Left Heart Cath and Coronary Angiography;  Surgeon: Minna Merritts, MD;  Location: [place] [facility];  Service: Cardiovascular;  Laterality: N/A;     Family History: Family History  Problem Relation Age of Onset  . CAD Father   . Colon cancer Sister   . Leukemia Brother   . Throat cancer Brother   . Cervical cancer Sister      Social History: Social History   Social History  . Marital status: Single    Spouse name: N/A  . Number of children: N/A  . Years of education: N/A   Occupational History  . Not on file.   Social History Main Topics  . Smoking status: Never Smoker  . Smokeless tobacco: Never Used  . Alcohol use No  . Drug use: No  . Sexual activity: No   Other Topics Concern  . Not on file   Social History Narrative  . No narrative on file     Review of Systems: Review of Systems  Constitutional: Positive for malaise/fatigue. Negative for fever and weight loss.  HENT: Negative for ear pain, hearing loss and tinnitus.   Eyes: Negative for blurred vision and double vision.  Respiratory: Positive for cough, sputum production and shortness of breath.   Cardiovascular: Positive for chest pain and orthopnea. Negative for leg swelling.  Gastrointestinal: Negative for heartburn, nausea and vomiting.  Genitourinary: Negative for dysuria, frequency and urgency.  Musculoskeletal: Positive for joint pain. Negative for myalgias.  Skin: Negative for itching and rash.  Neurological: Negative for dizziness and focal weakness.  Endo/Heme/Allergies: Negative for polydipsia. Does not bruise/bleed easily.  Psychiatric/Behavioral: Negative for depression. The patient is not nervous/anxious.      Vital Signs: Blood pressure 121/60, pulse (!) 111, temperature 98.1 F (36.7 C),  temperature source Oral, resp. rate 16, height 4' 9"  (1.448 m), weight 85.7 kg (188 lb 15 oz), SpO2 95 %.  Weight trends: Filed Weights   05/04/17 1811  Weight: 85.7 kg (188 lb 15 oz)    Physical Exam: General: NAD, laying in bed  Head: Normocephalic, atraumatic.  Eyes: Anicteric, EOMI  Nose: Mucous membranes moist, not inflammed, nonerythematous.  Throat: Oropharynx nonerythematous, no exudate appreciated.   Neck: Supple, trachea midline.  Lungs:  Scattered rhonchi, normal effort  Heart: RRR. S1S2 no rubs  Abdomen:  BS normoactive. Soft, Nondistended, non-tender.  No masses or organomegaly.  Extremities: No pretibial edema.  Neurologic: A&O X3, Motor strength is 5/5 in the all 4 extremities  Skin: No visible rashes, scars.    Lab results: Basic Metabolic Panel:  Recent Labs Lab 05/04/17 1454 05/05/17 0026  NA 146* 143  K 4.2 3.9  CL 110 108  CO2 23 25  GLUCOSE 202* 217*  BUN 29* 29*  CREATININE 1.40* 1.38*  CALCIUM 9.1 8.8*    Liver Function Tests:  Recent Labs Lab 05/04/17 1454  AST 33  ALT 21  ALKPHOS 61  BILITOT 0.5  PROT 7.1  ALBUMIN 3.4*   No results for input(s): LIPASE, AMYLASE in the last 168 hours. No results for input(s): AMMONIA in the last 168 hours.  CBC:  Recent Labs Lab 05/04/17 1454 05/05/17 0026  WBC 14.0* 10.8  NEUTROABS 8.4*  --   HGB 11.2* 10.6*  HCT 34.9* 31.6*  MCV 90.5 89.1  PLT 342 271    Cardiac Enzymes:  Recent Labs Lab 05/04/17 1454 05/04/17 1842 05/05/17 0026 05/05/17 0608  TROPONINI 0.10* 0.19* 0.21* 0.20*    BNP: Invalid input(s):  POCBNP  CBG:  Recent Labs Lab 05/04/17 2118 05/05/17 0720  GLUCAP 148* 152*    Microbiology: Results for orders placed or performed during the hospital encounter of 05/04/17  Culture, blood (Routine X 2) w Reflex to ID Panel     Status: None (Preliminary result)   Collection Time: 05/04/17  2:54 PM  Result Value Ref Range Status   Specimen Description BLOOD RIGHT  ANTECUBITAL  Final   Special Requests   Final    BOTTLES DRAWN AEROBIC AND ANAEROBIC Blood Culture adequate volume   Culture NO GROWTH < 12 HOURS  Final   Report Status PENDING  Incomplete  Culture, blood (Routine X 2) w Reflex to ID Panel     Status: None (Preliminary result)   Collection Time: 05/04/17  2:54 PM  Result Value Ref Range Status   Specimen Description BLOOD LEFT ANTECUBITAL  Final   Special Requests   Final    BOTTLES DRAWN AEROBIC AND ANAEROBIC Blood Culture adequate volume   Culture NO GROWTH < 12 HOURS  Final   Report Status PENDING  Incomplete  MRSA PCR Screening     Status: None   Collection Time: 05/04/17  6:14 PM  Result Value Ref Range Status   MRSA by PCR NEGATIVE NEGATIVE Final    Comment:        The GeneXpert MRSA Assay (FDA approved for NASAL specimens only), is one component of a comprehensive MRSA colonization surveillance program. It is not intended to diagnose MRSA infection nor to guide or monitor treatment for MRSA infections.   Culture, expectorated sputum-assessment     Status: None   Collection Time: 05/04/17  6:59 PM  Result Value Ref Range Status   Specimen Description EXPECTORATED SPUTUM  Final   Special Requests NONE  Final   Sputum evaluation   Final    Sputum specimen not acceptable for testing.  Please recollect.     Report Status 05/04/2017 FINAL  Final    Coagulation Studies:  Recent Labs  05/04/17 1454  LABPROT 13.3  INR 1.01    Urinalysis: No results for input(s): COLORURINE, LABSPEC, PHURINE, GLUCOSEU, HGBUR, BILIRUBINUR, KETONESUR, PROTEINUR, UROBILINOGEN, NITRITE, LEUKOCYTESUR in the last 72 hours.  Invalid input(s): APPERANCEUR    Imaging: Dg Chest Port 1 View  Result Date: 05/05/2017 CLINICAL DATA:  Acute respiratory failure. EXAM: PORTABLE CHEST 1 VIEW COMPARISON:  Chest radiograph May 04, 2017 FINDINGS: Cardiac silhouette is similarly enlarged. Mediastinal silhouette is nonsuspicious. Pulmonary vascular  congestion and mild interstitial prominence. Slight blunting of the costophrenic angles. No pneumothorax. Faint calcification in the neck are likely vascular. Mildly widened RIGHT acromioclavicular joint space. Soft tissue planes are nonsuspicious. IMPRESSION: Mild cardiomegaly and interstitial prominence compatible with pulmonary edema. Trace suspected pleural effusions. Electronically Signed   By: Elon Alas M.D.   On: 05/05/2017 05:38   Dg Chest Port 1 View  Result Date: 05/04/2017 CLINICAL DATA:  severe shortness of breath. EXAM: PORTABLE CHEST 1 VIEW COMPARISON:  03/07/17 FINDINGS: There is moderate cardiac enlargement. There is mild increase interstitial markings compatible with edema. Hazy infiltrate is noted within the right lung base. IMPRESSION: 1. Right lung base opacity.  Cannot rule out pneumonia. 2. Cardiac enlargement and mild pulmonary edema. Electronically Signed   By: Kerby Moors M.D.   On: 05/04/2017 17:45      Assessment & Plan: Pt is a 76 y.o. female with a PMHx of Asthma, congestive heart failure EF 45-50%, hypertension, hyperlipidemia, morbid obesity, diabetes mellitus type 2,  chronic kidney disease stage III, who was admitted to [facility] on 05/04/2017 for evaluation of shortness of breath.  1. Acute renal failure/chronic kidney disease stage III Baseline creatinine 1.0 with an EGFR 55. Recently the patient has been taking Advil for bilateral knee pain. She also has risk factors for chronic kidney disease including hypertension and diabetes mellitus type 2. We will proceed with a with additional workup including renal ultrasound, SPEP, UPEP, ANA, and urine protein to creatinine ratio.  Okay to continue Lasix for now. If this causes worsening of renal function we would recommend discontinuation.  We have advised the patient to avoid NSAIDs going forward, she verbalized understanding of this.   2. Acute respiratory failure. Patient came in with worsening shortness of breath in  the setting of asthma. She was initially on BiPAP and has been taken off for the moment. Otherwise management as per pulmonary/critical care.  3.  Anemia of chronic kidney disease. Hemoglobin currently 10.6. Check SPEP and UPEP as above. Also check serum iron studies.

## 2017-05-06 ENCOUNTER — Inpatient Hospital Stay: Payer: Medicare Other

## 2017-05-06 LAB — GLUCOSE, CAPILLARY
GLUCOSE-CAPILLARY: 169 mg/dL — AB (ref 65–99)
GLUCOSE-CAPILLARY: 190 mg/dL — AB (ref 65–99)
GLUCOSE-CAPILLARY: 157 mg/dL — AB (ref 65–99)
Glucose-Capillary: 147 mg/dL — ABNORMAL HIGH (ref 65–99)

## 2017-05-06 LAB — PROTEIN ELECTROPHORESIS, SERUM
A/G Ratio: 0.9 (ref 0.7–1.7)
ALBUMIN ELP: 3.1 g/dL (ref 2.9–4.4)
ALPHA-1-GLOBULIN: 0.2 g/dL (ref 0.0–0.4)
Alpha-2-Globulin: 1.1 g/dL — ABNORMAL HIGH (ref 0.4–1.0)
Beta Globulin: 1.1 g/dL (ref 0.7–1.3)
GLOBULIN, TOTAL: 3.3 g/dL (ref 2.2–3.9)
Gamma Globulin: 0.8 g/dL (ref 0.4–1.8)
M-SPIKE, %: 0.2 g/dL — AB
TOTAL PROTEIN ELP: 6.4 g/dL (ref 6.0–8.5)

## 2017-05-06 LAB — C4 COMPLEMENT: Complement C4, Body Fluid: 30 mg/dL (ref 14–44)

## 2017-05-06 LAB — C3 COMPLEMENT: C3 Complement: 162 mg/dL (ref 82–167)

## 2017-05-06 LAB — PROCALCITONIN: PROCALCITONIN: 0.67 ng/mL

## 2017-05-06 LAB — ANA W/REFLEX IF POSITIVE: Anti Nuclear Antibody(ANA): NEGATIVE

## 2017-05-06 MED ORDER — FERROUS SULFATE 325 (65 FE) MG PO TABS
325.0000 mg | ORAL_TABLET | Freq: Two times a day (BID) | ORAL | Status: DC
  Administered 2017-05-06 – 2017-05-08 (×4): 325 mg via ORAL
  Filled 2017-05-06 (×4): qty 1

## 2017-05-06 MED ORDER — AZITHROMYCIN 500 MG PO TABS
500.0000 mg | ORAL_TABLET | Freq: Every day | ORAL | Status: AC
  Administered 2017-05-06: 500 mg via ORAL
  Filled 2017-05-06: qty 1

## 2017-05-06 MED ORDER — FUROSEMIDE 10 MG/ML IJ SOLN
60.0000 mg | Freq: Once | INTRAMUSCULAR | Status: AC
  Administered 2017-05-06: 60 mg via INTRAVENOUS
  Filled 2017-05-06: qty 6

## 2017-05-06 MED ORDER — AZITHROMYCIN 250 MG PO TABS
250.0000 mg | ORAL_TABLET | Freq: Every day | ORAL | Status: DC
  Administered 2017-05-07 – 2017-05-08 (×2): 250 mg via ORAL
  Filled 2017-05-06 (×2): qty 1

## 2017-05-06 MED ORDER — METHYLPREDNISOLONE SODIUM SUCC 40 MG IJ SOLR
40.0000 mg | Freq: Two times a day (BID) | INTRAMUSCULAR | Status: DC
  Administered 2017-05-06 – 2017-05-08 (×4): 40 mg via INTRAVENOUS
  Filled 2017-05-06 (×4): qty 1

## 2017-05-06 NOTE — Progress Notes (Signed)
[facility]  ROUNDING NOTE   Subjective:  Patient currently on BiPAP this a.m. Urine output was 1.9 L over the preceding 24 hours. No new creatinine today.   Objective:  Vital signs in last 24 hours:  Temp:  [97.6 F (36.4 C)-98.4 F (36.9 C)] 98.1 F (36.7 C) (08/10 0800) Pulse Rate:  [79-130] 101 (08/10 0900) Resp:  [14-36] 23 (08/10 0900) BP: (94-143)/(49-95) 94/60 (08/10 0900) SpO2:  [81 %-100 %] 100 % (08/10 0900) FiO2 (%):  [70 %-100 %] 70 % (08/10 0800)  Weight change:  Filed Weights   05/04/17 1811  Weight: 85.7 kg (188 lb 15 oz)    Intake/Output: I/O last 3 completed shifts: In: 240 [P.O.:240] Out: 2485 [Urine:2485]   Intake/Output this shift:  Total I/O In: 0  Out: 725 [Urine:725]  Physical Exam: General: No acute distress  Head: Normocephalic, atraumatic. Moist oral mucosal membranes  Eyes: Anicteric  Neck: Supple, trachea midline  Lungs:  Scattered rhonchi and wheezing, on BiPAP   Heart: S1S2 no rubs  Abdomen:  Soft, nontender, bowel sounds present  Extremities: Trace peripheral edema.  Neurologic: Awake, alert, following commands  Skin: No lesions       Basic Metabolic Panel:  Recent Labs Lab 05/04/17 1454 05/05/17 0026  NA 146* 143  K 4.2 3.9  CL 110 108  CO2 23 25  GLUCOSE 202* 217*  BUN 29* 29*  CREATININE 1.40* 1.38*  CALCIUM 9.1 8.8*    Liver Function Tests:  Recent Labs Lab 05/04/17 1454  AST 33  ALT 21  ALKPHOS 61  BILITOT 0.5  PROT 7.1  ALBUMIN 3.4*   No results for input(s): LIPASE, AMYLASE in the last 168 hours. No results for input(s): AMMONIA in the last 168 hours.  CBC:  Recent Labs Lab 05/04/17 1454 05/05/17 0026  WBC 14.0* 10.8  NEUTROABS 8.4*  --   HGB 11.2* 10.6*  HCT 34.9* 31.6*  MCV 90.5 89.1  PLT 342 271    Cardiac Enzymes:  Recent Labs Lab 05/04/17 1454 05/04/17 1842 05/05/17 0026 05/05/17 0608  TROPONINI 0.10* 0.19* 0.21* 0.20*    BNP: Invalid input(s):  POCBNP  CBG:  Recent Labs Lab 05/05/17 0720 05/05/17 1205 05/05/17 1644 05/05/17 2144 05/06/17 0740  GLUCAP 152* 153* 114* 186* 169*    Microbiology: Results for orders placed or performed during the hospital encounter of 05/04/17  Culture, blood (Routine X 2) w Reflex to ID Panel     Status: None (Preliminary result)   Collection Time: 05/04/17  2:54 PM  Result Value Ref Range Status   Specimen Description BLOOD RIGHT ANTECUBITAL  Final   Special Requests   Final    BOTTLES DRAWN AEROBIC AND ANAEROBIC Blood Culture adequate volume   Culture NO GROWTH 2 DAYS  Final   Report Status PENDING  Incomplete  Culture, blood (Routine X 2) w Reflex to ID Panel     Status: None (Preliminary result)   Collection Time: 05/04/17  2:54 PM  Result Value Ref Range Status   Specimen Description BLOOD LEFT ANTECUBITAL  Final   Special Requests   Final    BOTTLES DRAWN AEROBIC AND ANAEROBIC Blood Culture adequate volume   Culture NO GROWTH 2 DAYS  Final   Report Status PENDING  Incomplete  MRSA PCR Screening     Status: None   Collection Time: 05/04/17  6:14 PM  Result Value Ref Range Status   MRSA by PCR NEGATIVE NEGATIVE Final    Comment:  The GeneXpert MRSA Assay (FDA approved for NASAL specimens only), is one component of a comprehensive MRSA colonization surveillance program. It is not intended to diagnose MRSA infection nor to guide or monitor treatment for MRSA infections.   Culture, expectorated sputum-assessment     Status: None   Collection Time: 05/04/17  6:59 PM  Result Value Ref Range Status   Specimen Description EXPECTORATED SPUTUM  Final   Special Requests NONE  Final   Sputum evaluation   Final    Sputum specimen not acceptable for testing.  Please recollect.     Report Status 05/04/2017 FINAL  Final    Coagulation Studies:  Recent Labs  05/04/17 1454  LABPROT 13.3  INR 1.01    Urinalysis: No results for input(s): COLORURINE, LABSPEC, PHURINE,  GLUCOSEU, HGBUR, BILIRUBINUR, KETONESUR, PROTEINUR, UROBILINOGEN, NITRITE, LEUKOCYTESUR in the last 72 hours.  Invalid input(s): APPERANCEUR    Imaging: Dg Chest 1 View  Result Date: 05/06/2017 CLINICAL DATA:  Shortness of breath.  History of CHF EXAM: CHEST 1 VIEW COMPARISON:  05/05/2017 FINDINGS: There is moderate cardiac enlargement. Aortic atherosclerosis. Mild pulmonary edema identified. No airspace opacities IMPRESSION: 1. Cardiac enlargement and mild pulmonary edema compatible with CHF. 2.  Aortic Atherosclerosis (ICD10-I70.0). Electronically Signed   By: Kerby Moors M.D.   On: 05/06/2017 09:32   US Renal  Result Date: 05/05/2017 CLINICAL DATA:  Acute renal failure, history hypertension, type II diabetes mellitus, CHF, hyperlipidemia EXAM: RENAL / URINARY TRACT ULTRASOUND COMPLETE COMPARISON:  None FINDINGS: Right Kidney: Length: 9.6 cm. Normal cortical thickness. Significantly increased cortical echogenicity. No mass, hydronephrosis or shadowing calcification. Left Kidney: Length: 9.9 cm. Normal cortical thickness. Diffusely increased cortical echogenicity. No hydronephrosis or shadowing calcification. Small cysts at the lateral mid LEFT kidney 9 x 10 x 8 mm and at the inferior pole 12 x 12 x 9 mm, both containing scattered low level internal echoes, appearing minimally complicated. Bladder: Appear distended with calculated volume of 980 mL. Patient was unable to void. No bladder wall thickening or obvious mass. IMPRESSION: Medical renal disease changes of both kidneys. Small mildly complicated appearing cysts in the LEFT kidney. Distended urinary bladder. Electronically Signed   By: Lavonia Dana M.D.   On: 05/05/2017 11:55   Dg Chest Port 1 View  Result Date: 05/05/2017 CLINICAL DATA:  Acute respiratory failure. EXAM: PORTABLE CHEST 1 VIEW COMPARISON:  Chest radiograph May 04, 2017 FINDINGS: Cardiac silhouette is similarly enlarged. Mediastinal silhouette is nonsuspicious. Pulmonary  vascular congestion and mild interstitial prominence. Slight blunting of the costophrenic angles. No pneumothorax. Faint calcification in the neck are likely vascular. Mildly widened RIGHT acromioclavicular joint space. Soft tissue planes are nonsuspicious. IMPRESSION: Mild cardiomegaly and interstitial prominence compatible with pulmonary edema. Trace suspected pleural effusions. Electronically Signed   By: Elon Alas M.D.   On: 05/05/2017 05:38   Dg Chest Port 1 View  Result Date: 05/04/2017 CLINICAL DATA:  severe shortness of breath. EXAM: PORTABLE CHEST 1 VIEW COMPARISON:  03/07/17 FINDINGS: There is moderate cardiac enlargement. There is mild increase interstitial markings compatible with edema. Hazy infiltrate is noted within the right lung base. IMPRESSION: 1. Right lung base opacity.  Cannot rule out pneumonia. 2. Cardiac enlargement and mild pulmonary edema. Electronically Signed   By: Kerby Moors M.D.   On: 05/04/2017 17:45     Medications:    . aspirin EC  81 mg Oral Daily  . atorvastatin  40 mg Oral q1800  . budesonide (PULMICORT) nebulizer solution  0.5  mg Nebulization BID  . enoxaparin (LOVENOX) injection  40 mg Subcutaneous Q24H  . furosemide  40 mg Intravenous Daily  . hydrALAZINE  50 mg Oral Q8H  . insulin aspart  0-5 Units Subcutaneous QHS  . insulin aspart  0-9 Units Subcutaneous TID WC  . ipratropium-albuterol  3 mL Nebulization Q4H  . lactulose  30 g Oral Daily  . loratadine  10 mg Oral Daily  . mouth rinse  15 mL Mouth Rinse BID  . methylPREDNISolone (SOLU-MEDROL) injection  125 mg Intravenous Once  . methylPREDNISolone (SOLU-MEDROL) injection  60 mg Intravenous Q6H  . mirtazapine  45 mg Oral QHS  . montelukast  10 mg Oral QHS  . multivitamin-lutein  1 capsule Oral Daily  . verapamil  180 mg Oral Daily   acetaminophen **OR** acetaminophen, ALPRAZolam, guaiFENesin, metoprolol tartrate  Assessment/ Plan:  76 y.o. female with a PMHx of Asthma, congestive  heart failure EF 45-50%, hypertension, hyperlipidemia, morbid obesity, diabetes mellitus type 2, chronic kidney disease stage III, who was admitted to [facility] on 05/04/2017 for evaluation of shortness of breath.  1. Acute renal failure/chronic kidney disease stage III Baseline creatinine 1.0 with an EGFR 55. Recently the patient has been taking Advil for bilateral knee pain. She also has risk factors for chronic kidney disease including hypertension and diabetes mellitus type 2. -  no new creatinine today. Good urine output noted yesterday. Continue to periodically monitor renal function.Renal ultrasound shows increased echogenicity bilateral.  2. Acute respiratory failure.  Having shortness of breath at the moment. Currently on BiPAP. Continue to monitor respiratory status.  3.  Anemia of chronic kidney disease:  Iron saturation low at 10%. Start the patient on ferrous sulfate 325 mg by mouth twice a day. Continue to monitor CBC periodically.   LOS: 2 Trevonn Hallum 8/10/201810:15 AM

## 2017-05-06 NOTE — Progress Notes (Signed)
At change of shift at 1915. Pt became very anxious and starting yelling  "I can't breath". Pts SpO2 in 90s% and RR stable. HR in 120s. PT became increasingly agitated even with calming techniques. WOB increased,pursed lip breathing, SpO2 deacrsing to 80s% then 70s on 5L [place], HR 140s 150. Respiratory called for BiPAP to be placed back in room and on pt. NP also called to bedside. Verbal orders to give, 5mg  Metoprolol, 1mg  Ativan, and 125mg  of Solu-medrol. Administered. Pt. Placed back on bipap. Readjusted in bed. Pt now resting comfortably. Will continue to monitor.

## 2017-05-06 NOTE — Progress Notes (Signed)
Name: Angela Pham MRN: [id number] DOB: 1940/10/30    ADMISSION DATE:  05/04/2017 CONSULTATION DATE: 05/04/2017  REFERRING MD : Dr. Leslye Peer   CHIEF COMPLAINT: Shortness of Breath   BRIEF PATIENT DESCRIPTION:  76 year old female admitted in 08/8 with acute on chronic respiratory failure secondary to CHF vs. asthma exacerbation and questionable pneumonia requiring continuous BiPAP.  SIGNIFICANT EVENTS  08/8-Patient admitted to the stepdown unit  STUDIES:  None   HISTORY OF PRESENT ILLNESS:   This is a 76 year old female with a past medical history of type 2 diabetes mellitus, morbid obesity, hypertension, hyperlipidemia, combined CHF, and asthma.  She presented to [facility] ER 08/8 with complaints of shortness of breath, wheezing, productive cough, coughing up thick green phlegm, and chills onset of symptoms several days prior to presentation. Due to symptoms she notified EMS upon their arrival she received 125 mg iv solumedrol and 2 duoneb treatments.  In the ER due to persistent shortness of breath patient placed on continuous Bipap chest x-ray revealing pulmonary edema and possible pneumonia, initial troponin slightly elevated at 0.10, and lactic acid 3.7.  She was subsequently admitted to the stepdown unit by hospitalist team for further workup and treatment PCCM consulted.  Prior to Admission medications   Medication Sig Start Date End Date Taking? Authorizing Provider  aspirin EC 81 MG EC tablet Take 1 tablet (81 mg total) by mouth daily. 03/12/17  Yes Mody, Ulice Bold, MD  atorvastatin (LIPITOR) 40 MG tablet Take 40 mg by mouth daily.   Yes [provider]  furosemide (LASIX) 40 MG tablet Take 1 tablet (40 mg total) by mouth daily. 03/13/17  Yes Mody, Ulice Bold, MD  hydrALAZINE (APRESOLINE) 50 MG tablet Take 1 tablet (50 mg total) by mouth every 8 (eight) hours. Patient taking differently: Take 50 mg by mouth daily.  03/11/17  Yes Mody, Sital, MD  INCRUSE ELLIPTA 62.5 MCG/INH AEPB Inhale 1  puff into the lungs daily. 03/16/17  Yes [provider]  Ipratropium-Albuterol (COMBIVENT IN) Inhale 3 puffs into the lungs every 4 (four) hours as needed.   Yes [provider]  ipratropium-albuterol (DUONEB) 0.5-2.5 (3) MG/3ML SOLN Inhale 3 mLs into the lungs every 4 (four) hours as needed.   Yes [provider]  lactulose (CHRONULAC) 10 GM/15ML solution Take 30 g by mouth daily.   Yes [provider]  levocetirizine (XYZAL) 5 MG tablet Take 10 mg by mouth daily. 03/18/17  Yes [provider]  metFORMIN (GLUCOPHAGE) 850 MG tablet Take 1 tablet by mouth 2 (two) times daily.   Yes [provider]  mirtazapine (REMERON) 45 MG tablet Take 1 tablet by mouth at bedtime.   Yes [provider]  montelukast (SINGULAIR) 10 MG tablet Take 1 tablet (10 mg total) by mouth at bedtime. 03/11/17  Yes Mody, Ulice Bold, MD  Multiple Vitamins-Minerals (EYE VITAMINS PO) Take 1 tablet by mouth daily.   Yes [provider]  predniSONE (DELTASONE) 50 MG tablet Take 1 tablet for 4 days then stop Patient not taking: Reported on 05/04/2017 03/12/17   [name], MD  tiotropium (SPIRIVA) 18 MCG inhalation capsule Place 1 capsule (18 mcg total) into inhaler and inhale daily. Patient not taking: Reported on 05/04/2017 03/12/17   [name], MD  verapamil (CALAN-SR) 180 MG CR tablet Take 2 tablets (360 mg total) by mouth daily. 03/12/17 04/10/17  [name], MD   Allergies  Allergen Reactions  . Ace Inhibitors Anaphylaxis    Tongue swelling  . Shellfish Allergy Hives  and Swelling    Swelling around face and mouth    SUBJECTIVE:  8/10:  Patient was in severe respiratory distress last night on 8/9.  Her sats were down to 60%. Patient was treated with duonebX1, Methylprednisone 125 mg X1,and she was placed on Bipap.   VITAL SIGNS: Temp:  [97.6 F (36.4 C)-98.4 F (36.9 C)] 97.6 F (36.4 C) (08/10 0200) Pulse Rate:  [79-130] 93 (08/10 0400) Resp:  [14-36]  19 (08/10 0400) BP: (108-136)/(49-84) 127/68 (08/10 0400) SpO2:  [81 %-100 %] 96 % (08/10 0400) FiO2 (%):  [70 %-100 %] 70 % (08/09 2353)  PHYSICAL EXAMINATION: General: Well-developed, well-nourished, obese Caucasian female, NAD Neuro: Alert and oriented, follows commands HEENT: Supple, no JVD Cardiovascular: Sinus tach with bundle branch block, no M/R/G Lungs: Diminished throughout, even, labored;  wheezes,  No rales, rhonchi, or crackles on BiPAP Abdomen: Positive bowel sounds 4, soft, obese, nontender, nondistended Musculoskeletal: Normal bulk and tone, no edema Skin: Intact no rashes or lesions   Recent Labs Lab 05/04/17 1454 05/05/17 0026  NA 146* 143  K 4.2 3.9  CL 110 108  CO2 23 25  BUN 29* 29*  CREATININE 1.40* 1.38*  GLUCOSE 202* 217*    Recent Labs Lab 05/04/17 1454 05/05/17 0026  HGB 11.2* 10.6*  HCT 34.9* 31.6*  WBC 14.0* 10.8  PLT 342 271   US Renal  Result Date: 05/05/2017 CLINICAL DATA:  Acute renal failure, history hypertension, type II diabetes mellitus, CHF, hyperlipidemia EXAM: RENAL / URINARY TRACT ULTRASOUND COMPLETE COMPARISON:  None FINDINGS: Right Kidney: Length: 9.6 cm. Normal cortical thickness. Significantly increased cortical echogenicity. No mass, hydronephrosis or shadowing calcification. Left Kidney: Length: 9.9 cm. Normal cortical thickness. Diffusely increased cortical echogenicity. No hydronephrosis or shadowing calcification. Small cysts at the lateral mid LEFT kidney 9 x 10 x 8 mm and at the inferior pole 12 x 12 x 9 mm, both containing scattered low level internal echoes, appearing minimally complicated. Bladder: Appear distended with calculated volume of 980 mL. Patient was unable to void. No bladder wall thickening or obvious mass. IMPRESSION: Medical renal disease changes of both kidneys. Small mildly complicated appearing cysts in the LEFT kidney. Distended urinary bladder. Electronically Signed   By: Lavonia Dana M.D.   On: 05/05/2017  11:55   Dg Chest Port 1 View  Result Date: 05/05/2017 CLINICAL DATA:  Acute respiratory failure. EXAM: PORTABLE CHEST 1 VIEW COMPARISON:  Chest radiograph May 04, 2017 FINDINGS: Cardiac silhouette is similarly enlarged. Mediastinal silhouette is nonsuspicious. Pulmonary vascular congestion and mild interstitial prominence. Slight blunting of the costophrenic angles. No pneumothorax. Faint calcification in the neck are likely vascular. Mildly widened RIGHT acromioclavicular joint space. Soft tissue planes are nonsuspicious. IMPRESSION: Mild cardiomegaly and interstitial prominence compatible with pulmonary edema. Trace suspected pleural effusions. Electronically Signed   By: Elon Alas M.D.   On: 05/05/2017 05:38   Dg Chest Port 1 View  Result Date: 05/04/2017 CLINICAL DATA:  severe shortness of breath. EXAM: PORTABLE CHEST 1 VIEW COMPARISON:  03/07/17 FINDINGS: There is moderate cardiac enlargement. There is mild increase interstitial markings compatible with edema. Hazy infiltrate is noted within the right lung base. IMPRESSION: 1. Right lung base opacity.  Cannot rule out pneumonia. 2. Cardiac enlargement and mild pulmonary edema. Electronically Signed   By: Kerby Moors M.D.   On: 05/04/2017 17:45    ASSESSMENT / PLAN: Acute on chronic respiratory failure secondary to CHF vs. asthma exacerbation and questionable pneumonia Elevated troponins secondary to  demand ischemia versus NSTEMI Hx: Type II Diabetes Mellitus and HTN  Severe Anxiety Tachycardia Hx of Hypertension/Hyperlipidemia  P:  Continue Bipap wean as tolerated Increased Steroids, taper Continue Bronchodilators Added PRN metoprolol to keep HR<115 Continue aspirin Continue Atorvastatin Continue Verapamil/hydralazine Added Robitussin Continue Singulair Continue Lasix   Bincy Varughese,AG-ACNP Pulmonary & Critical Care

## 2017-05-06 NOTE — Discharge Instructions (Signed)
[facility] Failure [facility] appointment on May 17 2017 at 10:20am with Darylene Price, [place]. Please call [phone] to reschedule.

## 2017-05-06 NOTE — Progress Notes (Deleted)
Name: Angela Pham MRN: [id number] DOB: 1940-11-28    ADMISSION DATE:  05/04/2017 CONSULTATION DATE: 05/04/2017  REFERRING MD : Dr. Leslye Peer   CHIEF COMPLAINT: Shortness of Breath   BRIEF PATIENT DESCRIPTION:  76 year old female admitted in 08/8 with acute on chronic respiratory failure secondary to CHF vs. asthma exacerbation and questionable pneumonia requiring continuous BiPAP.  SIGNIFICANT EVENTS  08/8-Patient admitted to the stepdown unit  STUDIES:  None   HISTORY OF PRESENT ILLNESS:   This is a 76 year old female with a past medical history of type 2 diabetes mellitus, morbid obesity, hypertension, hyperlipidemia, combined CHF, and asthma.  She presented to [facility] ER 08/8 with complaints of shortness of breath, wheezing, productive cough, coughing up thick green phlegm, and chills onset of symptoms several days prior to presentation. Due to symptoms she notified EMS upon their arrival she received 125 mg iv solumedrol and 2 duoneb treatments.  In the ER due to persistent shortness of breath patient placed on continuous Bipap chest x-ray revealing pulmonary edema and possible pneumonia, initial troponin slightly elevated at 0.10, and lactic acid 3.7.  She was subsequently admitted to the stepdown unit by hospitalist team for further workup and treatment PCCM consulted.  Prior to Admission medications   Medication Sig Start Date End Date Taking? Authorizing Provider  aspirin EC 81 MG EC tablet Take 1 tablet (81 mg total) by mouth daily. 03/12/17  Yes Mody, Ulice Bold, MD  atorvastatin (LIPITOR) 40 MG tablet Take 40 mg by mouth daily.   Yes [provider]  furosemide (LASIX) 40 MG tablet Take 1 tablet (40 mg total) by mouth daily. 03/13/17  Yes Mody, Ulice Bold, MD  hydrALAZINE (APRESOLINE) 50 MG tablet Take 1 tablet (50 mg total) by mouth every 8 (eight) hours. Patient taking differently: Take 50 mg by mouth daily.  03/11/17  Yes Mody, Sital, MD  INCRUSE ELLIPTA 62.5 MCG/INH AEPB Inhale 1  puff into the lungs daily. 03/16/17  Yes [provider]  Ipratropium-Albuterol (COMBIVENT IN) Inhale 3 puffs into the lungs every 4 (four) hours as needed.   Yes [provider]  ipratropium-albuterol (DUONEB) 0.5-2.5 (3) MG/3ML SOLN Inhale 3 mLs into the lungs every 4 (four) hours as needed.   Yes [provider]  lactulose (CHRONULAC) 10 GM/15ML solution Take 30 g by mouth daily.   Yes [provider]  levocetirizine (XYZAL) 5 MG tablet Take 10 mg by mouth daily. 03/18/17  Yes [provider]  metFORMIN (GLUCOPHAGE) 850 MG tablet Take 1 tablet by mouth 2 (two) times daily.   Yes [provider]  mirtazapine (REMERON) 45 MG tablet Take 1 tablet by mouth at bedtime.   Yes [provider]  montelukast (SINGULAIR) 10 MG tablet Take 1 tablet (10 mg total) by mouth at bedtime. 03/11/17  Yes Mody, Ulice Bold, MD  Multiple Vitamins-Minerals (EYE VITAMINS PO) Take 1 tablet by mouth daily.   Yes [provider]  predniSONE (DELTASONE) 50 MG tablet Take 1 tablet for 4 days then stop Patient not taking: Reported on 05/04/2017 03/12/17   [name], MD  tiotropium (SPIRIVA) 18 MCG inhalation capsule Place 1 capsule (18 mcg total) into inhaler and inhale daily. Patient not taking: Reported on 05/04/2017 03/12/17   [name], MD  verapamil (CALAN-SR) 180 MG CR tablet Take 2 tablets (360 mg total) by mouth daily. 03/12/17 04/10/17  [name], MD   Allergies  Allergen Reactions  . Ace Inhibitors Anaphylaxis    Tongue swelling  . Shellfish Allergy Hives  and Swelling    Swelling around face and mouth    SUBJECTIVE:  8/10:  Patient was in severe respiratory distress last night on 8/9.  Her sats were down to 60%. Patient was treated with duonebX1, Methylprednisone 125 mg X1,and she was placed on Bipap.   VITAL SIGNS: Temp:  [97.9 F (36.6 C)-98.4 F (36.9 C)] 97.9 F (36.6 C) (08/09 2000) Pulse Rate:  [84-130] 85 (08/10 0100) Resp:  [14-36]  15 (08/10 0100) BP: (108-136)/(49-69) 113/61 (08/10 0100) SpO2:  [81 %-100 %] 99 % (08/10 0100) FiO2 (%):  [70 %-100 %] 70 % (08/09 2353)  PHYSICAL EXAMINATION: General: Well-developed, well-nourished, obese Caucasian female, NAD Neuro: Alert and oriented, follows commands HEENT: Supple, no JVD Cardiovascular: Sinus tach with bundle branch block, no M/R/G Lungs: Diminished throughout, even, labored;  wheezes,  No rales, rhonchi, or crackles on BiPAP Abdomen: Positive bowel sounds 4, soft, obese, nontender, nondistended Musculoskeletal: Normal bulk and tone, no edema Skin: Intact no rashes or lesions   Recent Labs Lab 05/04/17 1454 05/05/17 0026  NA 146* 143  K 4.2 3.9  CL 110 108  CO2 23 25  BUN 29* 29*  CREATININE 1.40* 1.38*  GLUCOSE 202* 217*    Recent Labs Lab 05/04/17 1454 05/05/17 0026  HGB 11.2* 10.6*  HCT 34.9* 31.6*  WBC 14.0* 10.8  PLT 342 271   US Renal  Result Date: 05/05/2017 CLINICAL DATA:  Acute renal failure, history hypertension, type II diabetes mellitus, CHF, hyperlipidemia EXAM: RENAL / URINARY TRACT ULTRASOUND COMPLETE COMPARISON:  None FINDINGS: Right Kidney: Length: 9.6 cm. Normal cortical thickness. Significantly increased cortical echogenicity. No mass, hydronephrosis or shadowing calcification. Left Kidney: Length: 9.9 cm. Normal cortical thickness. Diffusely increased cortical echogenicity. No hydronephrosis or shadowing calcification. Small cysts at the lateral mid LEFT kidney 9 x 10 x 8 mm and at the inferior pole 12 x 12 x 9 mm, both containing scattered low level internal echoes, appearing minimally complicated. Bladder: Appear distended with calculated volume of 980 mL. Patient was unable to void. No bladder wall thickening or obvious mass. IMPRESSION: Medical renal disease changes of both kidneys. Small mildly complicated appearing cysts in the LEFT kidney. Distended urinary bladder. Electronically Signed   By: Lavonia Dana M.D.   On: 05/05/2017  11:55   Dg Chest Port 1 View  Result Date: 05/05/2017 CLINICAL DATA:  Acute respiratory failure. EXAM: PORTABLE CHEST 1 VIEW COMPARISON:  Chest radiograph May 04, 2017 FINDINGS: Cardiac silhouette is similarly enlarged. Mediastinal silhouette is nonsuspicious. Pulmonary vascular congestion and mild interstitial prominence. Slight blunting of the costophrenic angles. No pneumothorax. Faint calcification in the neck are likely vascular. Mildly widened RIGHT acromioclavicular joint space. Soft tissue planes are nonsuspicious. IMPRESSION: Mild cardiomegaly and interstitial prominence compatible with pulmonary edema. Trace suspected pleural effusions. Electronically Signed   By: Elon Alas M.D.   On: 05/05/2017 05:38   Dg Chest Port 1 View  Result Date: 05/04/2017 CLINICAL DATA:  severe shortness of breath. EXAM: PORTABLE CHEST 1 VIEW COMPARISON:  03/07/17 FINDINGS: There is moderate cardiac enlargement. There is mild increase interstitial markings compatible with edema. Hazy infiltrate is noted within the right lung base. IMPRESSION: 1. Right lung base opacity.  Cannot rule out pneumonia. 2. Cardiac enlargement and mild pulmonary edema. Electronically Signed   By: Kerby Moors M.D.   On: 05/04/2017 17:45    ASSESSMENT / PLAN: Acute on chronic respiratory failure secondary to CHF vs. asthma exacerbation and questionable pneumonia Elevated troponins secondary to  demand ischemia versus NSTEMI Hx: Type II Diabetes Mellitus and HTN  Severe Anxiety Tachycardia Hx of Hypertension/Hyperlipidemia  P:  Continue Bipap wean as tolerated Increased Steroids, taper Continue Bronchodilators Added PRN metoprolol to keep HR<115 Continue aspirin Continue Atorvastatin Continue Verapamil/hydralazine Added Robitussin Continue Singulair Continue Lasix   Angela Pham,AG-ACNP Pulmonary & Critical Care

## 2017-05-06 NOTE — Progress Notes (Signed)
[facility] [place] NAME: Angela Pham    MR#:  [id number]  DATE OF BIRTH:  [birth year]  SUBJECTIVE:  CHIEF COMPLAINT:  Came with Shortness of breath, have Hx of asthma and had exacerbation. Required Bipap on admission, felt better and off Bipap , but again had respi distress in night and had to start back on BIPAP. Feeling better in noon time when I saw her.  REVIEW OF SYSTEMS:  CONSTITUTIONAL: No fever, fatigue or weakness.  EYES: No blurred or double vision.  EARS, NOSE, AND THROAT: No tinnitus or ear pain.  RESPIRATORY: positive for cough, shortness of breath, wheezing, no hemoptysis.  CARDIOVASCULAR: No chest pain, orthopnea, edema.  GASTROINTESTINAL: No nausea, vomiting, diarrhea or abdominal pain.  GENITOURINARY: No dysuria, hematuria.  ENDOCRINE: No polyuria, nocturia,  HEMATOLOGY: No anemia, easy bruising or bleeding SKIN: No rash or lesion. MUSCULOSKELETAL: No joint pain or arthritis.   NEUROLOGIC: No tingling, numbness, weakness.  PSYCHIATRY: No anxiety or depression.   ROS  DRUG ALLERGIES:   Allergies  Allergen Reactions  . Ace Inhibitors Anaphylaxis    Tongue swelling  . Shellfish Allergy Hives and Swelling    Swelling around face and mouth     VITALS:  Blood pressure 131/86, pulse (!) 109, temperature 98.6 F (37 C), temperature source Oral, resp. rate (!) 23, height 4\' 9"  (1.448 m), weight 85.7 kg (188 lb 15 oz), SpO2 96 %.  PHYSICAL EXAMINATION:  GENERAL:  76 y.o.-year-old patient lying in the bed with no acute distress.  EYES: Pupils equal, round, reactive to light and accommodation. No scleral icterus. Extraocular muscles intact.  HEENT: Head atraumatic, normocephalic. Oropharynx and nasopharynx clear.  NECK:  Supple, no jugular venous distention. No thyroid enlargement, no tenderness.  LUNGS: Normal breath sounds bilaterally, bilateral wheezing, no crepitation. No use of accessory muscles of respiration.   CARDIOVASCULAR: S1, S2 normal. No murmurs, rubs, or gallops.  ABDOMEN: Soft, nontender, nondistended. Bowel sounds present. No organomegaly or mass.  EXTREMITIES: No pedal edema, cyanosis, or clubbing.  NEUROLOGIC: Cranial nerves II through XII are intact. Muscle strength 4/5 in all extremities. Sensation intact. Gait not checked.  PSYCHIATRIC: The patient is alert and oriented x 3.  SKIN: No obvious rash, lesion, or ulcer.   Physical Exam LABORATORY PANEL:   CBC  Recent Labs Lab 05/05/17 0026  WBC 10.8  HGB 10.6*  HCT 31.6*  PLT 271   ------------------------------------------------------------------------------------------------------------------  Chemistries   Recent Labs Lab 05/04/17 1454 05/05/17 0026  NA 146* 143  K 4.2 3.9  CL 110 108  CO2 23 25  GLUCOSE 202* 217*  BUN 29* 29*  CREATININE 1.40* 1.38*  CALCIUM 9.1 8.8*  AST 33  --   ALT 21  --   ALKPHOS 61  --   BILITOT 0.5  --    ------------------------------------------------------------------------------------------------------------------  Cardiac Enzymes  Recent Labs Lab 05/05/17 0026 05/05/17 0608  TROPONINI 0.21* 0.20*   ------------------------------------------------------------------------------------------------------------------  RADIOLOGY:  Dg Chest 1 View  Result Date: 05/06/2017 CLINICAL DATA:  Shortness of breath.  History of CHF EXAM: CHEST 1 VIEW COMPARISON:  05/05/2017 FINDINGS: There is moderate cardiac enlargement. Aortic atherosclerosis. Mild pulmonary edema identified. No airspace opacities IMPRESSION: 1. Cardiac enlargement and mild pulmonary edema compatible with CHF. 2.  Aortic Atherosclerosis (ICD10-I70.0). Electronically Signed   By: Kerby Moors M.D.   On: 05/06/2017 09:32   US Renal  Result Date: 05/05/2017 CLINICAL DATA:  Acute renal failure, history hypertension, type II  diabetes mellitus, CHF, hyperlipidemia EXAM: RENAL / URINARY TRACT ULTRASOUND COMPLETE  COMPARISON:  None FINDINGS: Right Kidney: Length: 9.6 cm. Normal cortical thickness. Significantly increased cortical echogenicity. No mass, hydronephrosis or shadowing calcification. Left Kidney: Length: 9.9 cm. Normal cortical thickness. Diffusely increased cortical echogenicity. No hydronephrosis or shadowing calcification. Small cysts at the lateral mid LEFT kidney 9 x 10 x 8 mm and at the inferior pole 12 x 12 x 9 mm, both containing scattered low level internal echoes, appearing minimally complicated. Bladder: Appear distended with calculated volume of 980 mL. Patient was unable to void. No bladder wall thickening or obvious mass. IMPRESSION: Medical renal disease changes of both kidneys. Small mildly complicated appearing cysts in the LEFT kidney. Distended urinary bladder. Electronically Signed   By: Lavonia Dana M.D.   On: 05/05/2017 11:55   Dg Chest Port 1 View  Result Date: 05/05/2017 CLINICAL DATA:  Acute respiratory failure. EXAM: PORTABLE CHEST 1 VIEW COMPARISON:  Chest radiograph May 04, 2017 FINDINGS: Cardiac silhouette is similarly enlarged. Mediastinal silhouette is nonsuspicious. Pulmonary vascular congestion and mild interstitial prominence. Slight blunting of the costophrenic angles. No pneumothorax. Faint calcification in the neck are likely vascular. Mildly widened RIGHT acromioclavicular joint space. Soft tissue planes are nonsuspicious. IMPRESSION: Mild cardiomegaly and interstitial prominence compatible with pulmonary edema. Trace suspected pleural effusions. Electronically Signed   By: Elon Alas M.D.   On: 05/05/2017 05:38    ASSESSMENT AND PLAN:   Active Problems:   Acute respiratory failure with hypoxia (HCC)   SOB (shortness of breath)  1.  Acute respiratory failure. Patient placed on BiPAP in order to oxygenate. She have wish to DO NOT RESUSCITATE.   Improved in ICU off bipap   - on nasal canula oxygen.   Had respi distress in 05/05/17 night, but stable after bipap  use.  2. Asthma exacerbation. IV Solu-Medrol 40 mg daily. Budesonide and DuoNeb nebulizer solution. 3.  Clinical sepsis with pneumonia- suspected on admission , but ruled out.  Was On Rocephin and Zithromax. Intensivist stopped rocephin, as less likely.   Cont azithromycin for bronchitis. 4.  Lactic acidosis. Discontinue metformin. Could be from sepsis or from metformin. 5. Ac on ch diastolic congestive heart failure.   had respi distress 05/05/17 night, responded to lasix inj. 6. Chronic kidney disease stage III. Slight worsening in renal func.   Baseline creatinine is 1.0- appreciated nephrology consult. 7. Type 2 diabetes mellitus. Last hemoglobin A1c 5.8. Sliding scale for now. Hold metformin. 8. Morbid obesity. Weight loss needed 9. Essential hypertension tachycardia on verapamil 10. Hyperlipidemia unspecified on atorvastatin 11. Anxiety- started low dose xanax to help.  All the records are reviewed and case discussed with Care Management/Social Workerr. Management plans discussed with the patient, family and they are in agreement.  CODE STATUS: DNR  TOTAL TIME TAKING CARE OF THIS PATIENT: 35 minutes.    POSSIBLE D/C IN 1-2 DAYS, DEPENDING ON CLINICAL CONDITION.   Vaughan Basta M.D on 05/06/2017   Between 7am to 6pm - Pager - [phone]  After 6pm go to [email or web address] - password [email or web address] [place] [facility]  Office  [phone]  CC: Primary care physician; Center, [facility]  Note: This dictation was prepared with Dragon dictation along with smaller phrase technology. Any transcriptional errors that result from this process are unintentional.

## 2017-05-06 NOTE — Progress Notes (Signed)
   05/06/17 0750  Vitals  Pulse Rate (!) 117  ECG Heart Rate (!) 112  Resp (!) 22  Oxygen Therapy  SpO2 99 %  O2 Device Bi-PAP  FiO2 (%) 70 %  Pulse Oximetry Type Continuous   Pt with increased dyspnea at rest.  HR 150 with runs of PVCs and frequent PACs.  Pt place on BIPAP at 70% FiO2 at 0740.  Dr Mortimer Fries notified of patient's condition.  Orders received and completed.   Marguarite Arbour, RN

## 2017-05-06 NOTE — Progress Notes (Signed)
Pt stabilizing on BIPAP.  Will administer am meds when off BIPAP.

## 2017-05-07 ENCOUNTER — Inpatient Hospital Stay: Payer: Medicare Other

## 2017-05-07 LAB — CBC
HCT: 29.6 % — ABNORMAL LOW (ref 35.0–47.0)
Hemoglobin: 9.8 g/dL — ABNORMAL LOW (ref 12.0–16.0)
MCH: 29 pg (ref 26.0–34.0)
MCHC: 33 g/dL (ref 32.0–36.0)
MCV: 87.7 fL (ref 80.0–100.0)
Platelets: 288 10*3/uL (ref 150–440)
RBC: 3.37 MIL/uL — ABNORMAL LOW (ref 3.80–5.20)
RDW: 15.8 % — AB (ref 11.5–14.5)
WBC: 16.7 10*3/uL — ABNORMAL HIGH (ref 3.6–11.0)

## 2017-05-07 LAB — GLUCOSE, CAPILLARY
GLUCOSE-CAPILLARY: 211 mg/dL — AB (ref 65–99)
GLUCOSE-CAPILLARY: 304 mg/dL — AB (ref 65–99)
Glucose-Capillary: 141 mg/dL — ABNORMAL HIGH (ref 65–99)
Glucose-Capillary: 169 mg/dL — ABNORMAL HIGH (ref 65–99)

## 2017-05-07 LAB — BASIC METABOLIC PANEL
Anion gap: 9 (ref 5–15)
BUN: 50 mg/dL — ABNORMAL HIGH (ref 6–20)
CALCIUM: 8.8 mg/dL — AB (ref 8.9–10.3)
CHLORIDE: 106 mmol/L (ref 101–111)
CO2: 27 mmol/L (ref 22–32)
CREATININE: 1.44 mg/dL — AB (ref 0.44–1.00)
GFR calc Af Amer: 41 mL/min — ABNORMAL LOW (ref >60)
GFR calc non Af Amer: 35 mL/min — ABNORMAL LOW (ref >60)
GLUCOSE: 204 mg/dL — AB (ref 65–99)
Potassium: 4.3 mmol/L (ref 3.5–5.1)
SODIUM: 142 mmol/L (ref 135–145)

## 2017-05-07 LAB — PHOSPHORUS: PHOSPHORUS: 4 mg/dL (ref 2.5–4.6)

## 2017-05-07 LAB — MAGNESIUM: Magnesium: 1.7 mg/dL (ref 1.7–2.4)

## 2017-05-07 MED ORDER — METOPROLOL TARTRATE 25 MG PO TABS
12.5000 mg | ORAL_TABLET | Freq: Two times a day (BID) | ORAL | Status: DC
  Administered 2017-05-07 – 2017-05-08 (×3): 12.5 mg via ORAL
  Filled 2017-05-07 (×3): qty 1

## 2017-05-07 MED ORDER — FUROSEMIDE 40 MG PO TABS
40.0000 mg | ORAL_TABLET | Freq: Every day | ORAL | Status: DC
Start: 2017-05-07 — End: 2017-05-08
  Administered 2017-05-07 – 2017-05-08 (×2): 40 mg via ORAL
  Filled 2017-05-07: qty 1
  Filled 2017-05-07: qty 2

## 2017-05-07 MED ORDER — IPRATROPIUM-ALBUTEROL 0.5-2.5 (3) MG/3ML IN SOLN
3.0000 mL | RESPIRATORY_TRACT | Status: DC | PRN
  Administered 2017-05-08: 3 mL via RESPIRATORY_TRACT
  Filled 2017-05-07: qty 3

## 2017-05-07 MED ORDER — IPRATROPIUM-ALBUTEROL 0.5-2.5 (3) MG/3ML IN SOLN
3.0000 mL | Freq: Three times a day (TID) | RESPIRATORY_TRACT | Status: DC
  Administered 2017-05-08: 3 mL via RESPIRATORY_TRACT
  Filled 2017-05-07: qty 3

## 2017-05-07 MED ORDER — IPRATROPIUM-ALBUTEROL 0.5-2.5 (3) MG/3ML IN SOLN
3.0000 mL | Freq: Four times a day (QID) | RESPIRATORY_TRACT | Status: DC
  Administered 2017-05-07: 3 mL via RESPIRATORY_TRACT
  Filled 2017-05-07: qty 3

## 2017-05-07 NOTE — Progress Notes (Signed)
* [facility]    ASSESSMENT / PLAN: Acute on chronic respiratory failure secondary to CHF vs. asthma exacerbation and questionable pneumonia Elevated troponins secondary to demand ischemia versus NSTEMI Hx: Type II Diabetes Mellitus and HTN  Severe Anxiety Tachycardia Hx of Hypertension/Hyperlipidemia  P:  Taper Steroids. Continue Bronchodilators; discharge with prescription for incruz once daily.  Continue aspirin Continue Atorvastatin Continue Verapamil/hydralazine Added Robitussin Continue Singulair Continue Lasix Outpt follow up with pulmonary.  Symptoms and signs of obstructive sleep apnea, will need outpatient sleep study.    Date: 05/07/2017  MRN# [id number] Angela Pham 1941-06-28   Angela Pham is a 76 y.o. old female seen in follow up for chief complaint of  No chief complaint on file.    HPI:   Pt is feeling better, her breathing is better.   Medication:    Current Facility-Administered Medications:  .  acetaminophen (TYLENOL) tablet 650 mg, 650 mg, Oral, Q6H PRN **OR** acetaminophen (TYLENOL) suppository 650 mg, 650 mg, Rectal, Q6H PRN, Wieting, Richard, MD .  ALPRAZolam Duanne Moron) tablet 0.25 mg, 0.25 mg, Oral, TID PRN, Vaughan Basta, MD, 0.25 mg at 05/07/17 0758 .  aspirin EC tablet 81 mg, 81 mg, Oral, Daily, Loletha Grayer, MD, 81 mg at 05/07/17 0758 .  atorvastatin (LIPITOR) tablet 40 mg, 40 mg, Oral, q1800, Loletha Grayer, MD, 40 mg at 05/06/17 1658 .  [COMPLETED] azithromycin (ZITHROMAX) tablet 500 mg, 500 mg, Oral, Daily, 500 mg at 05/06/17 1104 **FOLLOWED BY** azithromycin (ZITHROMAX) tablet 250 mg, 250 mg, Oral, Daily, Kasa, Kurian, MD, 250 mg at 05/07/17 0758 .  budesonide (PULMICORT) nebulizer solution 0.5 mg, 0.5 mg, Nebulization, BID, Leslye Peer, Richard, MD, 0.5 mg at 05/07/17 0731 .  enoxaparin (LOVENOX) injection 40 mg, 40 mg, Subcutaneous, Q24H, Vaughan Basta, MD, 40 mg at 05/06/17 2130 .  ferrous  sulfate tablet 325 mg, 325 mg, Oral, BID WC, Lateef, Munsoor, MD, 325 mg at 05/07/17 0758 .  furosemide (LASIX) tablet 40 mg, 40 mg, Oral, Daily, Lateef, Munsoor, MD, 40 mg at 05/07/17 1009 .  guaiFENesin (ROBITUSSIN) 100 MG/5ML solution 100 mg, 5 mL, Oral, Q4H PRN, Varughese, Bincy S, NP, 100 mg at 05/07/17 0803 .  insulin aspart (novoLOG) injection 0-5 Units, 0-5 Units, Subcutaneous, QHS, Wieting, Richard, MD .  insulin aspart (novoLOG) injection 0-9 Units, 0-9 Units, Subcutaneous, TID WC, Loletha Grayer, MD, 3 Units at 05/07/17 0801 .  ipratropium-albuterol (DUONEB) 0.5-2.5 (3) MG/3ML nebulizer solution 3 mL, 3 mL, Nebulization, Q4H, Kasa, Kurian, MD, 3 mL at 05/07/17 1100 .  lactulose (CHRONULAC) 10 GM/15ML solution 30 g, 30 g, Oral, Daily, Leslye Peer, Richard, MD, 30 g at 05/07/17 0609 .  loratadine (CLARITIN) tablet 10 mg, 10 mg, Oral, Daily, Leslye Peer, Richard, MD, 10 mg at 05/07/17 0758 .  MEDLINE mouth rinse, 15 mL, Mouth Rinse, BID, Varughese, Bincy S, NP, 15 mL at 05/07/17 0759 .  methylPREDNISolone sodium succinate (SOLU-MEDROL) 125 mg/2 mL injection 125 mg, 125 mg, Intravenous, Once, Varughese, Bincy S, NP .  methylPREDNISolone sodium succinate (SOLU-MEDROL) 40 mg/mL injection 40 mg, 40 mg, Intravenous, Q12H, Kasa, Kurian, MD, 40 mg at 05/07/17 0609 .  metoprolol tartrate (LOPRESSOR) injection 2.5-5 mg, 2.5-5 mg, Intravenous, Q3H PRN, Varughese, Bincy S, NP, 5 mg at 05/05/17 1931 .  metoprolol tartrate (LOPRESSOR) tablet 12.5 mg, 12.5 mg, Oral, BID, Vaughan Basta, MD, 12.5 mg at 05/07/17 1112 .  mirtazapine (REMERON) tablet 45 mg, 45 mg, Oral, QHS, Wieting, Richard, MD, 45 mg at 05/06/17 2130 .  montelukast (SINGULAIR)  tablet 10 mg, 10 mg, Oral, QHS, Wieting, Richard, MD, 10 mg at 05/06/17 2130 .  multivitamin-lutein (OCUVITE-LUTEIN) capsule 1 capsule, 1 capsule, Oral, Daily, Leslye Peer, Richard, MD, 1 capsule at 05/07/17 0759 .  verapamil (CALAN-SR) CR tablet 180 mg, 180 mg, Oral,  Daily, Wieting, Richard, MD, 180 mg at 05/07/17 5053   Allergies:  Ace inhibitors and Shellfish allergy  Review of Systems: Gen:  Denies  fever, sweats. HEENT: Denies blurred vision. Cvc:  No dizziness, chest pain or heaviness Resp:   Denies cough or sputum porduction. Gi: Denies swallowing difficulty, stomach pain. constipation, bowel incontinence Gu:  Denies bladder incontinence, burning urine Ext:   No Joint pain, stiffness. Skin: No skin rash, easy bruising. Endoc:  No polyuria, polydipsia. Psych: No depression, insomnia. Other:  All other systems were reviewed and found to be negative other than what is mentioned in the HPI.   Physical Examination:   VS: BP (!) 148/99   Pulse 93   Temp 97.7 F (36.5 C) (Oral)   Resp 20   Ht 4\' 9"  (1.448 m)   Wt 188 lb 15 oz (85.7 kg)   SpO2 95%   BMI 40.89 kg/m    General Appearance: No distress  Neuro:without focal findings,  speech normal,  HEENT: PERRLA, EOM intact. Pulmonary: normal breath sounds, No wheezing.   CardiovascularNormal S1,S2.  No m/r/g.   Abdomen: Benign, Soft, non-tender. Renal:  No costovertebral tenderness  GU:  Not performed at this time. Endoc: No evident thyromegaly, no signs of acromegaly. Skin:   warm, no rash. Extremities: normal, no cyanosis, clubbing.   LABORATORY PANEL:   CBC  Recent Labs Lab 05/07/17 0445  WBC 16.7*  HGB 9.8*  HCT 29.6*  PLT 288   ------------------------------------------------------------------------------------------------------------------  Chemistries   Recent Labs Lab 05/04/17 1454  05/07/17 0445 05/07/17 0452  NA 146*  < > 142  --   K 4.2  < > 4.3  --   CL 110  < > 106  --   CO2 23  < > 27  --   GLUCOSE 202*  < > 204*  --   BUN 29*  < > 50*  --   CREATININE 1.40*  < > 1.44*  --   CALCIUM 9.1  < > 8.8*  --   MG  --   --   --  1.7  AST 33  --   --   --   ALT 21  --   --   --   ALKPHOS 61  --   --   --   BILITOT 0.5  --   --   --   < > = values in  this interval not displayed. ------------------------------------------------------------------------------------------------------------------  Cardiac Enzymes  Recent Labs Lab 05/05/17 0608  TROPONINI 0.20*   ------------------------------------------------------------  RADIOLOGY:   No results found for this or any previous visit. No results found for this or any previous visit. ------------------------------------------------------------------------------------------------------------------  Thank  you for allowing [facility] to assist in the care of your patient. Our recommendations are noted above.  Please contact us if we can be of further service.   Marda Stalker, MD.  [facility] Office Number: [phone]  Patricia Pesa, M.D.  Merton Border, M.D  05/07/2017

## 2017-05-07 NOTE — Progress Notes (Signed)
Decreased to 1l [place] after neb treatment.

## 2017-05-07 NOTE — Progress Notes (Signed)
[facility]  ROUNDING NOTE   Subjective:  Respiratory status improved this a.m.  patient off of BiPAP. Renal function stable as creatinine is currently 1.4. BUN noted to be slightly higher however. Patient likely to transition to floor care today.   Objective:  Vital signs in last 24 hours:  Temp:  [97.7 F (36.5 C)-98.6 F (37 C)] 97.7 F (36.5 C) (08/11 0800) Pulse Rate:  [89-122] 121 (08/11 0801) Resp:  [14-31] 29 (08/11 0801) BP: (108-168)/(69-150) 124/98 (08/11 0800) SpO2:  [84 %-100 %] 93 % (08/11 0801)  Weight change:  Filed Weights   05/04/17 1811  Weight: 85.7 kg (188 lb 15 oz)    Intake/Output: I/O last 3 completed shifts: In: 360 [P.O.:360] Out: 7741 [Urine:3260]   Intake/Output this shift:  No intake/output data recorded.  Physical Exam: General: No acute distress  Head: Normocephalic, atraumatic. Moist oral mucosal membranes  Eyes: Anicteric  Neck: Supple, trachea midline  Lungs:  Scattered rhonchi, Normal effort   Heart: S1S2 no rubs  Abdomen:  Soft, nontender, bowel sounds present  Extremities: Trace peripheral edema.  Neurologic: Awake, alert, following commands  Skin: No lesions       Basic Metabolic Panel:  Recent Labs Lab 05/04/17 1454 05/05/17 0026 05/07/17 0445 05/07/17 0452  NA 146* 143 142  --   K 4.2 3.9 4.3  --   CL 110 108 106  --   CO2 23 25 27   --   GLUCOSE 202* 217* 204*  --   BUN 29* 29* 50*  --   CREATININE 1.40* 1.38* 1.44*  --   CALCIUM 9.1 8.8* 8.8*  --   MG  --   --   --  1.7  PHOS  --   --   --  4.0    Liver Function Tests:  Recent Labs Lab 05/04/17 1454  AST 33  ALT 21  ALKPHOS 61  BILITOT 0.5  PROT 7.1  ALBUMIN 3.4*   No results for input(s): LIPASE, AMYLASE in the last 168 hours. No results for input(s): AMMONIA in the last 168 hours.  CBC:  Recent Labs Lab 05/04/17 1454 05/05/17 0026 05/07/17 0445  WBC 14.0* 10.8 16.7*  NEUTROABS 8.4*  --   --   HGB 11.2* 10.6* 9.8*   HCT 34.9* 31.6* 29.6*  MCV 90.5 89.1 87.7  PLT 342 271 288    Cardiac Enzymes:  Recent Labs Lab 05/04/17 1454 05/04/17 1842 05/05/17 0026 05/05/17 0608  TROPONINI 0.10* 0.19* 0.21* 0.20*    BNP: Invalid input(s): POCBNP  CBG:  Recent Labs Lab 05/06/17 0740 05/06/17 1159 05/06/17 1648 05/06/17 2124 05/07/17 0732  GLUCAP 169* 190* 157* 147* 70*    Microbiology: Results for orders placed or performed during the hospital encounter of 05/04/17  Culture, blood (Routine X 2) w Reflex to ID Panel     Status: None (Preliminary result)   Collection Time: 05/04/17  2:54 PM  Result Value Ref Range Status   Specimen Description BLOOD RIGHT ANTECUBITAL  Final   Special Requests   Final    BOTTLES DRAWN AEROBIC AND ANAEROBIC Blood Culture adequate volume   Culture NO GROWTH 3 DAYS  Final   Report Status PENDING  Incomplete  Culture, blood (Routine X 2) w Reflex to ID Panel     Status: None (Preliminary result)   Collection Time: 05/04/17  2:54 PM  Result Value Ref Range Status   Specimen Description BLOOD LEFT ANTECUBITAL  Final   Special Requests  Final    BOTTLES DRAWN AEROBIC AND ANAEROBIC Blood Culture adequate volume   Culture NO GROWTH 3 DAYS  Final   Report Status PENDING  Incomplete  MRSA PCR Screening     Status: None   Collection Time: 05/04/17  6:14 PM  Result Value Ref Range Status   MRSA by PCR NEGATIVE NEGATIVE Final    Comment:        The GeneXpert MRSA Assay (FDA approved for NASAL specimens only), is one component of a comprehensive MRSA colonization surveillance program. It is not intended to diagnose MRSA infection nor to guide or monitor treatment for MRSA infections.   Culture, expectorated sputum-assessment     Status: None   Collection Time: 05/04/17  6:59 PM  Result Value Ref Range Status   Specimen Description EXPECTORATED SPUTUM  Final   Special Requests NONE  Final   Sputum evaluation   Final    Sputum specimen not acceptable for  testing.  Please recollect.     Report Status 05/04/2017 FINAL  Final    Coagulation Studies:  Recent Labs  05/04/17 1454  LABPROT 13.3  INR 1.01    Urinalysis: No results for input(s): COLORURINE, LABSPEC, PHURINE, GLUCOSEU, HGBUR, BILIRUBINUR, KETONESUR, PROTEINUR, UROBILINOGEN, NITRITE, LEUKOCYTESUR in the last 72 hours.  Invalid input(s): APPERANCEUR    Imaging: Dg Chest 1 View  Result Date: 05/06/2017 CLINICAL DATA:  Shortness of breath.  History of CHF EXAM: CHEST 1 VIEW COMPARISON:  05/05/2017 FINDINGS: There is moderate cardiac enlargement. Aortic atherosclerosis. Mild pulmonary edema identified. No airspace opacities IMPRESSION: 1. Cardiac enlargement and mild pulmonary edema compatible with CHF. 2.  Aortic Atherosclerosis (ICD10-I70.0). Electronically Signed   By: Kerby Moors M.D.   On: 05/06/2017 09:32   US Renal  Result Date: 05/05/2017 CLINICAL DATA:  Acute renal failure, history hypertension, type II diabetes mellitus, CHF, hyperlipidemia EXAM: RENAL / URINARY TRACT ULTRASOUND COMPLETE COMPARISON:  None FINDINGS: Right Kidney: Length: 9.6 cm. Normal cortical thickness. Significantly increased cortical echogenicity. No mass, hydronephrosis or shadowing calcification. Left Kidney: Length: 9.9 cm. Normal cortical thickness. Diffusely increased cortical echogenicity. No hydronephrosis or shadowing calcification. Small cysts at the lateral mid LEFT kidney 9 x 10 x 8 mm and at the inferior pole 12 x 12 x 9 mm, both containing scattered low level internal echoes, appearing minimally complicated. Bladder: Appear distended with calculated volume of 980 mL. Patient was unable to void. No bladder wall thickening or obvious mass. IMPRESSION: Medical renal disease changes of both kidneys. Small mildly complicated appearing cysts in the LEFT kidney. Distended urinary bladder. Electronically Signed   By: Lavonia Dana M.D.   On: 05/05/2017 11:55   Dg Chest Port 1 View  Result Date:  05/07/2017 CLINICAL DATA:  Congestive heart failure. EXAM: PORTABLE CHEST 1 VIEW COMPARISON:  One-view chest x-ray 05/06/2017 FINDINGS: The heart is enlarged. Mild edema is stable. Bibasilar airspace disease likely reflects atelectasis without significant interval change. IMPRESSION: 1. Stable cardiac enlargement and mild pulmonary edema. 2. Stable bibasilar airspace disease, likely atelectasis. Electronically Signed   By: San Morelle M.D.   On: 05/07/2017 08:03     Medications:    . aspirin EC  81 mg Oral Daily  . atorvastatin  40 mg Oral q1800  . azithromycin  250 mg Oral Daily  . budesonide (PULMICORT) nebulizer solution  0.5 mg Nebulization BID  . enoxaparin (LOVENOX) injection  40 mg Subcutaneous Q24H  . ferrous sulfate  325 mg Oral BID WC  . furosemide  40 mg Intravenous Daily  . insulin aspart  0-5 Units Subcutaneous QHS  . insulin aspart  0-9 Units Subcutaneous TID WC  . ipratropium-albuterol  3 mL Nebulization Q4H  . lactulose  30 g Oral Daily  . loratadine  10 mg Oral Daily  . mouth rinse  15 mL Mouth Rinse BID  . methylPREDNISolone (SOLU-MEDROL) injection  125 mg Intravenous Once  . methylPREDNISolone (SOLU-MEDROL) injection  40 mg Intravenous Q12H  . mirtazapine  45 mg Oral QHS  . montelukast  10 mg Oral QHS  . multivitamin-lutein  1 capsule Oral Daily  . verapamil  180 mg Oral Daily   acetaminophen **OR** acetaminophen, ALPRAZolam, guaiFENesin, metoprolol tartrate  Assessment/ Plan:  76 y.o. female with a PMHx of Asthma, congestive heart failure EF 45-50%, hypertension, hyperlipidemia, morbid obesity, diabetes mellitus type 2, chronic kidney disease stage III, who was admitted to [facility] on 05/04/2017 for evaluation of shortness of breath.  1. Acute renal failure/chronic kidney disease stage III Baseline creatinine 1.0 with an EGFR 55. Recently the patient has been taking Advil for bilateral knee pain. She also has risk factors for chronic kidney disease including  hypertension and diabetes mellitus type 2. -  Creatinine stable however BUN noted to be higher. Suspect that BUN higher secondary to steroids. We will change Lasix to by mouth.  2. Acute respiratory failure.  Currently off BiPAP. Respiratory status has improved. Continue steroids, nebulizer therapy, and supplemental oxygen as per pulmonary medicine.  3.  Anemia of chronic kidney disease:  Continue ferrous sulfate 325 mg by mouth twice a day.  LOS: 3 Doha Boling 8/11/20189:53 AM

## 2017-05-07 NOTE — Progress Notes (Signed)
[facility] [place] NAME: Angela Pham    MR#:  [id number]  DATE OF BIRTH:  02/21/1941  SUBJECTIVE:  CHIEF COMPLAINT:  Came with Shortness of breath, have Hx of asthma and had exacerbation. Required Bipap on admission, felt better and off Bipap , no supplemental oxygen and feels strong now, though did not walk yet In hospital.  REVIEW OF SYSTEMS:  CONSTITUTIONAL: No fever, fatigue or weakness.  EYES: No blurred or double vision.  EARS, NOSE, AND THROAT: No tinnitus or ear pain.  RESPIRATORY: positive for cough, shortness of breath, wheezing, no hemoptysis.  CARDIOVASCULAR: No chest pain, orthopnea, edema.  GASTROINTESTINAL: No nausea, vomiting, diarrhea or abdominal pain.  GENITOURINARY: No dysuria, hematuria.  ENDOCRINE: No polyuria, nocturia,  HEMATOLOGY: No anemia, easy bruising or bleeding SKIN: No rash or lesion. MUSCULOSKELETAL: No joint pain or arthritis.   NEUROLOGIC: No tingling, numbness, weakness.  PSYCHIATRY: No anxiety or depression.   ROS  DRUG ALLERGIES:   Allergies  Allergen Reactions  . Ace Inhibitors Anaphylaxis    Tongue swelling  . Shellfish Allergy Hives and Swelling    Swelling around face and mouth     VITALS:  Blood pressure (!) 148/99, pulse 93, temperature 97.7 F (36.5 C), temperature source Oral, resp. rate 20, height 4\' 9"  (1.448 m), weight 85.7 kg (188 lb 15 oz), SpO2 95 %.  PHYSICAL EXAMINATION:  GENERAL:  76 y.o.-year-old patient lying in the bed with no acute distress.  EYES: Pupils equal, round, reactive to light and accommodation. No scleral icterus. Extraocular muscles intact.  HEENT: Head atraumatic, normocephalic. Oropharynx and nasopharynx clear.  NECK:  Supple, no jugular venous distention. No thyroid enlargement, no tenderness.  LUNGS: Normal breath sounds bilaterally, bilateral some wheezing, no crepitation. No use of accessory muscles of respiration.  CARDIOVASCULAR: S1, S2 normal. No  murmurs, rubs, or gallops.  ABDOMEN: Soft, nontender, nondistended. Bowel sounds present. No organomegaly or mass.  EXTREMITIES: No pedal edema, cyanosis, or clubbing.  NEUROLOGIC: Cranial nerves II through XII are intact. Muscle strength 4/5 in all extremities. Sensation intact. Gait not checked.  PSYCHIATRIC: The patient is alert and oriented x 3.  SKIN: No obvious rash, lesion, or ulcer.   Physical Exam LABORATORY PANEL:   CBC  Recent Labs Lab 05/07/17 0445  WBC 16.7*  HGB 9.8*  HCT 29.6*  PLT 288   ------------------------------------------------------------------------------------------------------------------  Chemistries   Recent Labs Lab 05/04/17 1454  05/07/17 0445 05/07/17 0452  NA 146*  < > 142  --   K 4.2  < > 4.3  --   CL 110  < > 106  --   CO2 23  < > 27  --   GLUCOSE 202*  < > 204*  --   BUN 29*  < > 50*  --   CREATININE 1.40*  < > 1.44*  --   CALCIUM 9.1  < > 8.8*  --   MG  --   --   --  1.7  AST 33  --   --   --   ALT 21  --   --   --   ALKPHOS 61  --   --   --   BILITOT 0.5  --   --   --   < > = values in this interval not displayed. ------------------------------------------------------------------------------------------------------------------  Cardiac Enzymes  Recent Labs Lab 05/05/17 0026 05/05/17 0608  TROPONINI 0.21* 0.20*   ------------------------------------------------------------------------------------------------------------------  RADIOLOGY:  Dg Chest  1 View  Result Date: 05/06/2017 CLINICAL DATA:  Shortness of breath.  History of CHF EXAM: CHEST 1 VIEW COMPARISON:  05/05/2017 FINDINGS: There is moderate cardiac enlargement. Aortic atherosclerosis. Mild pulmonary edema identified. No airspace opacities IMPRESSION: 1. Cardiac enlargement and mild pulmonary edema compatible with CHF. 2.  Aortic Atherosclerosis (ICD10-I70.0). Electronically Signed   By: Kerby Moors M.D.   On: 05/06/2017 09:32   Dg Chest Port 1 View  Result  Date: 05/07/2017 CLINICAL DATA:  Congestive heart failure. EXAM: PORTABLE CHEST 1 VIEW COMPARISON:  One-view chest x-ray 05/06/2017 FINDINGS: The heart is enlarged. Mild edema is stable. Bibasilar airspace disease likely reflects atelectasis without significant interval change. IMPRESSION: 1. Stable cardiac enlargement and mild pulmonary edema. 2. Stable bibasilar airspace disease, likely atelectasis. Electronically Signed   By: San Morelle M.D.   On: 05/07/2017 08:03    ASSESSMENT AND PLAN:   Active Problems:   Acute respiratory failure with hypoxia (HCC)   SOB (shortness of breath)  1.  Acute respiratory failure. Patient placed on BiPAP in order to oxygenate. She have wish to DO NOT RESUSCITATE.    Improved in ICU off bipap  - on nasal canula oxygen.   Had respi distress in 05/05/17 night, but stable after bipap use.   Now on nasal canula, transfer to floor.  2. Asthma exacerbation. IV Solu-Medrol 40 mg daily. Budesonide and DuoNeb nebulizer solution.   Improving. Ambulate., 3.  Clinical sepsis with pneumonia- suspected on admission , but ruled out.  Was On Rocephin and Zithromax. Intensivist stopped rocephin, as less likely.   Cont azithromycin for bronchitis. 4.  Lactic acidosis. Discontinue metformin. Could be from sepsis or from metformin. 5. Ac on ch diastolic congestive heart failure.   had respi distress 05/05/17 night, responded to lasix inj.   Now oral lasix. 6. Chronic kidney disease stage III. Slight worsening in renal func.   Baseline creatinine is 1.0- appreciated nephrology consult.   Creatinin appears stable around 1.4 7. Type 2 diabetes mellitus. Last hemoglobin A1c 5.8. Sliding scale for now. Hold metformin. 8. Morbid obesity. Weight loss needed 9. Essential hypertension tachycardia on verapamil 10. Hyperlipidemia unspecified on atorvastatin 11. Anxiety- started low dose xanax to help.  All the records are reviewed and case discussed with Care Management/Social  Workerr. Management plans discussed with the patient, family and they are in agreement.  CODE STATUS: DNR  TOTAL TIME TAKING CARE OF THIS PATIENT: 35 minutes.    POSSIBLE D/C IN 1-2 DAYS, DEPENDING ON CLINICAL CONDITION.   Vaughan Basta M.D on 05/07/2017   Between 7am to 6pm - Pager - [phone]  After 6pm go to [email or web address] - password [email or web address] [facility]  Office  [phone]  CC: Primary care physician; Center, [facility]  Note: This dictation was prepared with Dragon dictation along with smaller phrase technology. Any transcriptional errors that result from this process are unintentional.

## 2017-05-08 LAB — BASIC METABOLIC PANEL
ANION GAP: 12 (ref 5–15)
BUN: 54 mg/dL — ABNORMAL HIGH (ref 6–20)
CHLORIDE: 103 mmol/L (ref 101–111)
CO2: 28 mmol/L (ref 22–32)
Calcium: 9.3 mg/dL (ref 8.9–10.3)
Creatinine, Ser: 1.43 mg/dL — ABNORMAL HIGH (ref 0.44–1.00)
GFR calc non Af Amer: 35 mL/min — ABNORMAL LOW (ref >60)
GFR, EST AFRICAN AMERICAN: 41 mL/min — AB (ref >60)
Glucose, Bld: 176 mg/dL — ABNORMAL HIGH (ref 65–99)
Potassium: 4.3 mmol/L (ref 3.5–5.1)
Sodium: 143 mmol/L (ref 135–145)

## 2017-05-08 LAB — CBC
HEMATOCRIT: 35.5 % (ref 35.0–47.0)
HEMOGLOBIN: 11.8 g/dL — AB (ref 12.0–16.0)
MCH: 29.6 pg (ref 26.0–34.0)
MCHC: 33.2 g/dL (ref 32.0–36.0)
MCV: 89.1 fL (ref 80.0–100.0)
Platelets: 401 10*3/uL (ref 150–440)
RBC: 3.98 MIL/uL (ref 3.80–5.20)
RDW: 15.5 % — ABNORMAL HIGH (ref 11.5–14.5)
WBC: 22.2 10*3/uL — AB (ref 3.6–11.0)

## 2017-05-08 LAB — PROCALCITONIN: PROCALCITONIN: 0.32 ng/mL

## 2017-05-08 LAB — GLUCOSE, CAPILLARY: GLUCOSE-CAPILLARY: 151 mg/dL — AB (ref 65–99)

## 2017-05-08 MED ORDER — PREDNISONE 10 MG (21) PO TBPK: ORAL_TABLET | ORAL | 0 refills | Status: DC

## 2017-05-08 MED ORDER — HYDRALAZINE HCL 25 MG PO TABS: 25.0000 mg | ORAL_TABLET | Freq: Three times a day (TID) | ORAL | 0 refills | Status: DC

## 2017-05-08 MED ORDER — AZITHROMYCIN 250 MG PO TABS: 250.0000 mg | ORAL_TABLET | Freq: Every day | ORAL | 0 refills | Status: AC

## 2017-05-08 MED ORDER — METOPROLOL TARTRATE 5 MG/5ML IV SOLN
2.5000 mg | INTRAVENOUS | Status: DC | PRN
  Administered 2017-05-08: 5 mg via INTRAVENOUS
  Filled 2017-05-08: qty 5

## 2017-05-08 MED ORDER — HYDRALAZINE HCL 25 MG PO TABS
25.0000 mg | ORAL_TABLET | Freq: Three times a day (TID) | ORAL | Status: DC
  Administered 2017-05-08: 25 mg via ORAL
  Filled 2017-05-08: qty 1

## 2017-05-08 MED ORDER — FERROUS SULFATE 325 (65 FE) MG PO TABS: 325.0000 mg | ORAL_TABLET | Freq: Two times a day (BID) | ORAL | 3 refills | Status: DC

## 2017-05-08 NOTE — Discharge Summary (Signed)
[facility] NAME: Angela Pham    MR#:  [id number]  DATE OF BIRTH:  07-24-1941  DATE OF ADMISSION:  05/04/2017 ADMITTING PHYSICIAN: Loletha Grayer, MD  DATE OF DISCHARGE: 05/08/2017  PRIMARY CARE PHYSICIAN: [facility] [place]    ADMISSION DIAGNOSIS:  Acute respiratory failure (HCC) [J96.00] SOB (shortness of breath) [R06.02]  DISCHARGE DIAGNOSIS:  Active Problems:   Acute respiratory failure with hypoxia (HCC)   SOB (shortness of breath)   Asthma exacerbation, acute bronchitis.   Ac on ch diastolic CHF  SECONDARY DIAGNOSIS:   Past Medical History:  Diagnosis Date  . Asthma   . CHF (congestive heart failure) ([place])   . Hyperlipidemia   . Hypertension   . Morbid obesity ([place])   . Type II diabetes mellitus ([place])     HOSPITAL COURSE:   1. Acute respiratory failure. Patient placed on BiPAP in order to oxygenate. She have wish to DO NOT RESUSCITATE.    Improved in ICU off bipap  - on nasal canula oxygen.   Had respi distress in 05/05/17 night, but stable after bipap use.   Now on nasal canula, transfer to floor.    Much better, on room air and able to ambulate without any difficulty.   WIllr efer to pulm clinic for sleep study and have CPAP arrangements.  2. Asthma exacerbation. IV Solu-Medrol 40 mg daily. Budesonide and DuoNeb nebulizer solution.   Improving. Ambulate.,   Tapering steroids on d/c. 3. Clinical sepsis with pneumonia- suspected on admission , but ruled out.  Was On Rocephin and Zithromax. Intensivist stopped rocephin, as less likely.   Cont azithromycin for bronchitis. 4. Lactic acidosis. Discontinue metformin. Could be from sepsis or from metformin. 5. Ac on ch diastolic congestive heart failure.   had respi distress 05/05/17 night, responded to lasix inj.   Now oral lasix. 6. Chronic kidney disease stage III. Slight worsening in renal func.   Baseline creatinine is 1.0-  appreciated nephrology consult.   Creatinin appears stable around 1.4 7. Type 2 diabetes mellitus. Last hemoglobin A1c 5.8. Sliding scale for now. Hold metformin. 8. Morbid obesity. Weight loss needed 9. Essential hypertension tachycardia on verapamil    Also takes hydralazine at home, resume. 10. Hyperlipidemia unspecified on atorvastatin 11. Anxiety- started low dose xanax to help. 12. Anemia of chronic dz- iron orally.  DISCHARGE CONDITIONS:   Stable.  CONSULTS OBTAINED:  Treatment Team:  Anthonette Legato, MD  DRUG ALLERGIES:   Allergies  Allergen Reactions  . Ace Inhibitors Anaphylaxis    Tongue swelling  . Shellfish Allergy Hives and Swelling    Swelling around face and mouth     DISCHARGE MEDICATIONS:   Current Discharge Medication List    START taking these medications   Details  azithromycin (ZITHROMAX) 250 MG tablet Take 1 tablet (250 mg total) by mouth daily. Qty: 3 each, Refills: 0    ferrous sulfate 325 (65 FE) MG tablet Take 1 tablet (325 mg total) by mouth 2 (two) times daily with a meal. Qty: 60 tablet, Refills: 3    predniSONE (STERAPRED UNI-PAK 21 TAB) 10 MG (21) TBPK tablet Take 6 tabs first day, 5 tab on day 2, then 4 on day 3rd, 3 tabs on day 4th , 2 tab on day 5th, and 1 tab on 6th day. Qty: 21 tablet, Refills: 0      CONTINUE these medications which have CHANGED   Details  hydrALAZINE (APRESOLINE) 25  MG tablet Take 1 tablet (25 mg total) by mouth every 8 (eight) hours. Qty: 90 tablet, Refills: 0      CONTINUE these medications which have NOT CHANGED   Details  aspirin EC 81 MG EC tablet Take 1 tablet (81 mg total) by mouth daily.    atorvastatin (LIPITOR) 40 MG tablet Take 40 mg by mouth daily.    furosemide (LASIX) 40 MG tablet Take 1 tablet (40 mg total) by mouth daily. Qty: 30 tablet, Refills: 0    INCRUSE ELLIPTA 62.5 MCG/INH AEPB Inhale 1 puff into the lungs daily.    Ipratropium-Albuterol (COMBIVENT IN) Inhale 3 puffs into the  lungs every 4 (four) hours as needed.    ipratropium-albuterol (DUONEB) 0.5-2.5 (3) MG/3ML SOLN Inhale 3 mLs into the lungs every 4 (four) hours as needed.    lactulose (CHRONULAC) 10 GM/15ML solution Take 30 g by mouth daily.    levocetirizine (XYZAL) 5 MG tablet Take 10 mg by mouth daily.    metFORMIN (GLUCOPHAGE) 850 MG tablet Take 1 tablet by mouth 2 (two) times daily.    mirtazapine (REMERON) 45 MG tablet Take 1 tablet by mouth at bedtime.    montelukast (SINGULAIR) 10 MG tablet Take 1 tablet (10 mg total) by mouth at bedtime. Qty: 30 tablet, Refills: 0    Multiple Vitamins-Minerals (EYE VITAMINS PO) Take 1 tablet by mouth daily.    tiotropium (SPIRIVA) 18 MCG inhalation capsule Place 1 capsule (18 mcg total) into inhaler and inhale daily. Qty: 30 capsule, Refills: 12    verapamil (CALAN-SR) 180 MG CR tablet Take 2 tablets (360 mg total) by mouth daily. Qty: 60 tablet, Refills: 0      STOP taking these medications     predniSONE (DELTASONE) 50 MG tablet          DISCHARGE INSTRUCTIONS:    Follow in pulm clinic in 1-2 weeks.  If you experience worsening of your admission symptoms, develop shortness of breath, life threatening emergency, suicidal or homicidal thoughts you must seek medical attention immediately by calling 911 or calling your MD immediately  if symptoms less severe.  You Must read complete instructions/literature along with all the possible adverse reactions/side effects for all the Medicines you take and that have been prescribed to you. Take any new Medicines after you have completely understood and accept all the possible adverse reactions/side effects.   Please note  You were cared for by a hospitalist during your hospital stay. If you have any questions about your discharge medications or the care you received while you were in the hospital after you are discharged, you can call the unit and asked to speak with the hospitalist on call if the  hospitalist that took care of you is not available. Once you are discharged, your primary care physician will handle any further medical issues. Please note that NO REFILLS for any discharge medications will be authorized once you are discharged, as it is imperative that you return to your primary care physician (or establish a relationship with a primary care physician if you do not have one) for your aftercare needs so that they can reassess your need for medications and monitor your lab values.    Today   CHIEF COMPLAINT:  No chief complaint on file.   HISTORY OF PRESENT ILLNESS:  Angela Pham  is a 76 y.o. female with a known history of Asthma. She presents to the ER with shortness of breath and chest pains that have been going  on for the last few nights. She needed to sit up at night in order to breathe. She states it started on Monday. He comes and goes. She states this is her worst exacerbation. Chest pains as dull and in the center of her chest and tight to breathe. The patient is also had a 4 pound weight gain. Feeling fatigued. She's been coughing up yellow green mucus. She's been wheezing a lot. In the ER, she was found to have an elevated lactate and placed on BiPAP and put on sepsis protocol.   VITAL SIGNS:  Blood pressure 133/60, pulse (!) 102, temperature (!) 97.5 F (36.4 C), temperature source Oral, resp. rate 17, height 4\' 9"  (1.448 m), weight 85.9 kg (189 lb 6.4 oz), SpO2 96 %.  I/O:   Intake/Output Summary (Last 24 hours) at 05/08/17 0956 Last data filed at 05/07/17 1000  Gross per 24 hour  Intake                0 ml  Output              600 ml  Net             -600 ml    PHYSICAL EXAMINATION:  GENERAL:  76 y.o.-year-old patient lying in the bed with no acute distress.  EYES: Pupils equal, round, reactive to light and accommodation. No scleral icterus. Extraocular muscles intact.  HEENT: Head atraumatic, normocephalic. Oropharynx and nasopharynx clear.  NECK:   Supple, no jugular venous distention. No thyroid enlargement, no tenderness.  LUNGS: Normal breath sounds bilaterally, minimal wheezing,no crepitation. No use of accessory muscles of respiration.  CARDIOVASCULAR: S1, S2 normal. No murmurs, rubs, or gallops.  ABDOMEN: Soft, non-tender, non-distended. Bowel sounds present. No organomegaly or mass.  EXTREMITIES: No pedal edema, cyanosis, or clubbing.  NEUROLOGIC: Cranial nerves II through XII are intact. Muscle strength 5/5 in all extremities. Sensation intact. Gait not checked.  PSYCHIATRIC: The patient is alert and oriented x 3.  SKIN: No obvious rash, lesion, or ulcer.   DATA REVIEW:   CBC  Recent Labs Lab 05/08/17 0443  WBC 22.2*  HGB 11.8*  HCT 35.5  PLT 401    Chemistries   Recent Labs Lab 05/04/17 1454  05/07/17 0452 05/08/17 0443  NA 146*  < >  --  143  K 4.2  < >  --  4.3  CL 110  < >  --  103  CO2 23  < >  --  28  GLUCOSE 202*  < >  --  176*  BUN 29*  < >  --  54*  CREATININE 1.40*  < >  --  1.43*  CALCIUM 9.1  < >  --  9.3  MG  --   --  1.7  --   AST 33  --   --   --   ALT 21  --   --   --   ALKPHOS 61  --   --   --   BILITOT 0.5  --   --   --   < > = values in this interval not displayed.  Cardiac Enzymes  Recent Labs Lab 05/05/17 0608  TROPONINI 0.20*    Microbiology Results  Results for orders placed or performed during the hospital encounter of 05/04/17  Culture, blood (Routine X 2) w Reflex to ID Panel     Status: None (Preliminary result)   Collection Time: 05/04/17  2:54 PM  Result Value Ref  Range Status   Specimen Description BLOOD RIGHT ANTECUBITAL  Final   Special Requests   Final    BOTTLES DRAWN AEROBIC AND ANAEROBIC Blood Culture adequate volume   Culture NO GROWTH 4 DAYS  Final   Report Status PENDING  Incomplete  Culture, blood (Routine X 2) w Reflex to ID Panel     Status: None (Preliminary result)   Collection Time: 05/04/17  2:54 PM  Result Value Ref Range Status   Specimen  Description BLOOD LEFT ANTECUBITAL  Final   Special Requests   Final    BOTTLES DRAWN AEROBIC AND ANAEROBIC Blood Culture adequate volume   Culture NO GROWTH 4 DAYS  Final   Report Status PENDING  Incomplete  MRSA PCR Screening     Status: None   Collection Time: 05/04/17  6:14 PM  Result Value Ref Range Status   MRSA by PCR NEGATIVE NEGATIVE Final    Comment:        The GeneXpert MRSA Assay (FDA approved for NASAL specimens only), is one component of a comprehensive MRSA colonization surveillance program. It is not intended to diagnose MRSA infection nor to guide or monitor treatment for MRSA infections.   Culture, expectorated sputum-assessment     Status: None   Collection Time: 05/04/17  6:59 PM  Result Value Ref Range Status   Specimen Description EXPECTORATED SPUTUM  Final   Special Requests NONE  Final   Sputum evaluation   Final    Sputum specimen not acceptable for testing.  Please recollect.     Report Status 05/04/2017 FINAL  Final    RADIOLOGY:  Dg Chest Port 1 View  Result Date: 05/07/2017 CLINICAL DATA:  Congestive heart failure. EXAM: PORTABLE CHEST 1 VIEW COMPARISON:  One-view chest x-ray 05/06/2017 FINDINGS: The heart is enlarged. Mild edema is stable. Bibasilar airspace disease likely reflects atelectasis without significant interval change. IMPRESSION: 1. Stable cardiac enlargement and mild pulmonary edema. 2. Stable bibasilar airspace disease, likely atelectasis. Electronically Signed   By: San Morelle M.D.   On: 05/07/2017 08:03    EKG:   Orders placed or performed during the hospital encounter of 03/07/17  . ED EKG  . ED EKG  . EKG 12-Lead  . EKG 12-Lead  . EKG      Management plans discussed with the patient, family and they are in agreement.  CODE STATUS:     Code Status Orders        Start     Ordered   05/04/17 0998  Do not attempt resuscitation (DNR)  Continuous    Question Answer Comment  In the event of cardiac or  respiratory ARREST Do not call a "code blue"   In the event of cardiac or respiratory ARREST Do not perform Intubation, CPR, defibrillation or ACLS   In the event of cardiac or respiratory ARREST Use medication by any route, position, wound care, and other measures to relive pain and suffering. May use oxygen, suction and manual treatment of airway obstruction as needed for comfort.   Comments nurse may pronounce      05/04/17 1719    Code Status History    Date Active Date Inactive Code Status Order ID Comments User Context   03/09/2017 12:34 PM 03/11/2017 10:08 PM Full Code [id number]  Minna Merritts, MD Inpatient   03/07/2017  5:12 AM 03/09/2017 12:34 PM Full Code [id number]  Saundra Shelling, MD Inpatient    Advance Directive Documentation     Most Recent Value  Type of Advance Directive  Healthcare Power of Attorney, Living will  Pre-existing out of facility DNR order (yellow form or pink MOST form)  -  "MOST" Form in Place?  -      TOTAL TIME TAKING CARE OF THIS PATIENT: 35 minutes.    Vaughan Basta M.D on 05/08/2017 at 9:56 AM  Between 7am to 6pm - Pager - [phone]  After 6pm go to [email or web address] - password [email or web address] [facility]  Office  [phone]  CC: Primary care physician; [facility]   Note: This dictation was prepared with Dragon dictation along with smaller phrase technology. Any transcriptional errors that result from this process are unintentional.

## 2017-05-08 NOTE — Progress Notes (Signed)
Angela Pham 1:28 PM 05/08/2017  Angela Pham to be D/C'd Home per MD order.  Discussed prescriptions and follow up appointments with the patient. Prescriptions given to patient, medication list explained in detail. Pt verbalized understanding.  Allergies as of 05/08/2017      Reactions   Ace Inhibitors Anaphylaxis   Tongue swelling   Shellfish Allergy Hives, Swelling   Swelling around face and mouth      Medication List    STOP taking these medications   predniSONE 50 MG tablet Commonly known as:  DELTASONE Replaced by:  predniSONE 10 MG (21) Tbpk tablet     TAKE these medications   aspirin 81 MG EC tablet Take 1 tablet (81 mg total) by mouth daily.   atorvastatin 40 MG tablet Commonly known as:  LIPITOR Take 40 mg by mouth daily.   azithromycin 250 MG tablet Commonly known as:  ZITHROMAX Take 1 tablet (250 mg total) by mouth daily.   COMBIVENT IN Inhale 3 puffs into the lungs every 4 (four) hours as needed.   DUONEB 0.5-2.5 (3) MG/3ML Soln Generic drug:  ipratropium-albuterol Inhale 3 mLs into the lungs every 4 (four) hours as needed.   EYE VITAMINS PO Take 1 tablet by mouth daily.   ferrous sulfate 325 (65 FE) MG tablet Take 1 tablet (325 mg total) by mouth 2 (two) times daily with a meal.   furosemide 40 MG tablet Commonly known as:  LASIX Take 1 tablet (40 mg total) by mouth daily.   hydrALAZINE 25 MG tablet Commonly known as:  APRESOLINE Take 1 tablet (25 mg total) by mouth every 8 (eight) hours. What changed:  medication strength  how much to take   INCRUSE ELLIPTA 62.5 MCG/INH Aepb Generic drug:  umeclidinium bromide Inhale 1 puff into the lungs daily.   lactulose 10 GM/15ML solution Commonly known as:  CHRONULAC Take 30 g by mouth daily.   levocetirizine 5 MG tablet Commonly known as:  XYZAL Take 10 mg by mouth daily.   metFORMIN 850 MG tablet Commonly known as:  GLUCOPHAGE Take 1 tablet by mouth 2 (two) times daily.    mirtazapine 45 MG tablet Commonly known as:  REMERON Take 1 tablet by mouth at bedtime.   montelukast 10 MG tablet Commonly known as:  SINGULAIR Take 1 tablet (10 mg total) by mouth at bedtime.   predniSONE 10 MG (21) Tbpk tablet Commonly known as:  STERAPRED UNI-PAK 21 TAB Take 6 tabs first day, 5 tab on day 2, then 4 on day 3rd, 3 tabs on day 4th , 2 tab on day 5th, and 1 tab on 6th day. Replaces:  predniSONE 50 MG tablet   tiotropium 18 MCG inhalation capsule Commonly known as:  SPIRIVA Place 1 capsule (18 mcg total) into inhaler and inhale daily.   verapamil 180 MG CR tablet Commonly known as:  CALAN-SR Take 2 tablets (360 mg total) by mouth daily.       Vitals:   05/08/17 0833 05/08/17 0955  BP: 140/90 133/60  Pulse:  (!) 102  Resp:    Temp:    SpO2:      Skin clean, dry and intact without evidence of skin break down, no evidence of skin tears noted. IV catheter discontinued intact. Site without signs and symptoms of complications. Dressing and pressure applied. Pt denies pain at this time. No complaints noted.  An After Visit Summary was printed and given to the patient. Patient escorted via WC, and D/C  home via private auto.  Francesco Sor

## 2017-05-08 NOTE — Progress Notes (Signed)
[facility]  ROUNDING NOTE   Subjective:  Patient seen at bedside. Currently sitting up in chair this a.m. Overall feeling much better as compared to admission.   Objective:  Vital signs in last 24 hours:  Temp:  [97.5 F (36.4 C)-98.4 F (36.9 C)] 97.5 F (36.4 C) (08/12 0814) Pulse Rate:  [76-107] 102 (08/12 0955) Resp:  [17-28] 17 (08/12 0814) BP: (133-178)/(58-122) 133/60 (08/12 0955) SpO2:  [90 %-96 %] 96 % (08/12 0814) Weight:  [85.9 kg (189 lb 6.4 oz)] 85.9 kg (189 lb 6.4 oz) (08/12 0542)  Weight change:  Filed Weights   05/04/17 1811 05/08/17 0542  Weight: 85.7 kg (188 lb 15 oz) 85.9 kg (189 lb 6.4 oz)    Intake/Output: I/O last 3 completed shifts: In: 600 [P.O.:600] Out: 1700 [Urine:1700]   Intake/Output this shift:  No intake/output data recorded.  Physical Exam: General: No acute distress  Head: Normocephalic, atraumatic. Moist oral mucosal membranes  Eyes: Anicteric  Neck: Supple, trachea midline  Lungs:  Scattered rhonchi, Normal effort   Heart: S1S2 no rubs  Abdomen:  Soft, nontender, bowel sounds present  Extremities: Trace peripheral edema.  Neurologic: Awake, alert, following commands  Skin: No lesions       Basic Metabolic Panel:  Recent Labs Lab 05/04/17 1454 05/05/17 0026 05/07/17 0445 05/07/17 0452 05/08/17 0443  NA 146* 143 142  --  143  K 4.2 3.9 4.3  --  4.3  CL 110 108 106  --  103  CO2 23 25 27   --  28  GLUCOSE 202* 217* 204*  --  176*  BUN 29* 29* 50*  --  54*  CREATININE 1.40* 1.38* 1.44*  --  1.43*  CALCIUM 9.1 8.8* 8.8*  --  9.3  MG  --   --   --  1.7  --   PHOS  --   --   --  4.0  --     Liver Function Tests:  Recent Labs Lab 05/04/17 1454  AST 33  ALT 21  ALKPHOS 61  BILITOT 0.5  PROT 7.1  ALBUMIN 3.4*   No results for input(s): LIPASE, AMYLASE in the last 168 hours. No results for input(s): AMMONIA in the last 168 hours.  CBC:  Recent Labs Lab 05/04/17 1454 05/05/17 0026  05/07/17 0445 05/08/17 0443  WBC 14.0* 10.8 16.7* 22.2*  NEUTROABS 8.4*  --   --   --   HGB 11.2* 10.6* 9.8* 11.8*  HCT 34.9* 31.6* 29.6* 35.5  MCV 90.5 89.1 87.7 89.1  PLT 342 271 288 401    Cardiac Enzymes:  Recent Labs Lab 05/04/17 1454 05/04/17 1842 05/05/17 0026 05/05/17 0608  TROPONINI 0.10* 0.19* 0.21* 0.20*    BNP: Invalid input(s): POCBNP  CBG:  Recent Labs Lab 05/07/17 0732 05/07/17 1140 05/07/17 1624 05/07/17 2124 05/08/17 0739  GLUCAP 211* 141* 304* 169* 151*    Microbiology: Results for orders placed or performed during the hospital encounter of 05/04/17  Culture, blood (Routine X 2) w Reflex to ID Panel     Status: None (Preliminary result)   Collection Time: 05/04/17  2:54 PM  Result Value Ref Range Status   Specimen Description BLOOD RIGHT ANTECUBITAL  Final   Special Requests   Final    BOTTLES DRAWN AEROBIC AND ANAEROBIC Blood Culture adequate volume   Culture NO GROWTH 4 DAYS  Final   Report Status PENDING  Incomplete  Culture, blood (Routine X 2) w Reflex to ID Panel  Status: None (Preliminary result)   Collection Time: 05/04/17  2:54 PM  Result Value Ref Range Status   Specimen Description BLOOD LEFT ANTECUBITAL  Final   Special Requests   Final    BOTTLES DRAWN AEROBIC AND ANAEROBIC Blood Culture adequate volume   Culture NO GROWTH 4 DAYS  Final   Report Status PENDING  Incomplete  MRSA PCR Screening     Status: None   Collection Time: 05/04/17  6:14 PM  Result Value Ref Range Status   MRSA by PCR NEGATIVE NEGATIVE Final    Comment:        The GeneXpert MRSA Assay (FDA approved for NASAL specimens only), is one component of a comprehensive MRSA colonization surveillance program. It is not intended to diagnose MRSA infection nor to guide or monitor treatment for MRSA infections.   Culture, expectorated sputum-assessment     Status: None   Collection Time: 05/04/17  6:59 PM  Result Value Ref Range Status   Specimen  Description EXPECTORATED SPUTUM  Final   Special Requests NONE  Final   Sputum evaluation   Final    Sputum specimen not acceptable for testing.  Please recollect.     Report Status 05/04/2017 FINAL  Final    Coagulation Studies: No results for input(s): LABPROT, INR in the last 72 hours.  Urinalysis: No results for input(s): COLORURINE, LABSPEC, PHURINE, GLUCOSEU, HGBUR, BILIRUBINUR, KETONESUR, PROTEINUR, UROBILINOGEN, NITRITE, LEUKOCYTESUR in the last 72 hours.  Invalid input(s): APPERANCEUR    Imaging: Dg Chest Port 1 View  Result Date: 05/07/2017 CLINICAL DATA:  Congestive heart failure. EXAM: PORTABLE CHEST 1 VIEW COMPARISON:  One-view chest x-ray 05/06/2017 FINDINGS: The heart is enlarged. Mild edema is stable. Bibasilar airspace disease likely reflects atelectasis without significant interval change. IMPRESSION: 1. Stable cardiac enlargement and mild pulmonary edema. 2. Stable bibasilar airspace disease, likely atelectasis. Electronically Signed   By: San Morelle M.D.   On: 05/07/2017 08:03     Medications:    . aspirin EC  81 mg Oral Daily  . atorvastatin  40 mg Oral q1800  . azithromycin  250 mg Oral Daily  . budesonide (PULMICORT) nebulizer solution  0.5 mg Nebulization BID  . enoxaparin (LOVENOX) injection  40 mg Subcutaneous Q24H  . ferrous sulfate  325 mg Oral BID WC  . furosemide  40 mg Oral Daily  . hydrALAZINE  25 mg Oral Q8H  . insulin aspart  0-5 Units Subcutaneous QHS  . insulin aspart  0-9 Units Subcutaneous TID WC  . ipratropium-albuterol  3 mL Nebulization TID  . lactulose  30 g Oral Daily  . loratadine  10 mg Oral Daily  . mouth rinse  15 mL Mouth Rinse BID  . methylPREDNISolone (SOLU-MEDROL) injection  125 mg Intravenous Once  . methylPREDNISolone (SOLU-MEDROL) injection  40 mg Intravenous Q12H  . metoprolol tartrate  12.5 mg Oral BID  . mirtazapine  45 mg Oral QHS  . montelukast  10 mg Oral QHS  . multivitamin-lutein  1 capsule Oral Daily   . verapamil  180 mg Oral Daily   acetaminophen **OR** acetaminophen, ALPRAZolam, guaiFENesin, ipratropium-albuterol, metoprolol tartrate  Assessment/ Plan:  76 y.o. female with a PMHx of Asthma, congestive heart failure EF 45-50%, hypertension, hyperlipidemia, morbid obesity, diabetes mellitus type 2, chronic kidney disease stage III, who was admitted to [facility] on 05/04/2017 for evaluation of shortness of breath.  1. Acute renal failure/chronic kidney disease stage III Baseline creatinine 1.0 with an EGFR 55. Recently the patient has  been taking Advil for bilateral knee pain. She also has risk factors for chronic kidney disease including hypertension and diabetes mellitus type 2. -  renal function overall appears to be stable. BUN is high but this is secondary to steroids. Patient will need follow-up in our office and has an appointment for this on August 22.  2. Acute respiratory failure.  Patient not requiring any oxygen at the moment.  Continue the patient on Solu-Medrol, nebulizer treatments, and inhalers.  3.  Anemia of chronic kidney disease:  Hemoglobin now up to 11.8. Continue the patient on ferrous sulfate.   LOS: 4 Angela Pham 8/12/201810:22 AM

## 2017-05-09 LAB — CULTURE, BLOOD (ROUTINE X 2)
Culture: NO GROWTH
Culture: NO GROWTH
SPECIAL REQUESTS: ADEQUATE
SPECIAL REQUESTS: ADEQUATE

## 2017-05-09 LAB — PROTEIN ELECTRO, RANDOM URINE
ALBUMIN ELP UR: 76.3 %
ALPHA-2-GLOBULIN, U: 8.7 %
Alpha-1-Globulin, U: 0.9 %
BETA GLOBULIN, U: 4.8 %
GAMMA GLOBULIN, U: 9.3 %
TOTAL PROTEIN, URINE-UPE24: 143.3 mg/dL

## 2017-05-17 ENCOUNTER — Ambulatory Visit: Payer: Medicare Other | Admitting: Family

## 2017-05-28 ENCOUNTER — Emergency Department: Payer: Medicare Other

## 2017-05-28 ENCOUNTER — Encounter: Payer: Self-pay | Admitting: Emergency Medicine

## 2017-05-28 ENCOUNTER — Inpatient Hospital Stay
Admission: EM | Admit: 2017-05-28 | Discharge: 2017-06-02 | DRG: 190 | Disposition: A | Discharge status: Home / Self Care | Payer: Medicare Other | Attending: Internal Medicine | Admitting: Internal Medicine

## 2017-05-28 DIAGNOSIS — I248 Other forms of acute ischemic heart disease: Secondary | ICD-10-CM | POA: Diagnosis present

## 2017-05-28 DIAGNOSIS — Z7952 Long term (current) use of systemic steroids: Secondary | ICD-10-CM | POA: Diagnosis not present

## 2017-05-28 DIAGNOSIS — Z7984 Long term (current) use of oral hypoglycemic drugs: Secondary | ICD-10-CM | POA: Diagnosis not present

## 2017-05-28 DIAGNOSIS — N179 Acute kidney failure, unspecified: Secondary | ICD-10-CM | POA: Diagnosis present

## 2017-05-28 DIAGNOSIS — J441 Chronic obstructive pulmonary disease with (acute) exacerbation: Principal | ICD-10-CM

## 2017-05-28 DIAGNOSIS — R0902 Hypoxemia: Secondary | ICD-10-CM

## 2017-05-28 DIAGNOSIS — D649 Anemia, unspecified: Secondary | ICD-10-CM | POA: Diagnosis present

## 2017-05-28 DIAGNOSIS — Z7982 Long term (current) use of aspirin: Secondary | ICD-10-CM

## 2017-05-28 DIAGNOSIS — J9621 Acute and chronic respiratory failure with hypoxia: Secondary | ICD-10-CM | POA: Diagnosis present

## 2017-05-28 DIAGNOSIS — I5023 Acute on chronic systolic (congestive) heart failure: Secondary | ICD-10-CM | POA: Diagnosis not present

## 2017-05-28 DIAGNOSIS — T502X5A Adverse effect of carbonic-anhydrase inhibitors, benzothiadiazides and other diuretics, initial encounter: Secondary | ICD-10-CM | POA: Diagnosis present

## 2017-05-28 DIAGNOSIS — E785 Hyperlipidemia, unspecified: Secondary | ICD-10-CM | POA: Diagnosis present

## 2017-05-28 DIAGNOSIS — Z888 Allergy status to other drugs, medicaments and biological substances status: Secondary | ICD-10-CM | POA: Diagnosis not present

## 2017-05-28 DIAGNOSIS — Z91013 Allergy to seafood: Secondary | ICD-10-CM

## 2017-05-28 DIAGNOSIS — I509 Heart failure, unspecified: Secondary | ICD-10-CM | POA: Diagnosis present

## 2017-05-28 DIAGNOSIS — J45901 Unspecified asthma with (acute) exacerbation: Secondary | ICD-10-CM | POA: Diagnosis present

## 2017-05-28 DIAGNOSIS — I110 Hypertensive heart disease with heart failure: Secondary | ICD-10-CM | POA: Diagnosis present

## 2017-05-28 DIAGNOSIS — R0602 Shortness of breath: Secondary | ICD-10-CM | POA: Diagnosis not present

## 2017-05-28 DIAGNOSIS — Z6836 Body mass index (BMI) 36.0-36.9, adult: Secondary | ICD-10-CM | POA: Diagnosis not present

## 2017-05-28 DIAGNOSIS — E119 Type 2 diabetes mellitus without complications: Secondary | ICD-10-CM | POA: Diagnosis present

## 2017-05-28 DIAGNOSIS — Z79899 Other long term (current) drug therapy: Secondary | ICD-10-CM

## 2017-05-28 LAB — BASIC METABOLIC PANEL
ANION GAP: 13 (ref 5–15)
BUN: 32 mg/dL — AB (ref 6–20)
CALCIUM: 8.8 mg/dL — AB (ref 8.9–10.3)
CO2: 24 mmol/L (ref 22–32)
Chloride: 105 mmol/L (ref 101–111)
Creatinine, Ser: 1.53 mg/dL — ABNORMAL HIGH (ref 0.44–1.00)
GFR calc Af Amer: 38 mL/min — ABNORMAL LOW (ref >60)
GFR, EST NON AFRICAN AMERICAN: 33 mL/min — AB (ref >60)
GLUCOSE: 118 mg/dL — AB (ref 65–99)
POTASSIUM: 4 mmol/L (ref 3.5–5.1)
SODIUM: 142 mmol/L (ref 135–145)

## 2017-05-28 LAB — TROPONIN I
TROPONIN I: 0.07 ng/mL — AB (ref <0.03)
TROPONIN I: 0.07 ng/mL — AB (ref <0.03)

## 2017-05-28 LAB — CBC
HCT: 33.2 % — ABNORMAL LOW (ref 35.0–47.0)
Hemoglobin: 11.2 g/dL — ABNORMAL LOW (ref 12.0–16.0)
MCH: 29.5 pg (ref 26.0–34.0)
MCHC: 33.6 g/dL (ref 32.0–36.0)
MCV: 87.7 fL (ref 80.0–100.0)
Platelets: 295 10*3/uL (ref 150–440)
RBC: 3.78 MIL/uL — ABNORMAL LOW (ref 3.80–5.20)
RDW: 16.1 % — AB (ref 11.5–14.5)
WBC: 10.3 10*3/uL (ref 3.6–11.0)

## 2017-05-28 MED ORDER — DEXTROMETHORPHAN POLISTIREX ER 30 MG/5ML PO SUER
30.0000 mg | Freq: Two times a day (BID) | ORAL | Status: DC
  Administered 2017-05-29 – 2017-06-02 (×10): 30 mg via ORAL
  Filled 2017-05-28 (×11): qty 5

## 2017-05-28 MED ORDER — MONTELUKAST SODIUM 10 MG PO TABS
10.0000 mg | ORAL_TABLET | Freq: Every day | ORAL | Status: DC
  Administered 2017-05-29 – 2017-06-01 (×5): 10 mg via ORAL
  Filled 2017-05-28 (×5): qty 1

## 2017-05-28 MED ORDER — FUROSEMIDE 10 MG/ML IJ SOLN
40.0000 mg | Freq: Once | INTRAMUSCULAR | Status: AC
  Administered 2017-05-28: 40 mg via INTRAVENOUS
  Filled 2017-05-28: qty 4

## 2017-05-28 MED ORDER — ONDANSETRON HCL 4 MG/2ML IJ SOLN: 4.0000 mg | Freq: Four times a day (QID) | INTRAMUSCULAR | Status: DC | PRN

## 2017-05-28 MED ORDER — DM-GUAIFENESIN ER 30-600 MG PO TB12: 1.0000 | ORAL_TABLET | Freq: Two times a day (BID) | ORAL | Status: DC

## 2017-05-28 MED ORDER — OXYCODONE HCL 5 MG PO TABS: 5.0000 mg | ORAL_TABLET | ORAL | Status: DC | PRN

## 2017-05-28 MED ORDER — IOPAMIDOL (ISOVUE-370) INJECTION 76%
60.0000 mL | Freq: Once | INTRAVENOUS | Status: AC | PRN
  Administered 2017-05-28: 60 mL via INTRAVENOUS

## 2017-05-28 MED ORDER — ALBUTEROL SULFATE (2.5 MG/3ML) 0.083% IN NEBU
5.0000 mg | INHALATION_SOLUTION | Freq: Once | RESPIRATORY_TRACT | Status: AC
  Administered 2017-05-28: 5 mg via RESPIRATORY_TRACT
  Filled 2017-05-28: qty 6

## 2017-05-28 MED ORDER — IPRATROPIUM-ALBUTEROL 0.5-2.5 (3) MG/3ML IN SOLN
3.0000 mL | Freq: Once | RESPIRATORY_TRACT | Status: AC
  Administered 2017-05-28: 3 mL via RESPIRATORY_TRACT
  Filled 2017-05-28: qty 3

## 2017-05-28 MED ORDER — HYDRALAZINE HCL 50 MG PO TABS
50.0000 mg | ORAL_TABLET | Freq: Every day | ORAL | Status: DC
  Administered 2017-05-29 – 2017-06-02 (×5): 50 mg via ORAL
  Filled 2017-05-28 (×5): qty 1

## 2017-05-28 MED ORDER — ONDANSETRON HCL 4 MG PO TABS: 4.0000 mg | ORAL_TABLET | Freq: Four times a day (QID) | ORAL | Status: DC | PRN

## 2017-05-28 MED ORDER — LORATADINE 10 MG PO TABS
10.0000 mg | ORAL_TABLET | Freq: Every day | ORAL | Status: DC
  Administered 2017-05-29 – 2017-06-02 (×5): 10 mg via ORAL
  Filled 2017-05-28 (×5): qty 1

## 2017-05-28 MED ORDER — SODIUM CHLORIDE 0.9% FLUSH
3.0000 mL | Freq: Two times a day (BID) | INTRAVENOUS | Status: DC
  Administered 2017-05-29 (×3): 3 mL via INTRAVENOUS
  Administered 2017-05-30: 03:00:00 via INTRAVENOUS
  Administered 2017-05-30 – 2017-06-02 (×6): 3 mL via INTRAVENOUS

## 2017-05-28 MED ORDER — SODIUM CHLORIDE 0.9 % IV SOLN: 250.0000 mL | INTRAVENOUS | Status: DC | PRN

## 2017-05-28 MED ORDER — ENOXAPARIN SODIUM 40 MG/0.4ML ~~LOC~~ SOLN
40.0000 mg | SUBCUTANEOUS | Status: DC
  Administered 2017-05-29: 40 mg via SUBCUTANEOUS
  Filled 2017-05-28: qty 0.4

## 2017-05-28 MED ORDER — GUAIFENESIN ER 600 MG PO TB12
600.0000 mg | ORAL_TABLET | Freq: Two times a day (BID) | ORAL | Status: DC
  Administered 2017-05-29 – 2017-06-02 (×10): 600 mg via ORAL
  Filled 2017-05-28 (×10): qty 1

## 2017-05-28 MED ORDER — METHYLPREDNISOLONE SODIUM SUCC 125 MG IJ SOLR
60.0000 mg | Freq: Four times a day (QID) | INTRAMUSCULAR | Status: DC
  Administered 2017-05-29 – 2017-06-01 (×14): 60 mg via INTRAVENOUS
  Filled 2017-05-28 (×15): qty 2

## 2017-05-28 MED ORDER — FUROSEMIDE 20 MG PO TABS
40.0000 mg | ORAL_TABLET | Freq: Every day | ORAL | Status: DC
  Administered 2017-05-29: 40 mg via ORAL
  Filled 2017-05-28: qty 1

## 2017-05-28 MED ORDER — ACETAMINOPHEN 325 MG PO TABS
650.0000 mg | ORAL_TABLET | Freq: Four times a day (QID) | ORAL | Status: DC | PRN
Start: 2017-05-28 — End: 2017-06-02

## 2017-05-28 MED ORDER — ACETAMINOPHEN 650 MG RE SUPP: 650.0000 mg | Freq: Four times a day (QID) | RECTAL | Status: DC | PRN

## 2017-05-28 MED ORDER — IOPAMIDOL (ISOVUE-300) INJECTION 61%: 60.0000 mL | Freq: Once | INTRAVENOUS | Status: DC | PRN

## 2017-05-28 MED ORDER — LEVALBUTEROL HCL 1.25 MG/3ML IN NEBU
1.2500 mg | INHALATION_SOLUTION | Freq: Four times a day (QID) | RESPIRATORY_TRACT | Status: DC
  Administered 2017-05-28: 1.25 mg via RESPIRATORY_TRACT
  Filled 2017-05-28 (×5): qty 3

## 2017-05-28 MED ORDER — FERROUS SULFATE 325 (65 FE) MG PO TABS
325.0000 mg | ORAL_TABLET | Freq: Two times a day (BID) | ORAL | Status: DC
  Administered 2017-05-31: 325 mg via ORAL
  Filled 2017-05-28 (×6): qty 1

## 2017-05-28 MED ORDER — LEVOCETIRIZINE DIHYDROCHLORIDE 5 MG PO TABS: 10.0000 mg | ORAL_TABLET | Freq: Every day | ORAL | Status: DC

## 2017-05-28 MED ORDER — PREDNISONE 20 MG PO TABS
60.0000 mg | ORAL_TABLET | Freq: Once | ORAL | Status: AC
  Administered 2017-05-28: 60 mg via ORAL
  Filled 2017-05-28: qty 3

## 2017-05-28 MED ORDER — IPRATROPIUM BROMIDE 0.02 % IN SOLN: 0.5000 mg | Freq: Four times a day (QID) | RESPIRATORY_TRACT | Status: DC | PRN

## 2017-05-28 MED ORDER — MIRTAZAPINE 15 MG PO TABS
45.0000 mg | ORAL_TABLET | Freq: Every day | ORAL | Status: DC
  Administered 2017-05-29 – 2017-06-01 (×5): 45 mg via ORAL
  Filled 2017-05-28 (×5): qty 3

## 2017-05-28 MED ORDER — UMECLIDINIUM BROMIDE 62.5 MCG/INH IN AEPB
1.0000 | INHALATION_SPRAY | Freq: Every day | RESPIRATORY_TRACT | Status: DC
  Administered 2017-05-29 – 2017-06-02 (×5): 1 via RESPIRATORY_TRACT
  Filled 2017-05-28 (×2): qty 7

## 2017-05-28 MED ORDER — ALBUTEROL SULFATE (2.5 MG/3ML) 0.083% IN NEBU
2.5000 mg | INHALATION_SOLUTION | Freq: Four times a day (QID) | RESPIRATORY_TRACT | Status: DC | PRN
  Administered 2017-05-29: 2.5 mg via RESPIRATORY_TRACT
  Filled 2017-05-28: qty 3

## 2017-05-28 MED ORDER — ALBUTEROL SULFATE (2.5 MG/3ML) 0.083% IN NEBU
INHALATION_SOLUTION | RESPIRATORY_TRACT | Status: AC
  Filled 2017-05-28: qty 3

## 2017-05-28 MED ORDER — SODIUM CHLORIDE 0.9% FLUSH: 3.0000 mL | INTRAVENOUS | Status: DC | PRN

## 2017-05-28 MED ORDER — LACTULOSE 10 GM/15ML PO SOLN
30.0000 g | ORAL | Status: DC
  Administered 2017-05-30: 20 g via ORAL
  Administered 2017-06-01: 09:00:00 30 g via ORAL
  Filled 2017-05-28 (×2): qty 60

## 2017-05-28 MED ORDER — MAGNESIUM CITRATE PO SOLN
1.0000 | Freq: Once | ORAL | Status: AC | PRN
  Administered 2017-06-01: 1 via ORAL
  Filled 2017-05-28 (×3): qty 296

## 2017-05-28 MED ORDER — SODIUM CHLORIDE 0.9 % IV BOLUS (SEPSIS): 500.0000 mL | Freq: Once | INTRAVENOUS | Status: DC

## 2017-05-28 MED ORDER — BISACODYL 5 MG PO TBEC: 5.0000 mg | DELAYED_RELEASE_TABLET | Freq: Every day | ORAL | Status: DC | PRN

## 2017-05-28 MED ORDER — ASPIRIN EC 81 MG PO TBEC
81.0000 mg | DELAYED_RELEASE_TABLET | Freq: Every day | ORAL | Status: DC
  Administered 2017-05-29 – 2017-06-02 (×5): 81 mg via ORAL
  Filled 2017-05-28 (×5): qty 1

## 2017-05-28 MED ORDER — SENNOSIDES-DOCUSATE SODIUM 8.6-50 MG PO TABS
1.0000 | ORAL_TABLET | Freq: Every evening | ORAL | Status: DC | PRN
  Administered 2017-05-29: 1 via ORAL
  Filled 2017-05-28: qty 1

## 2017-05-28 MED ORDER — ATORVASTATIN CALCIUM 20 MG PO TABS
40.0000 mg | ORAL_TABLET | Freq: Every day | ORAL | Status: DC
  Filled 2017-05-28: qty 2

## 2017-05-28 NOTE — ED Notes (Signed)
Date and time results received: 05/28/17 1614  Test: Troponin Critical Value: 0.07  Name of Provider Notified: Joni Fears  Orders Received? Or Actions Taken?: None at this time.

## 2017-05-28 NOTE — ED Notes (Signed)
Unsuccessful IV start x2, asking Brandy RN to attempt after patient returns from XR

## 2017-05-28 NOTE — H&P (Signed)
History and Physical   [facility] - [facility] Admission History and Physical [employer], D.O.    Patient Name: Angela Pham MR#: [id number] Date of Birth: 19-Jul-1941 Date of Admission: 05/28/2017  Referring MD/NP/PA: Dr. Joni Fears Primary Care Physician: [facility], [facility]  Chief Complaint:  Chief Complaint  Patient presents with  . Shortness of Breath    HPI: Yalexa Blust is a 76 y.o. female with a known history of asthma, CHF, hypertension, hyperlipidemia, diabetes presents to the emergency department for evaluation of shortness of breath.  Patient was in a usual state of health until about one month ago when she describes the onset of shortness of breath which has progressively worsened. She did have some associated chest pain,worse with inspiration as well as shortness of breath and worsening dyspnea on exertion for the past 2 days. Her symptoms are refractory to home bronchodilators.  Of note she was admitted to the hospital for an asthma exacerbation as well as pneumonia about 3 weeks ago..  Patient denies fevers/chills, weakness, dizziness, N/V/C/D, abdominal pain, dysuria/frequency, changes in mental status.    Otherwise there has been no change in status. Patient has been taking medication as prescribed and there has been no recent change in medication or diet.  No recent antibiotics.  There has been no recent travel or sick contacts.    EMS/ED Course: Patient received normal saline, prednisone, DuoNeb's, albuterol., Xopenex,Lasix Medical admission has been requested for further management of asthma exacerbation.  Review of Systems:  CONSTITUTIONAL: No fever/chills, fatigue, weakness, weight gain/loss, headache. EYES: No blurry or double vision. ENT: No tinnitus, postnasal drip, redness or soreness of the oropharynx. RESPIRATORY: positive cough, dyspnea, wheeze.  No hemoptysis.  CARDIOVASCULAR: positive chest pain, negative palpitations,  syncope, orthopnea. No lower extremity edema.  GASTROINTESTINAL: No nausea, vomiting, abdominal pain, diarrhea, constipation.  No hematemesis, melena or hematochezia. GENITOURINARY: No dysuria, frequency, hematuria. ENDOCRINE: No polyuria or nocturia. No heat or cold intolerance. HEMATOLOGY: No anemia, bruising, bleeding. INTEGUMENTARY: No rashes, ulcers, lesions. MUSCULOSKELETAL: No arthritis, gout, dyspnea. NEUROLOGIC: No numbness, tingling, ataxia, seizure-type activity, weakness. PSYCHIATRIC: No anxiety, depression, insomnia.   Past Medical History:  Diagnosis Date  . Asthma   . CHF (congestive heart failure) ([place])   . Hyperlipidemia   . Hypertension   . Morbid obesity ([place])   . Type II diabetes mellitus ([place])     Past Surgical History:  Procedure Laterality Date  . c-section    . RIGHT/LEFT HEART CATH AND CORONARY ANGIOGRAPHY N/A 03/09/2017   Procedure: Right/Left Heart Cath and Coronary Angiography;  Surgeon: Minna Merritts, MD;  Location: [facility];  Service: Cardiovascular;  Laterality: N/A;     reports that she has never smoked. She has never used smokeless tobacco. She reports that she does not drink alcohol or use drugs.  Allergies  Allergen Reactions  . Ace Inhibitors Anaphylaxis    Tongue swelling  . Shellfish Allergy Hives and Swelling    Swelling around face and mouth     Family History  Problem Relation Age of Onset  . CAD Father   . Colon cancer Sister   . Leukemia Brother   . Throat cancer Brother   . Cervical cancer Sister     Prior to Admission medications   Medication Sig Start Date End Date Taking? Authorizing Provider  aspirin EC 81 MG EC tablet Take 1 tablet (81 mg total) by mouth daily. 03/12/17  Yes [name], MD  atorvastatin (LIPITOR)  40 MG tablet Take 40 mg by mouth daily.   Yes [provider]  ferrous sulfate 325 (65 FE) MG tablet Take 1 tablet (325 mg total) by mouth 2 (two) times daily with a meal. 05/08/17   Yes Vaughan Basta, MD  furosemide (LASIX) 40 MG tablet Take 1 tablet (40 mg total) by mouth daily. 03/13/17  Yes [name], MD  hydrALAZINE (APRESOLINE) 25 MG tablet Take 1 tablet (25 mg total) by mouth every 8 (eight) hours. Patient taking differently: Take 50 mg by mouth daily.  05/08/17  Yes Vaughan Basta, MD  INCRUSE ELLIPTA 62.5 MCG/INH AEPB Inhale 1 puff into the lungs daily. 03/16/17  Yes [provider]  Ipratropium-Albuterol (COMBIVENT IN) Inhale 3 puffs into the lungs every 4 (four) hours as needed.   Yes [provider]  ipratropium-albuterol (DUONEB) 0.5-2.5 (3) MG/3ML SOLN Inhale 3 mLs into the lungs every 4 (four) hours as needed.   Yes [provider]  lactulose (CHRONULAC) 10 GM/15ML solution Take 30 g by mouth daily.   Yes [provider]  levocetirizine (XYZAL) 5 MG tablet Take 10 mg by mouth daily. 03/18/17  Yes [provider]  metFORMIN (GLUCOPHAGE) 850 MG tablet Take 1 tablet by mouth 2 (two) times daily.   Yes [provider]  mirtazapine (REMERON) 45 MG tablet Take 1 tablet by mouth at bedtime.   Yes [provider]  montelukast (SINGULAIR) 10 MG tablet Take 1 tablet (10 mg total) by mouth at bedtime. 03/11/17  Yes Mody, Ulice Bold, MD  Multiple Vitamins-Minerals (EYE VITAMINS PO) Take 1 tablet by mouth daily.   Yes [provider]  predniSONE (STERAPRED UNI-PAK 21 TAB) 10 MG (21) TBPK tablet Take 6 tabs first day, 5 tab on day 2, then 4 on day 3rd, 3 tabs on day 4th , 2 tab on day 5th, and 1 tab on 6th day. Patient not taking: Reported on 05/28/2017 05/08/17   Vaughan Basta, MD  tiotropium (SPIRIVA) 18 MCG inhalation capsule Place 1 capsule (18 mcg total) into inhaler and inhale daily. Patient not taking: Reported on 05/04/2017 03/12/17   [name], MD  verapamil (CALAN-SR) 180 MG CR tablet Take 2 tablets (360 mg total) by mouth daily. Patient not taking: Reported on 05/28/2017 03/12/17  04/10/17  [name], MD    Physical Exam: Vitals:   05/28/17 1506 05/28/17 1507 05/28/17 1905  BP: (!) 180/85  (!) 184/90  Pulse: (!) 127  (!) 117  Resp: 20  (!) 23  Temp: 98 F (36.7 C)    TempSrc: Oral    SpO2: 94%  97%  Weight:  85.7 kg (189 lb)     GENERAL: 76 y.o.-year-old female patient, well-developed, well-nourished lying in the bed in no acute distress.  Pleasant and cooperative.   HEENT: Head atraumatic, normocephalic. Pupils equal. Mucus membranes moist. NECK: Supple, full range of motion. No JVD, no bruit heard. No thyroid enlargement, no tenderness, no cervical lymphadenopathy. CHEST: diffuse respiratory wheezes and diminished breath sounds at the basesNo use of accessory muscles of respiration.  No reproducible chest wall tenderness.  CARDIOVASCULAR: S1, S2 normal. No murmurs, rubs, or gallops. Cap refill <2 seconds. Pulses intact distally.  ABDOMEN: Soft, nondistended, nontender. No rebound, guarding, rigidity. Normoactive bowel sounds present in all four quadrants.  EXTREMITIES: No pedal edema, cyanosis, or clubbing. No calf tenderness or Homan's sign.  NEUROLOGIC: The patient is alert and oriented x 3. Cranial nerves II through XII are grossly intact with no focal  sensorimotor deficit. PSYCHIATRIC:  Normal affect, mood, thought content. SKIN: Warm, dry, and intact without obvious rash, lesion, or ulcer.    Labs on Admission:  CBC:  Recent Labs Lab 05/28/17 1514  WBC 10.3  HGB 11.2*  HCT 33.2*  MCV 87.7  PLT 818   Basic Metabolic Panel:  Recent Labs Lab 05/28/17 1514  NA 142  K 4.0  CL 105  CO2 24  GLUCOSE 118*  BUN 32*  CREATININE 1.53*  CALCIUM 8.8*   GFR: Estimated Creatinine Clearance: 29.7 mL/min (A) (by C-G formula based on SCr of 1.53 mg/dL (H)). Liver Function Tests: No results for input(s): AST, ALT, ALKPHOS, BILITOT, PROT, ALBUMIN in the last 168 hours. No results for input(s): LIPASE, AMYLASE in the last 168 hours. No results for  input(s): AMMONIA in the last 168 hours. Coagulation Profile: No results for input(s): INR, PROTIME in the last 168 hours. Cardiac Enzymes:  Recent Labs Lab 05/28/17 1514 05/28/17 1859  TROPONINI 0.07* 0.07*   BNP (last 3 results) No results for input(s): PROBNP in the last 8760 hours. HbA1C: No results for input(s): HGBA1C in the last 72 hours. CBG: No results for input(s): GLUCAP in the last 168 hours. Lipid Profile: No results for input(s): CHOL, HDL, LDLCALC, TRIG, CHOLHDL, LDLDIRECT in the last 72 hours. Thyroid Function Tests: No results for input(s): TSH, T4TOTAL, FREET4, T3FREE, THYROIDAB in the last 72 hours. Anemia Panel: No results for input(s): VITAMINB12, FOLATE, FERRITIN, TIBC, IRON, RETICCTPCT in the last 72 hours. Urine analysis: No results found for: COLORURINE, APPEARANCEUR, LABSPEC, PHURINE, GLUCOSEU, HGBUR, BILIRUBINUR, KETONESUR, PROTEINUR, UROBILINOGEN, NITRITE, LEUKOCYTESUR Sepsis Labs: @LABRCNTIP (procalcitonin:4,lacticidven:4) )No results found for this or any previous visit (from the past 240 hour(s)).   Radiological Exams on Admission: Dg Chest 2 View  Result Date: 05/28/2017 CLINICAL DATA:  Dyspnea EXAM: CHEST  2 VIEW COMPARISON:  05/07/2017 chest radiograph. FINDINGS: Stable cardiomediastinal silhouette with mild cardiomegaly and aortic atherosclerosis. No pneumothorax. No pleural effusion. Mild pulmonary edema. IMPRESSION: Mild congestive heart failure. Electronically Signed   By: Ilona Sorrel M.D.   On: 05/28/2017 16:33   Ct Angio Chest Pe W And/or Wo Contrast  Result Date: 05/28/2017 CLINICAL DATA:  Increasing dyspnea over the past few days with exertion in particular. History of asthma. EXAM: CT ANGIOGRAPHY CHEST WITH CONTRAST TECHNIQUE: Multidetector CT imaging of the chest was performed using the standard protocol during bolus administration of intravenous contrast. Multiplanar CT image reconstructions and MIPs were obtained to evaluate the vascular  anatomy. CONTRAST:  60 cc Isovue 370 IV COMPARISON:  05/28/2017 CXR FINDINGS: Cardiovascular: No acute pulmonary embolus. Aortic atherosclerosis with mild ectasia of the ascending aorta. Cardiomegaly without pericardial effusion or thickening. Coronary arteriosclerosis along the left main, LAD, RCA and circumflex. Mild dilatation of the main pulmonary artery to 3.4 cm consistent with a component of chronic pulmonary hypertension. Mediastinum/Nodes: No enlarged mediastinal, hilar, or axillary lymph nodes. Thyroid gland, trachea, and esophagus demonstrate no significant findings. Lungs/Pleura: Limited by respiratory motion artifacts. There is mild bronchiectasis to both lower lobes and right middle lobe with streaky atelectasis at each lung base. No pneumonic consolidation, effusion or pneumothorax. There appears to be centrilobular emphysema bilaterally. Upper Abdomen: No acute abnormality. Musculoskeletal: No chest wall abnormality. No acute or significant osseous findings. Review of the MIP images confirms the above findings. IMPRESSION: 1. Cardiomegaly with aortic atherosclerosis. 2. Mild centrilobular emphysema. 3. No pulmonary embolus. Mild dilatation of the main pulmonary artery to 3.4 cm which may reflect chronic pulmonary hypertensive  change. 4. Coronary arteriosclerosis. Aortic Atherosclerosis (ICD10-I70.0) and Emphysema (ICD10-J43.9). Electronically Signed   By: Ashley Royalty M.D.   On: 05/28/2017 17:54    EKG: Sinus tachycardia at 124 bpm with leftward axis, left bundle branch block and nonspecific ST-T wave changes.   Assessment/Plan  This is a 76 y.o. female with a history of asthma, CHF, hypertension, hyperlipidemia, diabetes now being admitted with:  #. COPD/Asthma exacerbation: -Admit to inpatient with continuous pulse oximetry. -Continue nebulizers, O2 and tapering steroids -Continue ellipta, Xyzal, Singulair -Expectorant as needed -Consider pulmonology consult if not improving.   #.  Elevated troponin, likely demand ischemia.  rule out ACS - Telemetry monitoring. - Trend troponins  #. History of CHF Continue Lasix  #. History of hypertension - Continue hydralazine, Lasix  #. History of hyperlipidemia - Continue Lipitor  #. History of anemia - Continue iron  #. History of diabetes - Continue regular insulin sliding scale old metformin  Admission status: inpatient IV Fluids: Hep-Lock Diet/Nutrition: heart healthy, carb controlled  Consults called: None DVT Px: Lovenox, SCDs and early ambulation. Code Status: Full code Disposition Plan: To home in 1-2 days  All the records are reviewed and case discussed with ED provider. Management plans discussed with the patient and/or family who express understanding and agree with plan of care.  Rakeya Glab D.O. on 05/28/2017 at 8:51 PM Between 7am to 6pm - Pager - [phone] After 6pm go to [email or web address] - [employer] [place] Office [phone] CC: Primary care physician; [facility]   05/28/2017, 8:51 PM

## 2017-05-28 NOTE — ED Triage Notes (Signed)
Pt to ed with c/o sob and asthma problems.  Pt reports her home nebulizers do not help at all.

## 2017-05-28 NOTE — ED Provider Notes (Signed)
[facility] Emergency Department Provider Note  ____________________________________________  Time seen: Approximately 4:04 PM  I have reviewed the triage vital signs and the nursing notes.   HISTORY  Chief Complaint Shortness of Breath    HPI Angela Pham is a 76 y.o. female who complains of shortness of breath pretty persistently for the past month. She was admitted to the hospital 3 weeks ago, stayed for 4 days, treated for asthma exacerbationand pneumonia, discharged on steroids. However, she feels like she failed to improve. She is been continuing to use her bronchodilators frequently, more so today. Last used about 1 PM. No relief from them. Reports some mild sharp chest pain as well and dyspnea on exertion for the last 2 days. No orthopnea or peripheral edema. Denies history of blood clots. No alleviating factors. Pain is nonradiating and mild. Shortness of breath is constant and moderate to severe in intensity.     Past Medical History:  Diagnosis Date  . Asthma   . CHF (congestive heart failure) ([place])   . Hyperlipidemia   . Hypertension   . Morbid obesity ([place])   . Type II diabetes mellitus [facility])      Patient Active Problem List   Diagnosis Date Noted  . SOB (shortness of breath)   . Acute respiratory failure with hypoxia ([place]) 05/04/2017  . Pulmonary edema 03/07/2017  . CHF (congestive heart failure) ([place]) 03/07/2017     Past Surgical History:  Procedure Laterality Date  . c-section    . RIGHT/LEFT HEART CATH AND CORONARY ANGIOGRAPHY N/A 03/09/2017   Procedure: Right/Left Heart Cath and Coronary Angiography;  Surgeon: Minna Merritts, MD;  Location: [place] [facility];  Service: Cardiovascular;  Laterality: N/A;     Prior to Admission medications   Medication Sig Start Date End Date Taking? Authorizing Provider  aspirin EC 81 MG EC tablet Take 1 tablet (81 mg total) by mouth daily. 03/12/17  Yes Mody, Ulice Bold, MD  atorvastatin  (LIPITOR) 40 MG tablet Take 40 mg by mouth daily.   Yes [provider]  ferrous sulfate 325 (65 FE) MG tablet Take 1 tablet (325 mg total) by mouth 2 (two) times daily with a meal. 05/08/17  Yes Vaughan Basta, MD  furosemide (LASIX) 40 MG tablet Take 1 tablet (40 mg total) by mouth daily. 03/13/17  Yes [name], MD  hydrALAZINE (APRESOLINE) 25 MG tablet Take 1 tablet (25 mg total) by mouth every 8 (eight) hours. Patient taking differently: Take 50 mg by mouth daily.  05/08/17  Yes Vaughan Basta, MD  INCRUSE ELLIPTA 62.5 MCG/INH AEPB Inhale 1 puff into the lungs daily. 03/16/17  Yes [provider]  Ipratropium-Albuterol (COMBIVENT IN) Inhale 3 puffs into the lungs every 4 (four) hours as needed.   Yes [provider]  ipratropium-albuterol (DUONEB) 0.5-2.5 (3) MG/3ML SOLN Inhale 3 mLs into the lungs every 4 (four) hours as needed.   Yes [provider]  lactulose (CHRONULAC) 10 GM/15ML solution Take 30 g by mouth daily.   Yes [provider]  levocetirizine (XYZAL) 5 MG tablet Take 10 mg by mouth daily. 03/18/17  Yes [provider]  metFORMIN (GLUCOPHAGE) 850 MG tablet Take 1 tablet by mouth 2 (two) times daily.   Yes [provider]  mirtazapine (REMERON) 45 MG tablet Take 1 tablet by mouth at bedtime.   Yes [provider]  montelukast (SINGULAIR) 10 MG tablet Take 1 tablet (10 mg total) by mouth at bedtime. 03/11/17  Yes  [name], MD  Multiple Vitamins-Minerals (EYE VITAMINS PO) Take 1 tablet by mouth daily.   Yes [provider]  predniSONE (STERAPRED UNI-PAK 21 TAB) 10 MG (21) TBPK tablet Take 6 tabs first day, 5 tab on day 2, then 4 on day 3rd, 3 tabs on day 4th , 2 tab on day 5th, and 1 tab on 6th day. Patient not taking: Reported on 05/28/2017 05/08/17   Vaughan Basta, MD  tiotropium (SPIRIVA) 18 MCG inhalation capsule Place 1 capsule (18 mcg total) into inhaler and inhale  daily. Patient not taking: Reported on 05/04/2017 03/12/17   [name], MD  verapamil (CALAN-SR) 180 MG CR tablet Take 2 tablets (360 mg total) by mouth daily. Patient not taking: Reported on 05/28/2017 03/12/17 04/10/17  [name], MD     Allergies Ace inhibitors and Shellfish allergy   Family History  Problem Relation Age of Onset  . CAD Father   . Colon cancer Sister   . Leukemia Brother   . Throat cancer Brother   . Cervical cancer Sister     Social History Social History  Substance Use Topics  . Smoking status: Never Smoker  . Smokeless tobacco: Never Used  . Alcohol use No    Review of Systems  Constitutional:   No fever or chills.  ENT:   No sore throat. No rhinorrhea. Cardiovascular:   positive as above chest pain without syncope. Respiratory:  positive as above shortness of breath without cough. Gastrointestinal:   Negative for abdominal pain, vomiting and diarrhea.  Musculoskeletal:   Negative for focal pain or swelling All other systems reviewed and are negative except as documented above in ROS and HPI.  ____________________________________________   PHYSICAL EXAM:  VITAL SIGNS: ED Triage Vitals  Enc Vitals Group     BP 05/28/17 1506 (!) 180/85     Pulse Rate 05/28/17 1506 (!) 127     Resp 05/28/17 1506 20     Temp 05/28/17 1506 98 F (36.7 C)     Temp Source 05/28/17 1506 Oral     SpO2 05/28/17 1506 94 %     Weight 05/28/17 1507 189 lb (85.7 kg)     Height --      Head Circumference --      Peak Flow --      Pain Score 05/28/17 1506 0     Pain Loc --      Pain Edu? --      Excl. in [place]? --     Vital signs reviewed, nursing assessments reviewed.   Constitutional:   Alert and oriented. Well appearing and in no distress. Eyes:   No scleral icterus.  EOMI. No nystagmus. No conjunctival pallor. PERRL. ENT   Head:   Normocephalic and atraumatic.   Nose:   No congestion/rhinnorhea.    Mouth/Throat:   MMM, no pharyngeal erythema. No  peritonsillar mass.    Neck:   No meningismus. Full ROM Hematological/Lymphatic/Immunilogical:   No cervical lymphadenopathy. Cardiovascular:   tachycardia heart rate 125. Symmetric bilateral radial and DP pulses.  No murmurs.  Respiratory:   tachypnea, slightly increased work of breathing. Diffuse expiratory wheezing and prolonged expiratory phase. No focal crackles. Diminished air entry in all lung fields.. Gastrointestinal:   Soft and nontender. Non distended. There is no CVA tenderness.  No rebound, rigidity, or guarding. Genitourinary:   deferred Musculoskeletal:   Normal range of motion in all extremities. No joint effusions.  No lower extremity tenderness.  No edema. Neurologic:  Normal speech and language.  Motor grossly intact. No gross focal neurologic deficits are appreciated.  Skin:    Skin is warm, dry and intact. No rash noted.  No petechiae, purpura, or bullae.  ____________________________________________    LABS (pertinent positives/negatives) (all labs ordered are listed, but only abnormal results are displayed) Labs Reviewed  BASIC METABOLIC PANEL - Abnormal; Notable for the following:       Result Value   Glucose, Bld 118 (*)    BUN 32 (*)    Creatinine, Ser 1.53 (*)    Calcium 8.8 (*)    GFR calc non Af Amer 33 (*)    GFR calc Af Amer 38 (*)    All other components within normal limits  CBC - Abnormal; Notable for the following:    RBC 3.78 (*)    Hemoglobin 11.2 (*)    HCT 33.2 (*)    RDW 16.1 (*)    All other components within normal limits  TROPONIN I - Abnormal; Notable for the following:    Troponin I 0.07 (*)    All other components within normal limits  TROPONIN I - Abnormal; Notable for the following:    Troponin I 0.07 (*)    All other components within normal limits   ____________________________________________   EKG  interpreted by me Sinus tachycardia rate 124, left axis, slightly prolonged QTC. Poor R-wave progression in anterior  precordial leads. Left bundle branch block. No acute ischemic changes.  ____________________________________________    [id number]  Dg Chest 2 View  Result Date: 05/28/2017 CLINICAL DATA:  Dyspnea EXAM: CHEST  2 VIEW COMPARISON:  05/07/2017 chest radiograph. FINDINGS: Stable cardiomediastinal silhouette with mild cardiomegaly and aortic atherosclerosis. No pneumothorax. No pleural effusion. Mild pulmonary edema. IMPRESSION: Mild congestive heart failure. Electronically Signed   By: Ilona Sorrel M.D.   On: 05/28/2017 16:33   Ct Angio Chest Pe W And/or Wo Contrast  Result Date: 05/28/2017 CLINICAL DATA:  Increasing dyspnea over the past few days with exertion in particular. History of asthma. EXAM: CT ANGIOGRAPHY CHEST WITH CONTRAST TECHNIQUE: Multidetector CT imaging of the chest was performed using the standard protocol during bolus administration of intravenous contrast. Multiplanar CT image reconstructions and MIPs were obtained to evaluate the vascular anatomy. CONTRAST:  60 cc Isovue 370 IV COMPARISON:  05/28/2017 CXR FINDINGS: Cardiovascular: No acute pulmonary embolus. Aortic atherosclerosis with mild ectasia of the ascending aorta. Cardiomegaly without pericardial effusion or thickening. Coronary arteriosclerosis along the left main, LAD, RCA and circumflex. Mild dilatation of the main pulmonary artery to 3.4 cm consistent with a component of chronic pulmonary hypertension. Mediastinum/Nodes: No enlarged mediastinal, hilar, or axillary lymph nodes. Thyroid gland, trachea, and esophagus demonstrate no significant findings. Lungs/Pleura: Limited by respiratory motion artifacts. There is mild bronchiectasis to both lower lobes and right middle lobe with streaky atelectasis at each lung base. No pneumonic consolidation, effusion or pneumothorax. There appears to be centrilobular emphysema bilaterally. Upper Abdomen: No acute abnormality. Musculoskeletal: No chest wall abnormality. No acute or  significant osseous findings. Review of the MIP images confirms the above findings. IMPRESSION: 1. Cardiomegaly with aortic atherosclerosis. 2. Mild centrilobular emphysema. 3. No pulmonary embolus. Mild dilatation of the main pulmonary artery to 3.4 cm which may reflect chronic pulmonary hypertensive change. 4. Coronary arteriosclerosis. Aortic Atherosclerosis (ICD10-I70.0) and Emphysema (ICD10-J43.9). Electronically Signed   By: Ashley Royalty M.D.   On: 05/28/2017 17:54    ____________________________________________   PROCEDURES Procedures  ____________________________________________   INITIAL IMPRESSION / ASSESSMENT  AND PLAN / ED COURSE  Pertinent labs & imaging results that were available during my care of the patient were reviewed by me and considered in my medical decision making (see chart for details).  patient presents with shortness of breath, likely asthma/COPD exacerbation, possibly CHF exacerbation versus PE. Low suspicion for pneumonia pericarditis pneumothorax. Check labs and chest x-ray, give small fluid challenge and steroids and bronchodilators. We'll review electronic medical record in the meantime, with recent hospitalization and persistent symptoms patient may need CT angiogram of the chest to evaluate for PE.  Clinical Course as of May 28 2041  Sat May 28, 2017  1613 Has baseline elevated troponin, but 3 months ago was 0.03. Unclear significance of 0.07. Will trend, will get CTA chest.  Troponin I: (!!) 0.07 [PS]  8333 CTPA negative. Will continue treating as asthma/copd exac. No evidence of new airspace infiltrate.   [PS]  2039 Still extensive wheezing and increased WOB. Continue BDs. Iv lasix, plan to hospitalize for further management.   [PS]    Clinical Course User Index [PS] Carrie Mew, MD     ____________________________________________   FINAL CLINICAL IMPRESSION(S) / ED DIAGNOSES  Final diagnoses:  SOB (shortness of breath)  COPD exacerbation  (HCC)  Acute on chronic congestive heart failure, unspecified heart failure type [facility])      New Prescriptions   No medications on file     Portions of this note were generated with dragon dictation software. Dictation errors may occur despite best attempts at proofreading.    Carrie Mew, MD 05/28/17 2042

## 2017-05-28 NOTE — ED Notes (Signed)
Patient transported to X-ray 

## 2017-05-28 NOTE — ED Notes (Signed)
Patient was explained she needs to not take her oral metformin for the next 48h and she expressed verbal understanding.

## 2017-05-29 ENCOUNTER — Inpatient Hospital Stay: Payer: Medicare Other

## 2017-05-29 DIAGNOSIS — I5023 Acute on chronic systolic (congestive) heart failure: Secondary | ICD-10-CM

## 2017-05-29 DIAGNOSIS — J9621 Acute and chronic respiratory failure with hypoxia: Secondary | ICD-10-CM

## 2017-05-29 LAB — GLUCOSE, CAPILLARY
GLUCOSE-CAPILLARY: 240 mg/dL — AB (ref 65–99)
Glucose-Capillary: 171 mg/dL — ABNORMAL HIGH (ref 65–99)
Glucose-Capillary: 260 mg/dL — ABNORMAL HIGH (ref 65–99)

## 2017-05-29 LAB — BLOOD GAS, ARTERIAL
Acid-Base Excess: 1.8 mmol/L (ref 0.0–2.0)
Bicarbonate: 27.2 mmol/L (ref 20.0–28.0)
Delivery systems: POSITIVE
Expiratory PAP: 6
FIO2: 1
INSPIRATORY PAP: 16
Mechanical Rate: 12
O2 SAT: 99.9 %
PCO2 ART: 45 mmHg (ref 32.0–48.0)
PO2 ART: 329 mmHg — AB (ref 83.0–108.0)
Patient temperature: 37
pH, Arterial: 7.39 (ref 7.350–7.450)

## 2017-05-29 LAB — BASIC METABOLIC PANEL
Anion gap: 11 (ref 5–15)
BUN: 35 mg/dL — AB (ref 6–20)
CO2: 25 mmol/L (ref 22–32)
Calcium: 8 mg/dL — ABNORMAL LOW (ref 8.9–10.3)
Chloride: 104 mmol/L (ref 101–111)
Creatinine, Ser: 1.49 mg/dL — ABNORMAL HIGH (ref 0.44–1.00)
GFR, EST AFRICAN AMERICAN: 39 mL/min — AB (ref >60)
GFR, EST NON AFRICAN AMERICAN: 34 mL/min — AB (ref >60)
GLUCOSE: 198 mg/dL — AB (ref 65–99)
Potassium: 3.8 mmol/L (ref 3.5–5.1)
Sodium: 140 mmol/L (ref 135–145)

## 2017-05-29 LAB — CBC
HEMATOCRIT: 32.8 % — AB (ref 35.0–47.0)
Hemoglobin: 11 g/dL — ABNORMAL LOW (ref 12.0–16.0)
MCH: 29.8 pg (ref 26.0–34.0)
MCHC: 33.4 g/dL (ref 32.0–36.0)
MCV: 89.2 fL (ref 80.0–100.0)
Platelets: 287 10*3/uL (ref 150–440)
RBC: 3.68 MIL/uL — ABNORMAL LOW (ref 3.80–5.20)
RDW: 16.3 % — AB (ref 11.5–14.5)
WBC: 8.4 10*3/uL (ref 3.6–11.0)

## 2017-05-29 LAB — BRAIN NATRIURETIC PEPTIDE: B Natriuretic Peptide: 701 pg/mL — ABNORMAL HIGH (ref 0.0–100.0)

## 2017-05-29 LAB — TROPONIN I
TROPONIN I: 0.06 ng/mL — AB (ref <0.03)
Troponin I: 0.05 ng/mL (ref <0.03)
Troponin I: 0.05 ng/mL (ref <0.03)

## 2017-05-29 LAB — MAGNESIUM: MAGNESIUM: 1.2 mg/dL — AB (ref 1.7–2.4)

## 2017-05-29 LAB — PHOSPHORUS: PHOSPHORUS: 2.8 mg/dL (ref 2.5–4.6)

## 2017-05-29 MED ORDER — INSULIN ASPART 100 UNIT/ML ~~LOC~~ SOLN
0.0000 [IU] | Freq: Three times a day (TID) | SUBCUTANEOUS | Status: DC
  Administered 2017-05-29 – 2017-05-30 (×4): 3 [IU] via SUBCUTANEOUS
  Administered 2017-05-30: 1 [IU] via SUBCUTANEOUS
  Administered 2017-05-31 (×2): 2 [IU] via SUBCUTANEOUS
  Administered 2017-05-31: 12:00:00 3 [IU] via SUBCUTANEOUS
  Administered 2017-06-01: 2 [IU] via SUBCUTANEOUS
  Administered 2017-06-01: 5 [IU] via SUBCUTANEOUS
  Administered 2017-06-01: 4 [IU] via SUBCUTANEOUS
  Administered 2017-06-02: 3 [IU] via SUBCUTANEOUS
  Administered 2017-06-02: 09:00:00 5 [IU] via SUBCUTANEOUS
  Administered 2017-06-02: 2 [IU] via SUBCUTANEOUS
  Filled 2017-05-29 (×14): qty 1

## 2017-05-29 MED ORDER — FAMOTIDINE IN NACL 20-0.9 MG/50ML-% IV SOLN
20.0000 mg | INTRAVENOUS | Status: DC
  Administered 2017-05-29: 20 mg via INTRAVENOUS
  Filled 2017-05-29: qty 50

## 2017-05-29 MED ORDER — INSULIN ASPART 100 UNIT/ML ~~LOC~~ SOLN
0.0000 [IU] | Freq: Every day | SUBCUTANEOUS | Status: DC
  Administered 2017-05-29: 3 [IU] via SUBCUTANEOUS
  Administered 2017-05-30 – 2017-05-31 (×2): 2 [IU] via SUBCUTANEOUS
  Administered 2017-06-01: 4 [IU] via SUBCUTANEOUS
  Filled 2017-05-29 (×4): qty 1

## 2017-05-29 MED ORDER — LORAZEPAM 2 MG/ML IJ SOLN
INTRAMUSCULAR | Status: AC
  Filled 2017-05-29: qty 1

## 2017-05-29 MED ORDER — FUROSEMIDE 10 MG/ML IJ SOLN
40.0000 mg | Freq: Two times a day (BID) | INTRAMUSCULAR | Status: DC
  Administered 2017-05-29 – 2017-06-01 (×6): 40 mg via INTRAVENOUS
  Filled 2017-05-29 (×6): qty 4

## 2017-05-29 MED ORDER — ATORVASTATIN CALCIUM 20 MG PO TABS
40.0000 mg | ORAL_TABLET | Freq: Every day | ORAL | Status: DC
  Administered 2017-05-29 – 2017-06-02 (×5): 40 mg via ORAL
  Filled 2017-05-29 (×5): qty 2

## 2017-05-29 MED ORDER — LORAZEPAM 2 MG/ML IJ SOLN
0.5000 mg | Freq: Once | INTRAMUSCULAR | Status: AC
  Administered 2017-05-29: 11:00:00 0.5 mg via INTRAVENOUS

## 2017-05-29 MED ORDER — MAGNESIUM SULFATE 2 GM/50ML IV SOLN
2.0000 g | Freq: Once | INTRAVENOUS | Status: AC
Start: 2017-05-29 — End: 2017-05-29
  Administered 2017-05-29: 11:00:00 2 g via INTRAVENOUS
  Filled 2017-05-29: qty 50

## 2017-05-29 MED ORDER — ALPRAZOLAM 0.5 MG PO TABS
0.2500 mg | ORAL_TABLET | Freq: Three times a day (TID) | ORAL | Status: DC | PRN
  Administered 2017-05-29 – 2017-05-31 (×3): 0.25 mg via ORAL
  Filled 2017-05-29 (×3): qty 1

## 2017-05-29 MED ORDER — MAGNESIUM SULFATE IN D5W 1-5 GM/100ML-% IV SOLN
1.0000 g | Freq: Once | INTRAVENOUS | Status: AC
  Administered 2017-05-29: 1 g via INTRAVENOUS
  Filled 2017-05-29: qty 100

## 2017-05-29 MED ORDER — IPRATROPIUM-ALBUTEROL 0.5-2.5 (3) MG/3ML IN SOLN
3.0000 mL | Freq: Once | RESPIRATORY_TRACT | Status: AC
  Administered 2017-05-29: 12:00:00 3 mL via RESPIRATORY_TRACT

## 2017-05-29 MED ORDER — IPRATROPIUM-ALBUTEROL 0.5-2.5 (3) MG/3ML IN SOLN
3.0000 mL | RESPIRATORY_TRACT | Status: DC | PRN
  Administered 2017-05-29 – 2017-06-02 (×14): 3 mL via RESPIRATORY_TRACT
  Filled 2017-05-29 (×3): qty 3
  Filled 2017-05-29: qty 9
  Filled 2017-05-29 (×11): qty 3

## 2017-05-29 MED ORDER — IPRATROPIUM-ALBUTEROL 0.5-2.5 (3) MG/3ML IN SOLN
3.0000 mL | Freq: Once | RESPIRATORY_TRACT | Status: AC
  Administered 2017-05-29: 3 mL via RESPIRATORY_TRACT

## 2017-05-29 MED ORDER — IPRATROPIUM-ALBUTEROL 0.5-2.5 (3) MG/3ML IN SOLN: 3.0000 mL | Freq: Four times a day (QID) | RESPIRATORY_TRACT | Status: DC | PRN

## 2017-05-29 NOTE — Progress Notes (Addendum)
[facility] [place] NAME: Angela Pham    MR#:  [id number]  DATE OF BIRTH:  1941-09-04  SUBJECTIVE:  CHIEF COMPLAINT:   Chief Complaint  Patient presents with  . Shortness of Breath  Struggling to breathe with minimal exertion when she went to bathroom. REVIEW OF SYSTEMS:  Review of Systems  Constitutional: Positive for malaise/fatigue. Negative for chills, fever and weight loss.  HENT: Negative for nosebleeds and sore throat.   Eyes: Negative for blurred vision.  Respiratory: Positive for cough, shortness of breath and wheezing.   Cardiovascular: Negative for chest pain, orthopnea, leg swelling and PND.  Gastrointestinal: Negative for abdominal pain, constipation, diarrhea, heartburn, nausea and vomiting.  Genitourinary: Negative for dysuria and urgency.  Musculoskeletal: Negative for back pain.  Skin: Negative for rash.  Neurological: Positive for weakness. Negative for dizziness, speech change, focal weakness and headaches.  Endo/Heme/Allergies: Does not bruise/bleed easily.  Psychiatric/Behavioral: Negative for depression.   DRUG ALLERGIES:   Allergies  Allergen Reactions  . Ace Inhibitors Anaphylaxis    Tongue swelling  . Shellfish Allergy Hives and Swelling    Swelling around face and mouth    VITALS:  Blood pressure (!) 143/76, pulse 99, temperature 97.7 F (36.5 C), temperature source Oral, resp. rate (!) 26, height 4\' 11"  (1.499 m), weight 83 kg (183 lb), SpO2 (!) 81 %. PHYSICAL EXAMINATION:  Physical Exam  Constitutional: She is oriented to person, place, and time and well-developed, well-nourished, and in no distress.  HENT:  Head: Normocephalic and atraumatic.  Eyes: Pupils are equal, round, and reactive to light. Conjunctivae and EOM are normal.  Neck: Normal range of motion. Neck supple. No tracheal deviation present. No thyromegaly present.  Cardiovascular: Normal rate, regular rhythm and normal heart  sounds.   Pulmonary/Chest: Accessory muscle usage present. Tachypnea noted. She is in respiratory distress. She has decreased breath sounds. She has wheezes. She exhibits no tenderness.  Abdominal: Soft. Bowel sounds are normal. She exhibits no distension. There is no tenderness.  Musculoskeletal: Normal range of motion.  Neurological: She is alert and oriented to person, place, and time. No cranial nerve deficit.  Skin: Skin is warm and dry. No rash noted.  Psychiatric: Mood and affect normal.   LABORATORY PANEL:  Female CBC  Recent Labs Lab 05/29/17 0519  WBC 8.4  HGB 11.0*  HCT 32.8*  PLT 287   ------------------------------------------------------------------------------------------------------------------ Chemistries   Recent Labs Lab 05/28/17 2324 05/29/17 0519  NA  --  140  K  --  3.8  CL  --  104  CO2  --  25  GLUCOSE  --  198*  BUN  --  35*  CREATININE  --  1.49*  CALCIUM  --  8.0*  MG 1.2*  --    RADIOLOGY:  Dg Chest 2 View  Result Date: 05/28/2017 CLINICAL DATA:  Dyspnea EXAM: CHEST  2 VIEW COMPARISON:  05/07/2017 chest radiograph. FINDINGS: Stable cardiomediastinal silhouette with mild cardiomegaly and aortic atherosclerosis. No pneumothorax. No pleural effusion. Mild pulmonary edema. IMPRESSION: Mild congestive heart failure. Electronically Signed   By: Ilona Sorrel M.D.   On: 05/28/2017 16:33   Ct Angio Chest Pe W And/or Wo Contrast  Result Date: 05/28/2017 CLINICAL DATA:  Increasing dyspnea over the past few days with exertion in particular. History of asthma. EXAM: CT ANGIOGRAPHY CHEST WITH CONTRAST TECHNIQUE: Multidetector CT imaging of the chest was performed using the standard  protocol during bolus administration of intravenous contrast. Multiplanar CT image reconstructions and MIPs were obtained to evaluate the vascular anatomy. CONTRAST:  60 cc Isovue 370 IV COMPARISON:  05/28/2017 CXR FINDINGS: Cardiovascular: No acute pulmonary embolus. Aortic  atherosclerosis with mild ectasia of the ascending aorta. Cardiomegaly without pericardial effusion or thickening. Coronary arteriosclerosis along the left main, LAD, RCA and circumflex. Mild dilatation of the main pulmonary artery to 3.4 cm consistent with a component of chronic pulmonary hypertension. Mediastinum/Nodes: No enlarged mediastinal, hilar, or axillary lymph nodes. Thyroid gland, trachea, and esophagus demonstrate no significant findings. Lungs/Pleura: Limited by respiratory motion artifacts. There is mild bronchiectasis to both lower lobes and right middle lobe with streaky atelectasis at each lung base. No pneumonic consolidation, effusion or pneumothorax. There appears to be centrilobular emphysema bilaterally. Upper Abdomen: No acute abnormality. Musculoskeletal: No chest wall abnormality. No acute or significant osseous findings. Review of the MIP images confirms the above findings. IMPRESSION: 1. Cardiomegaly with aortic atherosclerosis. 2. Mild centrilobular emphysema. 3. No pulmonary embolus. Mild dilatation of the main pulmonary artery to 3.4 cm which may reflect chronic pulmonary hypertensive change. 4. Coronary arteriosclerosis. Aortic Atherosclerosis (ICD10-I70.0) and Emphysema (ICD10-J43.9). Electronically Signed   By: Ashley Royalty M.D.   On: 05/28/2017 17:54   ASSESSMENT AND PLAN:  This is a 76 y.o. female with a history of asthma, CHF, hypertension, hyperlipidemia, diabetes admitted with:   #Acute on chronic hypoxic respiratory failure -Due to COPD exacerbation -Minimal exertion she became hypoxic unable to maintain her saturations.  Required nonrebreather mask which also did not help her and trying BiPAP.  At this point she is tachycardic and tachypneic and will likely need transfer to stepdown/CCU.  Case discussed with Dr. Lyndel Safe and nursing  #. COPD/Asthma exacerbation: -Continue nebulizers, O2 and tapering steroids -Continue ellipta, Xyzal, Singulair -Expectorant as  needed -Pulmonology consult   #. Elevated troponin, likely demand ischemia.  -No MI  #. History of CHF Continue Lasix  #. History of hypertension - Continue hydralazine, Lasix  #. History of hyperlipidemia - Continue Lipitor  #. History of anemia - She feels that Iron has been making her constipated so she has stopped  #. History of diabetes - Continue regular insulin sliding scale and metformin     All the records are reviewed and case discussed with Care Management/Social Worker. Management plans discussed with the patient, nursing, Dr. Lyndel Safe and they are in agreement.  CODE STATUS: full code  TOTAL TIME (critical care) TAKING CARE OF THIS PATIENT: 35 minutes.   More than 50% of the time was spent in counseling/coordination of care: YES  POSSIBLE D/C IN 2-3 DAYS, DEPENDING ON CLINICAL CONDITION. And pulmo eval   Max Sane M.D on 05/29/2017 at 11:38 AM  Between 7am to 6pm - Pager - [phone]  After 6pm go to [email or web address] - Technical brewer  Hospitalists  Office  [phone]  CC: Primary care physician; Center, [facility]  Note: This dictation was prepared with Dragon dictation along with smaller phrase technology. Any transcriptional errors that result from this process are unintentional.

## 2017-05-29 NOTE — Progress Notes (Signed)
[facility] delivered pt's new nebulizer machine to her room (101) for personal use; machine placed in pt's personal belonging bag

## 2017-05-29 NOTE — Clinical Social Work Note (Signed)
CSW received consult that patient needs a new nebulizer at home, please consult case manager, she can assist with this.  CSW to sign off please reconsult if social work needs arise.  Jones Broom. [place], MSW, [place]  05/29/2017 9:18 AM

## 2017-05-29 NOTE — Progress Notes (Addendum)
A&O patient admitted to room 101 with COPD. Pt denies pain. Pt is on 2 liters oxygen, dyspnea noted with exertion. Pt up to [facility] with standby assist. Pt provided education relating to admission diagnosis and new medications given since arrival to room. Pt verbalized understanding. Pt is ST on off unit telemetry. VSS. Afebrile since arrival to room. Moderate fall risk with bed alarm activated. Yellow arm band and brown socks on patient.

## 2017-05-29 NOTE — Care Management Note (Signed)
Case Management Note  Patient Details  Name: Angela Pham MRN: [id number] Date of Birth: [birth year]  Subjective/Objective:     Call to Melene Muller at [facility] requesting a home nebulizer machine. Ms Tippy has chronic COPD and takes Duoneb which requires a nebulizer machine. Ms Whan reports that she does not have a nebulizer machine at home.               Action/Plan:   Expected Discharge Date:  05/30/17               Expected Discharge Plan:     In-House Referral:     Discharge planning Services     Post Acute Care Choice:    Choice offered to:     DME Arranged:    DME Agency:     HH Arranged:    HH Agency:     Status of Service:     If discussed at [employer], dates discussed:    Additional Comments:  Child Campoy A, RN 05/29/2017, 11:01 AM

## 2017-05-29 NOTE — Consult Note (Signed)
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 COPD / ASTHMA     PLAN Continue BIPAP as tolerated. Wean O2 for sats >90%. Lasix IV bid. Will check BNP. On steroids and BD. No indication for antibiotics at this time. PFT's as out-patient. NPO. Swallow evaluation once stable. Glycemic control on steroids.  DVT/GI prophylaxis. Full Code. Family updated at bedside.  Cc: 64 minutes  Dimas Chyle MD PCCM    ADMISSION DATE:  05/28/2017 CONSULTATION DATE:  05/29/2017  REFERRING MD :  Dr. Manuella Ghazi  CHIEF COMPLAINT:  Resp failure   HISTORY OF PRESENT ILLNESS:   76 y.o. female with a known history of asthma, CHF, hypertension, hyperlipidemia, diabetes admitted yesterday for worsening dyspnea over last few days. Initially admitted to floor but noted to be dyspneic with higher O2 requirements this morning. Transferred to ICU on BIPAP. Denies any dysphagia. Feels breathing a little better on BIPAP. No known sick contacts. Recent hospitalization within last month.   PAST MEDICAL HISTORY :  Past Medical History:  Diagnosis Date  . Asthma   . CHF (congestive heart failure) ([place])   . Hyperlipidemia   . Hypertension   . Morbid obesity ([place])   . Type II diabetes mellitus ([place])    Past Surgical History:  Procedure Laterality Date  . c-section    . RIGHT/LEFT HEART CATH AND CORONARY ANGIOGRAPHY N/A 03/09/2017   Procedure: Right/Left Heart Cath and Coronary Angiography;  Surgeon: Minna Merritts, MD;  Location: [facility];  Service: Cardiovascular;  Laterality: N/A;   Prior to Admission medications   Medication Sig Start Date End Date Taking? Authorizing Provider  aspirin EC 81 MG EC tablet Take 1 tablet (81 mg total) by mouth daily. 03/12/17  Yes Mody, Ulice Bold, MD  atorvastatin (LIPITOR) 40 MG tablet Take 40 mg by mouth daily.   Yes [provider]  ferrous sulfate 325 (65 FE) MG tablet Take  1 tablet (325 mg total) by mouth 2 (two) times daily with a meal. 05/08/17  Yes Vaughan Basta, MD  furosemide (LASIX) 40 MG tablet Take 1 tablet (40 mg total) by mouth daily. 03/13/17  Yes [name], MD  hydrALAZINE (APRESOLINE) 25 MG tablet Take 1 tablet (25 mg total) by mouth every 8 (eight) hours. Patient taking differently: Take 50 mg by mouth daily.  05/08/17  Yes Vaughan Basta, MD  INCRUSE ELLIPTA 62.5 MCG/INH AEPB Inhale 1 puff into the lungs daily. 03/16/17  Yes [provider]  Ipratropium-Albuterol (COMBIVENT IN) Inhale 3 puffs into the lungs every 4 (four) hours as needed.   Yes [provider]  ipratropium-albuterol (DUONEB) 0.5-2.5 (3) MG/3ML SOLN Inhale 3 mLs into the lungs every 4 (four) hours as needed.   Yes [provider]  lactulose (CHRONULAC) 10 GM/15ML solution Take 30 g by mouth daily.   Yes [provider]  levocetirizine (XYZAL) 5 MG tablet Take 10 mg by mouth daily. 03/18/17  Yes [provider]  metFORMIN (GLUCOPHAGE) 850 MG tablet Take 1 tablet by mouth 2 (two) times daily.   Yes [provider]  mirtazapine (REMERON) 45 MG tablet Take 1 tablet by mouth at bedtime.   Yes [provider]  montelukast (SINGULAIR) 10 MG tablet Take 1 tablet (10 mg total) by mouth at bedtime. 03/11/17  Yes Mody, Ulice Bold, MD  Multiple Vitamins-Minerals (EYE VITAMINS PO) Take 1 tablet by mouth daily.   Yes [provider]  predniSONE (STERAPRED UNI-PAK 21 TAB) 10 MG (21) TBPK tablet Take 6 tabs first day, 5 tab on day 2, then 4 on day 3rd, 3 tabs on day 4th , 2 tab on day 5th, and 1 tab on 6th day. Patient not taking: Reported on 05/28/2017 05/08/17   Vaughan Basta, MD  tiotropium (SPIRIVA) 18 MCG inhalation capsule Place 1 capsule (18 mcg total) into inhaler and inhale daily. Patient not taking: Reported on 05/04/2017 03/12/17   [name], MD  verapamil (CALAN-SR) 180 MG CR tablet Take 2 tablets (360 mg  total) by mouth daily. Patient not taking: Reported on 05/28/2017 03/12/17 04/10/17  [name], MD   Allergies  Allergen Reactions  . Ace Inhibitors Anaphylaxis    Tongue swelling  . Shellfish Allergy Hives and Swelling    Swelling around face and mouth     FAMILY HISTORY:  Family History  Problem Relation Age of Onset  . CAD Father   . Colon cancer Sister   . Leukemia Brother   . Throat cancer Brother   . Cervical cancer Sister    SOCIAL HISTORY:  reports that she has never smoked. She has never used smokeless tobacco. She reports that she does not drink alcohol or use drugs.  REVIEW OF SYSTEMS:   Constitutional: Feels well. Cardiovascular: No chest pain.  Pulmonary: Denies dyspnea.   The remainder of systems were reviewed and were found to be negative other than what is documented in the HPI.    VITAL SIGNS: Temp:  [97.7 F (36.5 C)-98.3 F (36.8 C)] 98.3 F (36.8 C) (09/02 1300) Pulse Rate:  [99-127] 107 (09/02 1300) Resp:  [20-26] 21 (09/02 1300) BP: (112-184)/(66-97) 112/66 (09/02 1300) SpO2:  [81 %-100 %] 100 % (09/02 1300) FiO2 (%):  [100 %] 100 % (09/02 1300) Weight:  [183 lb (83 kg)-189 lb (85.7 kg)] 183 lb (83 kg) (09/01 2301) HEMODYNAMICS:   VENTILATOR SETTINGS: FiO2 (%):  [100 %] 100 % INTAKE / OUTPUT:  Intake/Output Summary (Last 24 hours) at 05/29/17 1442 Last data filed at 05/29/17 1211  Gross per 24 hour  Intake           983.04 ml  Output              750 ml  Net           233.04 ml    Physical Examination:   VS: BP 112/66 (BP Location: Left Arm)   Pulse (!) 107   Temp 98.3 F (36.8 C) (Oral)   Resp (!) 21   Ht 4\' 11"  (1.499 m)   Wt 183 lb (83 kg)   SpO2 100%   BMI 36.96 kg/m    General Appearance: No distress  Neuro: non-focal HEENT: PERRLA, EOM intact, no ptosis, no other lesions noticed;  Pulmonary: crackles b/l + Cardiovascular : Normal S1,S2.  No m/r/g.    Abdomen: Benign, Soft, non-tender, No masses, hepatosplenomegaly, No  lymphadenopathy Extremities: edema +   LABS: Reviewed   LABORATORY PANEL:   CBC  Recent Labs Lab 05/29/17 0519  WBC 8.4  HGB 11.0*  HCT 32.8*  PLT 287    Chemistries   Recent Labs Lab 05/28/17 2324 05/29/17 0519  NA  --  140  K  --  3.8  CL  --  104  CO2  --  25  GLUCOSE  --  198*  BUN  --  35*  CREATININE  --  1.49*  CALCIUM  --  8.0*  MG 1.2*  --   PHOS 2.8  --      Recent Labs Lab 05/29/17 1243  GLUCAP 240*   No results for input(s): PHART, PCO2ART, PO2ART in the last 168 hours. No results for input(s): AST, ALT, ALKPHOS, BILITOT, ALBUMIN in the last 168 hours.  Cardiac Enzymes  Recent Labs Lab 05/29/17 1156  TROPONINI 0.05*    RADIOLOGY:  Dg Chest 2 View  Result Date: 05/28/2017 CLINICAL DATA:  Dyspnea EXAM: CHEST  2 VIEW COMPARISON:  05/07/2017 chest radiograph. FINDINGS: Stable cardiomediastinal silhouette with mild cardiomegaly and aortic atherosclerosis. No pneumothorax. No pleural effusion. Mild pulmonary edema. IMPRESSION: Mild congestive heart failure. Electronically Signed   By: Ilona Sorrel M.D.   On: 05/28/2017 16:33   Ct Angio Chest Pe W And/or Wo Contrast  Result Date: 05/28/2017 CLINICAL DATA:  Increasing dyspnea over the past few days with exertion in particular. History of asthma. EXAM: CT ANGIOGRAPHY CHEST WITH CONTRAST TECHNIQUE: Multidetector CT imaging of the chest was performed using the standard protocol during bolus administration of intravenous contrast. Multiplanar CT image reconstructions and MIPs were obtained to evaluate the vascular anatomy. CONTRAST:  60 cc Isovue 370 IV COMPARISON:  05/28/2017 CXR FINDINGS: Cardiovascular: No acute pulmonary embolus. Aortic atherosclerosis with mild ectasia of the ascending aorta. Cardiomegaly without pericardial effusion or thickening. Coronary arteriosclerosis along the left main, LAD, RCA and circumflex. Mild dilatation of the main pulmonary artery to 3.4 cm consistent with a component  of chronic pulmonary hypertension. Mediastinum/Nodes: No enlarged mediastinal, hilar, or axillary lymph nodes. Thyroid gland, trachea, and esophagus demonstrate no significant findings. Lungs/Pleura: Limited by respiratory motion artifacts. There is mild bronchiectasis to both lower lobes and right middle lobe with streaky atelectasis at each lung base. No pneumonic consolidation, effusion or pneumothorax. There appears to be centrilobular emphysema bilaterally. Upper Abdomen: No acute abnormality. Musculoskeletal: No chest wall abnormality. No acute or significant osseous findings. Review of the MIP images confirms the above findings. IMPRESSION: 1. Cardiomegaly with aortic atherosclerosis. 2. Mild centrilobular emphysema. 3. No pulmonary embolus. Mild dilatation of the main pulmonary artery to 3.4 cm which may reflect chronic pulmonary hypertensive change. 4. Coronary arteriosclerosis. Aortic Atherosclerosis (ICD10-I70.0) and Emphysema (ICD10-J43.9). Electronically Signed   By: Ashley Royalty M.D.   On: 05/28/2017 17:54      05/29/2017, 2:42 PM

## 2017-05-29 NOTE — Progress Notes (Signed)
Please transfer patient to 1-c/Onco Unit if and when ICU team feels patient ready for transfer.

## 2017-05-29 NOTE — Progress Notes (Signed)
Dr. Estanislado Pandy notified of patients request to be a DNR. Confirmed and verified with Silva Bandy, 2nd RN. Order obtained for DNR and purple arm band placed on the pt at that time.

## 2017-05-29 NOTE — Plan of Care (Signed)
Problem: Safety: Goal: Ability to remain free from injury will improve Outcome: Progressing Pt is a moderate fall risk (score of 9). Yellow armband on and yellow socks on. Bed alarm is on.

## 2017-05-29 NOTE — Progress Notes (Signed)
Called pt's niece, [name] [phone], to advise her that the pt was being transferred to ICU Room 1 due to being placed on a BIPAP

## 2017-05-29 NOTE — Progress Notes (Signed)
   05/29/17 1111  PT Visit Information  Last PT Received On 05/29/17  Reason Eval/Treat Not Completed Other (comment) (RN reports that she just got patient off bedside commode; patient very short of breath and unable to participate in PT eval at this time; will re-attempt PT eval when patient is medically ready and appropriate. )  Norwood Levo. Barnet Glasgow, PT, DPT

## 2017-05-29 NOTE — Progress Notes (Signed)
Transfer report called to ICU RN Sarah; pt being transferred via bed to ICU 1

## 2017-05-29 NOTE — Progress Notes (Signed)
PT Cancellation Note  Patient Details Name: Angela Pham MRN: [id number] DOB: 1941/09/21   Cancelled Treatment:    Reason Eval/Treat Not Completed: Medical issues which prohibited therapy (patient transferred to ICU; will need new orders for PT as patient exhibits a change in status; will complete order at this time; Thank you for this referral; )   Trotter,Margaret PT, DPT 05/29/2017, 12:44 PM

## 2017-05-29 NOTE — Progress Notes (Signed)
Pt dyspnea on exertion (oob to bsc), verbalized that she could not breathe, O2 sats on 2L [place] 84%; called respiratory therapist at 1110 unable to get O2 sats up with pt up to 5L [place] down to 77%; respiratory therapist intervened with NRB and then obtained order for BIPAP from Dr Manuella Ghazi at 1116; HR sustained in the 150's with O2 sats increasing; Dr Manuella Ghazi gave order to transfer to the ICU at 1130; respiratory present at bedside; off unit Telemetry monitor placed back on pt due to BIPAP (protocol) Dr Manuella Ghazi aware; Dr Manuella Ghazi verbalized that he will give report to Dr Lyndel Safe in the [place] at bedside at 1142; pt's HR in the 140's BIPAP sat 100%; Dr Manuella Ghazi at bedside, pt transfer pending bed approval and transfer

## 2017-05-29 NOTE — Progress Notes (Signed)
Called to pt room by pt's RN.  Patient saturations in the 70's  HR in the 150's.  Patient placed on NRB mask, patient saturations remained in the 80's RR 40's HR 150's.  Physician notified, order obtained by RN for pt to be placed on BIPAP.  Bipap placed and pt RR remain in the 30's HR 150's oxygen saturations 100%.  Patient given breathing treatments through the BIPAP circuit that were given to RT stat from charge nurse.  Patient is to be transferred to CCU do to patient still having increased work of breathing and HR 150's.

## 2017-05-30 LAB — BASIC METABOLIC PANEL
ANION GAP: 10 (ref 5–15)
BUN: 46 mg/dL — ABNORMAL HIGH (ref 6–20)
CHLORIDE: 104 mmol/L (ref 101–111)
CO2: 27 mmol/L (ref 22–32)
Calcium: 8.2 mg/dL — ABNORMAL LOW (ref 8.9–10.3)
Creatinine, Ser: 1.84 mg/dL — ABNORMAL HIGH (ref 0.44–1.00)
GFR calc non Af Amer: 26 mL/min — ABNORMAL LOW (ref >60)
GFR, EST AFRICAN AMERICAN: 30 mL/min — AB (ref >60)
GLUCOSE: 200 mg/dL — AB (ref 65–99)
Potassium: 3.9 mmol/L (ref 3.5–5.1)
Sodium: 141 mmol/L (ref 135–145)

## 2017-05-30 LAB — CBC
HEMATOCRIT: 31.5 % — AB (ref 35.0–47.0)
HEMOGLOBIN: 10.6 g/dL — AB (ref 12.0–16.0)
MCH: 29.5 pg (ref 26.0–34.0)
MCHC: 33.6 g/dL (ref 32.0–36.0)
MCV: 88 fL (ref 80.0–100.0)
Platelets: 296 10*3/uL (ref 150–440)
RBC: 3.58 MIL/uL — ABNORMAL LOW (ref 3.80–5.20)
RDW: 16.3 % — ABNORMAL HIGH (ref 11.5–14.5)
WBC: 14.7 10*3/uL — AB (ref 3.6–11.0)

## 2017-05-30 LAB — MAGNESIUM: Magnesium: 2 mg/dL (ref 1.7–2.4)

## 2017-05-30 LAB — GLUCOSE, CAPILLARY
GLUCOSE-CAPILLARY: 224 mg/dL — AB (ref 65–99)
Glucose-Capillary: 145 mg/dL — ABNORMAL HIGH (ref 65–99)
Glucose-Capillary: 204 mg/dL — ABNORMAL HIGH (ref 65–99)

## 2017-05-30 LAB — PHOSPHORUS: Phosphorus: 3.1 mg/dL (ref 2.5–4.6)

## 2017-05-30 MED ORDER — FAMOTIDINE 20 MG PO TABS
20.0000 mg | ORAL_TABLET | Freq: Every day | ORAL | Status: DC
  Administered 2017-05-30 – 2017-06-02 (×4): 20 mg via ORAL
  Filled 2017-05-30 (×4): qty 1

## 2017-05-30 MED ORDER — FUROSEMIDE 10 MG/ML IJ SOLN
20.0000 mg | Freq: Once | INTRAMUSCULAR | Status: AC
  Administered 2017-05-30: 20 mg via INTRAVENOUS
  Filled 2017-05-30: qty 2

## 2017-05-30 MED ORDER — VERAPAMIL HCL ER 180 MG PO TBCR
180.0000 mg | EXTENDED_RELEASE_TABLET | Freq: Two times a day (BID) | ORAL | Status: DC
  Administered 2017-05-30 – 2017-06-02 (×5): 180 mg via ORAL
  Filled 2017-05-30 (×8): qty 1

## 2017-05-30 MED ORDER — ENOXAPARIN SODIUM 30 MG/0.3ML ~~LOC~~ SOLN
30.0000 mg | SUBCUTANEOUS | Status: DC
  Administered 2017-05-30 – 2017-06-01 (×3): 30 mg via SUBCUTANEOUS
  Filled 2017-05-30 (×3): qty 0.3

## 2017-05-30 MED ORDER — LACTULOSE 10 GM/15ML PO SOLN
30.0000 g | Freq: Once | ORAL | Status: AC
  Administered 2017-05-30: 30 g via ORAL
  Filled 2017-05-30: qty 60

## 2017-05-30 NOTE — Progress Notes (Signed)
Lovenox changed to 30 mg daily for CrCl <30 and BMI <40. 

## 2017-05-30 NOTE — Progress Notes (Signed)
Iv to po conversion changed for famotidine per iv to po protocol  Thomasenia Sales, PharmD, MBA, [facility]

## 2017-05-30 NOTE — Progress Notes (Signed)
[facility] [place] NAME: Angela Pham    MR#:  [id number]  DATE OF BIRTH:  May 14, 1941  SUBJECTIVE:  CHIEF COMPLAINT:   Chief Complaint  Patient presents with  . Shortness of Breath  Now on nasal cannula oxygen.  She required transfer to ICU yesterday as was not able to maintain her saturations and required BiPAP. REVIEW OF SYSTEMS:  Review of Systems  Constitutional: Positive for malaise/fatigue. Negative for chills, fever and weight loss.  HENT: Negative for nosebleeds and sore throat.   Eyes: Negative for blurred vision.  Respiratory: Positive for cough, shortness of breath and wheezing.   Cardiovascular: Negative for chest pain, orthopnea, leg swelling and PND.  Gastrointestinal: Negative for abdominal pain, constipation, diarrhea, heartburn, nausea and vomiting.  Genitourinary: Negative for dysuria and urgency.  Musculoskeletal: Negative for back pain.  Skin: Negative for rash.  Neurological: Positive for weakness. Negative for dizziness, speech change, focal weakness and headaches.  Endo/Heme/Allergies: Does not bruise/bleed easily.  Psychiatric/Behavioral: Negative for depression.   DRUG ALLERGIES:   Allergies  Allergen Reactions  . Ace Inhibitors Anaphylaxis    Tongue swelling  . Shellfish Allergy Hives and Swelling    Swelling around face and mouth    VITALS:  Blood pressure 133/68, pulse (!) 104, temperature 97.6 F (36.4 C), temperature source Axillary, resp. rate (!) 21, height 4\' 11"  (1.499 m), weight 83 kg (183 lb), SpO2 98 %. PHYSICAL EXAMINATION:  Physical Exam  Constitutional: She is oriented to person, place, and time and well-developed, well-nourished, and in no distress.  HENT:  Head: Normocephalic and atraumatic.  Eyes: Pupils are equal, round, and reactive to light. Conjunctivae and EOM are normal.  Neck: Normal range of motion. Neck supple. No tracheal deviation present. No thyromegaly present.    Cardiovascular: Normal rate, regular rhythm and normal heart sounds.   Pulmonary/Chest: No accessory muscle usage. No tachypnea. No respiratory distress. She has decreased breath sounds. She has wheezes. She exhibits no tenderness.  Abdominal: Soft. Bowel sounds are normal. She exhibits no distension. There is no tenderness.  Musculoskeletal: Normal range of motion.  Neurological: She is alert and oriented to person, place, and time. No cranial nerve deficit.  Skin: Skin is warm and dry. No rash noted.  Psychiatric: Mood and affect normal.   LABORATORY PANEL:  Female CBC  Recent Labs Lab 05/30/17 0414  WBC 14.7*  HGB 10.6*  HCT 31.5*  PLT 296   ------------------------------------------------------------------------------------------------------------------ Chemistries   Recent Labs Lab 05/30/17 0414 05/30/17 0428  NA 141  --   K 3.9  --   CL 104  --   CO2 27  --   GLUCOSE 200*  --   BUN 46*  --   CREATININE 1.84*  --   CALCIUM 8.2*  --   MG  --  2.0   RADIOLOGY:  Dg Chest Port 1 View  Result Date: 05/29/2017 CLINICAL DATA:  Asthma and congestive heart failure.  Hypoxia. EXAM: PORTABLE CHEST 1 VIEW COMPARISON:  05/28/2017 chest CT FINDINGS: Mild-to-moderate enlargement of the cardiopericardial silhouette with indistinct pulmonary vasculature and hazy perihilar and basilar opacities favoring progressive pulmonary edema or less likely atypical pneumonia. Thoracic spondylosis. IMPRESSION: 1. Progressive hazy perihilar and basilar opacities favoring edema over atypical pneumonia. Underlying cardiomegaly noted. Electronically Signed   By: Van Clines M.D.   On: 05/29/2017 14:51   ASSESSMENT AND PLAN:  This is a 76 y.o.  female with a history of asthma, CHF, hypertension, hyperlipidemia, diabetes admitted with:   #Acute on chronic hypoxic respiratory failure -Due to COPD exacerbation -Minimal exertion she became hypoxic unable to maintain her saturations.   -Require  transfer to ICU yesterday for BiPAP and now she is weaned down to nasal cannula oxygen. -She will likely benefit from trilogy a similar device as an outpatient to avoid recurrent hospitalization.  #. COPD/Asthma exacerbation: -Continue nebulizers, O2 and tapering steroids -Continue ellipta, Xyzal, Singulair -Expectorant as needed -Pulmonology input appreciated  #. Elevated troponin, likely demand ischemia.  -No MI  #. History of CHF Continue Lasix  #. History of hypertension - Continue hydralazine, Lasix  #. History of hyperlipidemia - Continue Lipitor  #. History of anemia - She feels that Iron has been making her constipated so she has stopped  #. History of diabetes - Continue regular insulin sliding scale and metformin     All the records are reviewed and case discussed with Care Management/Social Worker. Management plans discussed with the patient, nursing, Dr. Lyndel Safe and they are in agreement.  CODE STATUS: full code  TOTAL TIME TAKING CARE OF THIS PATIENT: 35 minutes.   More than 50% of the time was spent in counseling/coordination of care: YES  POSSIBLE D/C IN 2-3 DAYS, DEPENDING ON CLINICAL CONDITION. And pulmo eval   Max Sane M.D on 05/30/2017 at 12:56 PM  Between 7am to 6pm - Pager - [phone]  After 6pm go to [email or web address] - Technical brewer [facility]  Office  [phone]  CC: Primary care physician; Center, [facility]  Note: This dictation was prepared with Dragon dictation along with smaller phrase technology. Any transcriptional errors that result from this process are unintentional.

## 2017-05-30 NOTE — Progress Notes (Signed)
[facility] Progess Note   ASSESSMENT  ACUTE ON CHRONIC HYPOXIC RESP FAILURE CHF EXACERBATION ANEMIA ? COPD / ASTHMA                PLAN Weaned off BIPAP and on nasal canula. Maintain sats >90%.  On Lasix, steroids and BD.  PFT's as out-patient. Swallow evaluation pending. Glycemic control on steroids.  DVT/GI prophylaxis. Full Code.  Transfer out from ICU if okay with 1 team. Will sign off. Please call with questions.  Dimas Chyle MD   SUBJECTIVE:  Patient seen and examined at bedside. Weaned off BIPAP overnight. Feels breathing is better. Net negative fluid balance. Denies chest pain.    VITAL SIGNS: Temp:  [97.6 F (36.4 C)] 97.6 F (36.4 C) (09/03 0749) Pulse Rate:  [83-119] 115 (09/03 1312) Resp:  [13-32] 32 (09/03 1312) BP: (106-166)/(58-100) 162/90 (09/03 1312) SpO2:  [94 %-100 %] 95 % (09/03 1312) FiO2 (%):  [35 %] 35 % (09/03 0300)   Intake/Output Summary (Last 24 hours) at 05/30/17 1324 Last data filed at 05/30/17 1300  Gross per 24 hour  Intake               50 ml  Output             1825 ml  Net            -1775 ml     PHYSICAL EXAMINATION: Physical Examination:   VS: BP (!) 162/90   Pulse (!) 115   Temp 97.6 F (36.4 C) (Axillary)   Resp (!) 32   Ht 4\' 11"  (1.499 m)   Wt 183 lb (83 kg)   SpO2 95%   BMI 36.96 kg/m   General Appearance: No distress  Neuro:without focal findings, mental status normal. HEENT: PERRLA, EOM intact. Pulmonary: normal breath sounds   CardiovascularNormal S1,S2.  No m/r/g.   Abdomen: Benign, Soft, non-tender. Renal:  No costovertebral tenderness  GU:  Not performed at this time. Endocrine: No evident thyromegaly. Skin:   warm, no rashes, no ecchymosis  Extremities: normal, no cyanosis, clubbing.    LABORATORY PANEL:   CBC  Recent Labs Lab 05/30/17 0414  WBC 14.7*  HGB 10.6*  HCT 31.5*  PLT 296    Chemistries   Recent Labs Lab 05/30/17 0414 05/30/17 0428  NA 141   --   K 3.9  --   CL 104  --   CO2 27  --   GLUCOSE 200*  --   BUN 46*  --   CREATININE 1.84*  --   CALCIUM 8.2*  --   MG  --  2.0  PHOS  --  3.1     Recent Labs Lab 05/29/17 1243 05/29/17 1701 05/29/17 2120 05/30/17 1116  GLUCAP 240* 171* 260* 224*    Recent Labs Lab 05/29/17 1458  PHART 7.39  PCO2ART 45  PO2ART 329*   No results for input(s): AST, ALT, ALKPHOS, BILITOT, ALBUMIN in the last 168 hours.  Cardiac Enzymes  Recent Labs Lab 05/29/17 1156  TROPONINI 0.05*    RADIOLOGY:  Dg Chest 2 View  Result Date: 05/28/2017 CLINICAL DATA:  Dyspnea EXAM: CHEST  2 VIEW COMPARISON:  05/07/2017 chest radiograph. FINDINGS: Stable cardiomediastinal silhouette with mild cardiomegaly and aortic atherosclerosis. No pneumothorax. No pleural effusion. Mild pulmonary edema. IMPRESSION: Mild congestive heart failure. Electronically Signed   By: Ilona Sorrel M.D.   On: 05/28/2017 16:33   Ct Angio Chest Pe W And/or Wo  Contrast  Result Date: 05/28/2017 CLINICAL DATA:  Increasing dyspnea over the past few days with exertion in particular. History of asthma. EXAM: CT ANGIOGRAPHY CHEST WITH CONTRAST TECHNIQUE: Multidetector CT imaging of the chest was performed using the standard protocol during bolus administration of intravenous contrast. Multiplanar CT image reconstructions and MIPs were obtained to evaluate the vascular anatomy. CONTRAST:  60 cc Isovue 370 IV COMPARISON:  05/28/2017 CXR FINDINGS: Cardiovascular: No acute pulmonary embolus. Aortic atherosclerosis with mild ectasia of the ascending aorta. Cardiomegaly without pericardial effusion or thickening. Coronary arteriosclerosis along the left main, LAD, RCA and circumflex. Mild dilatation of the main pulmonary artery to 3.4 cm consistent with a component of chronic pulmonary hypertension. Mediastinum/Nodes: No enlarged mediastinal, hilar, or axillary lymph nodes. Thyroid gland, trachea, and esophagus demonstrate no significant  findings. Lungs/Pleura: Limited by respiratory motion artifacts. There is mild bronchiectasis to both lower lobes and right middle lobe with streaky atelectasis at each lung base. No pneumonic consolidation, effusion or pneumothorax. There appears to be centrilobular emphysema bilaterally. Upper Abdomen: No acute abnormality. Musculoskeletal: No chest wall abnormality. No acute or significant osseous findings. Review of the MIP images confirms the above findings. IMPRESSION: 1. Cardiomegaly with aortic atherosclerosis. 2. Mild centrilobular emphysema. 3. No pulmonary embolus. Mild dilatation of the main pulmonary artery to 3.4 cm which may reflect chronic pulmonary hypertensive change. 4. Coronary arteriosclerosis. Aortic Atherosclerosis (ICD10-I70.0) and Emphysema (ICD10-J43.9). Electronically Signed   By: Ashley Royalty M.D.   On: 05/28/2017 17:54   Dg Chest Port 1 View  Result Date: 05/29/2017 CLINICAL DATA:  Asthma and congestive heart failure.  Hypoxia. EXAM: PORTABLE CHEST 1 VIEW COMPARISON:  05/28/2017 chest CT FINDINGS: Mild-to-moderate enlargement of the cardiopericardial silhouette with indistinct pulmonary vasculature and hazy perihilar and basilar opacities favoring progressive pulmonary edema or less likely atypical pneumonia. Thoracic spondylosis. IMPRESSION: 1. Progressive hazy perihilar and basilar opacities favoring edema over atypical pneumonia. Underlying cardiomegaly noted. Electronically Signed   By: Van Clines M.D.   On: 05/29/2017 14:51     05/30/2017

## 2017-05-30 NOTE — Progress Notes (Signed)
Report called to Jinny Blossom, RN on 1C.

## 2017-05-30 NOTE — Care Management (Addendum)
RNCM notified by ICU RN that patient may benefit from NIV. Brad with [facility] notified. Met with patient to discuss discharge planning. She recently moved to [facility] from [place]. She is trying to establish with [place] Medicaid transportation. She lives with a niece. Her PCP is Dr. Lavera Guise and she states that she has been seen by him. She in not on O2 at home. She states she is home bound. She agrees to home health services without preference of agency. [facility]erral to [facility]). She has a new nebulizer that she uses. She denies need for walker. PT requested. ABG on minimum 2 L  O2.

## 2017-05-30 NOTE — Progress Notes (Signed)
Dr. Lyndel Safe gave order to transfer patient to 1C with tele and RN asked MD about ordering patients home med verapamil because patient is asking for it.  RN also asked about patient having bipap at night.  MD stated he would look over home meds and that patient does need bipap at night.

## 2017-05-30 NOTE — Progress Notes (Signed)
Patient moved to room 108 by wheelchair with Baker Janus, [place].  Patient transferred on oxygen and tele monitor.  Breathing treatment for wheezing given prior to moving patient to new room.

## 2017-05-30 NOTE — Progress Notes (Signed)
Nutrition Brief Note  Patient identified on the Malnutrition Screening Tool (MST) Report  Wt Readings from Last 15 Encounters:  05/28/17 183 lb (83 kg)  05/08/17 189 lb 6.4 oz (85.9 kg)  03/11/17 173 lb 4.8 oz (78.6 kg)   She reports intentional weight loss. PO intake has been good. Patient takes hydroxycut at home. No apparent needs  Body mass index is 36.96 kg/m. Patient meets criteria for obese class II based on current BMI.   Current diet order is carb mod, patient is consuming approximately 100% of meals at this time. Labs and medications reviewed.   No nutrition interventions warranted at this time. If nutrition issues arise, please consult RD.   Satira Anis. Lin Glazier, MS, RD LDN Inpatient Clinical Dietitian Pager [phone]

## 2017-05-30 NOTE — Progress Notes (Signed)
RN spoke with Dr. Lyndel Safe and made MD aware that patients blood pressure is 162/90 and that patient is still asking about her verapamil being ordered and RN made MD aware that patient's heart rate is staying in 120's sinus tach while patient's sitting up on the bedside eating. MD acknowledged and stated that he does not want to order verapamil at this time and stated it was okay to go ahead and send patient to her new room on the floor.

## 2017-05-31 LAB — GLUCOSE, CAPILLARY
GLUCOSE-CAPILLARY: 238 mg/dL — AB (ref 65–99)
Glucose-Capillary: 193 mg/dL — ABNORMAL HIGH (ref 65–99)
Glucose-Capillary: 217 mg/dL — ABNORMAL HIGH (ref 65–99)
Glucose-Capillary: 173 mg/dL — ABNORMAL HIGH (ref 65–99)

## 2017-05-31 LAB — MAGNESIUM: MAGNESIUM: 1.8 mg/dL (ref 1.7–2.4)

## 2017-05-31 MED ORDER — INSULIN DETEMIR 100 UNIT/ML ~~LOC~~ SOLN
10.0000 [IU] | SUBCUTANEOUS | Status: DC
  Administered 2017-05-31: 18:00:00 10 [IU] via SUBCUTANEOUS
  Filled 2017-05-31 (×2): qty 0.1

## 2017-05-31 NOTE — Care Management Important Message (Signed)
Important Message  Patient Details  Name: Milley Vining MRN: [id number] Date of Birth: 1941/01/12   Medicare Important Message Given:  Yes    Shelbie Ammons, RN 05/31/2017, 8:01 AM

## 2017-05-31 NOTE — Progress Notes (Signed)
[facility] at [place] NAME: Angela Pham    MR#:  [id number]  DATE OF BIRTH:  [birth year]  SUBJECTIVE:  CHIEF COMPLAINT:   Chief Complaint  Patient presents with  . Shortness of Breath  Now on nasal cannula oxygen. dyspneic on minimal exertion REVIEW OF SYSTEMS:  Review of Systems  Constitutional: Positive for malaise/fatigue. Negative for chills, fever and weight loss.  HENT: Negative for nosebleeds and sore throat.   Eyes: Negative for blurred vision.  Respiratory: Positive for cough, shortness of breath and wheezing.   Cardiovascular: Negative for chest pain, orthopnea, leg swelling and PND.  Gastrointestinal: Negative for abdominal pain, constipation, diarrhea, heartburn, nausea and vomiting.  Genitourinary: Negative for dysuria and urgency.  Musculoskeletal: Negative for back pain.  Skin: Negative for rash.  Neurological: Positive for weakness. Negative for dizziness, speech change, focal weakness and headaches.  Endo/Heme/Allergies: Does not bruise/bleed easily.  Psychiatric/Behavioral: Negative for depression.   DRUG ALLERGIES:   Allergies  Allergen Reactions  . Ace Inhibitors Anaphylaxis    Tongue swelling  . Shellfish Allergy Hives and Swelling    Swelling around face and mouth    VITALS:  Blood pressure (!) 122/95, pulse (!) 104, temperature 97.6 F (36.4 C), temperature source Oral, resp. rate (!) 24, height 4\' 11"  (1.499 m), weight 83 kg (183 lb), SpO2 97 %. PHYSICAL EXAMINATION:  Physical Exam  Constitutional: She is oriented to person, place, and time and well-developed, well-nourished, and in no distress.  HENT:  Head: Normocephalic and atraumatic.  Eyes: Pupils are equal, round, and reactive to light. Conjunctivae and EOM are normal.  Neck: Normal range of motion. Neck supple. No tracheal deviation present. No thyromegaly present.  Cardiovascular: Normal rate, regular rhythm and normal heart sounds.     Pulmonary/Chest: No accessory muscle usage. No tachypnea. No respiratory distress. She has decreased breath sounds. She has wheezes. She exhibits no tenderness.  Abdominal: Soft. Bowel sounds are normal. She exhibits no distension. There is no tenderness.  Musculoskeletal: Normal range of motion.  Neurological: She is alert and oriented to person, place, and time. No cranial nerve deficit.  Skin: Skin is warm and dry. No rash noted.  Psychiatric: Mood and affect normal.   LABORATORY PANEL:  Female CBC  Recent Labs Lab 05/30/17 0414  WBC 14.7*  HGB 10.6*  HCT 31.5*  PLT 296   ------------------------------------------------------------------------------------------------------------------ Chemistries   Recent Labs Lab 05/30/17 0414 05/30/17 0428  NA 141  --   K 3.9  --   CL 104  --   CO2 27  --   GLUCOSE 200*  --   BUN 46*  --   CREATININE 1.84*  --   CALCIUM 8.2*  --   MG  --  2.0   RADIOLOGY:  No results found. ASSESSMENT AND PLAN:  This is a 76 y.o. female with a history of asthma, CHF, hypertension, hyperlipidemia, diabetes admitted with:   #Acute on chronic hypoxic respiratory failure -Due to COPD exacerbation -Minimal exertion she became hypoxic unable to maintain her saturations.   -Require 24 hour stay in ICU for BiPAP and now she is weaned down to nasal cannula oxygen.  Currently on 4 L oxygen via nasal cannula -She will likely benefit from trilogy/ similar device as an outpatient to avoid recurrent hospitalization.  #. COPD/Asthma exacerbation: -Continue nebulizers, O2 and tapering steroids -Continue ellipta, Xyzal, Singulair -Expectorant as needed -Pulmonology input appreciated  #  ARF - Creat 1.4->1.5->1.8  #. Elevated troponin, likely demand ischemia.  -No MI  #. History of CHF Continue Lasix  #. History of hypertension - Continue hydralazine, Lasix  #. History of hyperlipidemia - Continue Lipitor  #. History of anemia - She feels  that Iron has been making her constipated so she has stopped  #. History of diabetes - Continue regular insulin sliding scale  -Start insulin Levemir 10 units subcu nightly as her blood sugars are going up -Resume metformin if her blood sugar remains high and or her renal function improves   Unsure if she would be a candidate for long-term acute care placement   All the records are reviewed and case discussed with Care Management/Social Worker. Management plans discussed with the patient, nursing and they are in agreement.  CODE STATUS: full code  TOTAL TIME TAKING CARE OF THIS PATIENT: 35 minutes.   More than 50% of the time was spent in counseling/coordination of care: YES  POSSIBLE D/C IN 2-3 DAYS, DEPENDING ON CLINICAL CONDITION.    Max Sane M.D on 05/31/2017 at 2:19 PM  Between 7am to 6pm - Pager - [phone]  After 6pm go to [email or web address] - Technical brewer [facility]  Office  [phone]  CC: Primary care physician; Center, [facility]  Note: This dictation was prepared with Dragon dictation along with smaller phrase technology. Any transcriptional errors that result from this process are unintentional.

## 2017-05-31 NOTE — Care Management (Addendum)
Discussed discharge plans with Dr. Manuella Ghazi. Possibly Ms. Rau could go to [facility] unit. [facility]. [facility]. Will contact [place] at [place]. [phone]). [facility] is unable to accept Ms. Dorvil due to the fact she did not have a 3 night stay in ICU.  Shelbie Ammons RN MSN CCM Care Management [phone]

## 2017-05-31 NOTE — Progress Notes (Signed)
Patient has been placed on overnight pulse ox test per order. Found patient on 3 liters o2 with saturation noted at 99%.  SVN given prior to testing in hopes she will rest comfortably without distress.  Fears will need bipap during the night.  No distress at the time. Will continue to monitor.

## 2017-06-01 DIAGNOSIS — J441 Chronic obstructive pulmonary disease with (acute) exacerbation: Principal | ICD-10-CM

## 2017-06-01 LAB — GLUCOSE, CAPILLARY
GLUCOSE-CAPILLARY: 201 mg/dL — AB (ref 65–99)
GLUCOSE-CAPILLARY: 196 mg/dL — AB (ref 65–99)
GLUCOSE-CAPILLARY: 307 mg/dL — AB (ref 65–99)
Glucose-Capillary: 253 mg/dL — ABNORMAL HIGH (ref 65–99)

## 2017-06-01 LAB — CBC
HEMATOCRIT: 34 % — AB (ref 35.0–47.0)
HEMOGLOBIN: 11.4 g/dL — AB (ref 12.0–16.0)
MCH: 30 pg (ref 26.0–34.0)
MCHC: 33.5 g/dL (ref 32.0–36.0)
MCV: 89.4 fL (ref 80.0–100.0)
Platelets: 363 10*3/uL (ref 150–440)
RBC: 3.8 MIL/uL (ref 3.80–5.20)
RDW: 16.3 % — ABNORMAL HIGH (ref 11.5–14.5)
WBC: 15 10*3/uL — ABNORMAL HIGH (ref 3.6–11.0)

## 2017-06-01 LAB — BASIC METABOLIC PANEL
Anion gap: 11 (ref 5–15)
BUN: 60 mg/dL — ABNORMAL HIGH (ref 6–20)
CHLORIDE: 103 mmol/L (ref 101–111)
CO2: 28 mmol/L (ref 22–32)
Calcium: 8.4 mg/dL — ABNORMAL LOW (ref 8.9–10.3)
Creatinine, Ser: 1.86 mg/dL — ABNORMAL HIGH (ref 0.44–1.00)
GFR calc non Af Amer: 26 mL/min — ABNORMAL LOW (ref >60)
GFR, EST AFRICAN AMERICAN: 30 mL/min — AB (ref >60)
GLUCOSE: 231 mg/dL — AB (ref 65–99)
Potassium: 3.7 mmol/L (ref 3.5–5.1)
Sodium: 142 mmol/L (ref 135–145)

## 2017-06-01 MED ORDER — IPRATROPIUM-ALBUTEROL 0.5-2.5 (3) MG/3ML IN SOLN
3.0000 mL | Freq: Four times a day (QID) | RESPIRATORY_TRACT | Status: DC
  Administered 2017-06-01 – 2017-06-02 (×6): 3 mL via RESPIRATORY_TRACT
  Filled 2017-06-01 (×5): qty 3

## 2017-06-01 MED ORDER — PREDNISONE 50 MG PO TABS: 50.0000 mg | ORAL_TABLET | Freq: Every day | ORAL | Status: DC

## 2017-06-01 MED ORDER — PREDNISONE 20 MG PO TABS: 40.0000 mg | ORAL_TABLET | Freq: Every day | ORAL | Status: DC

## 2017-06-01 MED ORDER — PREDNISONE 20 MG PO TABS: 20.0000 mg | ORAL_TABLET | Freq: Every day | ORAL | Status: DC

## 2017-06-01 MED ORDER — FUROSEMIDE 40 MG PO TABS
40.0000 mg | ORAL_TABLET | Freq: Two times a day (BID) | ORAL | Status: DC
  Administered 2017-06-01 – 2017-06-02 (×3): 40 mg via ORAL
  Filled 2017-06-01 (×3): qty 1

## 2017-06-01 MED ORDER — INSULIN DETEMIR 100 UNIT/ML ~~LOC~~ SOLN
5.0000 [IU] | SUBCUTANEOUS | Status: AC
  Administered 2017-06-01: 18:00:00 5 [IU] via SUBCUTANEOUS
  Filled 2017-06-01: qty 0.05

## 2017-06-01 MED ORDER — FUROSEMIDE 10 MG/ML IJ SOLN: 60.0000 mg | Freq: Two times a day (BID) | INTRAMUSCULAR | Status: DC

## 2017-06-01 MED ORDER — PREDNISONE 20 MG PO TABS: 30.0000 mg | ORAL_TABLET | Freq: Every day | ORAL | Status: DC

## 2017-06-01 MED ORDER — HYDRALAZINE HCL 20 MG/ML IJ SOLN
10.0000 mg | Freq: Once | INTRAMUSCULAR | Status: AC
  Administered 2017-06-01: 06:00:00 10 mg via INTRAVENOUS
  Filled 2017-06-01: qty 1

## 2017-06-01 MED ORDER — PREDNISONE 10 MG PO TABS: 10.0000 mg | ORAL_TABLET | Freq: Every day | ORAL | Status: DC

## 2017-06-01 MED ORDER — ALPRAZOLAM 0.5 MG PO TABS
0.2500 mg | ORAL_TABLET | Freq: Three times a day (TID) | ORAL | Status: DC
  Administered 2017-06-01 – 2017-06-02 (×5): 0.25 mg via ORAL
  Filled 2017-06-01 (×5): qty 1

## 2017-06-01 MED ORDER — SENNOSIDES-DOCUSATE SODIUM 8.6-50 MG PO TABS
2.0000 | ORAL_TABLET | Freq: Two times a day (BID) | ORAL | Status: DC
  Administered 2017-06-01 – 2017-06-02 (×3): 2 via ORAL
  Filled 2017-06-01 (×3): qty 2

## 2017-06-01 MED ORDER — PREDNISONE 50 MG PO TABS
60.0000 mg | ORAL_TABLET | Freq: Every day | ORAL | Status: AC
  Administered 2017-06-02: 09:00:00 60 mg via ORAL
  Filled 2017-06-01: qty 1

## 2017-06-01 NOTE — Progress Notes (Signed)
[place] at [place] NAME: Angela Pham    MR#:  [id number]  DATE OF BIRTH:  [birth year]  SUBJECTIVE:  CHIEF COMPLAINT:   Chief Complaint  Patient presents with  . Shortness of Breath  slowly improving. Sitting in chair REVIEW OF SYSTEMS:  Review of Systems  Constitutional: Positive for malaise/fatigue. Negative for chills, fever and weight loss.  HENT: Negative for nosebleeds and sore throat.   Eyes: Negative for blurred vision.  Respiratory: Positive for shortness of breath. Negative for cough and wheezing.   Cardiovascular: Negative for chest pain, orthopnea, leg swelling and PND.  Gastrointestinal: Negative for abdominal pain, constipation, diarrhea, heartburn, nausea and vomiting.  Genitourinary: Negative for dysuria and urgency.  Musculoskeletal: Negative for back pain.  Skin: Negative for rash.  Neurological: Positive for weakness. Negative for dizziness, speech change, focal weakness and headaches.  Endo/Heme/Allergies: Does not bruise/bleed easily.  Psychiatric/Behavioral: Negative for depression.   DRUG ALLERGIES:   Allergies  Allergen Reactions  . Ace Inhibitors Anaphylaxis    Tongue swelling  . Shellfish Allergy Hives and Swelling    Swelling around face and mouth    VITALS:  Blood pressure (!) 132/52, pulse 93, temperature 97.9 F (36.6 C), temperature source Oral, resp. rate 18, height 4\' 11"  (1.499 m), weight 83 kg (183 lb), SpO2 98 %. PHYSICAL EXAMINATION:  Physical Exam  Constitutional: She is oriented to person, place, and time and well-developed, well-nourished, and in no distress.  HENT:  Head: Normocephalic and atraumatic.  Eyes: Pupils are equal, round, and reactive to light. Conjunctivae and EOM are normal.  Neck: Normal range of motion. Neck supple. No tracheal deviation present. No thyromegaly present.  Cardiovascular: Normal rate, regular rhythm and normal heart sounds.   Pulmonary/Chest: No  accessory muscle usage. No tachypnea. No respiratory distress. She has decreased breath sounds. She has wheezes. She exhibits no tenderness.  Abdominal: Soft. Bowel sounds are normal. She exhibits no distension. There is no tenderness.  Musculoskeletal: Normal range of motion.  Neurological: She is alert and oriented to person, place, and time. No cranial nerve deficit.  Skin: Skin is warm and dry. No rash noted.  Psychiatric: Mood and affect normal.   LABORATORY PANEL:  Female CBC  Recent Labs Lab 06/01/17 0519  WBC 15.0*  HGB 11.4*  HCT 34.0*  PLT 363   ------------------------------------------------------------------------------------------------------------------ Chemistries   Recent Labs Lab 05/31/17 2305 06/01/17 0519  NA  --  142  K  --  3.7  CL  --  103  CO2  --  28  GLUCOSE  --  231*  BUN  --  60*  CREATININE  --  1.86*  CALCIUM  --  8.4*  MG 1.8  --    RADIOLOGY:  No results found. ASSESSMENT AND PLAN:  This is a 76 y.o. female with a history of asthma, CHF, hypertension, hyperlipidemia, diabetes admitted with:  #Acute on chronic hypoxic respiratory failure -Due to COPD exacerbation -Improving slowly, wean oxygen as tolerated -She will likely benefit from trilogy/ similar device as an outpatient to avoid recurrent hospitalization.  #. COPD/Asthma exacerbation: -Continue nebulizers, O2 and tapering steroids -Continue ellipta, Xyzal, Singulair -Expectorant as needed -Pulmonology input appreciated  # ARF - Creat 1.4->1.5->1.8  #. Elevated troponin, likely demand ischemia.  -No MI  #. History of CHF Continue Lasix -I's and O's  #. History of hypertension - Continue hydralazine, Lasix  #. History of hyperlipidemia -  Continue Lipitor  #. History of anemia - She feels that Iron has been making her constipated so she has stopped  #. History of diabetes - Continue regular insulin sliding scale  -continue insulin Levemir 10 units subcu  nightly as her blood sugars are going up -Resume metformin if her blood sugar remains high and or her renal function improves      All the records are reviewed and case discussed with Care Management/Social Worker. Management plans discussed with the patient, nursing and they are in agreement.  CODE STATUS: full code  TOTAL TIME TAKING CARE OF THIS PATIENT: 35 minutes.   More than 50% of the time was spent in counseling/coordination of care: YES  POSSIBLE D/C IN 1-2 DAYS, DEPENDING ON CLINICAL CONDITION.    Max Sane M.D on 06/01/2017 at 3:33 PM  Between 7am to 6pm - Pager - [phone]  After 6pm go to [email or web address] - Technical brewer [place] [facility]  Office  [phone]  CC: Primary care physician; Center, [facility]  Note: This dictation was prepared with Dragon dictation along with smaller phrase technology. Any transcriptional errors that result from this process are unintentional.

## 2017-06-01 NOTE — Evaluation (Signed)
Occupational Therapy Evaluation Patient Details Name: Angela Pham MRN: [id number] DOB: [birth year] Today's Date: 06/01/2017    History of Present Illness Pt admitted for complaints of SOB symptoms. History includes asthma, HTN, and DM. Pt has had 2 recent admissions this summer. This admission has been complicated by admission to CCU secondary to hypoxia.    Clinical Impression   Pt seen for OT evaluation this date. Prior to hospital admission, pt was independent, lives with niece, with 3 hospital admissions in past 6 months. Currently pt is modified independent with biggest limiting factor being poor activity tolerance. At rest, pt O2 sats on room air >95%, with exertion ambulating in room and performing toilet transfers, O2 sats on room air down to 91%, improving to >93% with seated rest break. Pt would benefit from skilled OT to address noted impairments and functional limitations (see below for any additional details).  Pt educated in energy conservation strategies including pursed lip breathing, body positioning, environmental/routine modifications, DME/AE, and work simplification strategies to minimize SOB/fatigue and maximize safety and functional independence. Pt would benefit from additional education/training to reinforce information to support carry over.   Upon hospital discharge, recommend pt discharge back to niece's home. Would benefit from [employer] but transportation is an issue. Pt does not have a driver's license (never learned how to drive) and reports difficulty with previous attempts to use [employer] bus system. Niece works during the day and pt is home by herself.      Follow Up Recommendations  Home health OT (would benefit from [facility] program, but transportation is an issue.)    Equipment Recommendations  Other (comment) (shower chair versus tub transfer bench)    Recommendations for Other Services       Precautions / Restrictions Precautions Precautions:  Fall Restrictions Weight Bearing Restrictions: No      Mobility Bed Mobility               General bed mobility comments: not performed as pt received in upright chair  Transfers Overall transfer level: Independent Equipment used: None             General transfer comment: Upright posture, steady, no LOB, no AD used. On room air throughout session. O2 sats >91% throughout.    Balance Overall balance assessment: Independent                                         ADL either performed or assessed with clinical judgement   ADL Overall ADL's : Modified independent                                       General ADL Comments: Pt primarily limited by poor activity tolerance, SOB easily. Educated in body positioning for self care to minimize SOB, pt verbalized understanding.     Vision Baseline Vision/History: No visual deficits Patient Visual Report: No change from baseline Vision Assessment?: No apparent visual deficits     Perception     Praxis      Pertinent Vitals/Pain Pain Assessment: No/denies pain     Hand Dominance     Extremity/Trunk Assessment Upper Extremity Assessment Upper Extremity Assessment: Overall WFL for tasks assessed   Lower Extremity Assessment Lower Extremity Assessment: Generalized weakness   Cervical / Trunk Assessment Cervical /  Trunk Assessment: Normal   Communication Communication Communication: No difficulties   Cognition Arousal/Alertness: Awake/alert Behavior During Therapy: WFL for tasks assessed/performed Overall Cognitive Status: Within Functional Limits for tasks assessed                                     General Comments       Exercises Exercises: Other exercises Other Exercises Other Exercises: Pt educated in energy conservation strategies including pursed lip breathing, body positioning, environmental/routine modifications, DME/AE, and work simplification  strategies to minimize SOB/fatigue and maximize safety and functional independence. Pt would benefit from additional education/training to reinforce information to support carry over.   Shoulder Instructions      Home Living Family/patient expects to be discharged to:: Private residence Living Arrangements: Other relatives (niece) Available Help at Discharge: Family;Available PRN/intermittently (niece works full time in [employer]) Type of Home: [employer] Access: Stairs to enter Technical brewer of Steps: 3 Entrance Stairs-Rails: Can reach both;Right;Left Home Layout: One level     Bathroom Shower/Tub: Corporate investment banker: Standard     Home Equipment: Grab bars - tub/shower          Prior Functioning/Environment Level of Independence: Independent        Comments: Pt reports she is fairly active, able to walk down her driveway, however is homebound as she doesn't drive. Pt states she never learned how to drive or needed to living in [place] before and always used public transportation.        OT Problem List: Cardiopulmonary status limiting activity;Decreased activity tolerance;Decreased safety awareness      OT Treatment/Interventions: Self-care/ADL training;Therapeutic exercise;Therapeutic activities;DME and/or AE instruction;Energy conservation;Patient/family education    OT Goals(Current goals can be found in the care plan section) Acute Rehab OT Goals Patient Stated Goal: to go home OT Goal Formulation: With patient Time For Goal Achievement: 06/08/17 Potential to Achieve Goals: Good  OT Frequency: Min 2X/week   Barriers to D/C: Decreased caregiver support          Co-evaluation              AM-PAC PT "6 Clicks" Daily Activity     Outcome Measure Help from another person eating meals?: None Help from another person taking care of personal grooming?: None Help from another person toileting, which includes using toliet,  bedpan, or urinal?: None Help from another person bathing (including washing, rinsing, drying)?: A Little Help from another person to put on and taking off regular upper body clothing?: None Help from another person to put on and taking off regular lower body clothing?: None 6 Click Score: 23   End of Session    Activity Tolerance: Patient tolerated treatment well Patient left: in chair;with call bell/phone within reach  OT Visit Diagnosis: Other abnormalities of gait and mobility (R26.89)                Time: 7619-5093 OT Time Calculation (min): 46 min Charges:  OT General Charges $OT Visit: 1 Visit OT Evaluation $OT Eval Low Complexity: 1 Low OT Treatments $Self Care/Home Management : 23-37 mins G-Codes: OT G-codes **NOT FOR INPATIENT CLASS** Functional Assessment Tool Used: AM-PAC 6 Clicks Daily Activity;Clinical judgement Functional Limitation: Self care Self Care Current Status (O6712): At least 1 percent but less than 20 percent impaired, limited or restricted Self Care Goal Status (W5809): At least 1 percent but less than 20  percent impaired, limited or restricted   Jeni Salles, MPH, MS, OTR/L ascom [phone] 06/01/17, 1:35 PM

## 2017-06-01 NOTE — Progress Notes (Signed)
  [facility] Progess Note   ASSESSMENT  ACUTE ON CHRONIC HYPOXIC RESP FAILURE CHF EXACERBATION ANEMIA ?COPD / ASTHMA  PLAN: Supplemental O2 to maintain O2 sats 88% to 92% Continue incruse ellipta at discharge Transition off iv steroids will need to be discharged on a prednisone taper Will need outpatient PFT's and will need an outpatient sleep study Will schedule hospital follow-up appointment with Dr. Mortimer Fries in the office within 3-4 weeks of hospital discharge   -PCCM will sign off 06/01/17 thank you for the consultation if you need further assistance please call the on call pager# listed in AMION   SUBJECTIVE:  Pt states she feels better and she is no longer wheezing sitting up in chair.   VITAL SIGNS: Temp:  [97.6 F (36.4 C)-97.8 F (36.6 C)] 97.8 F (36.6 C) (09/05 0307) Pulse Rate:  [89-112] 89 (09/05 0645) Resp:  [24] 24 (09/05 0307) BP: (122-178)/(65-95) 137/67 (09/05 0645) SpO2:  [95 %-99 %] 98 % (09/05 0740)   Intake/Output Summary (Last 24 hours) at 06/01/17 1046 Last data filed at 06/01/17 0800  Gross per 24 hour  Intake              123 ml  Output                0 ml  Net              123 ml     PHYSICAL EXAMINATION: Physical Examination:   VS: BP 137/67 (BP Location: Left Arm)   Pulse 89   Temp 97.8 F (36.6 C)   Resp (!) 24   Ht 4\' 11"  (1.499 m)   Wt 83 kg (183 lb)   SpO2 98%   BMI 36.96 kg/m   General Appearance: No distress  Neuro:without focal findings, mental status normal. HEENT: PERRLA, EOM intact. Pulmonary: diminished throughout, even, non labored  Cardiovascular: s1s2, rrr, no M/R/G  Abdomen: Benign, Soft, non-tender. Skin:   warm, no rashes, no ecchymosis  Extremities: normal, no cyanosis, or clubbing     LABORATORY PANEL:   CBC  Recent Labs Lab 06/01/17 0519  WBC 15.0*  HGB 11.4*  HCT 34.0*  PLT 363    Chemistries   Recent Labs Lab 05/30/17 0428 05/31/17 2305 06/01/17 0519  NA  --    --  142  K  --   --  3.7  CL  --   --  103  CO2  --   --  28  GLUCOSE  --   --  231*  BUN  --   --  60*  CREATININE  --   --  1.86*  CALCIUM  --   --  8.4*  MG 2.0 1.8  --   PHOS 3.1  --   --      Recent Labs Lab 05/30/17 2129 05/31/17 0744 05/31/17 1137 05/31/17 1659 05/31/17 2028 06/01/17 0759  GLUCAP 204* 193* 217* 173* 238* 201*    Recent Labs Lab 05/29/17 1458  PHART 7.39  PCO2ART 45  PO2ART 329*   No results for input(s): AST, ALT, ALKPHOS, BILITOT, ALBUMIN in the last 168 hours.  Cardiac Enzymes  Recent Labs Lab 05/29/17 1156  TROPONINI 0.05*    RADIOLOGY:  No results found.   Marda Stalker, [place] Pager [phone] (please enter 7 digits) PCCM Consult Pager [phone] (please enter 7 digits)

## 2017-06-01 NOTE — Plan of Care (Addendum)
Problem: Education: Goal: Knowledge of [facility] Education information/materials will improve Outcome: Progressing Hypertensive during shift, refused scheduled PO Verapamil, states BID dosage causes lightheadedness.  Still hypertensive at AM VS check, Dr. Estanislado Pandy paged, ordered one-time dose IV Hydralazine 10mg .  Denies pain.  Reported insomnia, received PRN PO Xanax 0.25mg .  6 beats Vtach per telemetry, pt in NAD, denies any symptoms.  Dr. Ara Kussmaul paged, ordered Mg lab draw, 1.8.  No other needs overnight.  Bed in low position, call bell within reach.  WCTM.

## 2017-06-01 NOTE — Progress Notes (Signed)
Pt.'s nebulizer treatments were changed to QID and Q4 prn. They were previously ordered just prn. She has been needing them almost every 4 hours. Pt. Agreed to this change.

## 2017-06-01 NOTE — Progress Notes (Signed)
Inpatient Diabetes Program Recommendations  AACE/ADA: New Consensus Statement on Inpatient Glycemic Control (2015)  Target Ranges:  Prepandial:   less than 140 mg/dL      Peak postprandial:   less than 180 mg/dL (1-2 hours)      Critically ill patients:  140 - 180 mg/dL   Lab Results  Component Value Date   GLUCAP 253 (H) 06/01/2017   HGBA1C 5.8 (H) 03/09/2017    Review of Glycemic ControlResults for DAREEN, GUTZWILLER (MRN [id number]) as of 06/01/2017 14:55  Ref. Range 05/31/2017 11:37 05/31/2017 16:59 05/31/2017 20:28 06/01/2017 07:59 06/01/2017 11:55  Glucose-Capillary Latest Ref Range: 65 - 99 mg/dL 217 (H) 173 (H) 238 (H) 201 (H) 253 (H)    Diabetes history: none Current orders for Inpatient glycemic control:  Prednisone taper, Novolog sensitive tid with meals and HS, Levemir 5 units daily  Inpatient Diabetes Program Recommendations:   While on prednisone, consider adding Novolog meal coverage 4 units tid with meals.  Once steroids are tapered, patient will likely not need Novolog meal coverage.  Thanks, Adah Perl, RN, BC-ADM Inpatient Diabetes Coordinator Pager [phone] (8a-5p)

## 2017-06-01 NOTE — Evaluation (Addendum)
Physical Therapy Evaluation Patient Details Name: Angela Pham MRN: [id number] DOB: March 19, 1941 Today's Date: 06/01/2017   History of Present Illness  Pt admitted for complaints of SOB symptoms. History includes asthma, HTN, and DM. Pt has had 2 recent admissions this summer. This admission has been complicated by admission to CCU secondary to hypoxia.   Clinical Impression  Pt is a pleasant 76 year old female who was admitted for complaints of SOB symptoms. Pt performs transfers with independence and ambulation with supervision and no AD. Donned 3L of O2 for all mobility with sats WNL. Pt performed 5 time sit<>Stand in 14 seconds demonstrating good power strength, however fatigues with exertion. Pt demonstrates deficits with strength/endurance. Would benefit from skilled PT to address above deficits and promote optimal return to PLOF. Recommend transition to [facility] upon discharge from acute hospitalization.       Follow Up Recommendations Home health PT    Equipment Recommendations  None recommended by PT    Recommendations for Other Services  [facility] program)     Precautions / Restrictions Precautions Precautions: Fall Restrictions Weight Bearing Restrictions: No      Mobility  Bed Mobility               General bed mobility comments: not performed as pt received in upright chair  Transfers Overall transfer level: Independent Equipment used: None             General transfer comment: upright posture noted without use of AD. 3L of O2 used for all mobility  Ambulation/Gait Ambulation/Gait assistance: Supervision Ambulation Distance (Feet): 120 Feet Assistive device: None Gait Pattern/deviations: Step-through pattern     General Gait Details: ambulated without AD with safe technique. Did laps in room as pt independent with mobility. Pt able to safely navigate room obstacles with ease, no LOB noted. Pt fatigues with increased distance, however O2 sats remain at  98%.  Stairs            Wheelchair Mobility    Modified Rankin (Stroke Patients Only)       Balance Overall balance assessment: Independent                                           Pertinent Vitals/Pain Pain Assessment: No/denies pain    Home Living Family/patient expects to be discharged to:: Private residence Living Arrangements:  (niece) Available Help at Discharge: Family Type of Home: House Home Access: Stairs to enter Entrance Stairs-Rails: Can reach both Entrance Stairs-Number of Steps: 3 Home Layout: One level Home Equipment: None      Prior Function Level of Independence: Independent         Comments: Pt reports she is fairly active, able to walk down her driveway, however is homebound as she doesn't drive.     Hand Dominance        Extremity/Trunk Assessment   Upper Extremity Assessment Upper Extremity Assessment: Overall WFL for tasks assessed    Lower Extremity Assessment Lower Extremity Assessment: Generalized weakness (B LE grossly 4+/5)       Communication   Communication: No difficulties  Cognition Arousal/Alertness: Awake/alert Behavior During Therapy: WFL for tasks assessed/performed Overall Cognitive Status: Within Functional Limits for tasks assessed  General Comments      Exercises Other Exercises Other Exercises: Seated ther-ex performed including B LE LAQ and seated marches. 5 time sit<>stand also performed in 14 seconds.   Assessment/Plan    PT Assessment Patient needs continued PT services  PT Problem List Decreased strength;Decreased activity tolerance;Cardiopulmonary status limiting activity       PT Treatment Interventions Gait training;Therapeutic exercise    PT Goals (Current goals can be found in the Care Plan section)  Acute Rehab PT Goals Patient Stated Goal: to go home PT Goal Formulation: With patient Time For Goal  Achievement: 06/15/17 Potential to Achieve Goals: Good    Frequency Min 2X/week   Barriers to discharge        Co-evaluation               AM-PAC PT "6 Clicks" Daily Activity  Outcome Measure Difficulty turning over in bed (including adjusting bedclothes, sheets and blankets)?: None Difficulty moving from lying on back to sitting on the side of the bed? : None Difficulty sitting down on and standing up from a chair with arms (e.g., wheelchair, bedside commode, etc,.)?: None Help needed moving to and from a bed to chair (including a wheelchair)?: None Help needed walking in hospital room?: None Help needed climbing 3-5 steps with a railing? : A Little 6 Click Score: 23    End of Session Equipment Utilized During Treatment: Oxygen Activity Tolerance: Patient tolerated treatment well Patient left: in chair Nurse Communication: Mobility status PT Visit Diagnosis: Muscle weakness (generalized) (M62.81)    Time: 5631-4970 PT Time Calculation (min) (ACUTE ONLY): 18 min   Charges:   PT Evaluation $PT Eval Low Complexity: 1 Low PT Treatments $Therapeutic Exercise: 8-22 mins   PT G Codes:   PT G-Codes **NOT FOR INPATIENT CLASS** Functional Assessment Tool Used: AM-PAC 6 Clicks Basic Mobility Functional Limitation: Mobility: Walking and moving around Mobility: Walking and Moving Around Current Status (Y6378): At least 1 percent but less than 20 percent impaired, limited or restricted Mobility: Walking and Moving Around Goal Status [phone]): 0 percent impaired, limited or restricted    Greggory Stallion, PT, DPT [phone]   [name] 06/01/2017, 11:17 AM

## 2017-06-01 NOTE — Progress Notes (Signed)
Nutrition Brief Note  RD received consult for assessment of nutrition requirements/status (COPD GOLD).  Wt Readings from Last 15 Encounters:  05/28/17 183 lb (83 kg)  05/08/17 189 lb 6.4 oz (85.9 kg)  03/11/17 173 lb 4.8 oz (78.6 kg)   Patient seen on 9/3 by RD for malnutrition screening tool. Brief note was written as no needs were identified.  Spoke with patient at bedside. She reports her appetite is good now and was also good PTA. She is eating 100% of her meals. Patient denies any N/V, abdominal pain, difficulty chewing/swallowing. She reports she has been attempting to lose weight intentionally at home without much success. Last year she got down to her lowest weight of 158 lbs by taking hydroxycut (reports her PCP knew she was taking it). Patient reports she is no longer taking hydroxycut. Limited weight history in chart, so no weights available before 03/11/2017. Since 6/15 patient has gained 10 lbs.   Encouraged patient to discuss with her physician before taking any supplements such as hydroxycut. Encouraged intake of a well-balanced diet including lean protein, fruits, vegetables, whole grains. Patient reports she is familiar with carbohydrate-modified diet.  Nutrition-Focused physical exam completed. Findings are no fat depletion, no muscle depletion, and no edema.   Body mass index is 36.96 kg/m. 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   Current diet order is Carbohydrate Modified, patient is consuming approximately 100% of meals at this time. Labs and medications reviewed.   No nutrition interventions warranted at this time. If nutrition issues arise, please consult RD.   Willey Blade, MS, RD, LDN Pager: [phone] After Hours Pager: [phone]

## 2017-06-02 LAB — CBC
HCT: 30.8 % — ABNORMAL LOW (ref 35.0–47.0)
Hemoglobin: 10.4 g/dL — ABNORMAL LOW (ref 12.0–16.0)
MCH: 29.9 pg (ref 26.0–34.0)
MCHC: 33.8 g/dL (ref 32.0–36.0)
MCV: 88.5 fL (ref 80.0–100.0)
PLATELETS: 354 10*3/uL (ref 150–440)
RBC: 3.48 MIL/uL — AB (ref 3.80–5.20)
RDW: 16.3 % — AB (ref 11.5–14.5)
WBC: 13.9 10*3/uL — ABNORMAL HIGH (ref 3.6–11.0)

## 2017-06-02 LAB — GLUCOSE, CAPILLARY
GLUCOSE-CAPILLARY: 341 mg/dL — AB (ref 65–99)
GLUCOSE-CAPILLARY: 290 mg/dL — AB (ref 65–99)
Glucose-Capillary: 154 mg/dL — ABNORMAL HIGH (ref 65–99)
Glucose-Capillary: 175 mg/dL — ABNORMAL HIGH (ref 65–99)
Glucose-Capillary: 223 mg/dL — ABNORMAL HIGH (ref 65–99)

## 2017-06-02 LAB — BASIC METABOLIC PANEL
ANION GAP: 9 (ref 5–15)
BUN: 59 mg/dL — ABNORMAL HIGH (ref 6–20)
CALCIUM: 8.2 mg/dL — AB (ref 8.9–10.3)
CO2: 32 mmol/L (ref 22–32)
Chloride: 103 mmol/L (ref 101–111)
Creatinine, Ser: 1.62 mg/dL — ABNORMAL HIGH (ref 0.44–1.00)
GFR, EST AFRICAN AMERICAN: 35 mL/min — AB (ref >60)
GFR, EST NON AFRICAN AMERICAN: 30 mL/min — AB (ref >60)
GLUCOSE: 213 mg/dL — AB (ref 65–99)
POTASSIUM: 4.2 mmol/L (ref 3.5–5.1)
SODIUM: 144 mmol/L (ref 135–145)

## 2017-06-02 MED ORDER — LORAZEPAM 0.5 MG PO TABS
0.5000 mg | ORAL_TABLET | ORAL | Status: AC
  Administered 2017-06-02: 16:00:00 0.5 mg via ORAL
  Filled 2017-06-02: qty 1

## 2017-06-02 MED ORDER — ALPRAZOLAM 0.25 MG PO TABS: 0.2500 mg | ORAL_TABLET | Freq: Three times a day (TID) | ORAL | 0 refills | Status: DC

## 2017-06-02 MED ORDER — PREDNISONE 10 MG (21) PO TBPK: ORAL_TABLET | ORAL | 0 refills | Status: DC

## 2017-06-02 NOTE — Progress Notes (Signed)
Bipap declined. Pt will call if she feels she needs it.

## 2017-06-02 NOTE — Plan of Care (Signed)
Problem: Education: Goal: Knowledge of [facility] Education information/materials will improve Outcome: Progressing VSS, free of falls during shift.  Denies pain.  Requested BIPAP, tolerated well.  No other needs overnight.  Bed in low position, call bell within reach.  WCTM.

## 2017-06-02 NOTE — Progress Notes (Signed)
Pt for discharge home later this pm. A/o.  Sob with increased exertion.  Dr Manuella Ghazi came andtalked with  Pt regarding  Discharging. Pt was hesitant  In wanting to go.  Anxiety was  Decreased after  Talking with md.  Myself and  Cm brenda  Reinforced what dr Manuella Ghazi  Told pt.  sats  94/95 %. On room air.  Pt to go home  When dtr come to pick her up after work.discharge instructions discussed with pt. presc given to pt   And  That and  Called in med and home meds discussed.  Diet / activity and f/u discussed. Pt to call dr Blima Rich office tomorrow. Pt already has an appt with dr Lavera Guise.  Verbalize understanding of  Plans. Iv site d/cd earlier.

## 2017-06-02 NOTE — Discharge Instructions (Signed)
Acute Respiratory Distress Syndrome, Adult °Acute respiratory distress syndrome is a life-threatening condition in which fluid collects in the lungs. This prevents the lungs from filling with air and passing oxygen into the blood. This can cause the lungs and other vital organs to fail. The condition usually develops following an infection, illness, surgery, or injury. °What are the causes? °This condition may be caused by: °· An infection, such as sepsis or pneumonia. °· A serious injury to the head or chest. °· Severe bleeding from an injury. °· A major surgery. °· Breathing in harmful chemicals or smoke. °· Blood transfusions. °· A blood clot in the lungs. °· Breathing in vomit (aspiration). °· Near-drowning. °· Inflammation of the pancreas (pancreatitis). °· A drug overdose. °What are the signs or symptoms? °Sudden shortness of breath and rapid breathing are the main symptoms of this condition. Other symptoms may include: °· A fast or irregular heartbeat. °· Skin, lips, or fingernails that look blue (cyanosis). °· Confusion. °· Tiredness or loss of energy. °· Chest pain, particularly while taking a breath. °· Coughing. °· Restlessness or anxiety. °· Fever. This is usually present if there is an underlying infection, such as pneumonia. °How is this diagnosed? °This condition is diagnosed based on: °· Your symptoms. °· Medical history. °· A physical exam. During the exam, your health care provider will listen to your heart and check for crackling or wheezing sounds in your lungs. °You may also have other tests to confirm the diagnosis and measure how well your lungs are working. These may include: °· Measuring the amount of oxygen in your blood. Your health care provider will use two methods to do this procedure: °¨ A small device (pulse oximeter) that is placed on your finger, earlobe, or toe. °¨ An arterial blood gas test. A sample of blood is taken from an artery and tested for oxygen levels. °· Blood  tests. °· Chest X-rays or CT scans to look for fluid in the lungs. °· Taking a sample of your sputum to test for infection. °· Heart test, such as an echocardiogram or electrocardiogram. This is done to rule out any heart problems (such as heart failure) that may be causing your symptoms. °· Bronchoscopy. During this test, a thin, flexible tube with a light is passed into the mouth or nose, down the windpipe, and into the lungs. °How is this treated? °Treatment depends on the cause of your condition. The goal is to support you while your lungs heal and the underlying cause is treated. Treatment may include: °· Oxygen therapy. This may be done through: °¨ A tube in your nose or a face mask. °¨ A ventilator. This device helps move air into and out of your lungs through a breathing tube that is inserted into your mouth or nose. °· Continuous positive airway pressure (CPAP). This treatment uses mild air pressure to keep the airways open. A mask or other device will be placed over your nose or mouth. °· Tracheostomy. During this procedure, a small cut is made in your neck to create an opening to your windpipe. A breathing tube is placed directly into your windpipe. The breathing tube is connected to a ventilator. This is done if you have problems with your airway or if you need a ventilator for a long period of time. °· Positioning you to lie on your stomach (prone position). °· Medicines, such as: °¨ Sedatives to help you relax. °¨ Blood pressure medicines. °¨ Antibiotics to treat infection. °¨ Blood thinners   to prevent blood clots. °¨ Diuretics to help prevent excess fluid. °· Fluids and nutrients given through an IV tube. °· Wearing compression stockings on your legs to prevent blood clots. °· Extra corporeal membrane oxygenation (ECMO). This treatment takes blood outside your body, adds oxygen, and removes carbon dioxide. The blood is then returned to your body. This treatment is only used in severe cases. °Follow  these instructions at home: °· Take over-the-counter and prescription medicines only as told by your health care provider. °· Do not use any products that contain nicotine or tobacco, such as cigarettes and e-cigarettes. If you need help quitting, ask your health care provider. °· Limit alcohol intake to no more than 1 drink per day for nonpregnant women and 2 drinks per day for men. One drink equals 12 oz of beer, 5 oz of wine, or 1½ oz of hard liquor. °· Ask friends and family to help you if daily activities make you tired. °· Attend any pulmonary rehabilitation as told by your health care provider. This may include: °¨ Education about your condition. °¨ Exercises. °¨ Breathing training. °¨ Counseling. °¨ Learning techniques to conserve energy. °¨ Nutrition counseling. °· Keep all follow-up visits as told by your health care provider. This is important. °Contact a health care provider if: °· You become short of breath during activity or while resting. °· You develop a cough that does not go away. °· You have a fever. °· Your symptoms do not get better or they get worse. °· You become anxious or depressed. °Get help right away if: °· You have sudden shortness of breath. °· You develop sudden chest pain that does not go away. °· You develop a rapid heart rate. °· You develop swelling or pain in one of your legs. °· You cough up blood. °· You have trouble breathing. °· Your skin, lips, or fingernails turn blue. °These symptoms may represent a serious problem that is an emergency. Do not wait to see if the symptoms will go away. Get medical help right away. Call your local emergency services (911 in the [place] Do not drive yourself to the hospital. °Summary °· Acute respiratory distress syndrome is a life-threatening condition in which fluid collects in the lungs, which leads the lungs and other vital organs to fail. °· This condition usually develops following an infection, illness, surgery, or injury. °· Sudden  shortness of breath and rapid breathing are the main symptoms of acute respiratory distress syndrome. °· Treatment may include oxygen therapy, continuous positive airway pressure (CPAP), tracheostomy, lying on your stomach (prone position), medicines, fluids and nutrients given through an IV tube, compression stockings, and extra corporeal membrane oxygenation (ECMO). °This information is not intended to replace advice given to you by your health care provider. Make sure you discuss any questions you have with your health care provider. °Document Released: 09/13/2005 Document Revised: 08/30/2016 Document Reviewed: 08/30/2016 °Elsevier Interactive Patient Education © 2017 [employer] ° °

## 2017-06-02 NOTE — Progress Notes (Signed)
Medications administered by student RN [id number] with supervision of Clinical Instructor Judah Carchi MSN, RN-BC.  

## 2017-06-02 NOTE — Care Management (Signed)
Discharge to home today per Dr. Manuella Ghazi. Will be followed by [facility] for [place], Physical therapy, and COPD program. Niece will transport to home this afternoon. Shelbie Ammons RN MSN CCM Care Management [phone]

## 2017-06-02 NOTE — Progress Notes (Signed)
SATURATION QUALIFICATIONS: (This note is used to comply with regulatory documentation for home oxygen)  Patient Saturations on Room Air at Rest = 94%  Patient Saturations on Room Air while Ambulating = 96%  Patient Saturations on [place] of oxygen while Ambulating =0%  Please briefly explain why patient needs home oxygen: no need for home 02

## 2017-06-03 ENCOUNTER — Telehealth: Payer: Self-pay

## 2017-06-03 NOTE — Telephone Encounter (Signed)
Spoke with patient she states she is going to go to [facility] for Pulmonary  Nothing further needed.

## 2017-06-03 NOTE — Telephone Encounter (Signed)
-----   Message from [place], [place] sent at 04/30/7206  3:12 PM EDT ----- Regarding: FW: follow up in 4-6 weeks with any provider Please schedule pt for a hosp f/u in a 30 minute slot with any provider in 4-6 weeks for COPD.  Thanks, Misty ----- Message ----- From: Flora Lipps, MD Sent: 06/01/2017  11:01 AM To: Renelda Mom, LPN Subject: follow up in 4-6 weeks with any provider       Hi. Please set up as outpatient

## 2017-06-04 NOTE — Discharge Summary (Signed)
[facility] NAME: Angela Pham    MR#:  [id number]  DATE OF BIRTH:  July 07, 1941  DATE OF ADMISSION:  05/28/2017   ADMITTING PHYSICIAN: Harvie Bridge, DO  DATE OF DISCHARGE: 06/02/2017  6:08 PM  PRIMARY CARE PHYSICIAN: [facility] [place]   ADMISSION DIAGNOSIS:  SOB (shortness of breath) [R06.02] COPD exacerbation (HCC) [J44.1] Acute on chronic congestive heart failure, unspecified heart failure type ([place]) [I50.9] DISCHARGE DIAGNOSIS:  Active Problems:   COPD exacerbation ([place])  SECONDARY DIAGNOSIS:   Past Medical History:  Diagnosis Date  . Asthma   . CHF (congestive heart failure) ([place])   . Hyperlipidemia   . Hypertension   . Morbid obesity ([place])   . Type II diabetes mellitus [facility])    HOSPITAL COURSE:  This is a 76 y.o.femalewith a history of asthma, CHF, hypertension, hyperlipidemia, diabetesadmitted with:  #Acute on chronic hypoxic respiratory failure -Due to COPD exacerbation -She will likely benefit from trilogy/ similar device as an outpatient to avoid recurrent hospitalization. - Unfortunately, she wouldn't qualify for any of these devices (Trilogy/CPAP/BiPAP) without having formal sleep study or nocturnal oximetry. Her ABG results in the Hospital were not enough to qualify her either. - She wouldn't qualify for ALTAC placement as they required 4 nights in ICU which she didn't have. - She was frustrated with all above regulations and we (care mgmt, nursing) explained there isn't much we can do to qualify her.  #. COPD/Asthma exacerbation: -resolved  # SNK:NLZJQB iatrogenic - due to overdiuresis - Creat 1.4->1.5->1.8->1.6  #. Elevated troponin, likely demand ischemia.  -No MI  #. History of CHF On lasix, well compensated at this time  DISCHARGE CONDITIONS:  stable CONSULTS OBTAINED:  Treatment Team:  Flora Lipps, MD DRUG ALLERGIES:   Allergies  Allergen Reactions  . Ace  Inhibitors Anaphylaxis    Tongue swelling  . Shellfish Allergy Hives and Swelling    Swelling around face and mouth    DISCHARGE MEDICATIONS:   Allergies as of 06/02/2017      Reactions   Ace Inhibitors Anaphylaxis   Tongue swelling   Shellfish Allergy Hives, Swelling   Swelling around face and mouth      Medication List    TAKE these medications   ALPRAZolam 0.25 MG tablet Commonly known as:  XANAX Take 1 tablet (0.25 mg total) by mouth 3 (three) times daily.   aspirin 81 MG EC tablet Take 1 tablet (81 mg total) by mouth daily.   atorvastatin 40 MG tablet Commonly known as:  LIPITOR Take 40 mg by mouth daily.   COMBIVENT IN Inhale 3 puffs into the lungs every 4 (four) hours as needed.   DUONEB 0.5-2.5 (3) MG/3ML Soln Generic drug:  ipratropium-albuterol Inhale 3 mLs into the lungs every 4 (four) hours as needed.   EYE VITAMINS PO Take 1 tablet by mouth daily.   ferrous sulfate 325 (65 FE) MG tablet Take 1 tablet (325 mg total) by mouth 2 (two) times daily with a meal.   furosemide 40 MG tablet Commonly known as:  LASIX Take 1 tablet (40 mg total) by mouth daily.   hydrALAZINE 25 MG tablet Commonly known as:  APRESOLINE Take 1 tablet (25 mg total) by mouth every 8 (eight) hours. What changed:  how much to take  when to take this   INCRUSE ELLIPTA 62.5 MCG/INH Aepb Generic drug:  umeclidinium bromide Inhale 1 puff into the lungs daily.   lactulose  10 GM/15ML solution Commonly known as:  CHRONULAC Take 30 g by mouth daily.   levocetirizine 5 MG tablet Commonly known as:  XYZAL Take 10 mg by mouth daily.   metFORMIN 850 MG tablet Commonly known as:  GLUCOPHAGE Take 1 tablet by mouth 2 (two) times daily.   mirtazapine 45 MG tablet Commonly known as:  REMERON Take 1 tablet by mouth at bedtime.   montelukast 10 MG tablet Commonly known as:  SINGULAIR Take 1 tablet (10 mg total) by mouth at bedtime.   predniSONE 10 MG (21) Tbpk tablet Commonly  known as:  STERAPRED UNI-PAK 21 TAB Start 60 mg once daily, taper 10 mg daily until done What changed:  additional instructions   tiotropium 18 MCG inhalation capsule Commonly known as:  SPIRIVA Place 1 capsule (18 mcg total) into inhaler and inhale daily.   verapamil 180 MG CR tablet Commonly known as:  CALAN-SR Take 2 tablets (360 mg total) by mouth daily.            Discharge Care Instructions        Start     Ordered   06/02/17 0000  predniSONE (STERAPRED UNI-PAK 21 TAB) 10 MG (21) TBPK tablet     06/02/17 0719   06/02/17 0000  Increase activity slowly     06/02/17 0719   06/02/17 0000  Diet - low sodium heart healthy     06/02/17 0719   06/02/17 0000  ALPRAZolam (XANAX) 0.25 MG tablet  3 times daily     06/02/17 1518       DISCHARGE INSTRUCTIONS:  Please make sure to follow up with outpt Pulmo and have sleep studies to get Trilogy/CPAP/BiPAP which is highly recommended DIET:  Regular diet DISCHARGE CONDITION:  Stable ACTIVITY:  Activity as tolerated OXYGEN:  Home Oxygen: No.  Oxygen Delivery: room air DISCHARGE LOCATION:  home with [place] for [place], Physical therapy, and COPD program.  If you experience worsening of your admission symptoms, develop shortness of breath, life threatening emergency, suicidal or homicidal thoughts you must seek medical attention immediately by calling 911 or calling your MD immediately  if symptoms less severe.  You Must read complete instructions/literature along with all the possible adverse reactions/side effects for all the Medicines you take and that have been prescribed to you. Take any new Medicines after you have completely understood and accpet all the possible adverse reactions/side effects.   Please note  You were cared for by a hospitalist during your hospital stay. If you have any questions about your discharge medications or the care you received while you were in the hospital after you are  discharged, you can call the unit and asked to speak with the hospitalist on call if the hospitalist that took care of you is not available. Once you are discharged, your primary care physician will handle any further medical issues. Please note that NO REFILLS for any discharge medications will be authorized once you are discharged, as it is imperative that you return to your primary care physician (or establish a relationship with a primary care physician if you do not have one) for your aftercare needs so that they can reassess your need for medications and monitor your lab values.    On the day of Discharge:  VITAL SIGNS:  Blood pressure 140/65, pulse (!) 108, temperature (!) 97.4 F (36.3 C), temperature source Oral, resp. rate 16, height 4\' 11"  (1.499 m), weight 87.1 kg (192 lb 1.6 oz),  SpO2 100 %. PHYSICAL EXAMINATION:  GENERAL:  76 y.o.-year-old patient lying in the bed with no acute distress.  EYES: Pupils equal, round, reactive to light and accommodation. No scleral icterus. Extraocular muscles intact.  HEENT: Head atraumatic, normocephalic. Oropharynx and nasopharynx clear.  NECK:  Supple, no jugular venous distention. No thyroid enlargement, no tenderness.  LUNGS: Normal breath sounds bilaterally, no wheezing, rales,rhonchi or crepitation. No use of accessory muscles of respiration.  CARDIOVASCULAR: S1, S2 normal. No murmurs, rubs, or gallops.  ABDOMEN: Soft, non-tender, non-distended. Bowel sounds present. No organomegaly or mass.  EXTREMITIES: No pedal edema, cyanosis, or clubbing.  NEUROLOGIC: Cranial nerves II through XII are intact. Muscle strength 5/5 in all extremities. Sensation intact. Gait not checked.  PSYCHIATRIC: The patient is alert and oriented x 3.  SKIN: No obvious rash, lesion, or ulcer.  DATA REVIEW:   CBC  Recent Labs Lab 06/02/17 0455  WBC 13.9*  HGB 10.4*  HCT 30.8*  PLT 354    Chemistries   Recent Labs Lab 05/31/17 2305  06/02/17 0455  NA  --    < > 144  K  --   < > 4.2  CL  --   < > 103  CO2  --   < > 32  GLUCOSE  --   < > 213*  BUN  --   < > 59*  CREATININE  --   < > 1.62*  CALCIUM  --   < > 8.2*  MG 1.8  --   --   < > = values in this interval not displayed.   [place], [facility]. Schedule an appointment as soon as possible for a visit in 1 week(s).   Contact information: [place] [address] [phone]        Flora Lipps, MD. Schedule an appointment as soon as possible for a visit in 1 week(s).   Specialties:  Pulmonary Disease, Cardiology Contact information: [place] [address] [phone]           Very high risk for readmissions.  Management plans discussed with the patient, nursing and they are in agreement.  CODE STATUS: Prior   TOTAL TIME TAKING CARE OF THIS PATIENT: 45 minutes.    Max Sane M.D on 06/04/2017 at 1:08 PM  Between 7am to 6pm - Pager - [phone]  After 6pm go to [email or web address] - [employer] [facility]  Office  [phone]  CC: Primary care physician; [facility], [facility]   Note: This dictation was prepared with Dragon dictation along with smaller phrase technology. Any transcriptional errors that result from this process are unintentional.

## 2017-06-11 ENCOUNTER — Inpatient Hospital Stay
Admission: EM | Admit: 2017-06-11 | Discharge: 2017-06-16 | DRG: 871 | Disposition: A | Discharge status: Home Health | Payer: Medicare Other | Attending: Internal Medicine | Admitting: Internal Medicine

## 2017-06-11 DIAGNOSIS — E785 Hyperlipidemia, unspecified: Secondary | ICD-10-CM | POA: Diagnosis present

## 2017-06-11 DIAGNOSIS — J9622 Acute and chronic respiratory failure with hypercapnia: Secondary | ICD-10-CM | POA: Diagnosis present

## 2017-06-11 DIAGNOSIS — I130 Hypertensive heart and chronic kidney disease with heart failure and stage 1 through stage 4 chronic kidney disease, or unspecified chronic kidney disease: Secondary | ICD-10-CM | POA: Diagnosis present

## 2017-06-11 DIAGNOSIS — Y95 Nosocomial condition: Secondary | ICD-10-CM | POA: Diagnosis present

## 2017-06-11 DIAGNOSIS — I447 Left bundle-branch block, unspecified: Secondary | ICD-10-CM | POA: Diagnosis present

## 2017-06-11 DIAGNOSIS — Z7982 Long term (current) use of aspirin: Secondary | ICD-10-CM

## 2017-06-11 DIAGNOSIS — I5040 Unspecified combined systolic (congestive) and diastolic (congestive) heart failure: Secondary | ICD-10-CM

## 2017-06-11 DIAGNOSIS — Z66 Do not resuscitate: Secondary | ICD-10-CM | POA: Diagnosis present

## 2017-06-11 DIAGNOSIS — J9621 Acute and chronic respiratory failure with hypoxia: Secondary | ICD-10-CM | POA: Diagnosis present

## 2017-06-11 DIAGNOSIS — E1122 Type 2 diabetes mellitus with diabetic chronic kidney disease: Secondary | ICD-10-CM | POA: Diagnosis present

## 2017-06-11 DIAGNOSIS — R0602 Shortness of breath: Secondary | ICD-10-CM | POA: Diagnosis not present

## 2017-06-11 DIAGNOSIS — N183 Chronic kidney disease, stage 3 (moderate): Secondary | ICD-10-CM | POA: Diagnosis present

## 2017-06-11 DIAGNOSIS — I5043 Acute on chronic combined systolic (congestive) and diastolic (congestive) heart failure: Secondary | ICD-10-CM | POA: Diagnosis present

## 2017-06-11 DIAGNOSIS — A419 Sepsis, unspecified organism: Secondary | ICD-10-CM | POA: Diagnosis not present

## 2017-06-11 DIAGNOSIS — N179 Acute kidney failure, unspecified: Secondary | ICD-10-CM | POA: Diagnosis present

## 2017-06-11 DIAGNOSIS — Z23 Encounter for immunization: Secondary | ICD-10-CM

## 2017-06-11 DIAGNOSIS — R7989 Other specified abnormal findings of blood chemistry: Secondary | ICD-10-CM

## 2017-06-11 DIAGNOSIS — J9600 Acute respiratory failure, unspecified whether with hypoxia or hypercapnia: Secondary | ICD-10-CM

## 2017-06-11 DIAGNOSIS — K5900 Constipation, unspecified: Secondary | ICD-10-CM | POA: Diagnosis present

## 2017-06-11 DIAGNOSIS — Z9119 Patient's noncompliance with other medical treatment and regimen: Secondary | ICD-10-CM

## 2017-06-11 DIAGNOSIS — J189 Pneumonia, unspecified organism: Secondary | ICD-10-CM

## 2017-06-11 DIAGNOSIS — Z6838 Body mass index (BMI) 38.0-38.9, adult: Secondary | ICD-10-CM

## 2017-06-11 DIAGNOSIS — J9690 Respiratory failure, unspecified, unspecified whether with hypoxia or hypercapnia: Secondary | ICD-10-CM

## 2017-06-11 DIAGNOSIS — L8990 Pressure ulcer of unspecified site, unspecified stage: Secondary | ICD-10-CM | POA: Insufficient documentation

## 2017-06-11 DIAGNOSIS — Z7984 Long term (current) use of oral hypoglycemic drugs: Secondary | ICD-10-CM

## 2017-06-11 DIAGNOSIS — J441 Chronic obstructive pulmonary disease with (acute) exacerbation: Secondary | ICD-10-CM | POA: Diagnosis present

## 2017-06-11 DIAGNOSIS — I248 Other forms of acute ischemic heart disease: Secondary | ICD-10-CM | POA: Diagnosis present

## 2017-06-11 DIAGNOSIS — J440 Chronic obstructive pulmonary disease with acute lower respiratory infection: Secondary | ICD-10-CM | POA: Diagnosis present

## 2017-06-11 DIAGNOSIS — D631 Anemia in chronic kidney disease: Secondary | ICD-10-CM | POA: Diagnosis present

## 2017-06-11 DIAGNOSIS — N289 Disorder of kidney and ureter, unspecified: Secondary | ICD-10-CM

## 2017-06-11 DIAGNOSIS — I429 Cardiomyopathy, unspecified: Secondary | ICD-10-CM | POA: Diagnosis present

## 2017-06-11 DIAGNOSIS — J9601 Acute respiratory failure with hypoxia: Secondary | ICD-10-CM | POA: Diagnosis present

## 2017-06-11 DIAGNOSIS — E872 Acidosis: Secondary | ICD-10-CM | POA: Diagnosis present

## 2017-06-11 DIAGNOSIS — Z79899 Other long term (current) drug therapy: Secondary | ICD-10-CM

## 2017-06-11 DIAGNOSIS — R778 Other specified abnormalities of plasma proteins: Secondary | ICD-10-CM

## 2017-06-11 DIAGNOSIS — J9801 Acute bronchospasm: Secondary | ICD-10-CM | POA: Diagnosis present

## 2017-06-11 DIAGNOSIS — I272 Pulmonary hypertension, unspecified: Secondary | ICD-10-CM | POA: Diagnosis present

## 2017-06-11 DIAGNOSIS — I2510 Atherosclerotic heart disease of native coronary artery without angina pectoris: Secondary | ICD-10-CM | POA: Diagnosis present

## 2017-06-11 HISTORY — DX: Chronic combined systolic (congestive) and diastolic (congestive) heart failure: I50.42

## 2017-06-11 HISTORY — DX: Pulmonary hypertension, unspecified: I27.20

## 2017-06-11 HISTORY — DX: Atherosclerotic heart disease of native coronary artery without angina pectoris: I25.10

## 2017-06-12 ENCOUNTER — Emergency Department: Payer: Medicare Other

## 2017-06-12 ENCOUNTER — Encounter: Payer: Self-pay | Admitting: Emergency Medicine

## 2017-06-12 DIAGNOSIS — J449 Chronic obstructive pulmonary disease, unspecified: Secondary | ICD-10-CM | POA: Diagnosis not present

## 2017-06-12 DIAGNOSIS — J441 Chronic obstructive pulmonary disease with (acute) exacerbation: Secondary | ICD-10-CM | POA: Diagnosis present

## 2017-06-12 DIAGNOSIS — Z23 Encounter for immunization: Secondary | ICD-10-CM | POA: Diagnosis present

## 2017-06-12 DIAGNOSIS — I272 Pulmonary hypertension, unspecified: Secondary | ICD-10-CM | POA: Diagnosis present

## 2017-06-12 DIAGNOSIS — J189 Pneumonia, unspecified organism: Secondary | ICD-10-CM | POA: Diagnosis present

## 2017-06-12 DIAGNOSIS — I130 Hypertensive heart and chronic kidney disease with heart failure and stage 1 through stage 4 chronic kidney disease, or unspecified chronic kidney disease: Secondary | ICD-10-CM | POA: Diagnosis present

## 2017-06-12 DIAGNOSIS — E785 Hyperlipidemia, unspecified: Secondary | ICD-10-CM | POA: Diagnosis present

## 2017-06-12 DIAGNOSIS — I5040 Unspecified combined systolic (congestive) and diastolic (congestive) heart failure: Secondary | ICD-10-CM | POA: Diagnosis not present

## 2017-06-12 DIAGNOSIS — J9601 Acute respiratory failure with hypoxia: Secondary | ICD-10-CM | POA: Diagnosis not present

## 2017-06-12 DIAGNOSIS — I5033 Acute on chronic diastolic (congestive) heart failure: Secondary | ICD-10-CM | POA: Diagnosis not present

## 2017-06-12 DIAGNOSIS — J9622 Acute and chronic respiratory failure with hypercapnia: Secondary | ICD-10-CM | POA: Diagnosis present

## 2017-06-12 DIAGNOSIS — I248 Other forms of acute ischemic heart disease: Secondary | ICD-10-CM | POA: Diagnosis present

## 2017-06-12 DIAGNOSIS — Z7984 Long term (current) use of oral hypoglycemic drugs: Secondary | ICD-10-CM | POA: Diagnosis not present

## 2017-06-12 DIAGNOSIS — I5023 Acute on chronic systolic (congestive) heart failure: Secondary | ICD-10-CM | POA: Diagnosis not present

## 2017-06-12 DIAGNOSIS — I429 Cardiomyopathy, unspecified: Secondary | ICD-10-CM | POA: Diagnosis present

## 2017-06-12 DIAGNOSIS — R0602 Shortness of breath: Secondary | ICD-10-CM | POA: Diagnosis present

## 2017-06-12 DIAGNOSIS — Z6838 Body mass index (BMI) 38.0-38.9, adult: Secondary | ICD-10-CM | POA: Diagnosis not present

## 2017-06-12 DIAGNOSIS — N289 Disorder of kidney and ureter, unspecified: Secondary | ICD-10-CM | POA: Diagnosis not present

## 2017-06-12 DIAGNOSIS — D631 Anemia in chronic kidney disease: Secondary | ICD-10-CM | POA: Diagnosis present

## 2017-06-12 DIAGNOSIS — E1122 Type 2 diabetes mellitus with diabetic chronic kidney disease: Secondary | ICD-10-CM | POA: Diagnosis present

## 2017-06-12 DIAGNOSIS — Z79899 Other long term (current) drug therapy: Secondary | ICD-10-CM | POA: Diagnosis not present

## 2017-06-12 DIAGNOSIS — Z9114 Patient's other noncompliance with medication regimen: Secondary | ICD-10-CM | POA: Diagnosis not present

## 2017-06-12 DIAGNOSIS — L8990 Pressure ulcer of unspecified site, unspecified stage: Secondary | ICD-10-CM | POA: Insufficient documentation

## 2017-06-12 DIAGNOSIS — I2510 Atherosclerotic heart disease of native coronary artery without angina pectoris: Secondary | ICD-10-CM | POA: Diagnosis present

## 2017-06-12 DIAGNOSIS — A419 Sepsis, unspecified organism: Secondary | ICD-10-CM | POA: Diagnosis present

## 2017-06-12 DIAGNOSIS — N179 Acute kidney failure, unspecified: Secondary | ICD-10-CM | POA: Diagnosis present

## 2017-06-12 DIAGNOSIS — Z7982 Long term (current) use of aspirin: Secondary | ICD-10-CM | POA: Diagnosis not present

## 2017-06-12 DIAGNOSIS — J9621 Acute and chronic respiratory failure with hypoxia: Secondary | ICD-10-CM | POA: Diagnosis present

## 2017-06-12 DIAGNOSIS — I5043 Acute on chronic combined systolic (congestive) and diastolic (congestive) heart failure: Secondary | ICD-10-CM | POA: Diagnosis present

## 2017-06-12 DIAGNOSIS — N183 Chronic kidney disease, stage 3 (moderate): Secondary | ICD-10-CM | POA: Diagnosis present

## 2017-06-12 DIAGNOSIS — Y95 Nosocomial condition: Secondary | ICD-10-CM | POA: Diagnosis not present

## 2017-06-12 DIAGNOSIS — I10 Essential (primary) hypertension: Secondary | ICD-10-CM | POA: Diagnosis not present

## 2017-06-12 DIAGNOSIS — E872 Acidosis: Secondary | ICD-10-CM | POA: Diagnosis present

## 2017-06-12 DIAGNOSIS — J440 Chronic obstructive pulmonary disease with acute lower respiratory infection: Secondary | ICD-10-CM | POA: Diagnosis present

## 2017-06-12 LAB — BASIC METABOLIC PANEL
Anion gap: 13 (ref 5–15)
BUN: 65 mg/dL — ABNORMAL HIGH (ref 6–20)
CHLORIDE: 105 mmol/L (ref 101–111)
CO2: 27 mmol/L (ref 22–32)
CREATININE: 2.28 mg/dL — AB (ref 0.44–1.00)
Calcium: 9.1 mg/dL (ref 8.9–10.3)
GFR calc non Af Amer: 20 mL/min — ABNORMAL LOW (ref >60)
GFR, EST AFRICAN AMERICAN: 23 mL/min — AB (ref >60)
Glucose, Bld: 195 mg/dL — ABNORMAL HIGH (ref 65–99)
POTASSIUM: 4.5 mmol/L (ref 3.5–5.1)
SODIUM: 145 mmol/L (ref 135–145)

## 2017-06-12 LAB — CBC
HEMATOCRIT: 30.5 % — AB (ref 35.0–47.0)
HEMATOCRIT: 34.7 % — AB (ref 35.0–47.0)
HEMOGLOBIN: 9.9 g/dL — AB (ref 12.0–16.0)
Hemoglobin: 11.3 g/dL — ABNORMAL LOW (ref 12.0–16.0)
MCH: 29.3 pg (ref 26.0–34.0)
MCH: 29.4 pg (ref 26.0–34.0)
MCHC: 32.4 g/dL (ref 32.0–36.0)
MCHC: 32.6 g/dL (ref 32.0–36.0)
MCV: 90.4 fL (ref 80.0–100.0)
MCV: 90.5 fL (ref 80.0–100.0)
Platelets: 273 10*3/uL (ref 150–440)
Platelets: 379 10*3/uL (ref 150–440)
RBC: 3.37 MIL/uL — ABNORMAL LOW (ref 3.80–5.20)
RBC: 3.84 MIL/uL (ref 3.80–5.20)
RDW: 16.4 % — AB (ref 11.5–14.5)
RDW: 16.8 % — ABNORMAL HIGH (ref 11.5–14.5)
WBC: 22.7 10*3/uL — AB (ref 3.6–11.0)
WBC: 26.8 10*3/uL — ABNORMAL HIGH (ref 3.6–11.0)

## 2017-06-12 LAB — BLOOD GAS, VENOUS
Acid-Base Excess: 1.1 mmol/L (ref 0.0–2.0)
Bicarbonate: 27.1 mmol/L (ref 20.0–28.0)
DELIVERY SYSTEMS: POSITIVE
FIO2: 0.35
O2 Saturation: 95.6 %
PCO2 VEN: 48 mmHg (ref 44.0–60.0)
PO2 VEN: 82 mmHg — AB (ref 32.0–45.0)
Patient temperature: 37
pH, Ven: 7.36 (ref 7.250–7.430)

## 2017-06-12 LAB — COMPREHENSIVE METABOLIC PANEL
ALBUMIN: 2.9 g/dL — AB (ref 3.5–5.0)
ALT: 257 U/L — ABNORMAL HIGH (ref 14–54)
ANION GAP: 10 (ref 5–15)
AST: 199 U/L — AB (ref 15–41)
Alkaline Phosphatase: 74 U/L (ref 38–126)
BUN: 65 mg/dL — AB (ref 6–20)
CO2: 23 mmol/L (ref 22–32)
Calcium: 8.3 mg/dL — ABNORMAL LOW (ref 8.9–10.3)
Chloride: 107 mmol/L (ref 101–111)
Creatinine, Ser: 1.87 mg/dL — ABNORMAL HIGH (ref 0.44–1.00)
GFR calc Af Amer: 30 mL/min — ABNORMAL LOW (ref >60)
GFR calc non Af Amer: 26 mL/min — ABNORMAL LOW (ref >60)
GLUCOSE: 190 mg/dL — AB (ref 65–99)
POTASSIUM: 4.4 mmol/L (ref 3.5–5.1)
SODIUM: 140 mmol/L (ref 135–145)
Total Bilirubin: 0.6 mg/dL (ref 0.3–1.2)
Total Protein: 5.8 g/dL — ABNORMAL LOW (ref 6.5–8.1)

## 2017-06-12 LAB — URINALYSIS, ROUTINE W REFLEX MICROSCOPIC
BILIRUBIN URINE: NEGATIVE
Bacteria, UA: NONE SEEN
GLUCOSE, UA: NEGATIVE mg/dL
Hgb urine dipstick: NEGATIVE
KETONES UR: NEGATIVE mg/dL
LEUKOCYTES UA: NEGATIVE
Nitrite: NEGATIVE
PH: 5 (ref 5.0–8.0)
Protein, ur: 100 mg/dL — AB
Specific Gravity, Urine: 1.012 (ref 1.005–1.030)

## 2017-06-12 LAB — GLUCOSE, CAPILLARY
Glucose-Capillary: 196 mg/dL — ABNORMAL HIGH (ref 65–99)
Glucose-Capillary: 166 mg/dL — ABNORMAL HIGH (ref 65–99)
Glucose-Capillary: 165 mg/dL — ABNORMAL HIGH (ref 65–99)
Glucose-Capillary: 144 mg/dL — ABNORMAL HIGH (ref 65–99)
Glucose-Capillary: 133 mg/dL — ABNORMAL HIGH (ref 65–99)

## 2017-06-12 LAB — LACTIC ACID, PLASMA
LACTIC ACID, VENOUS: 4.4 mmol/L — AB (ref 0.5–1.9)
LACTIC ACID, VENOUS: 2.7 mmol/L — AB (ref 0.5–1.9)

## 2017-06-12 LAB — TROPONIN I
TROPONIN I: 0.1 ng/mL — AB (ref <0.03)
TROPONIN I: 1.64 ng/mL — AB (ref <0.03)
TROPONIN I: 1.79 ng/mL — AB (ref <0.03)
Troponin I: 1.32 ng/mL (ref <0.03)
Troponin I: 1.33 ng/mL (ref <0.03)

## 2017-06-12 LAB — BRAIN NATRIURETIC PEPTIDE: B Natriuretic Peptide: 303 pg/mL — ABNORMAL HIGH (ref 0.0–100.0)

## 2017-06-12 LAB — PHOSPHORUS: PHOSPHORUS: 4.2 mg/dL (ref 2.5–4.6)

## 2017-06-12 LAB — MRSA PCR SCREENING: MRSA BY PCR: NEGATIVE

## 2017-06-12 LAB — PROCALCITONIN: PROCALCITONIN: 0.21 ng/mL

## 2017-06-12 LAB — MAGNESIUM: MAGNESIUM: 2.2 mg/dL (ref 1.7–2.4)

## 2017-06-12 MED ORDER — FUROSEMIDE 20 MG PO TABS
40.0000 mg | ORAL_TABLET | Freq: Every day | ORAL | Status: DC
  Administered 2017-06-12 – 2017-06-14 (×3): 40 mg via ORAL
  Filled 2017-06-12 (×3): qty 2

## 2017-06-12 MED ORDER — ONDANSETRON HCL 4 MG PO TABS: 4.0000 mg | ORAL_TABLET | Freq: Four times a day (QID) | ORAL | Status: DC | PRN

## 2017-06-12 MED ORDER — INSULIN ASPART 100 UNIT/ML ~~LOC~~ SOLN
0.0000 [IU] | SUBCUTANEOUS | Status: DC
  Administered 2017-06-12 (×4): 3 [IU] via SUBCUTANEOUS
  Administered 2017-06-12: 2 [IU] via SUBCUTANEOUS
  Administered 2017-06-13: 3 [IU] via SUBCUTANEOUS
  Administered 2017-06-13: 5 [IU] via SUBCUTANEOUS
  Administered 2017-06-13: 3 [IU] via SUBCUTANEOUS
  Filled 2017-06-12 (×8): qty 1

## 2017-06-12 MED ORDER — GUAIFENESIN ER 600 MG PO TB12
600.0000 mg | ORAL_TABLET | Freq: Two times a day (BID) | ORAL | Status: DC
  Administered 2017-06-12 (×3): 600 mg via ORAL
  Filled 2017-06-12 (×4): qty 1

## 2017-06-12 MED ORDER — ALPRAZOLAM 0.5 MG PO TABS: 0.2500 mg | ORAL_TABLET | Freq: Three times a day (TID) | ORAL | Status: DC | PRN

## 2017-06-12 MED ORDER — SENNOSIDES-DOCUSATE SODIUM 8.6-50 MG PO TABS: 1.0000 | ORAL_TABLET | Freq: Every evening | ORAL | Status: DC | PRN

## 2017-06-12 MED ORDER — ASPIRIN EC 81 MG PO TBEC
81.0000 mg | DELAYED_RELEASE_TABLET | Freq: Every day | ORAL | Status: DC
  Administered 2017-06-12 – 2017-06-16 (×5): 81 mg via ORAL
  Filled 2017-06-12 (×5): qty 1

## 2017-06-12 MED ORDER — ALBUTEROL SULFATE (2.5 MG/3ML) 0.083% IN NEBU
2.5000 mg | INHALATION_SOLUTION | Freq: Four times a day (QID) | RESPIRATORY_TRACT | Status: DC | PRN
  Administered 2017-06-12 – 2017-06-13 (×3): 2.5 mg via RESPIRATORY_TRACT
  Filled 2017-06-12 (×4): qty 3

## 2017-06-12 MED ORDER — SODIUM CHLORIDE 0.9% FLUSH
3.0000 mL | Freq: Two times a day (BID) | INTRAVENOUS | Status: DC
  Administered 2017-06-12 – 2017-06-16 (×9): 3 mL via INTRAVENOUS

## 2017-06-12 MED ORDER — ACETAMINOPHEN 325 MG PO TABS: 650.0000 mg | ORAL_TABLET | Freq: Four times a day (QID) | ORAL | Status: DC | PRN

## 2017-06-12 MED ORDER — OXYCODONE HCL 5 MG PO TABS: 5.0000 mg | ORAL_TABLET | ORAL | Status: DC | PRN

## 2017-06-12 MED ORDER — LEVOCETIRIZINE DIHYDROCHLORIDE 5 MG PO TABS: 10.0000 mg | ORAL_TABLET | Freq: Every day | ORAL | Status: DC

## 2017-06-12 MED ORDER — ALBUTEROL SULFATE (2.5 MG/3ML) 0.083% IN NEBU
2.5000 mg | INHALATION_SOLUTION | Freq: Once | RESPIRATORY_TRACT | Status: AC
  Administered 2017-06-12: 2.5 mg via RESPIRATORY_TRACT
  Filled 2017-06-12: qty 3

## 2017-06-12 MED ORDER — FERROUS SULFATE 325 (65 FE) MG PO TABS
325.0000 mg | ORAL_TABLET | Freq: Two times a day (BID) | ORAL | Status: DC
  Filled 2017-06-12 (×3): qty 1

## 2017-06-12 MED ORDER — HYDRALAZINE HCL 50 MG PO TABS
50.0000 mg | ORAL_TABLET | Freq: Every day | ORAL | Status: DC
  Administered 2017-06-12: 50 mg via ORAL
  Filled 2017-06-12: qty 1

## 2017-06-12 MED ORDER — BISACODYL 5 MG PO TBEC: 5.0000 mg | DELAYED_RELEASE_TABLET | Freq: Every day | ORAL | Status: DC | PRN

## 2017-06-12 MED ORDER — DILTIAZEM HCL 25 MG/5ML IV SOLN
15.0000 mg | Freq: Once | INTRAVENOUS | Status: DC
Start: 2017-06-12 — End: 2017-06-12
  Filled 2017-06-12: qty 5

## 2017-06-12 MED ORDER — PIPERACILLIN-TAZOBACTAM 3.375 G IVPB
3.3750 g | Freq: Two times a day (BID) | INTRAVENOUS | Status: DC
  Administered 2017-06-12 – 2017-06-13 (×2): 3.375 g via INTRAVENOUS
  Filled 2017-06-12 (×2): qty 50

## 2017-06-12 MED ORDER — ACETAMINOPHEN 650 MG RE SUPP: 650.0000 mg | Freq: Four times a day (QID) | RECTAL | Status: DC | PRN

## 2017-06-12 MED ORDER — VANCOMYCIN HCL IN DEXTROSE 750-5 MG/150ML-% IV SOLN
750.0000 mg | INTRAVENOUS | Status: DC
  Administered 2017-06-12 – 2017-06-13 (×2): 750 mg via INTRAVENOUS
  Filled 2017-06-12 (×2): qty 150

## 2017-06-12 MED ORDER — DEXTROMETHORPHAN POLISTIREX ER 30 MG/5ML PO SUER
30.0000 mg | Freq: Two times a day (BID) | ORAL | Status: DC
  Administered 2017-06-12 (×3): 30 mg via ORAL
  Filled 2017-06-12 (×4): qty 5

## 2017-06-12 MED ORDER — SODIUM CHLORIDE 0.9% FLUSH: 3.0000 mL | INTRAVENOUS | Status: DC | PRN

## 2017-06-12 MED ORDER — SODIUM CHLORIDE 0.9 % IV BOLUS (SEPSIS)
500.0000 mL | Freq: Once | INTRAVENOUS | Status: AC
  Administered 2017-06-12: 500 mL via INTRAVENOUS

## 2017-06-12 MED ORDER — VANCOMYCIN HCL IN DEXTROSE 1-5 GM/200ML-% IV SOLN
1000.0000 mg | Freq: Once | INTRAVENOUS | Status: AC
  Administered 2017-06-12: 1000 mg via INTRAVENOUS
  Filled 2017-06-12: qty 200

## 2017-06-12 MED ORDER — ATORVASTATIN CALCIUM 20 MG PO TABS
40.0000 mg | ORAL_TABLET | Freq: Every day | ORAL | Status: DC
  Administered 2017-06-12 – 2017-06-15 (×4): 40 mg via ORAL
  Filled 2017-06-12 (×5): qty 2

## 2017-06-12 MED ORDER — PIPERACILLIN-TAZOBACTAM 3.375 G IVPB 30 MIN
3.3750 g | Freq: Once | INTRAVENOUS | Status: AC
  Administered 2017-06-12: 3.375 g via INTRAVENOUS

## 2017-06-12 MED ORDER — MAGNESIUM CITRATE PO SOLN
1.0000 | Freq: Once | ORAL | Status: DC | PRN
  Filled 2017-06-12: qty 296

## 2017-06-12 MED ORDER — ONDANSETRON HCL 4 MG/2ML IJ SOLN: 4.0000 mg | Freq: Four times a day (QID) | INTRAMUSCULAR | Status: DC | PRN

## 2017-06-12 MED ORDER — METHYLPREDNISOLONE SODIUM SUCC 125 MG IJ SOLR
60.0000 mg | Freq: Four times a day (QID) | INTRAMUSCULAR | Status: DC
  Administered 2017-06-12 – 2017-06-13 (×5): 60 mg via INTRAVENOUS
  Filled 2017-06-12 (×5): qty 2

## 2017-06-12 MED ORDER — PIPERACILLIN-TAZOBACTAM 3.375 G IVPB 30 MIN
INTRAVENOUS | Status: AC
  Administered 2017-06-12: 3.375 g via INTRAVENOUS
  Filled 2017-06-12: qty 50

## 2017-06-12 MED ORDER — IPRATROPIUM BROMIDE 0.02 % IN SOLN
0.5000 mg | Freq: Four times a day (QID) | RESPIRATORY_TRACT | Status: DC | PRN
  Administered 2017-06-12 – 2017-06-13 (×3): 0.5 mg via RESPIRATORY_TRACT
  Filled 2017-06-12 (×4): qty 2.5

## 2017-06-12 MED ORDER — VERAPAMIL HCL 120 MG PO TABS
120.0000 mg | ORAL_TABLET | Freq: Every day | ORAL | Status: DC
  Administered 2017-06-13: 120 mg via ORAL
  Filled 2017-06-12 (×3): qty 1

## 2017-06-12 MED ORDER — LACTULOSE 10 GM/15ML PO SOLN
30.0000 g | ORAL | Status: DC
  Filled 2017-06-12: qty 60

## 2017-06-12 MED ORDER — ENOXAPARIN SODIUM 30 MG/0.3ML ~~LOC~~ SOLN
30.0000 mg | SUBCUTANEOUS | Status: DC
  Administered 2017-06-12 – 2017-06-15 (×4): 30 mg via SUBCUTANEOUS
  Filled 2017-06-12 (×4): qty 0.3

## 2017-06-12 MED ORDER — LORATADINE 10 MG PO TABS
10.0000 mg | ORAL_TABLET | Freq: Every day | ORAL | Status: DC
  Administered 2017-06-12: 10 mg via ORAL
  Filled 2017-06-12: qty 1

## 2017-06-12 MED ORDER — INFLUENZA VAC SPLIT HIGH-DOSE 0.5 ML IM SUSY
0.5000 mL | PREFILLED_SYRINGE | INTRAMUSCULAR | Status: AC
  Administered 2017-06-14: 0.5 mL via INTRAMUSCULAR
  Filled 2017-06-12 (×2): qty 0.5

## 2017-06-12 MED ORDER — PNEUMOCOCCAL VAC POLYVALENT 25 MCG/0.5ML IJ INJ
0.5000 mL | INJECTION | INTRAMUSCULAR | Status: AC
  Administered 2017-06-14: 0.5 mL via INTRAMUSCULAR
  Filled 2017-06-12: qty 0.5

## 2017-06-12 MED ORDER — DM-GUAIFENESIN ER 30-600 MG PO TB12: 1.0000 | ORAL_TABLET | Freq: Two times a day (BID) | ORAL | Status: DC

## 2017-06-12 MED ORDER — UMECLIDINIUM BROMIDE 62.5 MCG/INH IN AEPB
1.0000 | INHALATION_SPRAY | Freq: Every day | RESPIRATORY_TRACT | Status: DC
  Administered 2017-06-12: 1 via RESPIRATORY_TRACT
  Filled 2017-06-12: qty 7

## 2017-06-12 MED ORDER — SODIUM CHLORIDE 0.9 % IV SOLN
250.0000 mL | INTRAVENOUS | Status: DC | PRN
  Administered 2017-06-12: 10 mL/h via INTRAVENOUS

## 2017-06-12 MED ORDER — MIRTAZAPINE 15 MG PO TABS
45.0000 mg | ORAL_TABLET | Freq: Every day | ORAL | Status: DC
  Administered 2017-06-12 – 2017-06-15 (×4): 45 mg via ORAL
  Filled 2017-06-12 (×5): qty 3

## 2017-06-12 MED ORDER — MONTELUKAST SODIUM 10 MG PO TABS
10.0000 mg | ORAL_TABLET | Freq: Every day | ORAL | Status: DC
  Administered 2017-06-12: 10 mg via ORAL
  Filled 2017-06-12: qty 1

## 2017-06-12 NOTE — Progress Notes (Signed)
Pharmacy Antibiotic Note  Angela Pham is a 76 y.o. female admitted on 06/11/2017 with pneumonia.  Pharmacy has been consulted for vanc/zosyn dosing.  Plan: Patient received vanc 1g and zosyn 3.375g IV x 1 in ED  Will start vanc 750 mg IV q24h w/ 6 hr stack dose. Will draw vanc trough 9/18 @ 0800 prior to 3rd dose. Will continue zosyn 3.375g IV q12h per CrCl near 20 ml/min  Ke 0.022 T1/2 31 ~ 24 hrs Goal trough 15 - 20 mcg/mL  Height: 4\' 11"  (149.9 cm) Weight: 190 lb 4.1 oz (86.3 kg) IBW/kg (Calculated) : 43.2  No data recorded.   Recent Labs Lab 06/12/17 0004  WBC 26.8*  CREATININE 2.28*    Estimated Creatinine Clearance: 21 mL/min (A) (by C-G formula based on SCr of 2.28 mg/dL (H)).    Allergies  Allergen Reactions  . Ace Inhibitors Anaphylaxis    Tongue swelling  . Shellfish Allergy Hives and Swelling    Swelling around face and mouth     Thank you for allowing pharmacy to be a part of this patient's care.  Tobie Lords, PharmD, BCPS Clinical Pharmacist 06/12/2017

## 2017-06-12 NOTE — Progress Notes (Signed)
Lab called with critical lactic acid of 4.4. NP, M. Patria Mane, notified. New order received for 553ml bolus over 1 hour.

## 2017-06-12 NOTE — ED Notes (Signed)
David RN, aware of bed assigned  

## 2017-06-12 NOTE — ED Notes (Signed)
Date and time results received: 06/12/17 0037  Test: Tropinon Critical Value: 0.10  Name of Provider Notified: Dahlia Client  Orders Received? Or Actions Taken?: None

## 2017-06-12 NOTE — Progress Notes (Signed)
eLink Physician-Brief Progress Note Patient Name: Angela Pham DOB: 02-Aug-1941 MRN: [id number]   Date of Service  06/12/2017  HPI/Events of Note  89 F with Asthma/COPD/CHF presenting via EMS with sats in the 63s.  Placed on BiPAP and given nebs/mag inroute.  Clinically improved with BiPAP.  Recent d/c from hospital with similar presentation.  States she never clinically improved after d/c.  WBC elevated but unclear as related to steroids and/or infection.  Placed on Zosyn/Vanc empirically for HCAP.  On camera check the patient is HD stable with HR of 102, BP 117/105, RR of 20 and sats of 99% on BiPAP.  She appears comfortable in NAD.  eICU Interventions  Plan of care per primary admitting team. PCCM to follow     Intervention Category Evaluation Type: New Patient Evaluation  DETERDING,ELIZABETH 06/12/2017, 3:38 AM

## 2017-06-12 NOTE — Consult Note (Signed)
PULMONARY / CRITICAL CARE MEDICINE   Name: Angela Pham MRN: [id number] DOB: 1940/11/22    ADMISSION DATE:  06/11/2017   CONSULTATION DATE:  06/12/2017  REFERRING MD:  Dr. Dia Sitter  REASON: Acute hypoxemic respiratory failure  CHIEF COMPLAINT:  Worsening dyspnea  HISTORY OF PRESENT ILLNESS:   This is a 76 year old Caucasian female with a past medical history as indicated below, recent hospitalizations for acute respiratory failure(3 hospitalizations since June 2018), who presented to the ED with complaints of worsening dyspnea. Patient states that symptoms started on Thursday and progressively got worse. Upon EMS arrival, patient was hypoxic with an SPO2 of 60%. She was given Solu-Medrol, magnesium and placed on oxygen. (4 in the ED, she was severely tachypneic and was placed on BiPAP. She reports significant improvement in symptoms. She denies fever, chills, nausea, vomiting, abdominal pain and urinary symptoms. Her chest x-ray in the ED, suggested mild pulmonary vascular congestion with a small left pleural effusion, a WBC of 26.8 thousand, lactic acid of 4.4 and a creatinine level of 2.28, up from her baseline of 1.8. Her last CT chest done on 05/28/2017 showed no acute pulmonary changes except for chronic pulmonary hypertension. Her last echocardiogram done in June showed similar changes: Ventricular ejection fraction of 45-50%, moderately elevated pulmonary artery pressures of 55-60 mmHg.   PAST MEDICAL HISTORY :  She  has a past medical history of Asthma; CHF (congestive heart failure) ([place]); Hyperlipidemia; Hypertension; Morbid obesity ([place]); and Type II diabetes mellitus ([place]).  PAST SURGICAL HISTORY: She  has a past surgical history that includes c-section and RIGHT/LEFT HEART CATH AND CORONARY ANGIOGRAPHY (N/A, 03/09/2017).  Allergies  Allergen Reactions  . Ace Inhibitors Anaphylaxis    Tongue swelling  . Shellfish Allergy Hives and Swelling    Swelling around face and mouth      No current facility-administered medications on file prior to encounter.    Current Outpatient Prescriptions on File Prior to Encounter  Medication Sig  . ALPRAZolam (XANAX) 0.25 MG tablet Take 1 tablet (0.25 mg total) by mouth 3 (three) times daily.  Marland Kitchen aspirin EC 81 MG EC tablet Take 1 tablet (81 mg total) by mouth daily.  Marland Kitchen atorvastatin (LIPITOR) 40 MG tablet Take 40 mg by mouth daily.  . ferrous sulfate 325 (65 FE) MG tablet Take 1 tablet (325 mg total) by mouth 2 (two) times daily with a meal.  . furosemide (LASIX) 40 MG tablet Take 1 tablet (40 mg total) by mouth daily.  . hydrALAZINE (APRESOLINE) 25 MG tablet Take 1 tablet (25 mg total) by mouth every 8 (eight) hours. (Patient taking differently: Take 50 mg by mouth daily. )  . INCRUSE ELLIPTA 62.5 MCG/INH AEPB Inhale 1 puff into the lungs daily.  . Ipratropium-Albuterol (COMBIVENT IN) Inhale 3 puffs into the lungs every 4 (four) hours as needed.  Marland Kitchen ipratropium-albuterol (DUONEB) 0.5-2.5 (3) MG/3ML SOLN Inhale 3 mLs into the lungs every 4 (four) hours as needed.  . lactulose (CHRONULAC) 10 GM/15ML solution Take 30 g by mouth daily.  Marland Kitchen levocetirizine (XYZAL) 5 MG tablet Take 10 mg by mouth daily.  . metFORMIN (GLUCOPHAGE) 850 MG tablet Take 1 tablet by mouth 2 (two) times daily.  . mirtazapine (REMERON) 45 MG tablet Take 1 tablet by mouth at bedtime.  . montelukast (SINGULAIR) 10 MG tablet Take 1 tablet (10 mg total) by mouth at bedtime.  . Multiple Vitamins-Minerals (EYE VITAMINS PO) Take 1 tablet by mouth daily.    FAMILY HISTORY:  Her indicated  that her mother is deceased. She indicated that her father is deceased. She indicated that both of her sisters are deceased. She indicated that both of her brothers are deceased.    SOCIAL HISTORY: She  reports that she has never smoked. She has never used smokeless tobacco. She reports that she does not drink alcohol or use drugs.  REVIEW OF SYSTEMS:   Constitutional: Negative for  fever and chills.  HENT: Negative for congestion and rhinorrhea.  Eyes: Negative for redness and visual disturbance.  Respiratory: positive for shortness of breath and wheezing.  Cardiovascular: Negative for chest pain and palpitations.  Gastrointestinal: Negative  for nausea , vomiting and abdominal pain and  Loose stools Genitourinary: Negative for dysuria and urgency.  Endocrine: Denies polyuria, polyphagia and heat intolerance Musculoskeletal: Negative for myalgias and arthralgias.  Skin: Negative for pallor and wound.  Neurological: Negative for dizziness and headaches   SUBJECTIVE:   VITAL SIGNS: BP (!) 145/71   Pulse (!) 103   Resp 20   Ht 4\' 11"  (1.499 m)   Wt 190 lb 4.1 oz (86.3 kg)   SpO2 100%   BMI 38.43 kg/m   HEMODYNAMICS:    VENTILATOR SETTINGS: FiO2 (%):  [35 %] 35 %  INTAKE / OUTPUT: No intake/output data recorded.  PHYSICAL EXAMINATION: General: Lying bed, on BiPAP, Neuro: Alert and oriented 4, no focal deficits HEENT:  Neck is short, PERRLA, no JVD Cardiovascular: RRR, S1, S2, no murmur, regurg or gallop. No edema Lungs: Mild increase in work of breathing, bilateral breath sounds with expiratory wheezes in the left lung fields, no rhonchi Abdomen:  Obese, normal bowel sounds in all 4 quadrants, no organomegaly Musculoskeletal:  Deformities, positive range of motion Skin:  Warm and dry  LABS:  BMET  Recent Labs Lab 06/12/17 0004  NA 145  K 4.5  CL 105  CO2 27  BUN 65*  CREATININE 2.28*  GLUCOSE 195*    Electrolytes  Recent Labs Lab 06/12/17 0004  CALCIUM 9.1    CBC  Recent Labs Lab 06/12/17 0004  WBC 26.8*  HGB 11.3*  HCT 34.7*  PLT 379    Coag's No results for input(s): APTT, INR in the last 168 hours.  Sepsis Markers No results for input(s): LATICACIDVEN, PROCALCITON, O2SATVEN in the last 168 hours.  ABG No results for input(s): PHART, PCO2ART, PO2ART in the last 168 hours.  Liver Enzymes No results for  input(s): AST, ALT, ALKPHOS, BILITOT, ALBUMIN in the last 168 hours.  Cardiac Enzymes  Recent Labs Lab 06/12/17 0004  TROPONINI 0.10*    Glucose  Recent Labs Lab 06/12/17 0325  GLUCAP 196*    Imaging Dg Chest Portable 1 View  Result Date: 06/12/2017 CLINICAL DATA:  Respiratory distress EXAM: PORTABLE CHEST 1 VIEW COMPARISON:  May 29, 2017 FINDINGS: There is a rather minimal left pleural effusion. There is slight left base atelectasis. There is no edema or consolidation. Heart is enlarged with pulmonary vascularity within normal limits. No adenopathy. No evident bone lesions. IMPRESSION: Mild left base atelectasis with minimal left pleural effusion. Lungs elsewhere clear. Stable cardiomegaly. Electronically Signed   By: Lowella Grip III M.D.   On: 06/12/2017 00:45   STUDIES:  Repeat echo  CULTURES: Blood cultures  ANTIBIOTICS: Zosyn. Vancomycin  SIGNIFICANT EVENTS: 06/12/2017: Admitted  LINES/TUBES: Peripheral IVs  DISCUSSION: This is a 76 year old Caucasian female with a known history of COPD, congestive heart failure, moderate pulmonary hypertension, type 2 diabetes, and possible undiagnosed sleep apnea and obesity  hypoventilation syndrome who presents with recurrent respiratory failure, worsening dyspnea and possible pneumonia. It is unclear why patient's symptoms have persisted despite treatment. She was discharged home on antibiotics and a steroid to paper. However, despite being on these medications her symptoms recurred. There may be an infectious component that has history that this hospitalization, but the possibility of worsening pulmonary edema secondary to untreated obstructive sleep apnea and/or obesity hypoventilation syndrome cannot be excluded. She will probably benefit from home O2  ASSESSMENT  Acute hypoxemic respiratory failure. Pulmonary hypertension. Acute Diastolic heart failure exacerbation HCAP Acute COPD exacerbation AKI Lactic  acidosis History of type 2 diabetes, hypertension and hyperlipidemia  PLAN Continuous BiPAP and titrated off as tolerated Nebulized bronchodilators and steroids. Antibiotics as above. Follow-up cultures Gentle fluid resuscitation Repeat 2-D echo Continue all home medications Gentle IV fluid resuscitation Trend lactic acid level Patient will probably benefit from home O2 DVT prophylaxis  Further changes in treatment plan pending clinical course and diagnostics  FAMILY  - Updates: Patient updated on current treatment plan.    - Inter-disciplinary family meet or Palliative Care meeting due by:  day 7   Waylen Depaolo S. [facility] ANP-BC Pulmonary and Critical Care Medicine [facility] Pager [phone] or [phone] 06/12/2017, 3:41 AM

## 2017-06-12 NOTE — ED Notes (Addendum)
PT notified that VBG is at bedside.

## 2017-06-12 NOTE — Discharge Instructions (Signed)
Heart Failure Clinic appointment on June 21 2017 at 2:00pm  with Darylene Price, [place]. Please call [phone] to reschedule.

## 2017-06-12 NOTE — Progress Notes (Signed)
Dr Jefferson Fuel made aware of critical troponin of 1.79. Repeat timed- troponin ordered.

## 2017-06-12 NOTE — Progress Notes (Signed)
PT Cancellation Note  Patient Details Name: Angela Pham MRN: [id number] DOB: Jan 06, 1941   Cancelled Treatment:    Reason Eval/Treat Not Completed: Medical issues which prohibited therapy. Consult received and chart reviewed. Pt currently on bipap and sleeping at this time. SOB symptoms noted. Not appropriate for therapy at this time. Of note, up-trending troponin (.10->1.32); no cardio consult at this time. Per chart review, appears secondary to demand ischemia. Will re-attempt next date if appropriate.   Chasady Longwell 06/12/2017, 11:46 AM  Greggory Stallion, PT, DPT [phone]

## 2017-06-12 NOTE — ED Triage Notes (Signed)
Patient arrived from home in respiratory distress, pt took 3 duo-nebs at home and EMS gave 3 more duo-nebs en route to ED.  Patient tachypneic on arrival.  MD at bedside during triage, and pt placed on BiPap immediately.  Pt is 100% on BiPap.  Patient was admitted at hospital a couple of weeks ago for the same thing.

## 2017-06-12 NOTE — Progress Notes (Signed)
Initial HF Clinic appointment scheduled on June 21, 2017 at 2:00PM.   Of note, she cancelled a previously scheduled appointment back in August 2018.

## 2017-06-12 NOTE — Progress Notes (Signed)
[facility] NAME: Angela Pham    MR#:  [id number]  DATE OF BIRTH:  July 13, 1941  SUBJECTIVE: Admitted for respiratory distress, found to have CHF exacerbation. The patient was here multiple times this year in June, August 8, September 1, discharged on September 6. Patient is frustrated that she keeps having shortness of breath. Patient complaint of respiratory distress and admitted to hospitalist service this time admitted to ICU. Now on BiPAP. Did not tolerate nasal cannula earlier today.   CHIEF COMPLAINT:   Chief Complaint  Patient presents with  . Respiratory Distress    REVIEW OF SYSTEMS:    Review of Systems  Constitutional: Negative for chills and fever.  HENT: Negative for hearing loss.   Eyes: Negative for blurred vision, double vision and photophobia.  Respiratory: Positive for cough, shortness of breath and wheezing. Negative for hemoptysis.   Cardiovascular: Negative for palpitations, orthopnea and leg swelling.  Gastrointestinal: Negative for abdominal pain, diarrhea and vomiting.  Genitourinary: Negative for dysuria and urgency.  Musculoskeletal: Negative for myalgias and neck pain.  Skin: Negative for rash.  Neurological: Negative for dizziness, focal weakness, seizures, weakness and headaches.  Psychiatric/Behavioral: Negative for memory loss. The patient does not have insomnia.     Nutrition:  Tolerating Diet: Tolerating PT:      DRUG ALLERGIES:   Allergies  Allergen Reactions  . Ace Inhibitors Anaphylaxis    Tongue swelling  . Shellfish Allergy Hives and Swelling    Swelling around face and mouth     VITALS:  Blood pressure 121/64, pulse 83, temperature 98.4 F (36.9 C), temperature source Oral, resp. rate 14, height 4\' 11"  (1.499 m), weight 82.1 kg (181 lb), SpO2 96 %.  PHYSICAL EXAMINATION:   Physical Exam  GENERAL:  76 y.o.-year-old patient lying in the bed with Some respiratory  distress. EYES: Pupils equal, round, reactive to light  . No scleral icterus. Extraocular muscles intact.  HEENT: Head atraumatic, normocephalic. Oropharynx and nasopharynx clear. She has a BiPAP mask on but able to communicate. NECK:  Supple, no jugular venous distention. No thyroid enlargement, no tenderness.  LUNGS faint wheezing bilaterally. CARDIOVASCULAR: S1, S2 normal. No murmurs, rubs, or gallops.  ABDOMEN: Soft, nontender, nondistended. Bowel sounds present. No organomegaly or mass.  EXTREMITIES: No pedal edema, cyanosis, or clubbing.  NEUROLOGIC: Unable to assess neurologic exam because she is on BiPAP.  PSYCHIATRIC: The patient is alert and oriented x 3.  SKIN: No obvious rash, lesion, or ulcer.    LABORATORY PANEL:   CBC  Recent Labs Lab 06/12/17 0633  WBC 22.7*  HGB 9.9*  HCT 30.5*  PLT 273   ------------------------------------------------------------------------------------------------------------------  Chemistries   Recent Labs Lab 06/12/17 0633  NA 140  K 4.4  CL 107  CO2 23  GLUCOSE 190*  BUN 65*  CREATININE 1.87*  CALCIUM 8.3*  MG 2.2  AST 199*  ALT 257*  ALKPHOS 74  BILITOT 0.6   ------------------------------------------------------------------------------------------------------------------  Cardiac Enzymes  Recent Labs Lab 06/12/17 0633  TROPONINI 1.32*   ------------------------------------------------------------------------------------------------------------------  RADIOLOGY:  Dg Chest Portable 1 View  Result Date: 06/12/2017 CLINICAL DATA:  Respiratory distress EXAM: PORTABLE CHEST 1 VIEW COMPARISON:  May 29, 2017 FINDINGS: There is a rather minimal left pleural effusion. There is slight left base atelectasis. There is no edema or consolidation. Heart is enlarged with pulmonary vascularity within normal limits. No adenopathy. No evident bone lesions. IMPRESSION: Mild left base atelectasis with minimal left pleural  effusion.  Lungs elsewhere clear. Stable cardiomegaly. Electronically Signed   By: Lowella Grip III M.D.   On: 06/12/2017 00:45     ASSESSMENT AND PLAN:   Active Problems:   Acute respiratory failure with hypoxia (HCC)   Pressure injury of skin  Recurrent respiratory failure: Admitted 3 times this year already. Discharge 2 weeks ago that time she had pneumonia and COPD flares. Patient comes back again with shortness of breath. #1 acute respiratory failure and chronic respiratory failure with hypoxia, O2 saturation when EMS went to her home were 60%.; Continue BiPAP support, continue bronchodilators, empiric antibiotics with vancomycin, Zosyn. Possible left base pneumonia on x-ray Continue IV steroids.  . Patient had a CT and her chest on September 1 which showed no pulmonary embolus. But has centrilobular emphysema. Patient also has dilated pulmonary artery which reflects chronic pulmonary hypertension.   #2. Non-ST elevation MI: Troponin up to 1.32. Troponin elevation likely due to demand ischemia from respiratory distress. Echocardiogram, cardiology consult will be ordered. She had a cardiac catheter in June of this year and had normal coronaries that time. Patient had elevated right atrial pressures. Pulmonary artery pressure is increased up to 55-60 mm. Her symptoms of shortness of breath may be coming from pulmonary hypertension also...  #3. Acute on chronic renal failure: Patient creatinine 2.28 yesterday to date is 1.87 . #4 .sepsis. Elevated lactic acid, acute renal failure, WBC on admission. Follow culture data, continue IV antibiotics. 5.essential hypertension: Controlled.   All the records are reviewed and case discussed with Care Management/Social Workerr. Management plans discussed with the patient, family and they are in agreement.  CODE STATUS: Full code  TOTAL TIME TAKING CARE OF THIS PATIENT: 35 minutes.   POSSIBLE D/C IN 1-2 DAYS, DEPENDING ON CLINICAL  CONDITION.   Epifanio Lesches M.D on 06/12/2017 at 11:08 AM  Between 7am to 6pm - Pager - [phone]  After 6pm go to [email or web address] - password [email or web address] [facility]  Office  [phone]  CC: Primary care physician; Cletis Athens, MD

## 2017-06-12 NOTE — ED Provider Notes (Signed)
[facility] Emergency Department Provider Note   ____________________________________________   First MD Initiated Contact with Patient 06/12/17 2358     (approximate)  I have reviewed the triage vital signs and the nursing notes.   HISTORY  Chief Complaint Respiratory Distress    HPI Angela Pham is a 76 y.o. female Who comes into the hospital today with some difficulty breathing. The patient states that her symptoms started Thursday. She does have a history of COPD. She took some duo nebs at home prior to EMSs arrival but she kept feeling really short of breath. According to EMS when the fire department arrived the patient's oxygen saturation was in the 60s. She received 3 more duo nebs by EMS 125 mg of Solu-Medrol and 2 g of magnesium sulfate. The patient states that she still is unable to breathe and she is very wheezy. She has been on BiPAP in the past and has been intubated in the past. The patient denies any chest pain any nausea vomiting or abdominal pain. She reports that she was trying to wait it out at home. the patient denies any increase or productive cough. He is had no fevers. She is here today for evaluation.   Past Medical History:  Diagnosis Date  . Asthma   . CHF (congestive heart failure) ([place])   . Hyperlipidemia   . Hypertension   . Morbid obesity ([place])   . Type II diabetes mellitus [facility])     Patient Active Problem List   Diagnosis Date Noted  . COPD exacerbation ([place]) 05/28/2017  . SOB (shortness of breath)   . Acute respiratory failure with hypoxia ([place]) 05/04/2017  . Pulmonary edema 03/07/2017  . CHF (congestive heart failure) ([place]) 03/07/2017    Past Surgical History:  Procedure Laterality Date  . c-section    . RIGHT/LEFT HEART CATH AND CORONARY ANGIOGRAPHY N/A 03/09/2017   Procedure: Right/Left Heart Cath and Coronary Angiography;  Surgeon: Minna Merritts, MD;  Location: [facility];  Service: Cardiovascular;   Laterality: N/A;    Prior to Admission medications   Medication Sig Start Date End Date Taking? Authorizing Provider  ALPRAZolam (XANAX) 0.25 MG tablet Take 1 tablet (0.25 mg total) by mouth 3 (three) times daily. 06/02/17   Max Sane, MD  aspirin EC 81 MG EC tablet Take 1 tablet (81 mg total) by mouth daily. 03/12/17   [name], MD  atorvastatin (LIPITOR) 40 MG tablet Take 40 mg by mouth daily.    [provider]  ferrous sulfate 325 (65 FE) MG tablet Take 1 tablet (325 mg total) by mouth 2 (two) times daily with a meal. 05/08/17   Vaughan Basta, MD  furosemide (LASIX) 40 MG tablet Take 1 tablet (40 mg total) by mouth daily. 03/13/17   [name], MD  hydrALAZINE (APRESOLINE) 25 MG tablet Take 1 tablet (25 mg total) by mouth every 8 (eight) hours. Patient taking differently: Take 50 mg by mouth daily.  05/08/17   Vaughan Basta, MD  INCRUSE ELLIPTA 62.5 MCG/INH AEPB Inhale 1 puff into the lungs daily. 03/16/17   [provider]  Ipratropium-Albuterol (COMBIVENT IN) Inhale 3 puffs into the lungs every 4 (four) hours as needed.    [provider]  ipratropium-albuterol (DUONEB) 0.5-2.5 (3) MG/3ML SOLN Inhale 3 mLs into the lungs every 4 (four) hours as needed.    [provider]  lactulose (CHRONULAC) 10 GM/15ML solution Take 30 g by mouth daily.    [provider]  levocetirizine (XYZAL) 5 MG tablet Take 10 mg by mouth daily. 03/18/17   [provider]  metFORMIN (GLUCOPHAGE) 850 MG tablet Take 1 tablet by mouth 2 (two) times daily.    [provider]  mirtazapine (REMERON) 45 MG tablet Take 1 tablet by mouth at bedtime.    [provider]  montelukast (SINGULAIR) 10 MG tablet Take 1 tablet (10 mg total) by mouth at bedtime. 03/11/17   [name], MD  Multiple Vitamins-Minerals (EYE VITAMINS PO) Take 1 tablet by mouth daily.    [provider]  predniSONE (STERAPRED UNI-PAK 21 TAB) 10 MG (21) TBPK  tablet Start 60 mg once daily, taper 10 mg daily until done 06/02/17   Max Sane, MD  tiotropium (SPIRIVA) 18 MCG inhalation capsule Place 1 capsule (18 mcg total) into inhaler and inhale daily. Patient not taking: Reported on 05/04/2017 03/12/17   [name], MD  verapamil (CALAN-SR) 180 MG CR tablet Take 2 tablets (360 mg total) by mouth daily. 03/12/17 04/10/17  [name], MD    Allergies Ace inhibitors and Shellfish allergy  Family History  Problem Relation Age of Onset  . CAD Father   . Colon cancer Sister   . Leukemia Brother   . Throat cancer Brother   . Cervical cancer Sister     Social History Social History  Substance Use Topics  . Smoking status: Never Smoker  . Smokeless tobacco: Never Used  . Alcohol use No    Review of Systems  Constitutional: No fever/chills Eyes: No visual changes. ENT: No sore throat. Cardiovascular: Denies chest pain. Respiratory:  shortness of breath. Gastrointestinal: No abdominal pain.  No nausea, no vomiting.  No diarrhea.  No constipation. Genitourinary: Negative for dysuria. Musculoskeletal: Negative for back pain. Skin: Negative for rash. Neurological: Negative for headaches, focal weakness or numbness.   ____________________________________________   PHYSICAL EXAM:  VITAL SIGNS: ED Triage Vitals  Enc Vitals Group     BP 06/12/17 0011 (!) 159/95     Pulse Rate 06/12/17 0011 (!) 134     Resp 06/12/17 0011 20     Temp --      Temp src --      SpO2 06/12/17 0011 99 %     Weight 06/12/17 0012 190 lb 4.1 oz (86.3 kg)     Height 06/12/17 0012 4\' 11"  (1.499 m)     Head Circumference --      Peak Flow --      Pain Score --      Pain Loc --      Pain Edu? --      Excl. in [place]? --     Constitutional: Alert and oriented. Ill appearing and in severe respiratory distress. Eyes: Conjunctivae are normal. PERRL. EOMI. Head: Atraumatic. Nose: No congestion/rhinnorhea. Mouth/Throat: Mucous membranes are moist.  Oropharynx  non-erythematous. Cardiovascular: Normal rate, regular rhythm. Grossly normal heart sounds.  Good peripheral circulation. Respiratory: Increased respiratory effort. retractions. Expiratory wheezing in all lung fields Gastrointestinal: Soft and nontender. No distention. positive bowel sounds Musculoskeletal: No lower extremity tenderness nor edema.   Neurologic:  Normal speech and language.  Skin:  Skin is warm, dry and intact.  Psychiatric: Mood and affect are normal.   ____________________________________________   LABS (all labs ordered are listed, but only abnormal results are displayed)  Labs Reviewed  CBC - Abnormal; Notable for the following:       Result Value   WBC 26.8 (*)  Hemoglobin 11.3 (*)    HCT 34.7 (*)    RDW 16.8 (*)    All other components within normal limits  BASIC METABOLIC PANEL - Abnormal; Notable for the following:    Glucose, Bld 195 (*)    BUN 65 (*)    Creatinine, Ser 2.28 (*)    GFR calc non Af Amer 20 (*)    GFR calc Af Amer 23 (*)    All other components within normal limits  TROPONIN I - Abnormal; Notable for the following:    Troponin I 0.10 (*)    All other components within normal limits  BLOOD GAS, VENOUS - Abnormal; Notable for the following:    pO2, Ven 82.0 (*)    All other components within normal limits  CULTURE, BLOOD (ROUTINE X 2)  CULTURE, BLOOD (ROUTINE X 2)   ____________________________________________  EKG  ED ECG REPORT #1 I, Loney Hering, the attending physician, personally viewed and interpreted this ECG.   Date: 06/12/2017  EKG Time: 0003  Rate: 147  Rhythm: sinus tachycardia, occasional PVC noted, unifocal  Axis: normal  Intervals:none  ST&T Change: none  ED ECG REPORT #2 I, Loney Hering, the attending physician, personally viewed and interpreted this ECG.   Date: 06/12/2017  EKG Time: 0018  Rate: 123  Rhythm: sinus tachycardia with occasional PVCs  Axis: normal  Intervals:none  ST&T  Change: none   ____________________________________________  RADIOLOGY  Dg Chest Portable 1 View  Result Date: 06/12/2017 CLINICAL DATA:  Respiratory distress EXAM: PORTABLE CHEST 1 VIEW COMPARISON:  May 29, 2017 FINDINGS: There is a rather minimal left pleural effusion. There is slight left base atelectasis. There is no edema or consolidation. Heart is enlarged with pulmonary vascularity within normal limits. No adenopathy. No evident bone lesions. IMPRESSION: Mild left base atelectasis with minimal left pleural effusion. Lungs elsewhere clear. Stable cardiomegaly. Electronically Signed   By: Lowella Grip III M.D.   On: 06/12/2017 00:45    ____________________________________________   PROCEDURES  Procedure(s) performed: None  Procedures  Critical Care performed: Yes, see critical care note(s)   CRITICAL CARE Performed by: Charlesetta Ivory P   Total critical care time: 30 minutes  Critical care time was exclusive of separately billable procedures and treating other patients.  Critical care was necessary to treat or prevent imminent or life-threatening deterioration.  Critical care was time spent personally by me on the following activities: development of treatment plan with patient and/or surrogate as well as nursing, discussions with consultants, evaluation of patient's response to treatment, examination of patient, obtaining history from patient or surrogate, ordering and performing treatments and interventions, ordering and review of laboratory studies, ordering and review of radiographic studies, pulse oximetry and re-evaluation of patient's condition.   ____________________________________________   INITIAL IMPRESSION / ASSESSMENT AND PLAN / ED COURSE  Pertinent labs & imaging results that were available during my care of the patient were reviewed by me and considered in my medical decision making (see chart for details).  This is a 76 year old female who  comes into the hospital today with some respiratory distress. The patient did receive multiple medications prior to coming in but was still clammy, diaphoretic and tachycardic when she came in. She was still having some significant respiratory distress. We did place the patient on BiPAP when she initially arrived. I also gave the patient another albuterol neb while checking her blood work. The patient's chest x-ray shows some atelectasis in her left base  but she does have white blood cell count of 26,000. I'm concerned for possible pneumonia. The patient was admitted almost 2 weeks ago. I will admit the patient to the hospitalist service for further evaluation of her symptoms.      ____________________________________________   FINAL CLINICAL IMPRESSION(S) / ED DIAGNOSES  Final diagnoses:  COPD exacerbation ([place])  Elevated troponin  Acute renal insufficiency  Nosocomial pneumonia      NEW MEDICATIONS STARTED DURING THIS VISIT:  New Prescriptions   No medications on file     Note:  This document was prepared using Dragon voice recognition software and may include unintentional dictation errors.    Loney Hering, MD 06/12/17 [id number]

## 2017-06-12 NOTE — ED Notes (Signed)
XR at bedside

## 2017-06-12 NOTE — H&P (Signed)
History and Physical   [facility] - [facility] @ [facility] Admission History and Physical [employer], D.O.    Patient Name: Angela Pham MR#: [id number] Date of Birth: 02-15-1941 Date of Admission: 06/11/2017  Referring MD/NP/PA: Dr. Dahlia Client Primary Care Physician: Center, [facility]  Chief Complaint:  Chief Complaint  Patient presents with  . Respiratory Distress    HPI: Angela Pham is a 76 y.o. female with a known history of asthma, CHF, hypertension, hyperlipidemia, diabetes presents to the emergency department for evaluation of shortness of breath.  Patient was in a usual state of health until about two months ago when she describes the onset of shortness of breath which has progressively worsened. Of note she was admitted to the hospital for an asthma exacerbation as well as pneumonia about 2 weeks ago. She was discharged home on a tapering dose of steroids but no home O2. Her symptoms are refractory to home bronchodilators. EmS reports sats in the 49s.  Patient denies fevers/chills, weakness, dizziness, chest pain, N/V/C/D, abdominal pain, dysuria/frequency, changes in mental status.    Otherwise there has been no change in status. Patient has been taking medication as prescribed and there has been no recent change in medication or diet.  There has been no recent travel or sick contacts.     EMS/ED Course: patient received 125 mg Solu-Medrol, 2 g of magnesium sulfate and 3 DuoNeb treatments by EMS. In the emergency room, she was placed on BiPAP, rec'd another albuterol, Zosyn, Vanco and is symptomatically improving.. Medical admission has been requested for further management of asthma/COPD exacerbation and healthcare associated pneumonia..  Review of Systems:  CONSTITUTIONAL: No fever/chills, fatigue, weakness, weight gain/loss, headache. EYES: No blurry or double vision. ENT: No tinnitus, postnasal drip, redness or soreness of the  oropharynx. RESPIRATORY: Positive cough, dyspnea, wheeze.  No hemoptysis.  CARDIOVASCULAR: No chest pain, palpitations, syncope, orthopnea. No lower extremity edema.  GASTROINTESTINAL: No nausea, vomiting, abdominal pain, diarrhea, constipation.  No hematemesis, melena or hematochezia. GENITOURINARY: No dysuria, frequency, hematuria. ENDOCRINE: No polyuria or nocturia. No heat or cold intolerance. HEMATOLOGY: No anemia, bruising, bleeding. INTEGUMENTARY: No rashes, ulcers, lesions. MUSCULOSKELETAL: No arthritis, gout, dyspnea. NEUROLOGIC: No numbness, tingling, ataxia, seizure-type activity, weakness. PSYCHIATRIC: No anxiety, depression, insomnia.   Past Medical History:  Diagnosis Date  . Asthma   . CHF (congestive heart failure) ([place])   . Hyperlipidemia   . Hypertension   . Morbid obesity ([place])   . Type II diabetes mellitus ([place])     Past Surgical History:  Procedure Laterality Date  . c-section    . RIGHT/LEFT HEART CATH AND CORONARY ANGIOGRAPHY N/A 03/09/2017   Procedure: Right/Left Heart Cath and Coronary Angiography;  Surgeon: Minna Merritts, MD;  Location: [place] [facility];  Service: Cardiovascular;  Laterality: N/A;     reports that she has never smoked. She has never used smokeless tobacco. She reports that she does not drink alcohol or use drugs.  Allergies  Allergen Reactions  . Ace Inhibitors Anaphylaxis    Tongue swelling  . Shellfish Allergy Hives and Swelling    Swelling around face and mouth     Family History  Problem Relation Age of Onset  . CAD Father   . Colon cancer Sister   . Leukemia Brother   . Throat cancer Brother   . Cervical cancer Sister     Prior to Admission medications   Medication Sig Start Date End Date Taking? Authorizing Provider  ALPRAZolam Duanne Moron) 0.25  MG tablet Take 1 tablet (0.25 mg total) by mouth 3 (three) times daily. 06/02/17   Max Sane, MD  aspirin EC 81 MG EC tablet Take 1 tablet (81 mg total) by mouth daily.  03/12/17   [name], MD  atorvastatin (LIPITOR) 40 MG tablet Take 40 mg by mouth daily.    [provider]  ferrous sulfate 325 (65 FE) MG tablet Take 1 tablet (325 mg total) by mouth 2 (two) times daily with a meal. 05/08/17   Vaughan Basta, MD  furosemide (LASIX) 40 MG tablet Take 1 tablet (40 mg total) by mouth daily. 03/13/17   [name], MD  hydrALAZINE (APRESOLINE) 25 MG tablet Take 1 tablet (25 mg total) by mouth every 8 (eight) hours. Patient taking differently: Take 50 mg by mouth daily.  05/08/17   Vaughan Basta, MD  INCRUSE ELLIPTA 62.5 MCG/INH AEPB Inhale 1 puff into the lungs daily. 03/16/17   [provider]  Ipratropium-Albuterol (COMBIVENT IN) Inhale 3 puffs into the lungs every 4 (four) hours as needed.    [provider]  ipratropium-albuterol (DUONEB) 0.5-2.5 (3) MG/3ML SOLN Inhale 3 mLs into the lungs every 4 (four) hours as needed.    [provider]  lactulose (CHRONULAC) 10 GM/15ML solution Take 30 g by mouth daily.    [provider]  levocetirizine (XYZAL) 5 MG tablet Take 10 mg by mouth daily. 03/18/17   [provider]  metFORMIN (GLUCOPHAGE) 850 MG tablet Take 1 tablet by mouth 2 (two) times daily.    [provider]  mirtazapine (REMERON) 45 MG tablet Take 1 tablet by mouth at bedtime.    [provider]  montelukast (SINGULAIR) 10 MG tablet Take 1 tablet (10 mg total) by mouth at bedtime. 03/11/17   [name], MD  Multiple Vitamins-Minerals (EYE VITAMINS PO) Take 1 tablet by mouth daily.    [provider]  predniSONE (STERAPRED UNI-PAK 21 TAB) 10 MG (21) TBPK tablet Start 60 mg once daily, taper 10 mg daily until done 06/02/17   Max Sane, MD  tiotropium (SPIRIVA) 18 MCG inhalation capsule Place 1 capsule (18 mcg total) into inhaler and inhale daily. Patient not taking: Reported on 05/04/2017 03/12/17   [name], MD  verapamil (CALAN-SR) 180 MG CR tablet Take 2 tablets  (360 mg total) by mouth daily. 03/12/17 04/10/17  [name], MD    Physical Exam: Vitals:   06/12/17 0011 06/12/17 0012  BP: (!) 159/95   Pulse: (!) 134   Resp: 20   SpO2: 99%   Weight:  86.3 kg (190 lb 4.1 oz)  Height:  4\' 11"  (1.499 m)   GENERAL: 76 y.o.-year-old female patient, well-developed, well-nourished lying in the bed in no acute distress.  Pleasant and cooperative.   HEENT: Head atraumatic, normocephalic. Pupils equal. Mucus membranes moist. NECK: Supple, full range of motion. No JVD, no bruit heard. No thyroid enlargement, no tenderness, no cervical lymphadenopathy. CHEST: BiPAP in place.  Decreased air movement.  Diffuse respiratory wheezes and diminished breath sounds at the bases No use of accessory muscles of respiration.  No reproducible chest wall tenderness.  CARDIOVASCULAR: S1, S2 normal. No murmurs, rubs, or gallops. Cap refill <2 seconds. Pulses intact distally.  ABDOMEN: Soft, nondistended, nontender. No rebound, guarding, rigidity. Normoactive bowel sounds present in all four quadrants.  EXTREMITIES: No pedal edema, cyanosis, or clubbing. No calf tenderness or Homan's sign.  NEUROLOGIC: The patient is alert and oriented x 3. Cranial nerves II through XII  are grossly intact with no focal sensorimotor deficit. PSYCHIATRIC:  Normal affect, mood, thought content. SKIN: Warm, dry, and intact without obvious rash, lesion, or ulcer.    Labs on Admission:  CBC:  Recent Labs Lab 06/12/17 0004  WBC 26.8*  HGB 11.3*  HCT 34.7*  MCV 90.4  PLT 562   Basic Metabolic Panel:  Recent Labs Lab 06/12/17 0004  NA 145  K 4.5  CL 105  CO2 27  GLUCOSE 195*  BUN 65*  CREATININE 2.28*  CALCIUM 9.1   GFR: Estimated Creatinine Clearance: 21 mL/min (A) (by C-G formula based on SCr of 2.28 mg/dL (H)). Liver Function Tests: No results for input(s): AST, ALT, ALKPHOS, BILITOT, PROT, ALBUMIN in the last 168 hours. No results for input(s): LIPASE, AMYLASE in the last  168 hours. No results for input(s): AMMONIA in the last 168 hours. Coagulation Profile: No results for input(s): INR, PROTIME in the last 168 hours. Cardiac Enzymes:  Recent Labs Lab 06/12/17 0004  TROPONINI 0.10*   BNP (last 3 results) No results for input(s): PROBNP in the last 8760 hours. HbA1C: No results for input(s): HGBA1C in the last 72 hours. CBG: No results for input(s): GLUCAP in the last 168 hours. Lipid Profile: No results for input(s): CHOL, HDL, LDLCALC, TRIG, CHOLHDL, LDLDIRECT in the last 72 hours. Thyroid Function Tests: No results for input(s): TSH, T4TOTAL, FREET4, T3FREE, THYROIDAB in the last 72 hours. Anemia Panel: No results for input(s): VITAMINB12, FOLATE, FERRITIN, TIBC, IRON, RETICCTPCT in the last 72 hours. Urine analysis: No results found for: COLORURINE, APPEARANCEUR, LABSPEC, PHURINE, GLUCOSEU, HGBUR, BILIRUBINUR, KETONESUR, PROTEINUR, UROBILINOGEN, NITRITE, LEUKOCYTESUR Sepsis Labs: @LABRCNTIP (procalcitonin:4,lacticidven:4) )No results found for this or any previous visit (from the past 240 hour(s)).   Radiological Exams on Admission: Dg Chest Portable 1 View  Result Date: 06/12/2017 CLINICAL DATA:  Respiratory distress EXAM: PORTABLE CHEST 1 VIEW COMPARISON:  May 29, 2017 FINDINGS: There is a rather minimal left pleural effusion. There is slight left base atelectasis. There is no edema or consolidation. Heart is enlarged with pulmonary vascularity within normal limits. No adenopathy. No evident bone lesions. IMPRESSION: Mild left base atelectasis with minimal left pleural effusion. Lungs elsewhere clear. Stable cardiomegaly. Electronically Signed   By: Lowella Grip III M.D.   On: 06/12/2017 00:45    EKG: Sinus tachycardia at 147bpm with normal axis and nonspecific ST-T wave changes.   Assessment/Plan  This is a 76 y.o. female with a history of asthma/COPD, CHF, hypertension, hyperlipidemia, diabetes now being admitted with:  #.  Acute hypoxic respiratory failure 2/2 COPD/Asthma exacerbation, possible HCAP -Admit to stepdown for BiPAP therapy. -Continue nebulizers, O2 and tapering steroids -Continue Zosyn, Vanco started in ED. Follow up sputum cultures.  -Continue ellipta, Xyzal, Singulair -Expectorant as needed -CCM consulted  #. Elevated troponin, likely demand ischemia.  rule out ACS - Telemetry monitoring. - Trend troponins  #. Acute kidney injury  - Repeat BMP in AM.  - Avoid nephrotoxic medications - Bladder scan and place foley catheter if evidence of urinary retention   #. History of CHF Continue Lasix  #. History of hypertension - Continue hydralazine, Lasix  #. History of hyperlipidemia - Continue Lipitor  #. History of anemia - Continue iron  #. History of diabetes - Continue regular insulin sliding scale old metformin  Admission status: inpatient IV Fluids: Hep-Lock Diet/Nutrition: heart healthy, carb controlled  Consults called: None DVT Px: Lovenox, SCDs and early ambulation. Code Status: DNR Disposition Plan: To home in 1-2 days  All the records are reviewed and case discussed with ED provider. Management plans discussed with the patient and/or family who express understanding and agree with plan of care.  Amen Dargis D.O. on 06/12/2017 at 1:02 AM Between 7am to 6pm - Pager - [phone] After 6pm go to [email or web address] - Marketing executive [place] [facility] Office [phone] CC: Primary care physician; [facility], [facility]   06/12/2017, 1:02 AM

## 2017-06-12 NOTE — ED Notes (Signed)
RT has no one available to go with me to take patient to room.  CCU nurse aware.

## 2017-06-13 ENCOUNTER — Inpatient Hospital Stay: Payer: Medicare Other

## 2017-06-13 ENCOUNTER — Inpatient Hospital Stay
Admit: 2017-06-13 | Discharge: 2017-06-13 | Disposition: A | Discharge status: Home / Self Care | Payer: Medicare Other | Attending: Pulmonary Disease | Admitting: Pulmonary Disease

## 2017-06-13 DIAGNOSIS — J9622 Acute and chronic respiratory failure with hypercapnia: Secondary | ICD-10-CM

## 2017-06-13 DIAGNOSIS — J9621 Acute and chronic respiratory failure with hypoxia: Secondary | ICD-10-CM

## 2017-06-13 LAB — LACTIC ACID, PLASMA: Lactic Acid, Venous: 1.2 mmol/L (ref 0.5–1.9)

## 2017-06-13 LAB — COMPREHENSIVE METABOLIC PANEL
ALT: 182 U/L — AB (ref 14–54)
AST: 51 U/L — AB (ref 15–41)
Albumin: 2.9 g/dL — ABNORMAL LOW (ref 3.5–5.0)
Alkaline Phosphatase: 64 U/L (ref 38–126)
Anion gap: 8 (ref 5–15)
BUN: 57 mg/dL — AB (ref 6–20)
CHLORIDE: 108 mmol/L (ref 101–111)
CO2: 25 mmol/L (ref 22–32)
CREATININE: 1.78 mg/dL — AB (ref 0.44–1.00)
Calcium: 8.1 mg/dL — ABNORMAL LOW (ref 8.9–10.3)
GFR calc Af Amer: 31 mL/min — ABNORMAL LOW (ref >60)
GFR calc non Af Amer: 27 mL/min — ABNORMAL LOW (ref >60)
Glucose, Bld: 165 mg/dL — ABNORMAL HIGH (ref 65–99)
POTASSIUM: 4.1 mmol/L (ref 3.5–5.1)
SODIUM: 141 mmol/L (ref 135–145)
Total Bilirubin: 0.6 mg/dL (ref 0.3–1.2)
Total Protein: 5.9 g/dL — ABNORMAL LOW (ref 6.5–8.1)

## 2017-06-13 LAB — CBC
HCT: 31.4 % — ABNORMAL LOW (ref 35.0–47.0)
Hemoglobin: 10.5 g/dL — ABNORMAL LOW (ref 12.0–16.0)
MCH: 30 pg (ref 26.0–34.0)
MCHC: 33.3 g/dL (ref 32.0–36.0)
MCV: 89.9 fL (ref 80.0–100.0)
Platelets: 263 10*3/uL (ref 150–440)
RBC: 3.49 MIL/uL — AB (ref 3.80–5.20)
RDW: 16.9 % — AB (ref 11.5–14.5)
WBC: 22.7 10*3/uL — AB (ref 3.6–11.0)

## 2017-06-13 LAB — GLUCOSE, CAPILLARY
GLUCOSE-CAPILLARY: 135 mg/dL — AB (ref 65–99)
GLUCOSE-CAPILLARY: 234 mg/dL — AB (ref 65–99)
Glucose-Capillary: 155 mg/dL — ABNORMAL HIGH (ref 65–99)
Glucose-Capillary: 225 mg/dL — ABNORMAL HIGH (ref 65–99)
Glucose-Capillary: 102 mg/dL — ABNORMAL HIGH (ref 65–99)

## 2017-06-13 LAB — PROCALCITONIN: PROCALCITONIN: 0.29 ng/mL

## 2017-06-13 MED ORDER — ALBUTEROL SULFATE (2.5 MG/3ML) 0.083% IN NEBU
2.5000 mg | INHALATION_SOLUTION | RESPIRATORY_TRACT | Status: DC | PRN
  Administered 2017-06-15: 2.5 mg via RESPIRATORY_TRACT
  Filled 2017-06-13: qty 3

## 2017-06-13 MED ORDER — INSULIN ASPART 100 UNIT/ML ~~LOC~~ SOLN: 0.0000 [IU] | Freq: Every day | SUBCUTANEOUS | Status: DC

## 2017-06-13 MED ORDER — HYDRALAZINE HCL 50 MG PO TABS
25.0000 mg | ORAL_TABLET | Freq: Three times a day (TID) | ORAL | Status: DC
  Administered 2017-06-13 – 2017-06-16 (×10): 25 mg via ORAL
  Filled 2017-06-13 (×10): qty 1

## 2017-06-13 MED ORDER — INSULIN ASPART 100 UNIT/ML ~~LOC~~ SOLN
0.0000 [IU] | Freq: Three times a day (TID) | SUBCUTANEOUS | Status: DC
  Administered 2017-06-13 – 2017-06-14 (×2): 5 [IU] via SUBCUTANEOUS
  Administered 2017-06-14: 3 [IU] via SUBCUTANEOUS
  Administered 2017-06-15: 09:00:00 5 [IU] via SUBCUTANEOUS
  Administered 2017-06-15: 18:00:00 8 [IU] via SUBCUTANEOUS
  Administered 2017-06-16 (×2): 2 [IU] via SUBCUTANEOUS
  Filled 2017-06-13 (×7): qty 1

## 2017-06-13 MED ORDER — BUDESONIDE 0.25 MG/2ML IN SUSP
0.2500 mg | Freq: Four times a day (QID) | RESPIRATORY_TRACT | Status: DC
  Administered 2017-06-13 – 2017-06-14 (×4): 0.25 mg via RESPIRATORY_TRACT
  Filled 2017-06-13 (×4): qty 2

## 2017-06-13 MED ORDER — IPRATROPIUM-ALBUTEROL 0.5-2.5 (3) MG/3ML IN SOLN
3.0000 mL | Freq: Four times a day (QID) | RESPIRATORY_TRACT | Status: DC
  Administered 2017-06-13 – 2017-06-14 (×4): 3 mL via RESPIRATORY_TRACT
  Filled 2017-06-13 (×4): qty 3

## 2017-06-13 MED ORDER — METHYLPREDNISOLONE SODIUM SUCC 40 MG IJ SOLR
40.0000 mg | Freq: Two times a day (BID) | INTRAMUSCULAR | Status: DC
  Administered 2017-06-13: 40 mg via INTRAVENOUS
  Filled 2017-06-13 (×2): qty 1

## 2017-06-13 MED ORDER — MAGNESIUM HYDROXIDE 400 MG/5ML PO SUSP
30.0000 mL | Freq: Once | ORAL | Status: AC
Start: 2017-06-13 — End: 2017-06-14
  Administered 2017-06-14: 30 mL via ORAL
  Filled 2017-06-13: qty 30

## 2017-06-13 NOTE — Progress Notes (Signed)
*  PRELIMINARY RESULTS* Echocardiogram 2D Echocardiogram has been performed.  Angela Pham 06/13/2017, 4:19 PM

## 2017-06-13 NOTE — Care Management (Signed)
Met with patient and she agrees to either LTAC offers [facility]).  She asked that I speak with her niece Barnett Applebaum. I contacted Barnett Applebaum regarding patient statement and offered LTAC in [place] ([facility]) and also [facility]. Per [employer], [facility] in [place] is closer to her work. I have notified Erika with [facility] and she will check with [facility].

## 2017-06-13 NOTE — Progress Notes (Signed)
eLink Physician-Brief Progress Note Patient Name: Angela Pham DOB: 1940/12/25 MRN: [id number]   Date of Service  06/13/2017  HPI/Events of Note  Patient requests MOM for constipation.   eICU Interventions  Will order MOM X 1.      Intervention Category Intermediate Interventions: Other:  Lysle Dingwall 06/13/2017, 5:30 PM

## 2017-06-13 NOTE — Progress Notes (Signed)
No overt distress No new complaints Dyspneic with minimal exertion  Vitals:   06/13/17 0824 06/13/17 0900 06/13/17 1148 06/13/17 1200  BP:   (!) 157/81 (!) 141/78  Pulse:  (!) 110  98  Resp:  (!) 23  17  Temp: 97.9 F (36.6 C)   98.4 F (36.9 C)  TempSrc: Oral   Axillary  SpO2:  100%  100%  Weight:      Height:        Alert, oriented HEENT WNL Neck without adenopathy. JVP cannot be assessed Diminished breath sounds, scattered wheezes Distant heart sounds, no murmur Centripetal obesity, abdomen soft, NT 1+ symmetric pretibial edema No focal neurologic deficits  BMP Latest Ref Rng & Units 06/13/2017 06/12/2017 06/12/2017  Glucose 65 - 99 mg/dL 165(H) 190(H) 195(H)  BUN 6 - 20 mg/dL 57(H) 65(H) 65(H)  Creatinine 0.44 - 1.00 mg/dL 1.78(H) 1.87(H) 2.28(H)  Sodium 135 - 145 mmol/L 141 140 145  Potassium 3.5 - 5.1 mmol/L 4.1 4.4 4.5  Chloride 101 - 111 mmol/L 108 107 105  CO2 22 - 32 mmol/L 25 23 27   Calcium 8.9 - 10.3 mg/dL 8.1(L) 8.3(L) 9.1   CBC Latest Ref Rng & Units 06/13/2017 06/12/2017 06/12/2017  WBC 3.6 - 11.0 K/uL 22.7(H) 22.7(H) 26.8(H)  Hemoglobin 12.0 - 16.0 g/dL 10.5(L) 9.9(L) 11.3(L)  Hematocrit 35.0 - 47.0 % 31.4(L) 30.5(L) 34.7(L)  Platelets 150 - 440 K/uL 263 273 379   PCT: 0.21, 0.29  IMPRESSION: Acute on chronic hypoxemic/hypercarbic respiratory failure COPD exacerbation Diastolic heart failure Pulmonary hypertension AKI, nonoliguric CKD - baseline creatinine appears to be 1.5 Minimally elevated PCT - doubt pneumonia Type II DM  PLAN/REC: Continue supplemental oxygen - titrate to keep SPO2 92-97% Decrease systemic steroids Initiate nebulized steroids and bronchodilators Hold Incruse inhaler while on nebulized meds Discontinue montelukast - doubtful that it is offering any benefit Discontinue vancomycin-Zosyn Continue BiPAP as needed during day Continue nocturnal BiPAP Echocardiogram with bubble study ordered  Merton Border, MD PCCM  service Mobile [phone] Pager [phone] 06/13/2017 1:37 PM

## 2017-06-13 NOTE — Progress Notes (Signed)
PT Cancellation Note  Patient Details Name: Angela Pham MRN: [id number] DOB: Jan 29, 1941   Cancelled Treatment:    Reason Eval/Treat Not Completed: Medical issues which prohibited therapy (Evaluation re-attempted.  Per primary RN, patient now weaned off BiPAP,but with significant SOB just resting in bed.  Will hold exertional activity at this time and re-attempt at later time/date as medically appropriate.)  Nina Mondor H. Owens Shark, PT, DPT, NCS 06/13/17, 10:15 AM [phone]

## 2017-06-13 NOTE — Care Management (Addendum)
RNCM received callback from [facility] with [facility] that patient no longer meets LTAC criteria- now on Nasal cannula and IV antibiotics have stopped. I asked Angela Pham to continue to follow patient for any changes based on patient's medical history- she agreed. I have updated niece Angela Pham. Angela Pham feels that "we are missing everything and that its not her heart its her lungs".  She states that "she discharged the last time and she was still short of breath and wheezing. She did not qualify for NIV or home Oxygen on last visit. She's using nebulizer up to 12 times some days".  I explained that over using nebulizer can cause bronchospasms but she does not feel that is the problem.  Niece would like her to transfer to [facility] hospital if she can. She also states that "(patient) she gets anxious and sometimes says that she don't want to live like this anymore". I asked her if patient was suicidal and she said "yes sometimes she just wants to end it all and I'm afraid she's going to overdose on her medications". She requested a psych consult to evaluate her anxiety.  MD notified. RN notified.

## 2017-06-13 NOTE — Care Management (Addendum)
RNCM consult received for home O2. Per previous assessments patient is not on home O2 and I attempted to get NIV for home however she did not qualify per [facility]. Patient was recently sent home with [facility] health [facility]. They also tried on last visit to get patient to [place] which I will get them to look at her again- both [facility] been advised. RNCM will continue to follow. Fourth presentation in last six month. Corene Cornea with [facility] notified of patient re-admission. If patient needs ICU care through tonight patient will meet insurance requirements to go to LTAC.  RNCM will monitor.

## 2017-06-13 NOTE — Progress Notes (Signed)
[place] [facility] [place] NAME: Angela Pham    MR#:  [id number]  DATE OF BIRTH:  [birth year]  SUBJECTIVE: Admitted for respiratory distress, found to have CHF exacerbation. The patient was here multiple times this year in June, August 8, September 1, discharged on September 6. Shortness of breath is better.not able to come off  BiPAP.  CHIEF COMPLAINT:   Chief Complaint  Patient presents with  . Respiratory Distress    REVIEW OF SYSTEMS:    Review of Systems  Constitutional: Negative for chills and fever.  HENT: Negative for hearing loss.   Eyes: Negative for blurred vision, double vision and photophobia.  Respiratory: Positive for cough, shortness of breath and wheezing. Negative for hemoptysis.   Cardiovascular: Negative for palpitations, orthopnea and leg swelling.  Gastrointestinal: Negative for abdominal pain, diarrhea and vomiting.  Genitourinary: Negative for dysuria and urgency.  Musculoskeletal: Negative for myalgias and neck pain.  Skin: Negative for rash.  Neurological: Negative for dizziness, focal weakness, seizures, weakness and headaches.  Psychiatric/Behavioral: Negative for memory loss. The patient does not have insomnia.     Nutrition:  Tolerating Diet: Tolerating PT:      DRUG ALLERGIES:   Allergies  Allergen Reactions  . Ace Inhibitors Anaphylaxis    Tongue swelling  . Shellfish Allergy Hives and Swelling    Swelling around face and mouth     VITALS:  Blood pressure (!) 141/78, pulse 98, temperature 98.4 F (36.9 C), temperature source Axillary, resp. rate 17, height 4\' 11"  (1.499 m), weight 82.1 kg (181 lb), SpO2 100 %.  PHYSICAL EXAMINATION:   Physical Exam  GENERAL:  76 y.o.-year-old patient lying in the bed with Some respiratory distress. EYES: Pupils equal, round, reactive to light  . No scleral icterus. Extraocular muscles intact.  HEENT: Head atraumatic, normocephalic. Oropharynx and  nasopharynx clear. She has a BiPAP mask on but able to communicate. NECK:  Supple, no jugular venous distention. No thyroid enlargement, no tenderness.  LUNGS faint wheezing bilaterally. CARDIOVASCULAR: S1, S2 normal. No murmurs, rubs, or gallops.  ABDOMEN: Soft, nontender, nondistended. Bowel sounds present. No organomegaly or mass.  EXTREMITIES: No pedal edema, cyanosis, or clubbing.  NEUROLOGIC: Unable to assess neurologic exam because she is on BiPAP.  PSYCHIATRIC: The patient is alert and oriented x 3.  SKIN: No obvious rash, lesion, or ulcer.    LABORATORY PANEL:   CBC  Recent Labs Lab 06/13/17 0418  WBC 22.7*  HGB 10.5*  HCT 31.4*  PLT 263   ------------------------------------------------------------------------------------------------------------------  Chemistries   Recent Labs Lab 06/12/17 0633 06/13/17 0418  NA 140 141  K 4.4 4.1  CL 107 108  CO2 23 25  GLUCOSE 190* 165*  BUN 65* 57*  CREATININE 1.87* 1.78*  CALCIUM 8.3* 8.1*  MG 2.2  --   AST 199* 51*  ALT 257* 182*  ALKPHOS 74 64  BILITOT 0.6 0.6   ------------------------------------------------------------------------------------------------------------------  Cardiac Enzymes  Recent Labs Lab 06/12/17 2153  TROPONINI 1.33*   ------------------------------------------------------------------------------------------------------------------  RADIOLOGY:  Dg Chest Port 1 View  Result Date: 06/13/2017 CLINICAL DATA:  Acute respiratory failure EXAM: PORTABLE CHEST 1 VIEW COMPARISON:  06/12/2017 FINDINGS: Cardiomegaly with vascular congestion. Bibasilar atelectasis, similar to prior study. Possible small effusions. No overt edema. No acute bony abnormality. IMPRESSION: Cardiomegaly with vascular congestion and bibasilar atelectasis. Question small effusions. No real change. Electronically Signed   By: Rolm Baptise M.D.   On: 06/13/2017 07:32   Dg Chest  Portable 1 View  Result Date:  06/12/2017 CLINICAL DATA:  Respiratory distress EXAM: PORTABLE CHEST 1 VIEW COMPARISON:  May 29, 2017 FINDINGS: There is a rather minimal left pleural effusion. There is slight left base atelectasis. There is no edema or consolidation. Heart is enlarged with pulmonary vascularity within normal limits. No adenopathy. No evident bone lesions. IMPRESSION: Mild left base atelectasis with minimal left pleural effusion. Lungs elsewhere clear. Stable cardiomegaly. Electronically Signed   By: Lowella Grip III M.D.   On: 06/12/2017 00:45     ASSESSMENT AND PLAN:   Active Problems:   Acute respiratory failure with hypoxia (HCC)   Pressure injury of skin  Recurrent respiratory failure: Admitted 3 times this year already. Discharge 2 weeks ago that time she had pneumonia and COPD flares. Patient comes back again with shortness of breath. #1 acute respiratory failure and chronic respiratory failure with hypoxia, O2 saturation when EMS went to her home were 60%.; Continue BiPAP support, continue bronchodilators, empiric antibiotics with vancomycin, Zosyn. Possible left base pneumonia on x-ray Continue IV steroids. Continue bipap. . Patient had a CT and her chest on September 1 which showed no pulmonary embolus. But has centrilobular emphysema. Patient also has dilated pulmonary artery which reflects chronic pulmonary hypertension.   #2. Non-ST elevation MI: Troponin up to 1.32. Troponin elevation likely due to demand ischemia from respiratory distress. Echocardiogram, cardiology consult will be ordered. She had a cardiac catheter in June of this year and had normal coronaries that time. Patient had elevated right atrial pressures. Pulmonary artery pressure is increased up to 55-60 mm. Her symptoms of shortness of breath may be coming from pulmonary hypertension also...  #3. Acute on chronic renal failure: Patient creatinine 2.28 yesterday to date is 1.87 . #4 .sepsis. Elevated lactic acid, acute  renal failure, WBC on admission. Follow culture data, continue IV antibiotics. 5.essential hypertension: Controlled.   All the records are reviewed and case discussed with Care Management/Social Workerr. Management plans discussed with the patient, family and they are in agreement.  CODE STATUS: Full code  TOTAL TIME TAKING CARE OF THIS PATIENT: 35 minutes.   POSSIBLE D/C IN 1-2 DAYS, DEPENDING ON CLINICAL CONDITION.   Epifanio Lesches M.D on 06/13/2017 at 1:07 PM  Between 7am to 6pm - Pager - [phone]  After 6pm go to [email or web address] - password [email or web address] [place] [facility]  Office  [phone]  CC: Primary care physician; Cletis Athens, MD

## 2017-06-14 ENCOUNTER — Inpatient Hospital Stay: Payer: Medicare Other

## 2017-06-14 ENCOUNTER — Encounter: Payer: Self-pay | Admitting: Physician Assistant

## 2017-06-14 DIAGNOSIS — I10 Essential (primary) hypertension: Secondary | ICD-10-CM

## 2017-06-14 DIAGNOSIS — I5033 Acute on chronic diastolic (congestive) heart failure: Secondary | ICD-10-CM

## 2017-06-14 DIAGNOSIS — J441 Chronic obstructive pulmonary disease with (acute) exacerbation: Secondary | ICD-10-CM

## 2017-06-14 DIAGNOSIS — J449 Chronic obstructive pulmonary disease, unspecified: Secondary | ICD-10-CM

## 2017-06-14 DIAGNOSIS — Z9114 Patient's other noncompliance with medication regimen: Secondary | ICD-10-CM

## 2017-06-14 LAB — CBC
HCT: 32.5 % — ABNORMAL LOW (ref 35.0–47.0)
Hemoglobin: 10.6 g/dL — ABNORMAL LOW (ref 12.0–16.0)
MCH: 28.8 pg (ref 26.0–34.0)
MCHC: 32.6 g/dL (ref 32.0–36.0)
MCV: 88.4 fL (ref 80.0–100.0)
PLATELETS: 260 10*3/uL (ref 150–440)
RBC: 3.67 MIL/uL — ABNORMAL LOW (ref 3.80–5.20)
RDW: 16.9 % — AB (ref 11.5–14.5)
WBC: 20 10*3/uL — ABNORMAL HIGH (ref 3.6–11.0)

## 2017-06-14 LAB — PROCALCITONIN: PROCALCITONIN: 0.16 ng/mL

## 2017-06-14 LAB — BASIC METABOLIC PANEL
ANION GAP: 8 (ref 5–15)
BUN: 52 mg/dL — ABNORMAL HIGH (ref 6–20)
CALCIUM: 8 mg/dL — AB (ref 8.9–10.3)
CO2: 28 mmol/L (ref 22–32)
CREATININE: 1.56 mg/dL — AB (ref 0.44–1.00)
Chloride: 107 mmol/L (ref 101–111)
GFR calc Af Amer: 37 mL/min — ABNORMAL LOW (ref >60)
GFR, EST NON AFRICAN AMERICAN: 32 mL/min — AB (ref >60)
GLUCOSE: 182 mg/dL — AB (ref 65–99)
Potassium: 3.9 mmol/L (ref 3.5–5.1)
Sodium: 143 mmol/L (ref 135–145)

## 2017-06-14 LAB — GLUCOSE, CAPILLARY
GLUCOSE-CAPILLARY: 231 mg/dL — AB (ref 65–99)
GLUCOSE-CAPILLARY: 175 mg/dL — AB (ref 65–99)
Glucose-Capillary: 190 mg/dL — ABNORMAL HIGH (ref 65–99)
Glucose-Capillary: 182 mg/dL — ABNORMAL HIGH (ref 65–99)
Glucose-Capillary: 115 mg/dL — ABNORMAL HIGH (ref 65–99)

## 2017-06-14 MED ORDER — VERAPAMIL HCL ER 180 MG PO TBCR
180.0000 mg | EXTENDED_RELEASE_TABLET | Freq: Every day | ORAL | Status: DC
  Filled 2017-06-14: qty 1

## 2017-06-14 MED ORDER — MOMETASONE FURO-FORMOTEROL FUM 200-5 MCG/ACT IN AERO
2.0000 | INHALATION_SPRAY | Freq: Two times a day (BID) | RESPIRATORY_TRACT | Status: DC
  Administered 2017-06-14 – 2017-06-16 (×5): 2 via RESPIRATORY_TRACT
  Filled 2017-06-14: qty 8.8

## 2017-06-14 MED ORDER — VERAPAMIL HCL ER 180 MG PO CP24: 180.0000 mg | ORAL_CAPSULE | Freq: Every morning | ORAL | Status: DC

## 2017-06-14 MED ORDER — PREDNISONE 20 MG PO TABS
40.0000 mg | ORAL_TABLET | Freq: Every day | ORAL | Status: DC
  Administered 2017-06-15 – 2017-06-16 (×2): 40 mg via ORAL
  Filled 2017-06-14 (×2): qty 2

## 2017-06-14 MED ORDER — TIOTROPIUM BROMIDE MONOHYDRATE 18 MCG IN CAPS
18.0000 ug | ORAL_CAPSULE | Freq: Every day | RESPIRATORY_TRACT | Status: DC
  Administered 2017-06-14 – 2017-06-16 (×3): 18 ug via RESPIRATORY_TRACT
  Filled 2017-06-14 (×2): qty 5

## 2017-06-14 MED ORDER — SPIRONOLACTONE 25 MG PO TABS
12.5000 mg | ORAL_TABLET | Freq: Every day | ORAL | Status: DC
  Administered 2017-06-14 – 2017-06-16 (×3): 12.5 mg via ORAL
  Filled 2017-06-14 (×3): qty 1

## 2017-06-14 MED ORDER — FUROSEMIDE 10 MG/ML IJ SOLN
40.0000 mg | Freq: Two times a day (BID) | INTRAMUSCULAR | Status: DC
  Administered 2017-06-14 – 2017-06-16 (×4): 40 mg via INTRAVENOUS
  Filled 2017-06-14 (×5): qty 4

## 2017-06-14 NOTE — Consult Note (Signed)
Cardiology Consultation Note  Patient ID: Angela Pham, MRN: [id number], DOB/AGE: 74-06-1941 74 y.o. Admit date: 06/11/2017   Date of Consult: 06/14/2017 Primary Physician: Cletis Athens, MD Primary Cardiologist: Dr. Fletcher Anon, MD Requesting Physician: Dr. Alva Garnet, MD  Chief Complaint: SOB/wheezing Reason for Consult: Cardiomyopathy   HPI: Angela Pham is a 76 y.o. female who is being seen today for the evaluation of cardiomyopathy at the request of Dr. Alva Garnet, MD. Patient has a h/o chronic combined CHF, pulmonary hypertension, pulmonary edema, reported asthma since childhood with frequent nebulizer usage, HTN, DM2 noncompliance, and HLD who was admitted to [facility] on 9/16 with increased SOB and wheezing.   Patient recently admitted to [facility] in June 2018 after relocating to [facility] ~ 1 year prior to that. She underwent TTE on 03/08/2017 that showed EF of 45-50%, basal-anteroseptal HK, poorly visulaized aortic valve that was possibly bicuspid, mild AI, moderate MR directed posteriorly, mildly dilated left atrium, RV systolic function was normal, PASP moderately increased at 55-60 mmHg. She was diuresed and underwent R/LHC that showed normal coronary arteries with LVDEP 38-43, RA mean 22, RV 53/18/22, unable to advance the catheter to wedge pressure. She cancelled both her hospital follow up appointments with cardiology and CHF clinic.   She was admitted in early August for same symptoms. Treated with IV steroids, ABX for possible PNA, and diuresed. She was admitted for a 3rd time in early September for same symptoms, treated as AECOPD.   She was readmitted on 9/16 with continued SOB. She reports "I live on my breathing machine." She continues to note wheezing, coughing, and lower extremity swelling. No chest pain. She is not certain on her medication regimen though states, "I'm over medicated." Since her admission, she has been continued on nebs, steroids, and briefly ABX. She underwent repeat limited echo that  showed EF 35-40%, mild AI, moderate MR, left atrium mildly dilated, no evidence of right-to-left atrial shunt. She has been continued on low dose PO Lasix 40 mg daily. Renal function has improved with gentle diuresis. She has remained on supplemental oxygen. Feels like her breathing is improved from presentation.    Past Medical History:  Diagnosis Date  . Asthma   . CHF (congestive heart failure) ([place])   . Hyperlipidemia   . Hypertension   . Morbid obesity ([place])   . Type II diabetes mellitus ([place])       Most Recent Cardiac Studies: [facility] 02/2017: Coronary angiography:  Coronary dominance: Right  Left mainstem: Large vessel that bifurcates into the LAD and left circumflex, no significant disease noted  Left anterior descending (LAD): Large vessel that extends to the apical region, diagonal branch 2 of moderate size, no significant disease noted  Left circumflex (LCx): Large vessel with OM branch 2, no significant disease noted  Right coronary artery (RCA): Right dominant vessel with PL and PDA, no significant disease noted  Left ventriculography: Not performed as she had recent echo and has underlying CRI  Final Conclusions:  Normal coronary arteries  Right heart pressures: RA: mean 22 RV: 53/18/22 RVEDP: 22 LVEDP 38-43 Unable to advance catheter to wedge pressure despite multiple attempts   Recommendations:  Normal coronary arteries Wall motion secondary to LBBB Markedly elevated LVEDP Elevated right atrial pressures Numbers consistent with chronic diastolic CHF Blood pressure markedly elevated , tachycardic Will D/c amlodipine, start verapamil to assist with rate control  TTE 02/2017: Study Conclusions  - Left ventricle: The cavity size was at the upper limits of   normal. Wall thickness  was increased in a pattern of mild LVH.   Systolic function was mildly reduced. The estimated ejection   fraction was in the range of 45% to 50%. There is hypokinesis of    the basal-midanteroseptal myocardium. - Aortic valve: Poorly visualized. A bicuspid morphology cannot be   excluded. There was mild regurgitation. - Mitral valve: Calcified annulus. Mildly thickened leaflets .   Leaflet separation was mildly reduced. There was moderate   regurgitation directed posteriorly. - Left atrium: The atrium was mildly dilated. - Right ventricle: The cavity size was normal. Wall thickness was   normal. Systolic function was normal. - Pulmonary arteries: Systolic pressure was moderately increased,   in the range of 55 mm Hg to 60 mm Hg. - Pericardium, extracardiac: A trivial pericardial effusion was   identified posterior to the heart.  TTE with bubble study 05/2017: Study Conclusions  - Left ventricle: Systolic function was moderately reduced. The   estimated ejection fraction was in the range of 35% to 40%. - Aortic valve: There was mild regurgitation. - Mitral valve: There was moderate regurgitation. - Left atrium: The atrium was mildly dilated. - Atrial septum: Echo contrast study showed no right-to-left atrial   level shunt, at baseline or with provocation.   Surgical History:  Past Surgical History:  Procedure Laterality Date  . c-section    . RIGHT/LEFT HEART CATH AND CORONARY ANGIOGRAPHY N/A 03/09/2017   Procedure: Right/Left Heart Cath and Coronary Angiography;  Surgeon: Minna Merritts, MD;  Location: [place] [facility];  Service: Cardiovascular;  Laterality: N/A;     Home Meds: Prior to Admission medications   Medication Sig Start Date End Date Taking? Authorizing Provider  ALPRAZolam (XANAX) 0.25 MG tablet Take 1 tablet (0.25 mg total) by mouth 3 (three) times daily. 06/02/17  Yes Max Sane, MD  aspirin EC 81 MG EC tablet Take 1 tablet (81 mg total) by mouth daily. 03/12/17  Yes Mody, Ulice Bold, MD  atorvastatin (LIPITOR) 40 MG tablet Take 40 mg by mouth daily.   Yes [provider]  furosemide (LASIX) 40 MG tablet Take 1 tablet (40 mg  total) by mouth daily. 03/13/17  Yes [name], MD  hydrALAZINE (APRESOLINE) 25 MG tablet Take 1 tablet (25 mg total) by mouth every 8 (eight) hours. Patient taking differently: Take 50 mg by mouth daily.  05/08/17  Yes Vaughan Basta, MD  INCRUSE ELLIPTA 62.5 MCG/INH AEPB Inhale 1 puff into the lungs daily. 03/16/17  Yes [provider]  Ipratropium-Albuterol (COMBIVENT IN) Inhale 3 puffs into the lungs every 4 (four) hours as needed.   Yes [provider]  ipratropium-albuterol (DUONEB) 0.5-2.5 (3) MG/3ML SOLN Inhale 3 mLs into the lungs every 4 (four) hours as needed.   Yes [provider]  lactulose (CHRONULAC) 10 GM/15ML solution Take 30 g by mouth daily.   Yes [provider]  levocetirizine (XYZAL) 5 MG tablet Take 10 mg by mouth daily. 03/18/17  Yes [provider]  metFORMIN (GLUCOPHAGE) 850 MG tablet Take 1 tablet by mouth 2 (two) times daily.   Yes [provider]  mirtazapine (REMERON) 45 MG tablet Take 1 tablet by mouth at bedtime.   Yes [provider]  montelukast (SINGULAIR) 10 MG tablet Take 1 tablet (10 mg total) by mouth at bedtime. 03/11/17  Yes Mody, Ulice Bold, MD  Multiple Vitamins-Minerals (EYE VITAMINS PO) Take 1 tablet by mouth daily.   Yes [provider]  predniSONE (DELTASONE) 10 MG tablet Take 10 mg  by mouth as directed.   Yes [provider]  verapamil (VERELAN PM) 180 MG 24 hr capsule Take 180 mg by mouth every morning.   Yes [provider]  ferrous sulfate 325 (65 FE) MG tablet Take 1 tablet (325 mg total) by mouth 2 (two) times daily with a meal. Patient not taking: Reported on 06/12/2017 05/08/17   Vaughan Basta, MD    Inpatient Medications:  . aspirin EC  81 mg Oral Daily  . atorvastatin  40 mg Oral Daily  . enoxaparin (LOVENOX) injection  30 mg Subcutaneous Q24H  . ferrous sulfate  325 mg Oral BID WC  . furosemide  40 mg Oral Daily  . hydrALAZINE  25 mg Oral  Q8H  . Influenza vac split quadrivalent PF  0.5 mL Intramuscular Tomorrow-1000  . insulin aspart  0-15 Units Subcutaneous TID WC  . insulin aspart  0-5 Units Subcutaneous QHS  . lactulose  30 g Oral Once per day on Tue Thu Sat  . mirtazapine  45 mg Oral QHS  . mometasone-formoterol  2 puff Inhalation BID  . pneumococcal 23 valent vaccine  0.5 mL Intramuscular Tomorrow-1000  . [START ON 06/15/2017] predniSONE  40 mg Oral Q breakfast  . sodium chloride flush  3 mL Intravenous Q12H  . tiotropium  18 mcg Inhalation Daily  . verapamil  180 mg Oral Daily   . sodium chloride Stopped (06/13/17 1759)    Allergies:  Allergies  Allergen Reactions  . Ace Inhibitors Anaphylaxis    Tongue swelling  . Shellfish Allergy Hives and Swelling    Swelling around face and mouth     Social History   Social History  . Marital status: Widowed    Spouse name: N/A  . Number of children: N/A  . Years of education: N/A   Occupational History  . Not on file.   Social History Main Topics  . Smoking status: Never Smoker  . Smokeless tobacco: Never Used  . Alcohol use No  . Drug use: No  . Sexual activity: No   Other Topics Concern  . Not on file   Social History Narrative  . No narrative on file     Family History  Problem Relation Age of Onset  . CAD Father   . Colon cancer Sister   . Leukemia Brother   . Throat cancer Brother   . Cervical cancer Sister      Review of Systems: Review of Systems  Constitutional: Positive for malaise/fatigue. Negative for chills, diaphoresis, fever and weight loss.  HENT: Negative for congestion.   Eyes: Negative for discharge and redness.  Respiratory: Positive for cough, shortness of breath and wheezing. Negative for hemoptysis and sputum production.   Cardiovascular: Positive for orthopnea and leg swelling. Negative for chest pain, palpitations, claudication and PND.  Gastrointestinal: Negative for abdominal pain, blood in stool, heartburn,  melena, nausea and vomiting.  Genitourinary: Negative for hematuria.  Musculoskeletal: Negative for falls and myalgias.  Skin: Negative for rash.  Neurological: Positive for weakness. Negative for dizziness, tingling, tremors, sensory change, speech change, focal weakness and loss of consciousness.  Endo/Heme/Allergies: Does not bruise/bleed easily.  Psychiatric/Behavioral: Negative for substance abuse. The patient is not nervous/anxious.   All other systems reviewed and are negative.   Labs:  Recent Labs  06/12/17 0633 06/12/17 1156 06/12/17 1743 06/12/17 2153  TROPONINI 1.32* 1.79* 1.64* 1.33*   Lab Results  Component Value Date   WBC 20.0 (H) 06/14/2017   HGB 10.6 (  L) 06/14/2017   HCT 32.5 (L) 06/14/2017   MCV 88.4 06/14/2017   PLT 260 06/14/2017    Recent Labs Lab 06/13/17 0418 06/14/17 0423  NA 141 143  K 4.1 3.9  CL 108 107  CO2 25 28  BUN 57* 52*  CREATININE 1.78* 1.56*  CALCIUM 8.1* 8.0*  PROT 5.9*  --   BILITOT 0.6  --   ALKPHOS 64  --   ALT 182*  --   AST 51*  --   GLUCOSE 165* 182*   No results found for: CHOL, HDL, LDLCALC, TRIG No results found for: DDIMER  Radiology/Studies:  Dg Chest 2 View  Result Date: 05/28/2017 CLINICAL DATA:  Dyspnea EXAM: CHEST  2 VIEW COMPARISON:  05/07/2017 chest radiograph. FINDINGS: Stable cardiomediastinal silhouette with mild cardiomegaly and aortic atherosclerosis. No pneumothorax. No pleural effusion. Mild pulmonary edema. IMPRESSION: Mild congestive heart failure. Electronically Signed   By: Ilona Sorrel M.D.   On: 05/28/2017 16:33   Ct Angio Chest Pe W And/or Wo Contrast  Result Date: 05/28/2017 CLINICAL DATA:  Increasing dyspnea over the past few days with exertion in particular. History of asthma. EXAM: CT ANGIOGRAPHY CHEST WITH CONTRAST TECHNIQUE: Multidetector CT imaging of the chest was performed using the standard protocol during bolus administration of intravenous contrast. Multiplanar CT image  reconstructions and MIPs were obtained to evaluate the vascular anatomy. CONTRAST:  60 cc Isovue 370 IV COMPARISON:  05/28/2017 CXR FINDINGS: Cardiovascular: No acute pulmonary embolus. Aortic atherosclerosis with mild ectasia of the ascending aorta. Cardiomegaly without pericardial effusion or thickening. Coronary arteriosclerosis along the left main, LAD, RCA and circumflex. Mild dilatation of the main pulmonary artery to 3.4 cm consistent with a component of chronic pulmonary hypertension. Mediastinum/Nodes: No enlarged mediastinal, hilar, or axillary lymph nodes. Thyroid gland, trachea, and esophagus demonstrate no significant findings. Lungs/Pleura: Limited by respiratory motion artifacts. There is mild bronchiectasis to both lower lobes and right middle lobe with streaky atelectasis at each lung base. No pneumonic consolidation, effusion or pneumothorax. There appears to be centrilobular emphysema bilaterally. Upper Abdomen: No acute abnormality. Musculoskeletal: No chest wall abnormality. No acute or significant osseous findings. Review of the MIP images confirms the above findings. IMPRESSION: 1. Cardiomegaly with aortic atherosclerosis. 2. Mild centrilobular emphysema. 3. No pulmonary embolus. Mild dilatation of the main pulmonary artery to 3.4 cm which may reflect chronic pulmonary hypertensive change. 4. Coronary arteriosclerosis. Aortic Atherosclerosis (ICD10-I70.0) and Emphysema (ICD10-J43.9). Electronically Signed   By: Ashley Royalty M.D.   On: 05/28/2017 17:54   Dg Chest Port 1 View  Result Date: 06/14/2017 CLINICAL DATA:  Respiratory failure. EXAM: PORTABLE CHEST 1 VIEW COMPARISON:  06/13/2017. FINDINGS: Two cardiomegaly with mild pulmonary venous congestion. Low lung volumes. Mild bibasilar pulmonary edema cannot be excluded . Small bilateral pleural effusions. Similar findings noted on prior exam. IMPRESSION: 1. Cardiomegaly with bilateral pulmonary venous congestion again noted. 2. Low lung  volumes with mild bibasilar atelectasis. Mild bibasilar pulmonary edema cannot be excluded. Small bilateral pleural effusions. No change from prior exam. Electronically Signed   By: Marcello Moores  Register   On: 06/14/2017 06:22   Dg Chest Port 1 View  Result Date: 06/13/2017 CLINICAL DATA:  Acute respiratory failure EXAM: PORTABLE CHEST 1 VIEW COMPARISON:  06/12/2017 FINDINGS: Cardiomegaly with vascular congestion. Bibasilar atelectasis, similar to prior study. Possible small effusions. No overt edema. No acute bony abnormality. IMPRESSION: Cardiomegaly with vascular congestion and bibasilar atelectasis. Question small effusions. No real change. Electronically Signed   By: Lennette Bihari  [place] M.D.   On: 06/13/2017 07:32   Dg Chest Portable 1 View  Result Date: 06/12/2017 CLINICAL DATA:  Respiratory distress EXAM: PORTABLE CHEST 1 VIEW COMPARISON:  May 29, 2017 FINDINGS: There is a rather minimal left pleural effusion. There is slight left base atelectasis. There is no edema or consolidation. Heart is enlarged with pulmonary vascularity within normal limits. No adenopathy. No evident bone lesions. IMPRESSION: Mild left base atelectasis with minimal left pleural effusion. Lungs elsewhere clear. Stable cardiomegaly. Electronically Signed   By: Lowella Grip III M.D.   On: 06/12/2017 00:45   Dg Chest Port 1 View  Result Date: 05/29/2017 CLINICAL DATA:  Asthma and congestive heart failure.  Hypoxia. EXAM: PORTABLE CHEST 1 VIEW COMPARISON:  05/28/2017 chest CT FINDINGS: Mild-to-moderate enlargement of the cardiopericardial silhouette with indistinct pulmonary vasculature and hazy perihilar and basilar opacities favoring progressive pulmonary edema or less likely atypical pneumonia. Thoracic spondylosis. IMPRESSION: 1. Progressive hazy perihilar and basilar opacities favoring edema over atypical pneumonia. Underlying cardiomegaly noted. Electronically Signed   By: Van Clines M.D.   On: 05/29/2017 14:51     EKG: Interpreted by me showed: sinus tachycardia, 123 bpm, occasional PVCs, low voltage, right axis deviation, nonspecific st/t changes Telemetry: Interpreted by me showed: NSR, frequent PVCs  Weights: Filed Weights   06/12/17 0012 06/12/17 0400  Weight: 190 lb 4.1 oz (86.3 kg) 181 lb (82.1 kg)     Physical Exam: Blood pressure 130/72, pulse 93, temperature 98.3 F (36.8 C), temperature source Axillary, resp. rate 15, height 4\' 11"  (1.499 m), weight 181 lb (82.1 kg), SpO2 97 %. Body mass index is 36.56 kg/m. General: Well developed, well nourished, in no acute distress. Head: Normocephalic, atraumatic, sclera non-icteric, no xanthomas, nares are without discharge.  Neck: Negative for carotid bruits. JVD difficult to assess 2/2 body habitus. Lungs: Diminished breath sounds bilaterally with wheezing. Heart: RRR with S1 S2. No murmurs, rubs, or gallops appreciated. Abdomen: Soft, non-tender, non-distended with normoactive bowel sounds. No hepatomegaly. No rebound/guarding. No obvious abdominal masses. Msk:  Strength and tone appear normal for age. Extremities: No clubbing or cyanosis. No edema. Distal pedal pulses are 2+ and equal bilaterally. Neuro: Alert and oriented X 3. No facial asymmetry. No focal deficit. Moves all extremities spontaneously. Psych:  Responds to questions appropriately with a normal affect.    Assessment and Plan:  Active Problems:   Acute respiratory failure with hypoxia (HCC)   Pressure injury of skin    1. Acute on chronic respiratory failure with hypoxia: -Improved -Supportive care  2. Acute on chronic combined CHF/pulmonary hypertension: -She does appear to still be volume up -Perhaps some of her wheezing is cardiac in etiology given minimal improvement in breathing with nebs -Recommend changing PO Lasix to IV Lasix 40 mg bid with KCl repletion as needed and monitoring of renal function -Recommend stopping verapamil given her cardiomyopathy   -Not on an ACEi/ARB given angioedema -Not on spironolactone given CKD -Consider adding Imdur to hydralazine -Recommend tapering of steroids when able as this is only leading to further fluid retention   -Strict Is and Os -Daily weight -CHF education -Compliance advised   3. Elevated troponin: -This is not a NSTEMI -This is demand ischemia from her severe pulmonary hypertension, underlying CKD, and anemia of chronic disease -Patient had a LHC 3 months prior that showed normal coronary arteries -No plans for ischemic evaluation at this time  4. Acute on CKD stage III: -Improving with diuresis    Signed, Thurmond Butts  Aidenjames Heckmann, [place] Pager: [phone] 06/14/2017, 10:13 AM

## 2017-06-14 NOTE — Care Management (Signed)
Per Medicaid patient is followed under [employer]behavior[facility] health services. Patient has Medicare D. I reached out to [facility] [phone]. Spiriva/tiotropium #30.0 (30-days) per Sunday Spillers with [employer] is not covered. They are billing Medicare D. It is listed as non-formulary.

## 2017-06-14 NOTE — Progress Notes (Signed)
[facility] [place] NAME: Angela Pham    MR#:  [id number]  DATE OF BIRTH:  Mar 03, 1941  SUBJECTIVE: Admitted for respiratory distress, found to have CHF exacerbation. The patient was here multiple times this year in June, August 8, September 1, discharged on September 6. Shortness of breath is better. off the BiPAP since this morning. Sitting in chair and she says she is feeling better today. Scheduled to have sleep studies tonight. Being transferred out of ICU.   CHIEF COMPLAINT:   Chief Complaint  Patient presents with  . Respiratory Distress    REVIEW OF SYSTEMS:    Review of Systems  Constitutional: Negative for chills and fever.  HENT: Negative for hearing loss.   Eyes: Negative for blurred vision, double vision and photophobia.  Respiratory: Positive for cough, shortness of breath and wheezing. Negative for hemoptysis.   Cardiovascular: Negative for palpitations, orthopnea and leg swelling.  Gastrointestinal: Negative for abdominal pain, diarrhea and vomiting.  Genitourinary: Negative for dysuria and urgency.  Musculoskeletal: Negative for myalgias and neck pain.  Skin: Negative for rash.  Neurological: Negative for dizziness, focal weakness, seizures, weakness and headaches.  Psychiatric/Behavioral: Negative for memory loss. The patient does not have insomnia.     Nutrition:  Tolerating Diet: Tolerating PT:      DRUG ALLERGIES:   Allergies  Allergen Reactions  . Ace Inhibitors Anaphylaxis    Tongue swelling  . Shellfish Allergy Hives and Swelling    Swelling around face and mouth     VITALS:  Blood pressure 135/88, pulse 93, temperature 98.3 F (36.8 C), temperature source Axillary, resp. rate 15, height 4\' 11"  (1.499 m), weight 82.1 kg (181 lb), SpO2 97 %.  PHYSICAL EXAMINATION:   Physical Exam  GENERAL:  76 y.o.-year-old patient lying in the bed with Some respiratory distress. EYES: Pupils equal,  round, reactive to light  . No scleral icterus. Extraocular muscles intact.  HEENT: Head atraumatic, normocephalic. Oropharynx and nasopharynx clear. She has a BiPAP mask on but able to communicate. NECK:  Supple, no jugular venous distention. No thyroid enlargement, no tenderness.  LUNGS clear to auscultation, no wheezing, no rales. CARDIOVASCULAR: S1, S2 normal. No murmurs, rubs, or gallops.  ABDOMEN: Soft, nontender, nondistended. Bowel sounds present. No organomegaly or mass.  EXTREMITIES: No pedal edema, cyanosis, or clubbing.  NEUROLOGIC: Unable to assess neurologic exam because she is on BiPAP.  PSYCHIATRIC: The patient is alert and oriented x 3.  SKIN: No obvious rash, lesion, or ulcer.    LABORATORY PANEL:   CBC  Recent Labs Lab 06/14/17 0423  WBC 20.0*  HGB 10.6*  HCT 32.5*  PLT 260   ------------------------------------------------------------------------------------------------------------------  Chemistries   Recent Labs Lab 06/12/17 0633 06/13/17 0418 06/14/17 0423  NA 140 141 143  K 4.4 4.1 3.9  CL 107 108 107  CO2 23 25 28   GLUCOSE 190* 165* 182*  BUN 65* 57* 52*  CREATININE 1.87* 1.78* 1.56*  CALCIUM 8.3* 8.1* 8.0*  MG 2.2  --   --   AST 199* 51*  --   ALT 257* 182*  --   ALKPHOS 74 64  --   BILITOT 0.6 0.6  --    ------------------------------------------------------------------------------------------------------------------  Cardiac Enzymes  Recent Labs Lab 06/12/17 2153  TROPONINI 1.33*   ------------------------------------------------------------------------------------------------------------------  RADIOLOGY:  Dg Chest Port 1 View  Result Date: 06/14/2017 CLINICAL DATA:  Respiratory failure. EXAM: PORTABLE CHEST 1 VIEW COMPARISON:  06/13/2017. FINDINGS: Two cardiomegaly  with mild pulmonary venous congestion. Low lung volumes. Mild bibasilar pulmonary edema cannot be excluded . Small bilateral pleural effusions. Similar findings  noted on prior exam. IMPRESSION: 1. Cardiomegaly with bilateral pulmonary venous congestion again noted. 2. Low lung volumes with mild bibasilar atelectasis. Mild bibasilar pulmonary edema cannot be excluded. Small bilateral pleural effusions. No change from prior exam. Electronically Signed   By: Marcello Moores  Register   On: 06/14/2017 06:22   Dg Chest Port 1 View  Result Date: 06/13/2017 CLINICAL DATA:  Acute respiratory failure EXAM: PORTABLE CHEST 1 VIEW COMPARISON:  06/12/2017 FINDINGS: Cardiomegaly with vascular congestion. Bibasilar atelectasis, similar to prior study. Possible small effusions. No overt edema. No acute bony abnormality. IMPRESSION: Cardiomegaly with vascular congestion and bibasilar atelectasis. Question small effusions. No real change. Electronically Signed   By: Rolm Baptise M.D.   On: 06/13/2017 07:32     ASSESSMENT AND PLAN:   Active Problems:   Acute respiratory failure with hypoxia (HCC)   Pressure injury of skin  Recurrent respiratory failure: Admitted 3 times this year already. Discharge 2 weeks ago that time she had pneumonia and COPD flares. Patient comes back again with shortness of breath. #1 acute respiratory failure and chronic respiratory failure with hypoxia, O2 saturation when EMS went to her home were 60%.; Patient is off BiPAP continue nasal cannula.. Patient had a CT and her chest on September 1 which showed no pulmonary embolus. But has centrilobular emphysema. Patient also has dilated pulmonary artery which reflects chronic pulmonary hypertension. Patient is seen by pulmonary, cardiology, scheduled to have sleep studies today  To see if she has sleep apnea.   #2. Non-ST elevation MI: Troponin up to 1.32. Troponin elevation likely due to demand ischemia from respiratory distress. Echocardiogram, cardiology consult will be ordered. She had a cardiac catheter in June of this year and had normal coronaries that time. Patient had elevated right atrial pressures.  Pulmonary artery pressure is increased up to 55-60 mm. Her symptoms of shortness of breath may be coming from pulmonary hypertension also... On IV Lasix as per cardiology,.  #3. Acute on chronic renal failure: Patient creatinine 2.28 yesterday to date is 1.87 . #4 .sepsis. Elevated lactic acid, acute renal failure, WBC on admission. Follow culture data, continue IV antibiotics. 5.essential hypertension: Controlled.stopped verapamil and started on hydralazine.     All the records are reviewed and case discussed with Care Management/Social Workerr. Management plans discussed with the patient, family and they are in agreement.  CODE STATUS: Full code  TOTAL TIME TAKING CARE OF THIS PATIENT: 35 minutes.   POSSIBLE D/C IN 1-2 DAYS, DEPENDING ON CLINICAL CONDITION.   Epifanio Lesches M.D on 06/14/2017 at 2:12 PM  Between 7am to 6pm - Pager - [phone]  After 6pm go to [email or web address] - password [email or web address] [facility]  Office  [phone]  CC: Primary care physician; Cletis Athens, MD

## 2017-06-14 NOTE — Care Management (Signed)
Met with patient to explain why [facility] would now not accept patient based on medical necessity. She understands. PT evaluation pending. She agrees to SNF if needed. She is concerned about going home and ending back up in the hospital. She is now acutely on supplemental O2 sitting in bedside recliner. [facility]- patient updated.  I have contacted patient [place] to check status of insurance denial- [phone]. This is a Research scientist (medical) order medications- I will contact Medicaid and enter note on response. I spoke with [facility] and they will email this RNCM order for outpatient sleep study form per Dr. Alva Garnet request. Over night sleep oximetry to be reassessed. She states last visit it was "done on supplemental O2" however she is not on home O2 and will need study to be done on room air at least 4 hours during sleep.

## 2017-06-14 NOTE — Progress Notes (Signed)
Much improved. No new complaints. No distress  Vitals:   06/13/17 2000 06/13/17 2205 06/14/17 0206 06/14/17 0500  BP: 134/73 130/72    Pulse: 95   93  Resp: (!) 25   15  Temp: 98.3 F (36.8 C)     TempSrc: Axillary     SpO2: 99%  98% 97%  Weight:      Height:        Alert, oriented HEENT WNL Neck without adenopathy. JVP cannot be assessed Diminished breath sounds, No wheezes Distant heart sounds, no murmur Centripetal obesity, abdomen soft, NT Trace symmetric pretibial edema No focal neurologic deficits  BMP Latest Ref Rng & Units 06/14/2017 06/13/2017 06/12/2017  Glucose 65 - 99 mg/dL 182(H) 165(H) 190(H)  BUN 6 - 20 mg/dL 52(H) 57(H) 65(H)  Creatinine 0.44 - 1.00 mg/dL 1.56(H) 1.78(H) 1.87(H)  Sodium 135 - 145 mmol/L 143 141 140  Potassium 3.5 - 5.1 mmol/L 3.9 4.1 4.4  Chloride 101 - 111 mmol/L 107 108 107  CO2 22 - 32 mmol/L 28 25 23   Calcium 8.9 - 10.3 mg/dL 8.0(L) 8.1(L) 8.3(L)   CBC Latest Ref Rng & Units 06/14/2017 06/13/2017 06/12/2017  WBC 3.6 - 11.0 K/uL 20.0(H) 22.7(H) 22.7(H)  Hemoglobin 12.0 - 16.0 g/dL 10.6(L) 10.5(L) 9.9(L)  Hematocrit 35.0 - 47.0 % 32.5(L) 31.4(L) 30.5(L)  Platelets 150 - 440 K/uL 260 263 273   PCT: 0.21, 0.29. 0.16  IMPRESSION: Acute on chronic hypoxemic/hypercarbic respiratory failure COPD (probably chronic obstructive asthma as she has minimal smoking history) Acute bronchospasm, resolved Diastolic heart failure Pulmonary hypertension AKI, nonoliguric - creatinine improving CKD - baseline creatinine appears to be 1.5 Type II DM, adequately controlled  PLAN/REC: Continue supplemental oxygen - titrate to keep SPO2 92-97% Change systemic steroids to PO Transition to an outpatient regimen  Dulera - 2 inhalations twice a day  Spiriva - one inhalation daily  Albuterol as needed Overnight oximetry - this will be performed in an effort to procure nocturnal oxygen Transfer to MedSurg floor I have arranged for outpatient follow-up in 3-4  weeks We discussed her on multidisciplinary rounds this morning  Considering discharge to SNF-rehabilitation facility After transfer, PCCM will sign off. Please call if we can be of further assistance   RECOMMENDED PULMONARY DISCHARGE REGIMEN: ICS/LABA (either Dulera, Symbicort, Breo or Advair) LAMA (Spiriva or Incruse) Albuterol PRN DC montelukast as it has been ineffectual for her Supplemental O2 with sleep if she qualifies for it based on her overnight oximetry   Merton Border, MD PCCM service Mobile [phone] Pager [phone] 06/14/2017 1:30 PM

## 2017-06-14 NOTE — Evaluation (Signed)
Physical Therapy Evaluation Patient Details Name: Angela Pham MRN: [id number] DOB: 24-Sep-1941 Today's Date: 06/14/2017   History of Present Illness  presented to ER secondary to progressive SOB; admitted with acute respiratory failure secondary to COPD/asthma exacerbation, HCAP.  Has required BiPAP since admission, currently weaned to 2L supplemental O2 via [place].  Clinical Impression  Upon evaluation, patient alert and oriented; follows all commands and demonstrates good insight/safety awareness.  Eager for mobility efforts; pleased with functional performance.  Bilat UE/LE strength and ROM grossly symmetrical and WFL; no focal weakness identified.  Able to complete sit/stand, basic transfers and gait (80-100' x2) without assist device, cga/close sup; slightly guarded, but no overt buckling or LOB.  Feels at/near baseline for functional performance.  Did review activity pacing, energy conservation and O2 management; will continue to reinforce throughout stay. Would benefit from skilled PT to address above deficits and promote optimal return to PLOF throughout remaining hospitalization.  Anticipate no formal PT needs upon discharge, but feel patient may benefit from outpatient pulmonary rehab program as needed/tolerated.    Follow Up Recommendations No PT follow up    Equipment Recommendations       Recommendations for Other Services  (outpatient pulmonary rehab )     Precautions / Restrictions Precautions Precautions: Fall Restrictions Weight Bearing Restrictions: No      Mobility  Bed Mobility               General bed mobility comments: seated in recliner beginning/end of treatment session  Transfers Overall transfer level: Needs assistance Equipment used: Rolling walker (2 wheeled) Transfers: Sit to/from Stand Sit to Stand: Supervision         General transfer comment: good LE strength/power; minimal use of UEs required to complete  Ambulation/Gait Ambulation/Gait  assistance: Min guard;Supervision Ambulation Distance (Feet): 80 Feet Assistive device: None       General Gait Details: reciprocal stepping pattern with fair step height/length, fair cadence; no overt buckling or LOB.  Slightly limited trunk rotation and arm swing. Sats >94% on 2L throughout  Stairs            Wheelchair Mobility    Modified Rankin (Stroke Patients Only)       Balance Overall balance assessment: Needs assistance Sitting-balance support: No upper extremity supported;Feet supported Sitting balance-Leahy Scale: Good     Standing balance support: No upper extremity supported Standing balance-Leahy Scale: Fair                               Pertinent Vitals/Pain Pain Assessment: No/denies pain    Home Living Family/patient expects to be discharged to:: Private residence Living Arrangements: Other relatives (niece) Available Help at Discharge: Family;Available PRN/intermittently (niece works outside the home, patient is alone while family works) Type of Home: [employer] Access: Stairs to enter Entrance Stairs-Rails: Can reach Optician, dispensing of Steps: [place]: One level        Prior Function Level of Independence: Independent         Comments: Indep for ADLs, household and limited community mobility without assist device; no home O2.  does not drive (never learned how).     Hand Dominance        Extremity/Trunk Assessment   Upper Extremity Assessment Upper Extremity Assessment: Overall WFL for tasks assessed    Lower Extremity Assessment Lower Extremity Assessment: Overall WFL for tasks assessed (grossly at least 4+/5 throughout)  Communication   Communication: No difficulties  Cognition Arousal/Alertness: Awake/alert Behavior During Therapy: WFL for tasks assessed/performed Overall Cognitive Status: Within Functional Limits for tasks assessed                                         General Comments      Exercises Other Exercises Other Exercises: Educated in energy conservation, activity pacing, O2 management; patient voiced understanding/agreement.  Will continue to reinforce throughout stay.   Assessment/Plan    PT Assessment Patient needs continued PT services  PT Problem List Decreased strength;Decreased activity tolerance;Cardiopulmonary status limiting activity       PT Treatment Interventions Gait training;Therapeutic exercise    PT Goals (Current goals can be found in the Care Plan section)  Acute Rehab PT Goals Patient Stated Goal: to go home PT Goal Formulation: With patient Time For Goal Achievement: 06/28/17 Potential to Achieve Goals: Good    Frequency Min 2X/week   Barriers to discharge        Co-evaluation               AM-PAC PT "6 Clicks" Daily Activity  Outcome Measure Difficulty turning over in bed (including adjusting bedclothes, sheets and blankets)?: None Difficulty moving from lying on back to sitting on the side of the bed? : None Difficulty sitting down on and standing up from a chair with arms (e.g., wheelchair, bedside commode, etc,.)?: None Help needed moving to and from a bed to chair (including a wheelchair)?: None Help needed walking in hospital room?: None Help needed climbing 3-5 steps with a railing? : A Little 6 Click Score: 23    End of Session Equipment Utilized During Treatment: Gait belt;Oxygen Activity Tolerance: Patient tolerated treatment well Patient left: in chair;with call bell/phone within reach Nurse Communication: Mobility status PT Visit Diagnosis: Muscle weakness (generalized) (M62.81)    Time: 6160-7371 PT Time Calculation (min) (ACUTE ONLY): 28 min   Charges:   PT Evaluation $PT Eval Low Complexity: 1 Low PT Treatments $Therapeutic Activity: 8-22 mins   PT G Codes:   PT G-Codes **NOT FOR INPATIENT CLASS** Functional Assessment Tool Used: AM-PAC 6 Clicks Basic  Mobility Functional Limitation: Mobility: Walking and moving around Mobility: Walking and Moving Around Current Status (G6269): At least 20 percent but less than 40 percent impaired, limited or restricted Mobility: Walking and Moving Around Goal Status [id number]): At least 1 percent but less than 20 percent impaired, limited or restricted    Arie Powell H. Owens Shark, PT, DPT, NCS 06/14/17, 2:59 PM [phone]

## 2017-06-14 NOTE — Progress Notes (Signed)
Pt has remained A & O x 4 with no c/o pain. Pt has remained on 2LNC since beginning of shift with NAD. Lung sounds with bilat upper lobe wheezing, diminished in the bases. Pt has been able to transfer from bed to chair and chair to [facility] with NAD. NSR on cardiac monitor with multifocal PVCs. Orders to transfer to floor.

## 2017-06-15 DIAGNOSIS — N289 Disorder of kidney and ureter, unspecified: Secondary | ICD-10-CM

## 2017-06-15 DIAGNOSIS — I5040 Unspecified combined systolic (congestive) and diastolic (congestive) heart failure: Secondary | ICD-10-CM

## 2017-06-15 LAB — GLUCOSE, CAPILLARY
GLUCOSE-CAPILLARY: 129 mg/dL — AB (ref 65–99)
GLUCOSE-CAPILLARY: 107 mg/dL — AB (ref 65–99)
GLUCOSE-CAPILLARY: 251 mg/dL — AB (ref 65–99)
Glucose-Capillary: 230 mg/dL — ABNORMAL HIGH (ref 65–99)
Glucose-Capillary: 178 mg/dL — ABNORMAL HIGH (ref 65–99)

## 2017-06-15 LAB — BASIC METABOLIC PANEL
Anion gap: 9 (ref 5–15)
BUN: 53 mg/dL — ABNORMAL HIGH (ref 6–20)
CHLORIDE: 106 mmol/L (ref 101–111)
CO2: 29 mmol/L (ref 22–32)
CREATININE: 1.46 mg/dL — AB (ref 0.44–1.00)
Calcium: 8 mg/dL — ABNORMAL LOW (ref 8.9–10.3)
GFR calc Af Amer: 40 mL/min — ABNORMAL LOW (ref >60)
GFR calc non Af Amer: 34 mL/min — ABNORMAL LOW (ref >60)
Glucose, Bld: 143 mg/dL — ABNORMAL HIGH (ref 65–99)
POTASSIUM: 3.8 mmol/L (ref 3.5–5.1)
Sodium: 144 mmol/L (ref 135–145)

## 2017-06-15 MED ORDER — ENOXAPARIN SODIUM 40 MG/0.4ML ~~LOC~~ SOLN
40.0000 mg | SUBCUTANEOUS | Status: DC
  Administered 2017-06-16: 06:00:00 40 mg via SUBCUTANEOUS
  Filled 2017-06-15: qty 0.4

## 2017-06-15 MED ORDER — CARVEDILOL 3.125 MG PO TABS
3.1250 mg | ORAL_TABLET | Freq: Two times a day (BID) | ORAL | Status: DC
  Administered 2017-06-15 – 2017-06-16 (×2): 3.125 mg via ORAL
  Filled 2017-06-15 (×2): qty 1

## 2017-06-15 NOTE — Care Management (Signed)
RNCM called [facility] [phone] back and they are currently processing sleep study order. She will call back in AM with appointment date and time. This RNCM will follow for outpatient sleep study.

## 2017-06-15 NOTE — Care Management (Signed)
Spoke with cardiopulmonary- over night oximetry will be performed tonight on room air. Unable to perform it last night as there is only one machine and was in use with another patient.  Patient is not requiring oxygen on room air at rest.  Requested home oxygen assessment by primary nurse. Requested [facility] due to frequent admissions. Discussed possibility with [facility].  Discussed issues with spiriva with pulmonologist and patient's mail order pharmacy.  Incruse Ellipta and Atrovent could be used as alternatives to the Spiriva.  Patient has used the hospital to home benefit in June 2018 for Spiriva.  Patient says she has used the Incruse "and it did not work." Event organiser with Dr Alva Garnet and he has recommended Incruse or Spiriva (not covered by [employer])  in conjunction with Symbicort, Breo or Advair.  Incruse- 0 dollar copay.  Dulera- not covered: Symbicort: not covered; Breo - covered with 0 dollar copay;  Advair covered 0 dollar copay.   IN SUMMARY: [place]-  no copay BREO OR ADVAIR - COVERED - no copay   Requested [facility] due to frequent admissions. Patient has a copd/pneumonia  protocol kit in the home.  Was asked if need lasix protocol kit in the home but the presentations appear to be related to pulmonary issues and not so much heart failure- even though she has a history.  Will reach out to attending.

## 2017-06-15 NOTE — Progress Notes (Signed)
Overnight pulse oximetry not done. Overnight oximeter is being used on another pt.

## 2017-06-15 NOTE — Care Management (Signed)
This RNCM will work on sleep center outpatient sleep study today with ICU/pulmonologist provider.

## 2017-06-15 NOTE — Care Management (Signed)
RNCM spoke with [facility] [phone] fax [phone] regarding order for sleep study per Dr. Gari Crown that has been faxed to them. Tobin Chad will call this RNCM when order has been received. Patient updated. Avoidable days applied for delayed overnight oximetry 9/18-9/19.

## 2017-06-15 NOTE — Care Management (Signed)
Received approval for [facility].  Adding SW to home health referral

## 2017-06-15 NOTE — Progress Notes (Signed)
SATURATION QUALIFICATIONS: (This note is used to comply with regulatory documentation for home oxygen)  Patient Saturations on Room Air at Rest = 95%  Patient Saturations on Room Air while Ambulating = 93%   

## 2017-06-15 NOTE — Progress Notes (Signed)
Pharmacist - Prescriber Communication  Enoxaparin dose modified from 30 mg subcutaneously once daily to 40 mg subcutaneously once daily due to CrCl > 30 mL/min and ABW > 45 kg.   Jacobs Golab A. [place], [place].D., BCPS Clinical Pharmacist 06/15/2017 07:57

## 2017-06-15 NOTE — Progress Notes (Addendum)
Patient Name: Angela Pham Date of Encounter: 06/15/2017  Primary Cardiologist: [facility] Problem List     Active Problems:   Acute respiratory failure with hypoxia (HCC)   Pressure injury of skin     Subjective   SOB improving. Less wheezing. Now on room air. Notes continue lower extremity swelling, though improved. Renal fuction improving with diuresis from 1.56-->1.46. Potassium 3.8. Documented UOP of 700 mL for the admission and 200 mL for the past 24 hours. No weight this morning.   Inpatient Medications    Scheduled Meds: . aspirin EC  81 mg Oral Daily  . atorvastatin  40 mg Oral Daily  . [START ON 06/16/2017] enoxaparin (LOVENOX) injection  40 mg Subcutaneous Q24H  . ferrous sulfate  325 mg Oral BID WC  . furosemide  40 mg Intravenous BID  . hydrALAZINE  25 mg Oral Q8H  . insulin aspart  0-15 Units Subcutaneous TID WC  . insulin aspart  0-5 Units Subcutaneous QHS  . lactulose  30 g Oral Once per day on Tue Thu Sat  . mirtazapine  45 mg Oral QHS  . mometasone-formoterol  2 puff Inhalation BID  . predniSONE  40 mg Oral Q breakfast  . sodium chloride flush  3 mL Intravenous Q12H  . spironolactone  12.5 mg Oral Daily  . tiotropium  18 mcg Inhalation Daily   Continuous Infusions: . sodium chloride Stopped (06/13/17 1759)   PRN Meds: sodium chloride, acetaminophen **OR** [DISCONTINUED] acetaminophen, albuterol, ALPRAZolam, bisacodyl, magnesium citrate, [DISCONTINUED] ondansetron **OR** ondansetron (ZOFRAN) IV, oxyCODONE, senna-docusate, sodium chloride flush   Vital Signs    Vitals:   06/14/17 2352 06/15/17 0110 06/15/17 0500 06/15/17 0827  BP: (!) 174/83 (!) 141/83 (!) 174/74 (!) 144/66  Pulse: (!) 120 (!) 106 (!) 109 (!) 108  Resp: 18 19 20 18   Temp: (!) 97.5 F (36.4 C) 97.8 F (36.6 C) 97.7 F (36.5 C) 97.8 F (36.6 C)  TempSrc: Oral Oral Oral Oral  SpO2: 97% 90% 95% 96%  Weight: 191 lb 11.2 oz (87 kg)     Height: 4\' 11"  (1.499 m)        Intake/Output Summary (Last 24 hours) at 06/15/17 1002 Last data filed at 06/15/17 0837  Gross per 24 hour  Intake              120 ml  Output              900 ml  Net             -780 ml   Filed Weights   06/12/17 0012 06/12/17 0400 06/14/17 2352  Weight: 190 lb 4.1 oz (86.3 kg) 181 lb (82.1 kg) 191 lb 11.2 oz (87 kg)    Physical Exam    GEN: Well nourished, well developed, in no acute distress.  HEENT: Grossly normal.  Neck: Supple, JVD difficult to assess 2/2 body habitus, no carotid bruits, or masses. Cardiac: RRR, no murmurs, rubs, or gallops. No clubbing, cyanosis, 1+ bilateral pitting edema to the mid shins.  Radials/DP/PT 2+ and equal bilaterally.  Respiratory:  Diminished breath sounds bilateral bases. GI: Soft, nontender, mildly distended, BS + x 4. MS: no deformity or atrophy. Skin: warm and dry, no rash. Neuro:  Strength and sensation are intact. Psych: AAOx3.  Normal affect.  Labs    CBC  Recent Labs  06/13/17 0418 06/14/17 0423  WBC 22.7* 20.0*  HGB 10.5* 10.6*  HCT 31.4* 32.5*  MCV 89.9  88.4  PLT 263 161   Basic Metabolic Panel  Recent Labs  06/14/17 0423 06/15/17 0504  NA 143 144  K 3.9 3.8  CL 107 106  CO2 28 29  GLUCOSE 182* 143*  BUN 52* 53*  CREATININE 1.56* 1.46*  CALCIUM 8.0* 8.0*   Liver Function Tests  Recent Labs  06/13/17 0418  AST 51*  ALT 182*  ALKPHOS 64  BILITOT 0.6  PROT 5.9*  ALBUMIN 2.9*   No results for input(s): LIPASE, AMYLASE in the last 72 hours. Cardiac Enzymes  Recent Labs  06/12/17 1156 06/12/17 1743 06/12/17 2153  TROPONINI 1.79* 1.64* 1.33*   BNP Invalid input(s): POCBNP D-Dimer No results for input(s): DDIMER in the last 72 hours. Hemoglobin A1C No results for input(s): HGBA1C in the last 72 hours. Fasting Lipid Panel No results for input(s): CHOL, HDL, LDLCALC, TRIG, CHOLHDL, LDLDIRECT in the last 72 hours. Thyroid Function Tests No results for input(s): TSH, T4TOTAL, T3FREE,  THYROIDAB in the last 72 hours.  Invalid input(s): FREET3  Telemetry    Not on telemetry - Personally Reviewed  ECG    n/a - Personally Reviewed  Radiology    Dg Chest Port 1 View  Result Date: 06/14/2017 IMPRESSION: 1. Cardiomegaly with bilateral pulmonary venous congestion again noted. 2. Low lung volumes with mild bibasilar atelectasis. Mild bibasilar pulmonary edema cannot be excluded. Small bilateral pleural effusions. No change from prior exam. Electronically Signed   By: Marcello Moores  Register   On: 06/14/2017 06:22    Cardiac Studies   R/LHC 02/2017: Coronary angiography:  Coronary dominance: Right  Left mainstem: Large vessel that bifurcates into the LAD and left circumflex, no significant disease noted  Left anterior descending (LAD): Large vessel that extends to the apical region, diagonal branch 2 of moderate size, no significant disease noted  Left circumflex (LCx): Large vessel with OM branch 2, no significant disease noted  Right coronary artery (RCA): Right dominant vessel with PL and PDA, no significant disease noted  Left ventriculography: Not performed as she had recent echo and has underlying CRI  Final Conclusions:  Normal coronary arteries  Right heart pressures: RA: mean 22 RV: 53/18/22 RVEDP: 22 LVEDP 38-43 Unable to advance catheter to wedge pressure despite multiple attempts   Recommendations:  Normal coronary arteries Wall motion secondary to LBBB Markedly elevated LVEDP Elevated right atrial pressures Numbers consistent with chronic diastolic CHF Blood pressure markedly elevated , tachycardic Will D/c amlodipine, start verapamil to assist with rate control  TTE 02/2017: Study Conclusions  - Left ventricle: The cavity size was at the upper limits of normal. Wall thickness was increased in a pattern of mild LVH. Systolic function was mildly reduced. The estimated ejection fraction was in the range of 45% to 50%. There is  hypokinesis of the basal-midanteroseptal myocardium. - Aortic valve: Poorly visualized. A bicuspid morphology cannot be excluded. There was mild regurgitation. - Mitral valve: Calcified annulus. Mildly thickened leaflets . Leaflet separation was mildly reduced. There was moderate regurgitation directed posteriorly. - Left atrium: The atrium was mildly dilated. - Right ventricle: The cavity size was normal. Wall thickness was normal. Systolic function was normal. - Pulmonary arteries: Systolic pressure was moderately increased, in the range of 55 mm Hg to 60 mm Hg. - Pericardium, extracardiac: A trivial pericardial effusion was identified posterior to the heart.  TTE with bubble study 05/2017: Study Conclusions  - Left ventricle: Systolic function was moderately reduced. The estimated ejection fraction was in the range of 35%  to 40%. - Aortic valve: There was mild regurgitation. - Mitral valve: There was moderate regurgitation. - Left atrium: The atrium was mildly dilated. - Atrial septum: Echo contrast study showed no right-to-left atrial level shunt, at baseline or with provocation.  Patient Profile     76 y.o. female with history of chronic combined CHF, pulmonary hypertension, pulmonary edema, reported asthma since childhood with frequent nebulizer usage, HTN, DM2 noncompliance, and HLD who was admitted to [facility] on 9/16 with increased SOB and wheezing.  Assessment & Plan    1. Acute on chronic respiratory failure with hypoxia: -Improved -Supportive care  2. Acute on chronic combined CHF/pulmonary hypertension: -Improving -She does appear to still be volume up -Perhaps some of her wheezing is cardiac in etiology given minimal improvement in breathing with nebs -Continue IV Lasix 40 mg bid with KCl repletion as needed and monitoring of renal function -Verapamil stopped given her cardiomyopathy  -Not on an ACEi/ARB given angioedema -Continue  spironolactone  -As her breathing improves, consider adding beta blocker -Consider adding Imdur to hydralazine -Recommend tapering of steroids when able as this is only leading to further fluid retention   -Strict Is and Os -Daily weight -CHF education -Compliance advised   3. Elevated troponin: -This is not a NSTEMI -This is demand ischemia from her severe pulmonary hypertension, underlying CKD, and anemia of chronic disease -Patient had a LHC 3 months prior that showed normal coronary arteries -No plans for ischemic evaluation at this time  4. Acute on CKD stage III: -Improving with diuresis   Signed, Christell Faith, PA-C [place] Pager: [phone] 06/15/2017, 10:02 AM   Attending Note Patient seen and examined, agree with detailed note above,  Patient presentation and plan discussed on rounds.   EKG lab work, chest x-ray, echocardiogram reviewed independently by myself  Improving shortness of breath with aggressive diuresis Long discussion concerning habits at home, diet, fluid intake She does not have a scale, does not monitor weight Takes Lasix 40 mg daily, does not seem to work for her  On physical exam obese, supine, no distress, unable to estimate JVD, lungs clear to auscultation bilaterally, heart sounds regular with no murmurs appreciated, abdomen soft nontender, no significant lower extremity edema  Lab work reviewed showing declining creatinine 1.46, BUN 53, potassium 3.8, hematocrit 32  A/P 1) acute on chronic respiratory distress Acute on chronic diastolic CHF Portable right, no monitoring of weight at home -Consider contacting case manager onto a to see if there is a scale available for her to take home --Would change Lasix 40 to torsemide 40 mg daily Extra torsemide 40 mg at 2 PM for  3-4 pound weight gain  She would benefit from advanced home CHF protocol with Lasix in the home cardiology can sign the order and follow  Long discussion about  weight, diuretics, adjusting her medications, salt and fluid intake Would encourage follow-up in CHF clinic Greater than 50% was spent in counseling and coordination of care with patient Total encounter time 35 minutes or more   Signed: Esmond Plants  M.D., Ph.D. [facility]

## 2017-06-15 NOTE — Progress Notes (Signed)
[place] [facility] [place] NAME: Angela Pham    MR#:  [id number]  DATE OF BIRTH:  September 28, 1940  SUBJECTIVE: Admitted for respiratory distress, found to have CHF exacerbation. The patient was here multiple times this year in June, August 8, September 1, discharged on September 6. Shortness of breath is better. off the BiPAP transferred out of ICU. Unable to get overnight pulse oximetry because of equipment be used on another patient Patient is requesting to have it done tonight.  CHIEF COMPLAINT:   Chief Complaint  Patient presents with  . Respiratory Distress    REVIEW OF SYSTEMS:    Review of Systems  Constitutional: Negative for chills and fever.  HENT: Negative for hearing loss.   Eyes: Negative for blurred vision, double vision and photophobia.  Respiratory: Negative for cough, hemoptysis, shortness of breath and wheezing.   Cardiovascular: Negative for palpitations, orthopnea and leg swelling.  Gastrointestinal: Negative for abdominal pain, diarrhea and vomiting.  Genitourinary: Negative for dysuria and urgency.  Musculoskeletal: Negative for myalgias and neck pain.  Skin: Negative for rash.  Neurological: Negative for dizziness, focal weakness, seizures, weakness and headaches.  Psychiatric/Behavioral: Negative for memory loss. The patient does not have insomnia.     Nutrition:  Tolerating Diet: Tolerating PT:      DRUG ALLERGIES:   Allergies  Allergen Reactions  . Ace Inhibitors Anaphylaxis    Tongue swelling  . Shellfish Allergy Hives and Swelling    Swelling around face and mouth     VITALS:  Blood pressure (!) 144/66, pulse (!) 108, temperature 97.8 F (36.6 C), temperature source Oral, resp. rate 18, height 4\' 11"  (1.499 m), weight 87 kg (191 lb 11.2 oz), SpO2 96 %.  PHYSICAL EXAMINATION:   Physical Exam  GENERAL:  76 y.o.-year-old patient lying in the bed with Some respiratory distress. EYES: Pupils equal,  round, reactive to light  . No scleral icterus. Extraocular muscles intact.  HEENT: Head atraumatic, normocephalic. Oropharynx and nasopharynx clear. She has a BiPAP mask on but able to communicate. NECK:  Supple, no jugular venous distention. No thyroid enlargement, no tenderness.  LUNGS clear to auscultation, no wheezing, no rales. CARDIOVASCULAR: S1, S2 normal. No murmurs, rubs, or gallops.  ABDOMEN: Soft, nontender, nondistended. Bowel sounds present. No organomegaly or mass.  EXTREMITIES: No pedal edema, cyanosis, or clubbing.  NEUROLOGIC: Unable to assess neurologic exam because she is on BiPAP.  PSYCHIATRIC: The patient is alert and oriented x 3.  SKIN: No obvious rash, lesion, or ulcer.    LABORATORY PANEL:   CBC  Recent Labs Lab 06/14/17 0423  WBC 20.0*  HGB 10.6*  HCT 32.5*  PLT 260   ------------------------------------------------------------------------------------------------------------------  Chemistries   Recent Labs Lab 06/12/17 0633 06/13/17 0418  06/15/17 0504  NA 140 141  < > 144  K 4.4 4.1  < > 3.8  CL 107 108  < > 106  CO2 23 25  < > 29  GLUCOSE 190* 165*  < > 143*  BUN 65* 57*  < > 53*  CREATININE 1.87* 1.78*  < > 1.46*  CALCIUM 8.3* 8.1*  < > 8.0*  MG 2.2  --   --   --   AST 199* 51*  --   --   ALT 257* 182*  --   --   ALKPHOS 74 64  --   --   BILITOT 0.6 0.6  --   --   < > =  values in this interval not displayed. ------------------------------------------------------------------------------------------------------------------  Cardiac Enzymes  Recent Labs Lab 06/12/17 2153  TROPONINI 1.33*   ------------------------------------------------------------------------------------------------------------------  RADIOLOGY:  Dg Chest Port 1 View  Result Date: 06/14/2017 CLINICAL DATA:  Respiratory failure. EXAM: PORTABLE CHEST 1 VIEW COMPARISON:  06/13/2017. FINDINGS: Two cardiomegaly with mild pulmonary venous congestion. Low lung  volumes. Mild bibasilar pulmonary edema cannot be excluded . Small bilateral pleural effusions. Similar findings noted on prior exam. IMPRESSION: 1. Cardiomegaly with bilateral pulmonary venous congestion again noted. 2. Low lung volumes with mild bibasilar atelectasis. Mild bibasilar pulmonary edema cannot be excluded. Small bilateral pleural effusions. No change from prior exam. Electronically Signed   By: Marcello Moores  Register   On: 06/14/2017 06:22     ASSESSMENT AND PLAN:   Active Problems:   Acute respiratory failure with hypoxia (HCC)   Pressure injury of skin  Recurrent respiratory failure: Admitted 3 times this year already. Discharge 2 weeks ago that time she had pneumonia and COPD flares. Patient comes back again with shortness of breath. #1 acute respiratory failure and chronic respiratory failure with hypoxia, O2 saturation when EMS went to her home were 60%.; Patient is off BiPAP .room airsaturation 96%. Patient had a CT and her chest on September 1 which showed no pulmonary embolus. But has centrilobular emphysema. Patient also has dilated pulmonary artery which reflects chronic pulmonary hypertension. Patient is seen by pulmonary, cardiology, scheduled to have overnight pulse oximetry yesterday but it was not done. Please see if she can have overnight pulse oximetry daily.   #2. Non-ST elevation MI: Troponin up to 1.32. Troponin elevation likely due to demand ischemia from respiratory distress. Echocardiogram, cardiology consult will be ordered. She had a cardiac catheter in June of this year and had normal coronaries that time. Patient had elevated right atrial pressures. Pulmonary artery pressure is increased up to 55-60 mm. Her symptoms of shortness of breath may be coming from pulmonary hypertension also... On IV Lasix as per cardiology,.  #3. Acute on chronic renal failure: Patient creatinine 2.28 yesterday to date is 1.87 . #4 .sepsis. Elevated lactic acid, acute renal failure, WBC  on admission. Follow culture data, continue IV antibiotics. 5.essential hypertension: Controlled.stopped verapamil and started on hydralazine.    likely Discharge tomorrow home.   All the records are reviewed and case discussed with Care Management/Social Workerr. Management plans discussed with the patient, family and they are in agreement.  CODE STATUS: Full code  TOTAL TIME TAKING CARE OF THIS PATIENT: 35 minutes.   POSSIBLE D/C IN 1-2 DAYS, DEPENDING ON CLINICAL CONDITION.   Epifanio Lesches M.D on 06/15/2017 at 8:44 AM  Between 7am to 6pm - Pager - [phone]  After 6pm go to [email or web address] - password [email or web address]  Office  [phone]  CC: Primary care physician; Cletis Athens, MD

## 2017-06-15 NOTE — Progress Notes (Signed)
Overnight oximetry initiated on rm air. 

## 2017-06-16 ENCOUNTER — Other Ambulatory Visit: Payer: Self-pay | Admitting: *Deleted

## 2017-06-16 ENCOUNTER — Telehealth: Payer: Self-pay | Admitting: Internal Medicine

## 2017-06-16 DIAGNOSIS — J449 Chronic obstructive pulmonary disease, unspecified: Secondary | ICD-10-CM

## 2017-06-16 DIAGNOSIS — I5023 Acute on chronic systolic (congestive) heart failure: Secondary | ICD-10-CM

## 2017-06-16 LAB — BASIC METABOLIC PANEL
Anion gap: 10 (ref 5–15)
BUN: 46 mg/dL — AB (ref 6–20)
CALCIUM: 8.2 mg/dL — AB (ref 8.9–10.3)
CO2: 29 mmol/L (ref 22–32)
CREATININE: 1.38 mg/dL — AB (ref 0.44–1.00)
Chloride: 104 mmol/L (ref 101–111)
GFR calc Af Amer: 43 mL/min — ABNORMAL LOW (ref >60)
GFR, EST NON AFRICAN AMERICAN: 37 mL/min — AB (ref >60)
GLUCOSE: 162 mg/dL — AB (ref 65–99)
Potassium: 3.6 mmol/L (ref 3.5–5.1)
SODIUM: 143 mmol/L (ref 135–145)

## 2017-06-16 LAB — GLUCOSE, CAPILLARY
GLUCOSE-CAPILLARY: 129 mg/dL — AB (ref 65–99)
Glucose-Capillary: 128 mg/dL — ABNORMAL HIGH (ref 65–99)

## 2017-06-16 MED ORDER — FLUTICASONE-SALMETEROL 250-50 MCG/DOSE IN AEPB: 1.0000 | INHALATION_SPRAY | Freq: Two times a day (BID) | RESPIRATORY_TRACT | 0 refills | Status: DC

## 2017-06-16 MED ORDER — TORSEMIDE 20 MG PO TABS: 40.0000 mg | ORAL_TABLET | Freq: Every day | ORAL | 0 refills | Status: DC

## 2017-06-16 MED ORDER — UMECLIDINIUM BROMIDE 62.5 MCG/INH IN AEPB: 1.0000 | INHALATION_SPRAY | Freq: Every day | RESPIRATORY_TRACT | 0 refills | Status: DC

## 2017-06-16 MED ORDER — PREDNISONE 10 MG PO TABS: 10.0000 mg | ORAL_TABLET | ORAL | 0 refills | Status: DC

## 2017-06-16 MED ORDER — UMECLIDINIUM BROMIDE 62.5 MCG/INH IN AEPB
1.0000 | INHALATION_SPRAY | Freq: Every day | RESPIRATORY_TRACT | Status: DC
  Administered 2017-06-16: 1 via RESPIRATORY_TRACT
  Filled 2017-06-16: qty 7

## 2017-06-16 MED ORDER — MOMETASONE FURO-FORMOTEROL FUM 200-5 MCG/ACT IN AERO: 2.0000 | INHALATION_SPRAY | Freq: Two times a day (BID) | RESPIRATORY_TRACT | 0 refills | Status: DC

## 2017-06-16 MED ORDER — CARVEDILOL 3.125 MG PO TABS: 3.1250 mg | ORAL_TABLET | Freq: Two times a day (BID) | ORAL | 0 refills | Status: DC

## 2017-06-16 MED ORDER — TIOTROPIUM BROMIDE MONOHYDRATE 18 MCG IN CAPS: 18.0000 ug | ORAL_CAPSULE | Freq: Every day | RESPIRATORY_TRACT | 12 refills | Status: DC

## 2017-06-16 MED ORDER — SPIRONOLACTONE 25 MG PO TABS: 12.5000 mg | ORAL_TABLET | Freq: Every day | ORAL | 0 refills | Status: DC

## 2017-06-16 MED ORDER — TORSEMIDE 20 MG PO TABS: 40.0000 mg | ORAL_TABLET | Freq: Every day | ORAL | Status: DC

## 2017-06-16 NOTE — Care Management (Signed)
Patient is for discharge home today.  Clarified the discharge inhalers that were discussed yesterday : Incruse Elipta with [employer] OR Advair. Have orders for nocturnal oxygen and [facility] will provide.  Requested one more home oxygen prior to discharge.  UPdated primary nurse

## 2017-06-16 NOTE — Progress Notes (Signed)
SATURATION QUALIFICATIONS: (This note is used to comply with regulatory documentation for home oxygen)  Patient Saturations on Room Air at Rest = 96%  Patient Saturations on Room Air while Ambulating = 94%  Madlyn Frankel, RN

## 2017-06-16 NOTE — Progress Notes (Signed)
Patient needs MD note for niece at discharge. Madlyn Frankel, RN

## 2017-06-16 NOTE — Progress Notes (Signed)
Discharge instructions given and went over with patient at bedside. Prescriptions reviewed. All questions answered. Patient discharged home via wheelchair by nursing staff. Madlyn Frankel, RN

## 2017-06-16 NOTE — Care Management (Signed)
Hypoxia did not resolve with use of IV and inhaled steroids, or diuretics.  Will need nocturnal oxygen

## 2017-06-16 NOTE — Telephone Encounter (Signed)
TCM... Sherika called from hospital stating patient will be discharged later today but would like a call back   Pt saw Dr End in hospital and needs a 1 week follow up with heart care Pleas advise when we can see patient.

## 2017-06-16 NOTE — Progress Notes (Signed)
Physical Therapy Treatment Patient Details Name: Angela Pham MRN: [id number] DOB: 05/14/1941 Today's Date: 06/16/2017    History of Present Illness presented to ER secondary to progressive SOB; admitted with acute respiratory failure secondary to COPD/asthma exacerbation, HCAP.  Has required BiPAP since admission, currently weaned to 2L supplemental O2 via [place].    PT Comments    Pt ambulating to/from bathroom without assist in room.  Ambulated in hallway without AD and no difficulties.  Pt without concerns regarding discharge.  Reports some continued weakness.  Discussed home safety and energy conservation techniques.   Follow Up Recommendations  No PT follow up     Equipment Recommendations       Recommendations for Other Services       Precautions / Restrictions Precautions Precautions: Fall Restrictions Weight Bearing Restrictions: No    Mobility  Bed Mobility               General bed mobility comments: seated in recliner beginning/end of treatment session  Transfers Overall transfer level: Modified independent Equipment used: None Transfers: Sit to/from Stand Sit to Stand: Modified independent (Device/Increase time)            Ambulation/Gait Ambulation/Gait assistance: Modified independent (Device/Increase time) Ambulation Distance (Feet): 150 Feet Assistive device: None     Gait velocity interpretation: Below normal speed for age/gender     Stairs            Wheelchair Mobility    Modified Rankin (Stroke Patients Only)       Balance Overall balance assessment: Modified Independent   Sitting balance-Leahy Scale: Good     Standing balance support: No upper extremity supported Standing balance-Leahy Scale: Good                              Cognition Arousal/Alertness: Awake/alert Behavior During Therapy: WFL for tasks assessed/performed Overall Cognitive Status: Within Functional Limits for tasks assessed                                         Exercises      General Comments        Pertinent Vitals/Pain Pain Assessment: No/denies pain    Home Living                      Prior Function            PT Goals (current goals can now be found in the care plan section) Progress towards PT goals: Progressing toward goals    Frequency    Min 2X/week      PT Plan Current plan remains appropriate    Co-evaluation              AM-PAC PT "6 Clicks" Daily Activity  Outcome Measure  Difficulty turning over in bed (including adjusting bedclothes, sheets and blankets)?: None Difficulty moving from lying on back to sitting on the side of the bed? : None Difficulty sitting down on and standing up from a chair with arms (e.g., wheelchair, bedside commode, etc,.)?: None Help needed moving to and from a bed to chair (including a wheelchair)?: None Help needed walking in hospital room?: None Help needed climbing 3-5 steps with a railing? : A Little 6 Click Score: 23    End of Session Equipment Utilized During Treatment: Gait belt Activity  Tolerance: Patient tolerated treatment well Patient left: in chair;with call bell/phone within reach         Time: 1100-1109 PT Time Calculation (min) (ACUTE ONLY): 9 min  Charges:  $Gait Training: 8-22 mins                    G Codes:       Chesley Noon, PTA 06/16/17, 11:15 AM

## 2017-06-16 NOTE — Progress Notes (Signed)
Patient Name: Angela Pham Date of Encounter: 06/16/2017  Primary Cardiologist: [facility] Problem List     Active Problems:   Acute respiratory failure with hypoxia (HCC)   Pressure injury of skin     Subjective   SOB and lower extremity swelling continue to improve. No bmet this morning. Net - 1.6 L for the admission and 1.2 negative for the past 24 hours. Still no weight. Qualified for nocturnal O2.   Inpatient Medications    Scheduled Meds: . aspirin EC  81 mg Oral Daily  . atorvastatin  40 mg Oral Daily  . carvedilol  3.125 mg Oral BID WC  . enoxaparin (LOVENOX) injection  40 mg Subcutaneous Q24H  . ferrous sulfate  325 mg Oral BID WC  . furosemide  40 mg Intravenous BID  . hydrALAZINE  25 mg Oral Q8H  . insulin aspart  0-15 Units Subcutaneous TID WC  . insulin aspart  0-5 Units Subcutaneous QHS  . lactulose  30 g Oral Once per day on Tue Thu Sat  . mirtazapine  45 mg Oral QHS  . mometasone-formoterol  2 puff Inhalation BID  . predniSONE  40 mg Oral Q breakfast  . sodium chloride flush  3 mL Intravenous Q12H  . spironolactone  12.5 mg Oral Daily  . tiotropium  18 mcg Inhalation Daily   Continuous Infusions: . sodium chloride Stopped (06/13/17 1759)   PRN Meds: sodium chloride, acetaminophen **OR** [DISCONTINUED] acetaminophen, albuterol, ALPRAZolam, bisacodyl, magnesium citrate, [DISCONTINUED] ondansetron **OR** ondansetron (ZOFRAN) IV, oxyCODONE, senna-docusate, sodium chloride flush   Vital Signs    Vitals:   06/15/17 1324 06/15/17 2149 06/16/17 0525 06/16/17 0815  BP: 140/70 (!) 144/70 (!) 152/83 (!) 143/84  Pulse: (!) 111 95 97 97  Resp: 18 20 (!) 22 20  Temp:  (!) 97.4 F (36.3 C) 97.8 F (36.6 C) 97.7 F (36.5 C)  TempSrc:  Oral Oral Oral  SpO2: 96% 96% 95% 95%  Weight:      Height:        Intake/Output Summary (Last 24 hours) at 06/16/17 0952 Last data filed at 06/15/17 1849  Gross per 24 hour  Intake              480 ml  Output              1400 ml  Net             -920 ml   Filed Weights   06/12/17 0012 06/12/17 0400 06/14/17 2352  Weight: 190 lb 4.1 oz (86.3 kg) 181 lb (82.1 kg) 191 lb 11.2 oz (87 kg)    Physical Exam    GEN: Well nourished, well developed, in no acute distress.  HEENT: Grossly normal.  Neck: Supple, JVD difficult to assess 2/2 body habitus, no carotid bruits, or masses. Cardiac: RRR, no murmurs, rubs, or gallops. No clubbing, cyanosis, trace bilateral pre-tibial edema.  Radials/DP/PT 2+ and equal bilaterally.  Respiratory:  Improving breath sounds bilaterally. GI: Soft, nontender, nondistended, BS + x 4. MS: no deformity or atrophy. Skin: warm and dry, no rash. Neuro:  Strength and sensation are intact. Psych: AAOx3.  Normal affect.  Labs    CBC  Recent Labs  06/14/17 0423  WBC 20.0*  HGB 10.6*  HCT 32.5*  MCV 88.4  PLT 532   Basic Metabolic Panel  Recent Labs  06/14/17 0423 06/15/17 0504  NA 143 144  K 3.9 3.8  CL 107 106  CO2  28 29  GLUCOSE 182* 143*  BUN 52* 53*  CREATININE 1.56* 1.46*  CALCIUM 8.0* 8.0*   Liver Function Tests No results for input(s): AST, ALT, ALKPHOS, BILITOT, PROT, ALBUMIN in the last 72 hours. No results for input(s): LIPASE, AMYLASE in the last 72 hours. Cardiac Enzymes No results for input(s): CKTOTAL, CKMB, CKMBINDEX, TROPONINI in the last 72 hours. BNP Invalid input(s): POCBNP D-Dimer No results for input(s): DDIMER in the last 72 hours. Hemoglobin A1C No results for input(s): HGBA1C in the last 72 hours. Fasting Lipid Panel No results for input(s): CHOL, HDL, LDLCALC, TRIG, CHOLHDL, LDLDIRECT in the last 72 hours. Thyroid Function Tests No results for input(s): TSH, T4TOTAL, T3FREE, THYROIDAB in the last 72 hours.  Invalid input(s): FREET3  Telemetry    Not on telemetry - Personally Reviewed  ECG    n/a - Personally Reviewed  Radiology    No results found.  Cardiac Studies   [facility] 02/2017: Coronary angiography:    Coronary dominance: Right  Left mainstem: Large vessel that bifurcates into the LAD and left circumflex, no significant disease noted  Left anterior descending (LAD): Large vessel that extends to the apical region, diagonal branch 2 of moderate size, no significant disease noted  Left circumflex (LCx): Large vessel with OM branch 2, no significant disease noted  Right coronary artery (RCA): Right dominant vessel with PL and PDA, no significant disease noted  Left ventriculography: Not performed as she had recent echo and has underlying CRI  Final Conclusions:  Normal coronary arteries  Right heart pressures: RA: mean 22 RV: 53/18/22 RVEDP: 22 LVEDP 38-43 Unable to advance catheter to wedge pressure despite multiple attempts   Recommendations:  Normal coronary arteries Wall motion secondary to LBBB Markedly elevated LVEDP Elevated right atrial pressures Numbers consistent with chronic diastolic CHF Blood pressure markedly elevated , tachycardic Will D/c amlodipine, start verapamil to assist with rate control  TTE 02/2017: Study Conclusions  - Left ventricle: The cavity size was at the upper limits of normal. Wall thickness was increased in a pattern of mild LVH. Systolic function was mildly reduced. The estimated ejection fraction was in the range of 45% to 50%. There is hypokinesis of the basal-midanteroseptal myocardium. - Aortic valve: Poorly visualized. A bicuspid morphology cannot be excluded. There was mild regurgitation. - Mitral valve: Calcified annulus. Mildly thickened leaflets . Leaflet separation was mildly reduced. There was moderate regurgitation directed posteriorly. - Left atrium: The atrium was mildly dilated. - Right ventricle: The cavity size was normal. Wall thickness was normal. Systolic function was normal. - Pulmonary arteries: Systolic pressure was moderately increased, in the range of 55 mm Hg to 60 mm Hg. - Pericardium,  extracardiac: A trivial pericardial effusion was identified posterior to the heart.  TTE with bubble study 05/2017: Study Conclusions  - Left ventricle: Systolic function was moderately reduced. The estimated ejection fraction was in the range of 35% to 40%. - Aortic valve: There was mild regurgitation. - Mitral valve: There was moderate regurgitation. - Left atrium: The atrium was mildly dilated. - Atrial septum: Echo contrast study showed no right-to-left atrial level shunt, at baseline or with provocation.  Patient Profile     76 y.o. female with history of chronic combined CHF, pulmonary hypertension, pulmonary edema, reported asthma since childhood with frequent nebulizer usage, HTN, DM2 noncompliance, and HLD who was admitted to [facility] on 9/16 with increased SOB and wheezing.  Assessment & Plan    1. Acute on chronic respiratory failure with hypoxia: -  Improved -Supportive care  2. Acute on chronic combined CHF/pulmonary hypertension: -Much improved -Perhaps some of her wheezing is cardiac in etiology given minimal improvement in breathing with nebs -Transition to PO torsemide 40 mg daily -Await bmet this morning -Verapamil stopped given her cardiomyopathy  -Not on an ACEi/ARB given angioedema -Continue spironolactone  -As her breathing improves, consider adding beta blocker -Consider adding Imdur to hydralazine -Recommend tapering of steroids when able as this is only leading to further fluid retention  -Strict Is and Os -Daily weight -CHF education -Compliance advised   3. Elevated troponin: -This is not a NSTEMI -This is demand ischemia from her severe pulmonary hypertension, underlying CKD, and anemia of chronic disease -Patient had a LHC 3 months prior that showed normal coronary arteries -No plans for ischemic evaluation at this time  4. Acute on CKD stage III: -Improving with diuresis  -No bmet this morning   Signed, Christell Faith, PA-C [place] Pager: [phone] 06/16/2017, 9:52 AM

## 2017-06-16 NOTE — Telephone Encounter (Signed)
Patient scheduled with Bernerd Pho, PA on 06/23/17 at 1400. Sherika at [facility] notified.

## 2017-06-16 NOTE — Care Management (Signed)
Patient has qualified for nocturnal oxygen.  Notified [facility].

## 2017-06-16 NOTE — Progress Notes (Signed)
Medications administered by student RN [phone] supervision of Clinical Instructor Lynann Bologna MSN, RN-BC or patient's assigned RN.

## 2017-06-16 NOTE — Care Management (Signed)
SleepMed study planned for next Thursday 06/23/17 from 8:45PM-6:00AM. Patient aware and agrees. Follow up with Dr. Merton Border on 07/06/17 at [phone]- patient aware and agrees.

## 2017-06-16 NOTE — Discharge Summary (Addendum)
Angela Pham, is a 76 y.o. female  DOB 01-Nov-1940  MRN [id number].  Admission date:  06/11/2017  Admitting Physician  Harvie Bridge, DO  Discharge Date:  06/16/2017   Primary MD  Cletis Athens, MD  Recommendations for primary care physician for things to follow:  Follow-up with PCP in one week Follow up with Dr.  Esmond Plants in 2 weeks Follow-up with Dr. Rosita Fire in 2 weeks.   Admission Diagnosis  Acute renal insufficiency [N28.9] Nosocomial pneumonia [J18.9] Elevated troponin [R74.8] COPD exacerbation (HCC) [J44.1]   Discharge Diagnosis  Acute renal insufficiency [N28.9] Nosocomial pneumonia [J18.9] Elevated troponin [R74.8] COPD exacerbation (HCC) [J44.1]    Active Problems:   Acute respiratory failure with hypoxia (HCC)   Pressure injury of skin      Past Medical History:  Diagnosis Date  . Asthma   . Chronic combined systolic and diastolic CHF (congestive heart failure) ([place])    a. TTE 02/2017: EF 45-50%, basal2midanteroseptal HK  . Coronary artery disease, non-occlusive    a. LHC 02/2017 normal coronary arteries  . Hyperlipidemia   . Hypertension   . Morbid obesity ([place])   . Pulmonary hypertension ([place])   . Type II diabetes mellitus ([place])     Past Surgical History:  Procedure Laterality Date  . c-section    . RIGHT/LEFT HEART CATH AND CORONARY ANGIOGRAPHY N/A 03/09/2017   Procedure: Right/Left Heart Cath and Coronary Angiography;  Surgeon: Minna Merritts, MD;  Location: [facility];  Service: Cardiovascular;  Laterality: N/A;       History of present illness and  Hospital Course:     Kindly see H&P for history of present illness and admission details, please review complete Labs, Consult reports and Test reports for all details in brief  HPI  from the history and physical done on the  day of admission 76 year old female patient admitted for shortness of breath., Patient was here 3 times since June of this year, recently discharged from hospital after COPD exacerbation. Comes back again with shortness of breath, wheezing at home, admitted to intensive care unit.    Hospital Course  #1 recurrent respiratory failure likely secondary to combination of acute on chronic diastolic heart failure, initially was on BiPAP, and admitted to intensive care unit. Seen by Dr. Rosita Fire and also cardiology. Patient will have the BiPAP, continued on nasal cannula 2 L. Patient received IV diuretics with Lasix. Patient initial oxygen saturation by EMS 60% on room air. Cardiology felt that the strong diuretics, stop with IV Lasix, started on torsemide. We discontinued the verapamil that she was taking at home for cardiomyopathy because of shortness of breath. Continue Aldactone. Not on ACE inhibitor as because of angioedema. Patient needs to be started on: Regular outpatient. Added Coreg, Aldactone at discharge. Continue hydralazine, can follow up with Dr. Esmond Plants as an outpatient to see if she needs to be on Imdur   #2 elevated troponins, non-ST elevation MI is ruled out by cardiology, has demand ischemia from severe pulmonary hypertension, anemia of chronic kidney disease. Patient had left heart catheterization 3 months ago and treated normal coronaries. Acute on chronic kidney disease history: Improved with diuretics. Creatinine improved from 2.8-1.87. #4 sepsis and elevated lactic acid, acute renal failure, diabetes and admission: Received antibiotics temporarily. #5. Discharge COPD: Patient on breo and incruse.insurance does not pay for spiriva it seems.  6.Supplemental O2 is arranged,based  On overnight  oximetry results. Overnight 02  saturations were less than 88%  for 22 minutes.   Discharge Condition: Stable   Follow UP  Follow-up Information    [facility] Follow up on 06/21/2017.   Specialty:  Cardiology Why:  at 2:00pm Contact information: [place] [address] [place]. Go on 06/23/2017.   Why:  be there at 8:45PM for overnight study and plan pick up at 6:00AM Contact information: Telephone [phone] Address: [address] [place] [address]        Wilhelmina Mcardle, MD. Go on 07/06/2017.   Specialty:  Pulmonary Disease Why:  at 0815AM Contact information: [place] [address] [phone]        Minna Merritts, MD Follow up in 1 week(s).   Specialty:  Cardiology Contact information: [place] [address] [phone]             Discharge Instructions  and  Discharge Medications     Discharge Instructions    AMB referral to pulmonary rehabilitation    Complete by:  As directed    Please select a program:  Respiratory Care Services   Respiratory Care Services Diagnosis:  CHF Comment -  Combined CHF with EF 35 - 40%   Program Prescription:  O2 Administration by RT, EP, or RN if SpO2<88%   Face-to-face encounter (required for Medicare/Medicaid patients)    Complete by:  As directed    I Epifanio Lesches certify that this patient is under my care and that I, or a nurse practitioner or physician's assistant working with me, had a face-to-face encounter that meets the physician face-to-face encounter requirements with this patient on 06/16/2017. The encounter with the patient was in whole, or in part for the following medical condition(s) which is the primary reason for home health care  1. Recurrent Respiratory failure  acute on chronic diastolic heart failure   depression   history of COPD Pulmonary hypertension   The encounter with the patient was in whole, or in part, for the following medical condition, which is the primary reason for home health care:   whole   I certify that, based on my findings, the following services are medically necessary home health services:  Nursing   Reason for Medically Necessary Home Health Services:  Skilled Nursing- Teaching of Disease Process/Symptom Management   My clinical findings support the need for the above services:  Unsafe ambulation due to balance issues   Further, I certify that my clinical findings support that this patient is homebound due to:  Unable to leave home safely without assistance   Home Health    Complete by:  As directed    To provide the following care/treatments:   [place] RN Social work       Allergies as of 06/16/2017      Reactions   Ace Inhibitors Anaphylaxis   Tongue swelling   Shellfish Allergy Hives, Swelling   Swelling around face and mouth      Medication List    STOP taking these medications   furosemide 40 MG tablet Commonly known as:  LASIX   verapamil 180 MG 24 hr capsule Commonly known as:  VERELAN PM     TAKE these medications   ALPRAZolam 0.25 MG tablet Commonly known as:  XANAX Take 1 tablet (0.25 mg total) by mouth 3 (three) times daily.   aspirin 81  MG EC tablet Take 1 tablet (81 mg total) by mouth daily.   atorvastatin 40 MG tablet Commonly known as:  LIPITOR Take 40 mg by mouth daily.   COMBIVENT IN Inhale 3 puffs into the lungs every 4 (four) hours as needed.   DUONEB 0.5-2.5 (3) MG/3ML Soln Generic drug:  ipratropium-albuterol Inhale 3 mLs into the lungs every 4 (four) hours as needed.   EYE VITAMINS PO Take 1 tablet by mouth daily.   ferrous sulfate 325 (65 FE) MG tablet Take 1 tablet (325 mg total) by mouth 2 (two) times daily with a meal.   hydrALAZINE 25 MG tablet Commonly known as:  APRESOLINE Take 1 tablet (25 mg total) by mouth every 8 (eight) hours. What changed:  how much to take  when to take this   INCRUSE ELLIPTA 62.5 MCG/INH Aepb Generic drug:  umeclidinium bromide Inhale 1 puff into the lungs  daily.   lactulose 10 GM/15ML solution Commonly known as:  CHRONULAC Take 30 g by mouth daily.   levocetirizine 5 MG tablet Commonly known as:  XYZAL Take 10 mg by mouth daily.   metFORMIN 850 MG tablet Commonly known as:  GLUCOPHAGE Take 1 tablet by mouth 2 (two) times daily.   mirtazapine 45 MG tablet Commonly known as:  REMERON Take 1 tablet by mouth at bedtime.   mometasone-formoterol 200-5 MCG/ACT Aero Commonly known as:  DULERA Inhale 2 puffs into the lungs 2 (two) times daily.   montelukast 10 MG tablet Commonly known as:  SINGULAIR Take 1 tablet (10 mg total) by mouth at bedtime.   predniSONE 10 MG tablet Commonly known as:  DELTASONE Take 1 tablet (10 mg total) by mouth as directed.   tiotropium 18 MCG inhalation capsule Commonly known as:  SPIRIVA Place 1 capsule (18 mcg total) into inhaler and inhale daily.   torsemide 20 MG tablet Commonly known as:  DEMADEX Take 2 tablets (40 mg total) by mouth daily.            Durable Medical Equipment        Start     Ordered   06/16/17 1138  For home use only DME oxygen  Once    Question Answer Comment  Mode or (Route) Nasal cannula   Liters per Minute 2   Frequency Continuous (stationary and portable oxygen unit needed)   Oxygen conserving device Yes   Oxygen delivery system Gas      06/16/17 1138       Discharge Care Instructions        Start     Ordered   06/17/17 0000  torsemide (DEMADEX) 20 MG tablet  Daily     06/16/17 1049   06/17/17 0000  tiotropium (SPIRIVA) 18 MCG inhalation capsule  Daily     06/16/17 1049   06/16/17 0000  mometasone-formoterol (DULERA) 200-5 MCG/ACT AERO  2 times daily     06/16/17 1049   06/16/17 0000  predniSONE (DELTASONE) 10 MG tablet  As directed     06/16/17 1049   06/16/17 [place]  (Home health needs / face to face )    Question Answer Comment  To provide the following care/treatments [place]   To provide the following care/treatments RN    To provide the following care/treatments Social work      06/16/17 [place]   06/16/17 0000  Face-to-face encounter (required for Medicare/Medicaid patients)  (Home health needs / face to face )  Comments:  Abby Potash certify that this patient is under my care and that I, or a nurse practitioner or physician's assistant working with me, had a face-to-face encounter that meets the physician face-to-face encounter requirements with this patient on 06/16/2017. The encounter with the patient was in whole, or in part for the following medical condition(s) which is the primary reason for home health care  1. Recurrent Respiratory failure  acute on chronic diastolic heart failure   depression   history of COPD Pulmonary hypertension  Question Answer Comment  The encounter with the patient was in whole, or in part, for the following medical condition, which is the primary reason for home health care whole   I certify that, based on my findings, the following services are medically necessary home health services Nursing   Reason for Medically [place] Skilled Nursing- Teaching of Disease Process/Symptom Management   My clinical findings support the need for the above services Unsafe ambulation due to balance issues   Further, I certify that my clinical findings support that this patient is homebound due to: Unable to leave home safely without assistance      06/16/17 1049   06/14/17 0000  AMB referral to pulmonary rehabilitation    Question Answer Comment  Please select a program: Respiratory Care Services   Respiratory Care Services Diagnosis: CHF  Combined CHF with EF 35 - 40%  Program Prescription: O2 Administration by RT, EP, or RN if SpO2<88%      06/14/17 1613        Diet and Activity recommendation: See Discharge Instructions above   Consults obtained - Audiology, critical care   Major procedures and Radiology Reports - PLEASE review detailed and  final reports for all details, in brief -      Dg Chest 2 View  Result Date: 05/28/2017 CLINICAL DATA:  Dyspnea EXAM: CHEST  2 VIEW COMPARISON:  05/07/2017 chest radiograph. FINDINGS: Stable cardiomediastinal silhouette with mild cardiomegaly and aortic atherosclerosis. No pneumothorax. No pleural effusion. Mild pulmonary edema. IMPRESSION: Mild congestive heart failure. Electronically Signed   By: Ilona Sorrel M.D.   On: 05/28/2017 16:33   Ct Angio Chest Pe W And/or Wo Contrast  Result Date: 05/28/2017 CLINICAL DATA:  Increasing dyspnea over the past few days with exertion in particular. History of asthma. EXAM: CT ANGIOGRAPHY CHEST WITH CONTRAST TECHNIQUE: Multidetector CT imaging of the chest was performed using the standard protocol during bolus administration of intravenous contrast. Multiplanar CT image reconstructions and MIPs were obtained to evaluate the vascular anatomy. CONTRAST:  60 cc Isovue 370 IV COMPARISON:  05/28/2017 CXR FINDINGS: Cardiovascular: No acute pulmonary embolus. Aortic atherosclerosis with mild ectasia of the ascending aorta. Cardiomegaly without pericardial effusion or thickening. Coronary arteriosclerosis along the left main, LAD, RCA and circumflex. Mild dilatation of the main pulmonary artery to 3.4 cm consistent with a component of chronic pulmonary hypertension. Mediastinum/Nodes: No enlarged mediastinal, hilar, or axillary lymph nodes. Thyroid gland, trachea, and esophagus demonstrate no significant findings. Lungs/Pleura: Limited by respiratory motion artifacts. There is mild bronchiectasis to both lower lobes and right middle lobe with streaky atelectasis at each lung base. No pneumonic consolidation, effusion or pneumothorax. There appears to be centrilobular emphysema bilaterally. Upper Abdomen: No acute abnormality. Musculoskeletal: No chest wall abnormality. No acute or significant osseous findings. Review of the MIP images confirms the above findings. IMPRESSION:  1. Cardiomegaly with aortic atherosclerosis. 2. Mild centrilobular emphysema. 3. No pulmonary embolus. Mild dilatation of the main pulmonary  artery to 3.4 cm which may reflect chronic pulmonary hypertensive change. 4. Coronary arteriosclerosis. Aortic Atherosclerosis (ICD10-I70.0) and Emphysema (ICD10-J43.9). Electronically Signed   By: Ashley Royalty M.D.   On: 05/28/2017 17:54   Dg Chest Port 1 View  Result Date: 06/14/2017 CLINICAL DATA:  Respiratory failure. EXAM: PORTABLE CHEST 1 VIEW COMPARISON:  06/13/2017. FINDINGS: Two cardiomegaly with mild pulmonary venous congestion. Low lung volumes. Mild bibasilar pulmonary edema cannot be excluded . Small bilateral pleural effusions. Similar findings noted on prior exam. IMPRESSION: 1. Cardiomegaly with bilateral pulmonary venous congestion again noted. 2. Low lung volumes with mild bibasilar atelectasis. Mild bibasilar pulmonary edema cannot be excluded. Small bilateral pleural effusions. No change from prior exam. Electronically Signed   By: Marcello Moores  Register   On: 06/14/2017 06:22   Dg Chest Port 1 View  Result Date: 06/13/2017 CLINICAL DATA:  Acute respiratory failure EXAM: PORTABLE CHEST 1 VIEW COMPARISON:  06/12/2017 FINDINGS: Cardiomegaly with vascular congestion. Bibasilar atelectasis, similar to prior study. Possible small effusions. No overt edema. No acute bony abnormality. IMPRESSION: Cardiomegaly with vascular congestion and bibasilar atelectasis. Question small effusions. No real change. Electronically Signed   By: Rolm Baptise M.D.   On: 06/13/2017 07:32   Dg Chest Portable 1 View  Result Date: 06/12/2017 CLINICAL DATA:  Respiratory distress EXAM: PORTABLE CHEST 1 VIEW COMPARISON:  May 29, 2017 FINDINGS: There is a rather minimal left pleural effusion. There is slight left base atelectasis. There is no edema or consolidation. Heart is enlarged with pulmonary vascularity within normal limits. No adenopathy. No evident bone lesions.  IMPRESSION: Mild left base atelectasis with minimal left pleural effusion. Lungs elsewhere clear. Stable cardiomegaly. Electronically Signed   By: Lowella Grip III M.D.   On: 06/12/2017 00:45   Dg Chest Port 1 View  Result Date: 05/29/2017 CLINICAL DATA:  Asthma and congestive heart failure.  Hypoxia. EXAM: PORTABLE CHEST 1 VIEW COMPARISON:  05/28/2017 chest CT FINDINGS: Mild-to-moderate enlargement of the cardiopericardial silhouette with indistinct pulmonary vasculature and hazy perihilar and basilar opacities favoring progressive pulmonary edema or less likely atypical pneumonia. Thoracic spondylosis. IMPRESSION: 1. Progressive hazy perihilar and basilar opacities favoring edema over atypical pneumonia. Underlying cardiomegaly noted. Electronically Signed   By: Van Clines M.D.   On: 05/29/2017 14:51    Micro Results     Recent Results (from the past 240 hour(s))  Blood culture (routine x 2)     Status: None (Preliminary result)   Collection Time: 06/12/17  2:11 AM  Result Value Ref Range Status   Specimen Description BLOOD LEFT HAND  Final   Special Requests   Final    BOTTLES DRAWN AEROBIC AND ANAEROBIC Blood Culture results may not be optimal due to an inadequate volume of blood received in culture bottles   Culture NO GROWTH 4 DAYS  Final   Report Status PENDING  Incomplete  Blood culture (routine x 2)     Status: None (Preliminary result)   Collection Time: 06/12/17  2:11 AM  Result Value Ref Range Status   Specimen Description BLOOD LEFT ARM  Final   Special Requests   Final    BOTTLES DRAWN AEROBIC AND ANAEROBIC Blood Culture results may not be optimal due to an inadequate volume of blood received in culture bottles   Culture NO GROWTH 4 DAYS  Final   Report Status PENDING  Incomplete  MRSA PCR Screening     Status: None   Collection Time: 06/12/17  5:02 PM  Result Value Ref  Range Status   MRSA by PCR NEGATIVE NEGATIVE Final    Comment:        The GeneXpert MRSA  Assay (FDA approved for NASAL specimens only), is one component of a comprehensive MRSA colonization surveillance program. It is not intended to diagnose MRSA infection nor to guide or monitor treatment for MRSA infections.        Today   Subjective:   Angela Pham today has no headache,no chest abdominal pain,no new weakness tingling or numbness, feels much better wants to go home today.  Objective:   Blood pressure (!) 143/84, pulse 97, temperature 97.7 F (36.5 C), temperature source Oral, resp. rate 20, height 4\' 11"  (1.499 m), weight 87 kg (191 lb 11.2 oz), SpO2 95 %.   Intake/Output Summary (Last 24 hours) at 06/16/17 1144 Last data filed at 06/16/17 1007  Gross per 24 hour  Intake              600 ml  Output              800 ml  Net             -200 ml    Exam Awake Alert, Oriented x 3, No new F.N deficits, Normal affect [place].AT,PERRAL Supple Neck,No JVD, No cervical lymphadenopathy appriciated.  Symmetrical Chest wall movement, Good air movement bilaterally, CTAB RRR,No Gallops,Rubs or new Murmurs, No Parasternal Heave +ve B.Sounds, Abd Soft, Non tender, No organomegaly appriciated, No rebound -guarding or rigidity. No Cyanosis, Clubbing or edema, No new Rash or bruise  Data Review   CBC w Diff: Lab Results  Component Value Date   WBC 20.0 (H) 06/14/2017   HGB 10.6 (L) 06/14/2017   HCT 32.5 (L) 06/14/2017   PLT 260 06/14/2017   LYMPHOPCT 32 05/04/2017   MONOPCT 6 05/04/2017   EOSPCT 2 05/04/2017   BASOPCT 1 05/04/2017    CMP: Lab Results  Component Value Date   NA 143 06/16/2017   K 3.6 06/16/2017   CL 104 06/16/2017   CO2 29 06/16/2017   BUN 46 (H) 06/16/2017   CREATININE 1.38 (H) 06/16/2017   PROT 5.9 (L) 06/13/2017   ALBUMIN 2.9 (L) 06/13/2017   BILITOT 0.6 06/13/2017   ALKPHOS 64 06/13/2017   AST 51 (H) 06/13/2017   ALT 182 (H) 06/13/2017  .   Total Time in preparing paper work, data evaluation and todays exam - 89  minutes  Angela Pham M.D on 06/16/2017 at 11:44 AM    Note: This dictation was prepared with Dragon dictation along with smaller phrase technology. Any transcriptional errors that result from this process are unintentional.

## 2017-06-17 LAB — CULTURE, BLOOD (ROUTINE X 2)
CULTURE: NO GROWTH
Culture: NO GROWTH

## 2017-06-17 NOTE — Telephone Encounter (Signed)
Patient contacted regarding discharge from [facility] on 06/16/2017.  Patient understands to follow up with provider Bernerd Pho PA on 06/23/17 at 14:00 pm at [facility]. Patient understands discharge instructions? Yes Patient understands medications and regiment? Yes Patient understands to bring all medications to this visit? Yes  Patient verbalized understanding of appointment information with no further questions or concerns at this time.

## 2017-06-21 ENCOUNTER — Ambulatory Visit: Payer: Medicare Other | Admitting: Family

## 2017-06-23 ENCOUNTER — Ambulatory Visit (INDEPENDENT_AMBULATORY_CARE_PROVIDER_SITE_OTHER): Payer: Medicare Other | Admitting: Student

## 2017-06-23 ENCOUNTER — Ambulatory Visit: Payer: Medicare Other | Attending: Internal Medicine

## 2017-06-23 ENCOUNTER — Encounter: Payer: Self-pay | Admitting: Pulmonary Disease

## 2017-06-23 ENCOUNTER — Encounter: Payer: Self-pay | Admitting: Student

## 2017-06-23 VITALS — BP 148/60 | HR 92 | Ht 59.0 in | Wt 180.0 lb

## 2017-06-23 DIAGNOSIS — I10 Essential (primary) hypertension: Secondary | ICD-10-CM

## 2017-06-23 DIAGNOSIS — I272 Pulmonary hypertension, unspecified: Secondary | ICD-10-CM | POA: Diagnosis not present

## 2017-06-23 DIAGNOSIS — G4736 Sleep related hypoventilation in conditions classified elsewhere: Secondary | ICD-10-CM | POA: Diagnosis not present

## 2017-06-23 DIAGNOSIS — Z79899 Other long term (current) drug therapy: Secondary | ICD-10-CM

## 2017-06-23 DIAGNOSIS — Z6838 Body mass index (BMI) 38.0-38.9, adult: Secondary | ICD-10-CM | POA: Insufficient documentation

## 2017-06-23 DIAGNOSIS — G4733 Obstructive sleep apnea (adult) (pediatric): Secondary | ICD-10-CM | POA: Insufficient documentation

## 2017-06-23 DIAGNOSIS — I5042 Chronic combined systolic (congestive) and diastolic (congestive) heart failure: Secondary | ICD-10-CM | POA: Diagnosis not present

## 2017-06-23 DIAGNOSIS — N183 Chronic kidney disease, stage 3 unspecified: Secondary | ICD-10-CM

## 2017-06-23 DIAGNOSIS — E782 Mixed hyperlipidemia: Secondary | ICD-10-CM

## 2017-06-23 DIAGNOSIS — G4761 Periodic limb movement disorder: Secondary | ICD-10-CM | POA: Insufficient documentation

## 2017-06-23 DIAGNOSIS — I428 Other cardiomyopathies: Secondary | ICD-10-CM

## 2017-06-23 DIAGNOSIS — E785 Hyperlipidemia, unspecified: Secondary | ICD-10-CM | POA: Insufficient documentation

## 2017-06-23 MED ORDER — SPIRONOLACTONE 25 MG PO TABS: 12.5000 mg | ORAL_TABLET | Freq: Every day | ORAL | 1 refills | Status: DC

## 2017-06-23 MED ORDER — TORSEMIDE 20 MG PO TABS: 40.0000 mg | ORAL_TABLET | Freq: Every day | ORAL | 1 refills | Status: DC

## 2017-06-23 MED ORDER — CARVEDILOL 6.25 MG PO TABS: 6.2500 mg | ORAL_TABLET | Freq: Two times a day (BID) | ORAL | 3 refills | Status: DC

## 2017-06-23 NOTE — Progress Notes (Signed)
Cardiology Office Note    Date:  06/23/2017   ID:  Angela Pham, DOB [birth year], MRN [id number]  PCP:  Cletis Athens, MD  Cardiologist: Dr. Fletcher Anon  Chief Complaint  Patient presents with  . other    Follow up from hospitatlization. Patient denies chest pain and swelling. Patient states she has SOB but thinks it is due to asthma     History of Present Illness:    Angela Pham is a 76 y.o. female with past medical history of chronic combined systolic and diastolic CHF (EF 83-15% by echo in 02/2017, declined to 35-40% in 05/2017), normal cors by cath in 02/2017, pulmonary HTN, Type 2 DM, HTN, HLD, and Stage 3 CKD who presents to the office today for hospital follow-up.   She was recently admitted to [facility] from 9/15 - 06/16/2017 for acute on chronic hypoxic respiratory failure in the setting of acute on chronic CHF exacerbation. BNP was elevated to 303 and repeat limited echo showed an EF of 35-40%. She was diuresed with IV Lasix and weight at the time of discharge was 191 lbs. She was switched to Torsemide 40mg  daily at discharge with creatinine stable at 1.38.  In talking with the patient today, she reports overall doing well since her hospitalization. She reports breathing has been at baseline and notes occasional episodes of wheezing which she equates to asthma diagnosed in childhood. No recent orthopnea, PND, lower extremity edema, chest pain, or palpitations. Does experience occasional episodes of dizziness which has been present since her hospitalization. No associated headaches, vision changes, or presyncope.    She has continued to weigh herself daily and this has trended down to 180 lbs, down 11 lbs since hospital discharge. She has quit adding extra salt to her food and is monitoring the sodium content in her diet, something she had not done prior to her recent admission.     Past Medical History:  Diagnosis Date  . Asthma   . Chronic combined systolic and diastolic CHF (congestive  heart failure) ([place])    a. TTE 02/2017: EF 45-50%, basal amd midanteroseptal HK b. 05/2017: echo showing EF of 35-40%, mild AI, moderate MR, and mildly dilated LA.   [place] Kitchen Coronary artery disease, non-occlusive    a. LHC 02/2017 normal coronary arteries  . Hyperlipidemia   . Hypertension   . Morbid obesity ([place])   . Pulmonary hypertension ([place])   . Type II diabetes mellitus ([place])     Past Surgical History:  Procedure Laterality Date  . c-section    . RIGHT/LEFT HEART CATH AND CORONARY ANGIOGRAPHY N/A 03/09/2017   Procedure: Right/Left Heart Cath and Coronary Angiography;  Surgeon: Minna Merritts, MD;  Location: [facility];  Service: Cardiovascular;  Laterality: N/A;    Current Medications: Outpatient Medications Prior to Visit  Medication Sig Dispense Refill  . ALPRAZolam (XANAX) 0.25 MG tablet Take 1 tablet (0.25 mg total) by mouth 3 (three) times daily. 30 tablet 0  . aspirin EC 81 MG EC tablet Take 1 tablet (81 mg total) by mouth daily.    [place] Kitchen atorvastatin (LIPITOR) 40 MG tablet Take 40 mg by mouth daily.    . Fluticasone-Salmeterol (ADVAIR DISKUS) 250-50 MCG/DOSE AEPB Inhale 1 puff into the lungs 2 (two) times daily. 14 each 0  . hydrALAZINE (APRESOLINE) 25 MG tablet Take 1 tablet (25 mg total) by mouth every 8 (eight) hours. (Patient taking differently: Take 50 mg by mouth daily. ) 90 tablet 0  . Ipratropium-Albuterol (COMBIVENT  IN) Inhale 3 puffs into the lungs every 4 (four) hours as needed.    Mar[place] Kitchen ipratropium-albuterol (DUONEB) 0.5-2.5 (3) MG/3ML SOLN Inhale 3 mLs into the lungs every 4 (four) hours as needed.    . lactulose (CHRONULAC) 10 GM/15ML solution Take 30 g by mouth daily.    Marland Kitchen levocetirizine (XYZAL) 5 MG tablet Take 10 mg by mouth daily.    . metFORMIN (GLUCOPHAGE) 850 MG tablet Take 1 tablet by mouth 2 (two) times daily.    . mirtazapine (REMERON) 45 MG tablet Take 1 tablet by mouth at bedtime.    . montelukast (SINGULAIR) 10 MG tablet Take 1 tablet (10 mg total)  by mouth at bedtime. 30 tablet 0  . Multiple Vitamins-Minerals (EYE VITAMINS PO) Take 1 tablet by mouth daily.    Marland Kitchen umeclidinium bromide (INCRUSE ELLIPTA) 62.5 MCG/INH AEPB Inhale 1 puff into the lungs daily. 7 each 0  . carvedilol (COREG) 3.125 MG tablet Take 1 tablet (3.125 mg total) by mouth 2 (two) times daily with a meal. 30 tablet 0  . ferrous sulfate 325 (65 FE) MG tablet Take 1 tablet (325 mg total) by mouth 2 (two) times daily with a meal. (Patient not taking: Reported on 06/12/2017) 60 tablet 3  . predniSONE (DELTASONE) 10 MG tablet Take 1 tablet (10 mg total) by mouth as directed. (Patient not taking: Reported on 06/23/2017) 3 tablet 0  . spironolactone (ALDACTONE) 25 MG tablet Take 0.5 tablets (12.5 mg total) by mouth daily. 30 tablet 0  . torsemide (DEMADEX) 20 MG tablet Take 2 tablets (40 mg total) by mouth daily. 30 tablet 0   No facility-administered medications prior to visit.      Allergies:   Ace inhibitors and Shellfish allergy   Social History   Social History  . Marital status: Widowed    Spouse name: N/A  . Number of children: N/A  . Years of education: N/A   Social History Main Topics  . Smoking status: Never Smoker  . Smokeless tobacco: Never Used  . Alcohol use No  . Drug use: No  . Sexual activity: No   Other Topics Concern  . None   Social History Narrative  . None     Family History:  The patient's family history includes CAD in her father; Cervical cancer in her sister; Colon cancer in her sister; Leukemia in her brother; Throat cancer in her brother.   Review of Systems:   Please see the history of present illness.     General:  No chills, fever, night sweats or weight changes.  Cardiovascular:  No chest pain, edema, orthopnea, palpitations, paroxysmal nocturnal dyspnea. Positive for dyspnea.  Dermatological: No rash, lesions/masses Respiratory: No cough, dyspnea Urologic: No hematuria, dysuria Abdominal:   No nausea, vomiting, diarrhea,  bright red blood per rectum, melena, or hematemesis Neurologic:  No visual changes, wkns, changes in mental status. Positive for dizziness.   All other systems reviewed and are otherwise negative except as noted above.   Physical Exam:    VS:  BP (!) 148/60 (BP Location: Left Arm, Patient Position: Sitting, Cuff Size: Normal)   Pulse 92   Ht 4\' 11"  (1.499 m)   Wt 180 lb (81.6 kg)   SpO2 92%   BMI 36.36 kg/m    General: Well developed, well nourished female appearing in no acute distress. Head: Normocephalic, atraumatic, sclera non-icteric, no xanthomas, nares are without discharge.  Neck: No carotid bruits. JVD not elevated.  Lungs: Respirations regular and  unlabored, without wheezes or rales.  Heart: Regular rate and rhythm. No S3 or S4.  No murmur, no rubs, or gallops appreciated. Abdomen: Soft, non-tender, non-distended with normoactive bowel sounds. No hepatomegaly. No rebound/guarding. No obvious abdominal masses. Msk:  Strength and tone appear normal for age. No joint deformities or effusions. Extremities: No clubbing or cyanosis. No lower extremity edema.  Distal pedal pulses are 2+ bilaterally. Neuro: Alert and oriented X 3. Moves all extremities spontaneously. No focal deficits noted. Psych:  Responds to questions appropriately with a normal affect. Skin: No rashes or lesions noted  Wt Readings from Last 3 Encounters:  06/23/17 180 lb (81.6 kg)  06/14/17 191 lb 11.2 oz (87 kg)  06/02/17 192 lb 1.6 oz (87.1 kg)     Studies/Labs Reviewed:   EKG:  EKG is not ordered today.   Recent Labs: 03/09/2017: TSH 0.659 06/12/2017: B Natriuretic Peptide 303.0; Magnesium 2.2 06/13/2017: ALT 182 06/14/2017: Hemoglobin 10.6; Platelets 260 06/16/2017: BUN 46; Creatinine, Ser 1.38; Potassium 3.6; Sodium 143   Lipid Panel No results found for: CHOL, TRIG, HDL, CHOLHDL, VLDL, LDLCALC, LDLDIRECT  Additional studies/ records that were reviewed today include:   Echocardiogram:  05/2017 Study Conclusions  - Left ventricle: Systolic function was moderately reduced. The   estimated ejection fraction was in the range of 35% to 40%. - Aortic valve: There was mild regurgitation. - Mitral valve: There was moderate regurgitation. - Left atrium: The atrium was mildly dilated. - Atrial septum: Echo contrast study showed no right-to-left atrial   level shunt, at baseline or with provocation.  Cardiac Catheterization: 02/2017 Coronary angiography:  Coronary dominance: Right  Left mainstem: Large vessel that bifurcates into the LAD and left circumflex, no significant disease noted  Left anterior descending (LAD): Large vessel that extends to the apical region, diagonal branch 2 of moderate size, no significant disease noted  Left circumflex (LCx): Large vessel with OM branch 2, no significant disease noted  Right coronary artery (RCA): Right dominant vessel with PL and PDA, no significant disease noted  Left ventriculography: Not performed as she had recent echo and has underlying CRI  Final Conclusions:  Normal coronary arteries  Assessment:    1. Chronic combined systolic and diastolic heart failure ([place])   2. Nonischemic cardiomyopathy ([place])   3. Essential hypertension   4. Mixed hyperlipidemia   5. Medication management   6. CKD (chronic kidney disease) stage 3, GFR 30-59 ml/min (H[facility])      Plan:   In order of problems listed above:  1. Chronic Combined Systolic and Diastolic CHF/ Nonischemic Cardiomyopathy - the patient has a known history of nonischemic cardiomyopathy, having a reduced EF of 35-40% by her most recent echo with recent cath in 02/2017 showing normal coronary arteries.  - her weight has declined 11 lbs since her recent hospital discharge and she does not appear volume overloaded by physical examination. No recent orthopnea, PND, or lower extremity edema. - currently on Coreg 3.125mg  BID, Spironolactone 12.5mg  daily, and Torsemide 20mg   BID. Not on ACE-I/ARB/ARNI secondary to history of angioedema. Will further titrate Coreg to 6.25mg  BID. She does report episodes of vague dizziness since hospital discharge but HR is in the 90's today and symptoms do not seem entirely related to BB therapy. If unable to tolerate further adjustment of Coreg or if respiratory status worsens, consider switching to more cardioselective Toprol-XL. Will bring back in 1 month for further titration of her BB and/or Spironolactone if BP and kidney function remains stable.  2. HTN - BP is slightly elevated at 148/60 during today's visit. She does not check her BP regularly at home but reports recently enrolling in a BP monitoring program through her insurance provider.  - she will check her BP regularly and report back if BP remains elevated.  - will further titrate Coreg as above. Continue Spironolactone 12.5mg  daily and Hydralazine 25mg  TID.   3. HLD - no recent Lipid Panel on file. Followed by PCP.  - remains on Atorvastatin 40mg  daily.   4. Stage 3 CKD - baseline creatinine 1.4 - 1.5. Stable at 1.38 at the time of hospital discharge.  - recheck BMET today.    Medication Adjustments/Labs and Tests Ordered: Current medicines are reviewed at length with the patient today.  Concerns regarding medicines are outlined above.  Medication changes, Labs and Tests ordered today are listed in the Patient Instructions below. Patient Instructions  Medication Instructions:  Your physician has recommended you make the following change in your medication:  INCREASE Coreg to 6.25mg  twice a day  Labwork: BMET today  Testing/Procedures: None  Follow-Up: Your physician recommends that you schedule a follow-up appointment in: 1 month with Ignacia Bayley, NP, Christell Faith, PA-C or Dr. Rockey Situ.   Any Other Special Instructions Will Be Listed Below (If Applicable).  If you need a refill on your cardiac medications before your next appointment, please call your  pharmacy.   Signed, Erma Heritage, PA-C  06/23/2017 3:06 PM    [facility] [address], [place], [place]  [address] Phone: [phone]; Fax: [phone]  [address] [place], [place] [address] Phone: [phone]

## 2017-06-23 NOTE — Patient Instructions (Addendum)
Medication Instructions:  Your physician has recommended you make the following change in your medication:  INCREASE coreg to 6.25mg  twice a day   Labwork: BMET today  Testing/Procedures: none  Follow-Up: Your physician recommends that you schedule a follow-up appointment in: 1 month with Ignacia Bayley, NP, Christell Faith, PA-C or Dr. Rockey Situ.    Any Other Special Instructions Will Be Listed Below (If Applicable).     If you need a refill on your cardiac medications before your next appointment, please call your pharmacy.

## 2017-06-24 ENCOUNTER — Ambulatory Visit: Payer: 59 | Admitting: Family

## 2017-06-24 ENCOUNTER — Other Ambulatory Visit: Payer: Self-pay

## 2017-06-24 ENCOUNTER — Encounter: Payer: Self-pay | Admitting: Family

## 2017-06-24 ENCOUNTER — Telehealth: Payer: Self-pay | Admitting: Cardiovascular Disease

## 2017-06-24 VITALS — BP 129/44 | HR 92 | Resp 18 | Ht 59.0 in | Wt 182.0 lb

## 2017-06-24 DIAGNOSIS — J441 Chronic obstructive pulmonary disease with (acute) exacerbation: Secondary | ICD-10-CM | POA: Diagnosis not present

## 2017-06-24 DIAGNOSIS — E785 Hyperlipidemia, unspecified: Secondary | ICD-10-CM

## 2017-06-24 DIAGNOSIS — I110 Hypertensive heart disease with heart failure: Secondary | ICD-10-CM | POA: Insufficient documentation

## 2017-06-24 DIAGNOSIS — I2510 Atherosclerotic heart disease of native coronary artery without angina pectoris: Secondary | ICD-10-CM

## 2017-06-24 DIAGNOSIS — J4540 Moderate persistent asthma, uncomplicated: Secondary | ICD-10-CM

## 2017-06-24 DIAGNOSIS — Z79899 Other long term (current) drug therapy: Secondary | ICD-10-CM | POA: Insufficient documentation

## 2017-06-24 DIAGNOSIS — I10 Essential (primary) hypertension: Secondary | ICD-10-CM

## 2017-06-24 DIAGNOSIS — J45909 Unspecified asthma, uncomplicated: Secondary | ICD-10-CM | POA: Insufficient documentation

## 2017-06-24 DIAGNOSIS — I5032 Chronic diastolic (congestive) heart failure: Secondary | ICD-10-CM | POA: Insufficient documentation

## 2017-06-24 DIAGNOSIS — E119 Type 2 diabetes mellitus without complications: Secondary | ICD-10-CM | POA: Insufficient documentation

## 2017-06-24 DIAGNOSIS — Z7984 Long term (current) use of oral hypoglycemic drugs: Secondary | ICD-10-CM | POA: Insufficient documentation

## 2017-06-24 DIAGNOSIS — E1122 Type 2 diabetes mellitus with diabetic chronic kidney disease: Secondary | ICD-10-CM

## 2017-06-24 DIAGNOSIS — I130 Hypertensive heart and chronic kidney disease with heart failure and stage 1 through stage 4 chronic kidney disease, or unspecified chronic kidney disease: Secondary | ICD-10-CM | POA: Diagnosis not present

## 2017-06-24 DIAGNOSIS — Z7982 Long term (current) use of aspirin: Secondary | ICD-10-CM | POA: Insufficient documentation

## 2017-06-24 DIAGNOSIS — N184 Chronic kidney disease, stage 4 (severe): Secondary | ICD-10-CM

## 2017-06-24 DIAGNOSIS — I5042 Chronic combined systolic (congestive) and diastolic (congestive) heart failure: Secondary | ICD-10-CM

## 2017-06-24 LAB — BASIC METABOLIC PANEL
BUN / CREAT RATIO: 20 (ref 12–28)
BUN: 37 mg/dL — AB (ref 8–27)
CO2: 27 mmol/L (ref 20–29)
CREATININE: 1.86 mg/dL — AB (ref 0.57–1.00)
Calcium: 9.1 mg/dL (ref 8.7–10.3)
Chloride: 99 mmol/L (ref 96–106)
GFR calc non Af Amer: 26 mL/min/{1.73_m2} — ABNORMAL LOW (ref >59)
GFR, EST AFRICAN AMERICAN: 30 mL/min/{1.73_m2} — AB (ref >59)
GLUCOSE: 100 mg/dL — AB (ref 65–99)
Potassium: 4.5 mmol/L (ref 3.5–5.2)
SODIUM: 144 mmol/L (ref 134–144)

## 2017-06-24 LAB — GLUCOSE, CAPILLARY: Glucose-Capillary: 183 mg/dL — ABNORMAL HIGH (ref 65–99)

## 2017-06-24 MED ORDER — TORSEMIDE 20 MG PO TABS: ORAL_TABLET | ORAL | 3 refills | Status: DC

## 2017-06-24 NOTE — Telephone Encounter (Signed)
Pt is return the call regarding lab results.

## 2017-06-24 NOTE — Patient Instructions (Signed)
Continue weighing daily and call for an overnight weight gain of > 2 pounds or a weekly weight gain of >5 pounds. 

## 2017-06-24 NOTE — Progress Notes (Signed)
Angela Pham ID: Angela Pham, female    DOB: [birth year], 76 y.o.   MRN: [id number]  HPI  Angela Pham is a 76 y/o female with a history of asthma, CAD, DM, hyperlipidemia, HTN and chronic heart failure.   Reviewed echo report from 03/08/17 which showed an EF of 45-50% along with moderate MR and mild AR. Systolic pressure was moderately increased to 55-60 mm Hg. Right and left cardiac catheterization done 03/09/17 which resulted in normal coronary arteries. Markedly elevated LVEDP.   Admitted 06/11/17 due to nosocomial pneumonia along with elevated troponin and COPD exacerbation. Cardiology consult was obtained. Initially needed bipap and then transitioned to nasal cannula. Elevated troponin thought to be due to demand ischemia. Discharged home after 5 days. Admitted 05/28/17 due to COPD exacerbation. Medications were adjusted and she was discharged after 5 days. Admitted 05/04/17 due to asthma exacerbation, acute bronchitis and HF exacerbation. Initially needed bipap and then transitioned to nasal cannula. IV solu-medrol initially needed as well along with nebulizer. Continue antibiotics for bronchitis. Nephrology consult obtained. Discharged home after 4 days.   She presents today for her initial visit with a chief complaint of moderate shortness of breath with minimal exertion. She describes this as chronic in nature having been present for several years with varying levels of severity. She has associated fatigue, wheezing and light-headedness along with this. She denies any chest pain, edema, palpitations, difficulty sleeping or weight gain.   Past Medical History:  Diagnosis Date  . Asthma   . Chronic combined systolic and diastolic CHF (congestive heart failure) (Angela Pham)    a. TTE 02/2017: EF 45-50%, basal amd midanteroseptal HK b. 05/2017: echo showing EF of 35-40%, mild AI, moderate MR, and mildly dilated LA.   Angela Pham Coronary artery disease, non-occlusive    a. LHC 02/2017 normal coronary arteries  .  Hyperlipidemia   . Hypertension   . Morbid obesity ([place])   . Pulmonary hypertension (Angela Pham)   . Type II diabetes mellitus (Angela Pham)    Past Surgical History:  Procedure Laterality Date  . c-section    . RIGHT/LEFT HEART CATH AND CORONARY ANGIOGRAPHY N/A 03/09/2017   Procedure: Right/Left Heart Cath and Coronary Angiography;  Surgeon: Minna Merritts, MD;  Location: [facility];  Service: Cardiovascular;  Laterality: N/A;   Family History  Problem Relation Age of Onset  . CAD Father   . Colon cancer Sister   . Leukemia Brother   . Throat cancer Brother   . Cervical cancer Sister    Social History  Substance Use Topics  . Smoking status: Never Smoker  . Smokeless tobacco: Never Used  . Alcohol use No   Allergies  Allergen Reactions  . Ace Inhibitors Anaphylaxis    Tongue swelling  . Shellfish Allergy Hives and Swelling    Swelling around face and mouth    Prior to Admission medications   Medication Sig Start Date End Date Taking? Authorizing Provider  aspirin EC 81 MG EC tablet Take 1 tablet (81 mg total) by mouth daily. 03/12/17  Yes Mody, Ulice Bold, MD  atorvastatin (LIPITOR) 40 MG tablet Take 40 mg by mouth daily.   Yes [provider]  carvedilol (COREG) 6.25 MG tablet Take 1 tablet (6.25 mg total) by mouth 2 (two) times daily. 06/23/17  Yes Strader, [place] M, PA-C  Fluticasone-Salmeterol (ADVAIR DISKUS) 250-50 MCG/DOSE AEPB Inhale 1 puff into the lungs 2 (two) times daily. 06/16/17 06/16/18 Yes Epifanio Lesches, MD  hydrALAZINE (APRESOLINE) 25 MG tablet Take 1  tablet (25 mg total) by mouth every 8 (eight) hours. Angela Pham taking differently: Take 25 mg by mouth 3 (three) times daily.  05/08/17  Yes Vaughan Basta, MD  Ipratropium-Albuterol (COMBIVENT IN) Inhale 3 puffs into the lungs every 4 (four) hours as needed.   Yes [provider]  ipratropium-albuterol (DUONEB) 0.5-2.5 (3) MG/3ML SOLN Inhale 3 mLs into the lungs every 4 (four) hours as  needed.   Yes [provider]  levocetirizine (XYZAL) 5 MG tablet Take 10 mg by mouth daily. 03/18/17  Yes [provider]  metFORMIN (GLUCOPHAGE) 850 MG tablet Take 1 tablet by mouth 2 (two) times daily.   Yes [provider]  mirtazapine (REMERON) 45 MG tablet Take 1 tablet by mouth at bedtime.   Yes [provider]  montelukast (SINGULAIR) 10 MG tablet Take 1 tablet (10 mg total) by mouth at bedtime. 03/11/17  Yes Mody, Ulice Bold, MD  Multiple Vitamins-Minerals (EYE VITAMINS PO) Take 1 tablet by mouth 2 (two) times daily.    Yes [provider]  spironolactone (ALDACTONE) 25 MG tablet Take 0.5 tablets (12.5 mg total) by mouth daily. 06/23/17  Yes Strader, Fransisco Hertz, PA-C  torsemide (DEMADEX) 20 MG tablet Take 20 mg by mouth 2 (two) times daily.   Yes [provider]  umeclidinium bromide (INCRUSE ELLIPTA) 62.5 MCG/INH AEPB Inhale 1 puff into the lungs daily. 06/16/17  Yes Epifanio Lesches, MD  lactulose (CHRONULAC) 10 GM/15ML solution Take 30 g by mouth daily.    [provider]   Review of Systems  Constitutional: Positive for fatigue. Negative for appetite change.  HENT: Negative for congestion, postnasal drip and sore throat.   Eyes: Negative.   Respiratory: Positive for shortness of breath and wheezing (minimal). Negative for chest tightness.   Cardiovascular: Negative for chest pain, palpitations and leg swelling.  Gastrointestinal: Negative for abdominal distention and abdominal pain.  Endocrine: Negative.   Genitourinary: Negative.   Musculoskeletal: Negative for back pain and neck pain.  Skin: Negative.   Allergic/Immunologic: Negative.   Neurological: Positive for light-headedness. Negative for dizziness.  Hematological: Negative for adenopathy. Does not bruise/bleed easily.  Psychiatric/Behavioral: Negative for dysphoric mood and sleep disturbance. The Angela Pham is not nervous/anxious.    Vitals:   06/24/17 1154  BP:  (!) 129/44  Pulse: 92  Resp: 18  SpO2: 96%  Weight: 182 lb (82.6 kg)  Height: 4\' 11"  (1.499 m)   Wt Readings from Last 3 Encounters:  06/24/17 182 lb (82.6 kg)  06/23/17 180 lb (81.6 kg)  06/14/17 191 lb 11.2 oz (87 kg)   Lab Results  Component Value Date   CREATININE 1.86 (H) 06/23/2017   CREATININE 1.38 (H) 06/16/2017   CREATININE 1.46 (H) 06/15/2017   Physical Exam  Constitutional: She is oriented to person, place, and time. She appears well-developed and well-nourished.  HENT:  Head: Normocephalic and atraumatic.  Neck: Normal range of motion. Neck supple. No JVD present.  Cardiovascular: Regular rhythm.  Tachycardia present.   Pulmonary/Chest: Effort normal. No respiratory distress. She has wheezes in the right upper field, the right lower field, the left upper field and the left lower field. She has no rales.  Abdominal: Soft. She exhibits no distension. There is no tenderness.  Musculoskeletal: She exhibits no edema or tenderness.  Neurological: She is alert and oriented to person, place, and time.  Skin: Skin is warm and dry.  Psychiatric: She has a normal mood and affect. Her behavior is normal. Thought content normal.  Nursing note and vitals reviewed.   Assessment & Plan:  1: Chronic heart failure with preserved ejection fraction- - NYHA class III - euvolemic today - already weighing daily; instructed to call for an overnight weight gain of >2 pounds or a weekly weight gain of >5 pounds - not adding salt and is trying to read food labels. Discussed the importance of closely following a 2000mg  sodium diet and written dietary information was given to her about this - received her flu/pneumonia vaccines during most recent hospitalization - saw cardiology PA yesterday and had lab work drawn and is waiting on a call back regarding the results; returns November 2018 - carvedilol increased yesterday to 6.25mg  BID - PharmD went in and reviewed medications with the  Angela Pham - sleep study done last night and she says that she didn't sleep "at all" - no ReDS vest reading due to BMI  2: Asthma- - wheezing heard throughout  - does wear oxygen at 2L at bedtime - has inhalers and nebulizer that she uses - follows with pulmonologist Alva Garnet) and she sees him 07/06/17  3: HTN- - BP looks good today - continue medications at this time - reviewed BMP from yesterday (06/23/17) which showed sodium 144, potassium 4.5 and GFR decreased to 26. Discussed possibly decreasing her torsemide but will defer to cardiology PA who ordered the labs  4: Diabetes- - nonfasting glucose in clinic today was 183 after she ate frosted flakes for breakfast - doesn't check her glucose daily and follows with PCP regarding this - HgA1c on 03/09/17 was 5.8% - should renal function continue to decline, may need to stop metformin  Angela Pham did not bring her medications nor a list. Each medication was verbally reviewed with the Angela Pham and she was encouraged to bring the bottles to every visit to confirm accuracy of list.  Return in 2 weeks or sooner for any questions/problems before then. Requests that appointments be made to coordinate on the same days as either her pulmonologist or cardiologist due to transportation issues.

## 2017-06-24 NOTE — Telephone Encounter (Signed)
See lab results note.

## 2017-06-27 ENCOUNTER — Encounter: Payer: Self-pay | Admitting: Emergency Medicine

## 2017-06-27 ENCOUNTER — Emergency Department: Payer: 59

## 2017-06-27 ENCOUNTER — Inpatient Hospital Stay
Admission: EM | Admit: 2017-06-27 | Discharge: 2017-07-04 | DRG: 291 | Disposition: A | Discharge status: Home Health | Payer: 59 | Attending: Internal Medicine | Admitting: Internal Medicine

## 2017-06-27 DIAGNOSIS — I5023 Acute on chronic systolic (congestive) heart failure: Secondary | ICD-10-CM

## 2017-06-27 DIAGNOSIS — N179 Acute kidney failure, unspecified: Secondary | ICD-10-CM | POA: Diagnosis present

## 2017-06-27 DIAGNOSIS — I130 Hypertensive heart and chronic kidney disease with heart failure and stage 1 through stage 4 chronic kidney disease, or unspecified chronic kidney disease: Principal | ICD-10-CM | POA: Diagnosis present

## 2017-06-27 DIAGNOSIS — Y92230 Patient room in hospital as the place of occurrence of the external cause: Secondary | ICD-10-CM | POA: Diagnosis present

## 2017-06-27 DIAGNOSIS — E785 Hyperlipidemia, unspecified: Secondary | ICD-10-CM | POA: Diagnosis present

## 2017-06-27 DIAGNOSIS — J9602 Acute respiratory failure with hypercapnia: Secondary | ICD-10-CM | POA: Diagnosis not present

## 2017-06-27 DIAGNOSIS — N183 Chronic kidney disease, stage 3 (moderate): Secondary | ICD-10-CM | POA: Diagnosis present

## 2017-06-27 DIAGNOSIS — I248 Other forms of acute ischemic heart disease: Secondary | ICD-10-CM | POA: Diagnosis present

## 2017-06-27 DIAGNOSIS — J9621 Acute and chronic respiratory failure with hypoxia: Secondary | ICD-10-CM | POA: Diagnosis present

## 2017-06-27 DIAGNOSIS — Z6837 Body mass index (BMI) 37.0-37.9, adult: Secondary | ICD-10-CM | POA: Diagnosis not present

## 2017-06-27 DIAGNOSIS — Z7984 Long term (current) use of oral hypoglycemic drugs: Secondary | ICD-10-CM

## 2017-06-27 DIAGNOSIS — I272 Pulmonary hypertension, unspecified: Secondary | ICD-10-CM | POA: Diagnosis present

## 2017-06-27 DIAGNOSIS — Z888 Allergy status to other drugs, medicaments and biological substances status: Secondary | ICD-10-CM

## 2017-06-27 DIAGNOSIS — Z79899 Other long term (current) drug therapy: Secondary | ICD-10-CM

## 2017-06-27 DIAGNOSIS — E1122 Type 2 diabetes mellitus with diabetic chronic kidney disease: Secondary | ICD-10-CM | POA: Diagnosis present

## 2017-06-27 DIAGNOSIS — E669 Obesity, unspecified: Secondary | ICD-10-CM | POA: Diagnosis present

## 2017-06-27 DIAGNOSIS — I447 Left bundle-branch block, unspecified: Secondary | ICD-10-CM | POA: Diagnosis present

## 2017-06-27 DIAGNOSIS — G4733 Obstructive sleep apnea (adult) (pediatric): Secondary | ICD-10-CM | POA: Diagnosis not present

## 2017-06-27 DIAGNOSIS — Z91013 Allergy to seafood: Secondary | ICD-10-CM | POA: Diagnosis not present

## 2017-06-27 DIAGNOSIS — Z9981 Dependence on supplemental oxygen: Secondary | ICD-10-CM | POA: Diagnosis not present

## 2017-06-27 DIAGNOSIS — R000 Tachycardia, unspecified: Secondary | ICD-10-CM | POA: Diagnosis present

## 2017-06-27 DIAGNOSIS — Z806 Family history of leukemia: Secondary | ICD-10-CM

## 2017-06-27 DIAGNOSIS — I080 Rheumatic disorders of both mitral and aortic valves: Secondary | ICD-10-CM | POA: Diagnosis present

## 2017-06-27 DIAGNOSIS — Z9119 Patient's noncompliance with other medical treatment and regimen: Secondary | ICD-10-CM

## 2017-06-27 DIAGNOSIS — I5043 Acute on chronic combined systolic (congestive) and diastolic (congestive) heart failure: Secondary | ICD-10-CM | POA: Diagnosis present

## 2017-06-27 DIAGNOSIS — T380X5A Adverse effect of glucocorticoids and synthetic analogues, initial encounter: Secondary | ICD-10-CM | POA: Diagnosis present

## 2017-06-27 DIAGNOSIS — F419 Anxiety disorder, unspecified: Secondary | ICD-10-CM | POA: Diagnosis present

## 2017-06-27 DIAGNOSIS — Z66 Do not resuscitate: Secondary | ICD-10-CM | POA: Diagnosis present

## 2017-06-27 DIAGNOSIS — J9601 Acute respiratory failure with hypoxia: Secondary | ICD-10-CM

## 2017-06-27 DIAGNOSIS — Z800 Family history of malignant neoplasm of digestive organs: Secondary | ICD-10-CM

## 2017-06-27 DIAGNOSIS — Z8249 Family history of ischemic heart disease and other diseases of the circulatory system: Secondary | ICD-10-CM | POA: Diagnosis not present

## 2017-06-27 DIAGNOSIS — J441 Chronic obstructive pulmonary disease with (acute) exacerbation: Secondary | ICD-10-CM

## 2017-06-27 DIAGNOSIS — G4730 Sleep apnea, unspecified: Secondary | ICD-10-CM | POA: Diagnosis present

## 2017-06-27 DIAGNOSIS — E1165 Type 2 diabetes mellitus with hyperglycemia: Secondary | ICD-10-CM | POA: Diagnosis present

## 2017-06-27 DIAGNOSIS — I429 Cardiomyopathy, unspecified: Secondary | ICD-10-CM | POA: Diagnosis present

## 2017-06-27 DIAGNOSIS — J9690 Respiratory failure, unspecified, unspecified whether with hypoxia or hypercapnia: Secondary | ICD-10-CM | POA: Diagnosis present

## 2017-06-27 DIAGNOSIS — J9622 Acute and chronic respiratory failure with hypercapnia: Secondary | ICD-10-CM | POA: Diagnosis present

## 2017-06-27 DIAGNOSIS — Z7982 Long term (current) use of aspirin: Secondary | ICD-10-CM

## 2017-06-27 DIAGNOSIS — Z808 Family history of malignant neoplasm of other organs or systems: Secondary | ICD-10-CM

## 2017-06-27 DIAGNOSIS — I2510 Atherosclerotic heart disease of native coronary artery without angina pectoris: Secondary | ICD-10-CM | POA: Diagnosis present

## 2017-06-27 DIAGNOSIS — Z7951 Long term (current) use of inhaled steroids: Secondary | ICD-10-CM

## 2017-06-27 DIAGNOSIS — Z8049 Family history of malignant neoplasm of other genital organs: Secondary | ICD-10-CM

## 2017-06-27 LAB — LACTIC ACID, PLASMA: Lactic Acid, Venous: 1.4 mmol/L (ref 0.5–1.9)

## 2017-06-27 LAB — CBC WITH DIFFERENTIAL/PLATELET
Basophils Absolute: 0.1 10*3/uL (ref 0–0.1)
Basophils Relative: 1 %
EOS PCT: 3 %
Eosinophils Absolute: 0.4 10*3/uL (ref 0–0.7)
HEMATOCRIT: 33.4 % — AB (ref 35.0–47.0)
HEMOGLOBIN: 10.8 g/dL — AB (ref 12.0–16.0)
LYMPHS ABS: 4.8 10*3/uL — AB (ref 1.0–3.6)
LYMPHS PCT: 28 %
MCH: 29.4 pg (ref 26.0–34.0)
MCHC: 32.3 g/dL (ref 32.0–36.0)
MCV: 91.1 fL (ref 80.0–100.0)
Monocytes Absolute: 1 10*3/uL — ABNORMAL HIGH (ref 0.2–0.9)
Monocytes Relative: 6 %
NEUTROS ABS: 10.6 10*3/uL — AB (ref 1.4–6.5)
Neutrophils Relative %: 62 %
PLATELETS: 296 10*3/uL (ref 150–440)
RBC: 3.66 MIL/uL — AB (ref 3.80–5.20)
RDW: 17.1 % — ABNORMAL HIGH (ref 11.5–14.5)
WBC: 17 10*3/uL — AB (ref 3.6–11.0)

## 2017-06-27 LAB — COMPREHENSIVE METABOLIC PANEL
ALK PHOS: 74 U/L (ref 38–126)
ALT: 57 U/L — AB (ref 14–54)
AST: 82 U/L — ABNORMAL HIGH (ref 15–41)
Albumin: 2.9 g/dL — ABNORMAL LOW (ref 3.5–5.0)
Anion gap: 9 (ref 5–15)
BILIRUBIN TOTAL: 0.4 mg/dL (ref 0.3–1.2)
BUN: 38 mg/dL — AB (ref 6–20)
CALCIUM: 8.5 mg/dL — AB (ref 8.9–10.3)
CO2: 29 mmol/L (ref 22–32)
CREATININE: 1.98 mg/dL — AB (ref 0.44–1.00)
Chloride: 106 mmol/L (ref 101–111)
GFR, EST AFRICAN AMERICAN: 27 mL/min — AB (ref >60)
GFR, EST NON AFRICAN AMERICAN: 24 mL/min — AB (ref >60)
Glucose, Bld: 245 mg/dL — ABNORMAL HIGH (ref 65–99)
Potassium: 4.8 mmol/L (ref 3.5–5.1)
Sodium: 144 mmol/L (ref 135–145)
Total Protein: 6.4 g/dL — ABNORMAL LOW (ref 6.5–8.1)

## 2017-06-27 LAB — BLOOD GAS, VENOUS
ACID-BASE EXCESS: 0.6 mmol/L (ref 0.0–2.0)
Bicarbonate: 31.4 mmol/L — ABNORMAL HIGH (ref 20.0–28.0)
Delivery systems: POSITIVE
FIO2: 1
LHR: 30 {breaths}/min
Mechanical Rate: 8
O2 SAT: UNDETERMINED %
PCO2 VEN: 90 mmHg — AB (ref 44.0–60.0)
PEEP/CPAP: 5 cmH2O
PO2 VEN: UNDETERMINED mmHg (ref 32.0–45.0)
PRESSURE SUPPORT: 20 cmH2O
Patient temperature: 37
pH, Ven: 7.15 — CL (ref 7.250–7.430)

## 2017-06-27 LAB — TROPONIN I
TROPONIN I: 0.06 ng/mL — AB (ref <0.03)
Troponin I: 0.05 ng/mL (ref <0.03)
Troponin I: 0.07 ng/mL (ref <0.03)
Troponin I: 0.05 ng/mL (ref <0.03)

## 2017-06-27 LAB — GLUCOSE, CAPILLARY
GLUCOSE-CAPILLARY: 216 mg/dL — AB (ref 65–99)
GLUCOSE-CAPILLARY: 251 mg/dL — AB (ref 65–99)
GLUCOSE-CAPILLARY: 208 mg/dL — AB (ref 65–99)
Glucose-Capillary: 177 mg/dL — ABNORMAL HIGH (ref 65–99)

## 2017-06-27 LAB — BRAIN NATRIURETIC PEPTIDE: B NATRIURETIC PEPTIDE 5: 272 pg/mL — AB (ref 0.0–100.0)

## 2017-06-27 LAB — PROCALCITONIN: Procalcitonin: 0.47 ng/mL

## 2017-06-27 MED ORDER — PIPERACILLIN-TAZOBACTAM 3.375 G IVPB
3.3750 g | Freq: Three times a day (TID) | INTRAVENOUS | Status: DC
  Administered 2017-06-27 – 2017-06-28 (×3): 3.375 g via INTRAVENOUS
  Filled 2017-06-27 (×2): qty 50

## 2017-06-27 MED ORDER — HYDRALAZINE HCL 50 MG PO TABS
25.0000 mg | ORAL_TABLET | Freq: Three times a day (TID) | ORAL | Status: DC
  Filled 2017-06-27: qty 1

## 2017-06-27 MED ORDER — SODIUM CHLORIDE 0.9 % IV SOLN: 250.0000 mL | INTRAVENOUS | Status: DC | PRN

## 2017-06-27 MED ORDER — MOMETASONE FURO-FORMOTEROL FUM 200-5 MCG/ACT IN AERO
2.0000 | INHALATION_SPRAY | Freq: Two times a day (BID) | RESPIRATORY_TRACT | Status: DC
  Administered 2017-06-27 – 2017-07-03 (×13): 2 via RESPIRATORY_TRACT
  Filled 2017-06-27: qty 8.8

## 2017-06-27 MED ORDER — TORSEMIDE 20 MG PO TABS
20.0000 mg | ORAL_TABLET | Freq: Every day | ORAL | Status: DC
  Administered 2017-06-27 – 2017-07-04 (×8): 20 mg via ORAL
  Filled 2017-06-27 (×8): qty 1

## 2017-06-27 MED ORDER — INSULIN ASPART 100 UNIT/ML ~~LOC~~ SOLN
0.0000 [IU] | Freq: Three times a day (TID) | SUBCUTANEOUS | Status: DC
  Administered 2017-06-27: 8 [IU] via SUBCUTANEOUS
  Administered 2017-06-27 – 2017-06-28 (×3): 5 [IU] via SUBCUTANEOUS
  Administered 2017-06-28 – 2017-06-29 (×3): 3 [IU] via SUBCUTANEOUS
  Administered 2017-06-30: 5 [IU] via SUBCUTANEOUS
  Administered 2017-06-30: 8 [IU] via SUBCUTANEOUS
  Administered 2017-07-01: 3 [IU] via SUBCUTANEOUS
  Administered 2017-07-01: 8 [IU] via SUBCUTANEOUS
  Administered 2017-07-01: 2 [IU] via SUBCUTANEOUS
  Administered 2017-07-02: 3 [IU] via SUBCUTANEOUS
  Administered 2017-07-02: 11 [IU] via SUBCUTANEOUS
  Administered 2017-07-02: 8 [IU] via SUBCUTANEOUS
  Administered 2017-07-03: 3 [IU] via SUBCUTANEOUS
  Administered 2017-07-03: 8 [IU] via SUBCUTANEOUS
  Administered 2017-07-03: 3 [IU] via SUBCUTANEOUS
  Administered 2017-07-04: 5 [IU] via SUBCUTANEOUS
  Filled 2017-06-27 (×19): qty 1

## 2017-06-27 MED ORDER — CARVEDILOL 6.25 MG PO TABS
6.2500 mg | ORAL_TABLET | Freq: Two times a day (BID) | ORAL | Status: DC
  Administered 2017-06-27: 6.25 mg via ORAL
  Filled 2017-06-27: qty 1

## 2017-06-27 MED ORDER — LEVOCETIRIZINE DIHYDROCHLORIDE 5 MG PO TABS: 10.0000 mg | ORAL_TABLET | Freq: Every day | ORAL | Status: DC

## 2017-06-27 MED ORDER — MAGNESIUM SULFATE 2 GM/50ML IV SOLN
2.0000 g | Freq: Once | INTRAVENOUS | Status: AC
  Administered 2017-06-27: 2 g via INTRAVENOUS

## 2017-06-27 MED ORDER — SODIUM CHLORIDE 0.9 % IV BOLUS (SEPSIS)
1000.0000 mL | Freq: Once | INTRAVENOUS | Status: AC
  Administered 2017-06-27: 1000 mL via INTRAVENOUS

## 2017-06-27 MED ORDER — CHLORHEXIDINE GLUCONATE 0.12 % MT SOLN
15.0000 mL | Freq: Two times a day (BID) | OROMUCOSAL | Status: DC
  Administered 2017-06-27 – 2017-06-29 (×3): 15 mL via OROMUCOSAL
  Filled 2017-06-27 (×2): qty 15

## 2017-06-27 MED ORDER — SENNOSIDES-DOCUSATE SODIUM 8.6-50 MG PO TABS: 1.0000 | ORAL_TABLET | Freq: Every evening | ORAL | Status: DC | PRN

## 2017-06-27 MED ORDER — FUROSEMIDE 10 MG/ML IJ SOLN
40.0000 mg | Freq: Two times a day (BID) | INTRAMUSCULAR | Status: DC
  Administered 2017-06-27: 40 mg via INTRAVENOUS
  Filled 2017-06-27: qty 4

## 2017-06-27 MED ORDER — SPIRONOLACTONE 25 MG PO TABS
12.5000 mg | ORAL_TABLET | Freq: Every day | ORAL | Status: DC
  Administered 2017-06-27: 12.5 mg via ORAL
  Filled 2017-06-27: qty 1

## 2017-06-27 MED ORDER — CARVEDILOL 6.25 MG PO TABS: 6.2500 mg | ORAL_TABLET | Freq: Two times a day (BID) | ORAL | Status: DC

## 2017-06-27 MED ORDER — METHYLPREDNISOLONE SODIUM SUCC 125 MG IJ SOLR
60.0000 mg | Freq: Four times a day (QID) | INTRAMUSCULAR | Status: DC
Start: 2017-06-27 — End: 2017-06-27
  Administered 2017-06-27 (×2): 60 mg via INTRAVENOUS
  Filled 2017-06-27 (×2): qty 2

## 2017-06-27 MED ORDER — OCUVITE-LUTEIN PO CAPS
1.0000 | ORAL_CAPSULE | Freq: Two times a day (BID) | ORAL | Status: DC
  Administered 2017-06-27 – 2017-07-04 (×13): 1 via ORAL
  Filled 2017-06-27 (×17): qty 1

## 2017-06-27 MED ORDER — SODIUM CHLORIDE 0.9% FLUSH
3.0000 mL | INTRAVENOUS | Status: DC | PRN
  Administered 2017-06-28: 3 mL via INTRAVENOUS
  Filled 2017-06-27: qty 3

## 2017-06-27 MED ORDER — INSULIN ASPART 100 UNIT/ML ~~LOC~~ SOLN
0.0000 [IU] | Freq: Every day | SUBCUTANEOUS | Status: DC
  Administered 2017-07-01: 2 [IU] via SUBCUTANEOUS
  Administered 2017-07-02 – 2017-07-03 (×2): 5 [IU] via SUBCUTANEOUS
  Filled 2017-06-27 (×3): qty 1

## 2017-06-27 MED ORDER — PIPERACILLIN-TAZOBACTAM 3.375 G IVPB
3.3750 g | Freq: Two times a day (BID) | INTRAVENOUS | Status: DC
  Administered 2017-06-27: 3.375 g via INTRAVENOUS
  Filled 2017-06-27 (×2): qty 50

## 2017-06-27 MED ORDER — UMECLIDINIUM BROMIDE 62.5 MCG/INH IN AEPB
1.0000 | INHALATION_SPRAY | Freq: Every day | RESPIRATORY_TRACT | Status: DC
  Administered 2017-06-27 – 2017-07-04 (×8): 1 via RESPIRATORY_TRACT
  Filled 2017-06-27: qty 7

## 2017-06-27 MED ORDER — HYDRALAZINE HCL 25 MG PO TABS
25.0000 mg | ORAL_TABLET | Freq: Two times a day (BID) | ORAL | Status: DC
  Administered 2017-06-27 – 2017-07-04 (×14): 25 mg via ORAL
  Filled 2017-06-27 (×14): qty 1

## 2017-06-27 MED ORDER — MIRTAZAPINE 15 MG PO TABS
45.0000 mg | ORAL_TABLET | Freq: Every day | ORAL | Status: DC
  Administered 2017-06-27 – 2017-07-03 (×7): 45 mg via ORAL
  Filled 2017-06-27 (×7): qty 3

## 2017-06-27 MED ORDER — ONDANSETRON HCL 4 MG PO TABS: 4.0000 mg | ORAL_TABLET | Freq: Four times a day (QID) | ORAL | Status: DC | PRN

## 2017-06-27 MED ORDER — METHYLPREDNISOLONE SODIUM SUCC 125 MG IJ SOLR
60.0000 mg | Freq: Two times a day (BID) | INTRAMUSCULAR | Status: DC
Start: 2017-06-27 — End: 2017-06-28
  Administered 2017-06-27 – 2017-06-28 (×2): 60 mg via INTRAVENOUS
  Filled 2017-06-27 (×2): qty 2

## 2017-06-27 MED ORDER — BUDESONIDE 0.25 MG/2ML IN SUSP
0.2500 mg | Freq: Two times a day (BID) | RESPIRATORY_TRACT | Status: DC
  Administered 2017-06-27: 0.25 mg via RESPIRATORY_TRACT
  Filled 2017-06-27: qty 2

## 2017-06-27 MED ORDER — ACETAMINOPHEN 325 MG PO TABS: 650.0000 mg | ORAL_TABLET | Freq: Four times a day (QID) | ORAL | Status: DC | PRN

## 2017-06-27 MED ORDER — ACETAMINOPHEN 650 MG RE SUPP: 650.0000 mg | Freq: Four times a day (QID) | RECTAL | Status: DC | PRN

## 2017-06-27 MED ORDER — ORAL CARE MOUTH RINSE: 15.0000 mL | Freq: Two times a day (BID) | OROMUCOSAL | Status: DC

## 2017-06-27 MED ORDER — ASPIRIN EC 81 MG PO TBEC
81.0000 mg | DELAYED_RELEASE_TABLET | Freq: Every day | ORAL | Status: DC
  Administered 2017-06-27 – 2017-07-04 (×8): 81 mg via ORAL
  Filled 2017-06-27 (×8): qty 1

## 2017-06-27 MED ORDER — VANCOMYCIN HCL IN DEXTROSE 1-5 GM/200ML-% IV SOLN
1000.0000 mg | Freq: Once | INTRAVENOUS | Status: AC
  Administered 2017-06-27: 1000 mg via INTRAVENOUS
  Filled 2017-06-27: qty 200

## 2017-06-27 MED ORDER — HEPARIN SODIUM (PORCINE) 5000 UNIT/ML IJ SOLN
5000.0000 [IU] | Freq: Three times a day (TID) | INTRAMUSCULAR | Status: DC
  Administered 2017-06-27 – 2017-07-02 (×18): 5000 [IU] via SUBCUTANEOUS
  Filled 2017-06-27 (×20): qty 1

## 2017-06-27 MED ORDER — CETIRIZINE HCL 10 MG PO TABS
10.0000 mg | ORAL_TABLET | Freq: Every day | ORAL | Status: DC
  Administered 2017-06-27 – 2017-07-04 (×8): 10 mg via ORAL
  Filled 2017-06-27 (×8): qty 1

## 2017-06-27 MED ORDER — METFORMIN HCL 850 MG PO TABS
850.0000 mg | ORAL_TABLET | Freq: Two times a day (BID) | ORAL | Status: DC
Start: 2017-06-27 — End: 2017-06-27

## 2017-06-27 MED ORDER — ATORVASTATIN CALCIUM 20 MG PO TABS
40.0000 mg | ORAL_TABLET | Freq: Every day | ORAL | Status: DC
  Administered 2017-06-27 – 2017-07-03 (×7): 40 mg via ORAL
  Filled 2017-06-27 (×8): qty 2

## 2017-06-27 MED ORDER — MONTELUKAST SODIUM 10 MG PO TABS
10.0000 mg | ORAL_TABLET | Freq: Every day | ORAL | Status: DC
  Administered 2017-06-27: 10 mg via ORAL
  Filled 2017-06-27: qty 1

## 2017-06-27 MED ORDER — METHYLPREDNISOLONE SODIUM SUCC 125 MG IJ SOLR
125.0000 mg | Freq: Once | INTRAMUSCULAR | Status: AC
  Administered 2017-06-27: 125 mg via INTRAVENOUS

## 2017-06-27 MED ORDER — SODIUM CHLORIDE 0.9% FLUSH
3.0000 mL | Freq: Two times a day (BID) | INTRAVENOUS | Status: DC
  Administered 2017-06-27 – 2017-07-04 (×14): 3 mL via INTRAVENOUS

## 2017-06-27 MED ORDER — VANCOMYCIN HCL IN DEXTROSE 750-5 MG/150ML-% IV SOLN
750.0000 mg | INTRAVENOUS | Status: DC
  Filled 2017-06-27: qty 150

## 2017-06-27 MED ORDER — NITROGLYCERIN 2 % TD OINT
1.0000 [in_us] | TOPICAL_OINTMENT | Freq: Four times a day (QID) | TRANSDERMAL | Status: AC
  Administered 2017-06-27 – 2017-06-28 (×4): 1 [in_us] via TOPICAL
  Filled 2017-06-27 (×4): qty 1

## 2017-06-27 MED ORDER — BISOPROLOL FUMARATE 5 MG PO TABS
5.0000 mg | ORAL_TABLET | Freq: Every day | ORAL | Status: DC
  Administered 2017-06-27 – 2017-07-04 (×8): 5 mg via ORAL
  Filled 2017-06-27 (×8): qty 1

## 2017-06-27 MED ORDER — DEXTROSE 5 % IV SOLN
500.0000 mg | Freq: Once | INTRAVENOUS | Status: AC
  Administered 2017-06-27: 500 mg via INTRAVENOUS
  Filled 2017-06-27: qty 500

## 2017-06-27 MED ORDER — INSULIN GLARGINE 100 UNIT/ML ~~LOC~~ SOLN
20.0000 [IU] | Freq: Every day | SUBCUTANEOUS | Status: DC
  Administered 2017-06-27 – 2017-06-28 (×2): 20 [IU] via SUBCUTANEOUS
  Filled 2017-06-27 (×3): qty 0.2

## 2017-06-27 MED ORDER — ONDANSETRON HCL 4 MG/2ML IJ SOLN: 4.0000 mg | Freq: Four times a day (QID) | INTRAMUSCULAR | Status: DC | PRN

## 2017-06-27 MED ORDER — IPRATROPIUM-ALBUTEROL 0.5-2.5 (3) MG/3ML IN SOLN
3.0000 mL | Freq: Four times a day (QID) | RESPIRATORY_TRACT | Status: DC
  Administered 2017-06-27: 3 mL via RESPIRATORY_TRACT
  Filled 2017-06-27: qty 3

## 2017-06-27 NOTE — Care Management (Addendum)
Patient is currently being followed by Angela Pham and SW under [facility].  She has the COPD/Pneumonia .  She does not have the Lasix Protocol. She was to have had a sleep study at [facility] on 9/27.  CM has left voicemail agency to inquire about results.  Patient had venous blood gas with PCO2 of 90.  Her medicare pharmacy plan does not have benefits for Spiriva which was address on last admission.  She discharged on Incruse Ellipta with Advair which is suppose to have same therapeutic benefits . There is no insurance plan override for spiriva and patient apparently has used the allowable 2 hospital to home Spiriva referrals.  CM has discussed with patient on previous admission of need to reassess her pharmacy coverage plan during open medicare enrollment . Notified [facility] of readmission.  Patient qualified for nocturnal oxygen provided through [facility] during previous admission

## 2017-06-27 NOTE — Progress Notes (Signed)
[facility] [place] NAME: Angela Pham    MR#:  [id number]  DATE OF BIRTH:  Dec 18, 1940  SUBJECTIVE:   Patient admitted with increasing shortness of breath. Complains of chest tightness which works of breath. Requesting nebulizer treatment. Patient is off BiPAP. Currently on nasal cannula oxygen. Sats 100% REVIEW OF SYSTEMS:   Review of Systems  Constitutional: Negative for chills, fever and weight loss.  HENT: Negative for ear discharge, ear pain and nosebleeds.   Eyes: Negative for blurred vision, pain and discharge.  Respiratory: Positive for shortness of breath. Negative for sputum production, wheezing and stridor.   Cardiovascular: Negative for chest pain, palpitations, orthopnea and PND.  Gastrointestinal: Negative for abdominal pain, diarrhea, nausea and vomiting.  Genitourinary: Negative for frequency and urgency.  Musculoskeletal: Negative for back pain and joint pain.  Neurological: Positive for weakness. Negative for sensory change, speech change and focal weakness.  Psychiatric/Behavioral: Negative for depression and hallucinations. The patient is not nervous/anxious.    Tolerating Diet:yes Tolerating PT: pending  DRUG ALLERGIES:   Allergies  Allergen Reactions  . Ace Inhibitors Anaphylaxis    Tongue swelling  . Shellfish Allergy Hives and Swelling    Swelling around face and mouth     VITALS:  Blood pressure 107/72, pulse (!) 105, temperature 97.7 F (36.5 C), temperature source Axillary, resp. rate 20, height 4\' 11"  (1.499 m), weight 84 kg (185 lb 3 oz), SpO2 (!) 89 %.  PHYSICAL EXAMINATION:   Physical Exam  GENERAL:  76 y.o.-year-old patient lying in the bed with no acute distress. obese EYES: Pupils equal, round, reactive to light and accommodation. No scleral icterus. Extraocular muscles intact.  HEENT: Head atraumatic, normocephalic. Oropharynx and nasopharynx clear.  NECK:  Supple, no jugular venous  distention. No thyroid enlargement, no tenderness.  LUNGS: distant breath sounds bilaterally, no wheezing, rales, rhonchi. No use of accessory muscles of respiration.  CARDIOVASCULAR: S1, S2 normal. No murmurs, rubs, or gallops.  ABDOMEN: Soft, nontender, nondistended. Bowel sounds present. No organomegaly or mass.  EXTREMITIES: No cyanosis, clubbing or edema b/l.    NEUROLOGIC: Cranial nerves II through XII are intact. No focal Motor or sensory deficits b/l.   PSYCHIATRIC:  patient is alert and oriented x 3.  SKIN: No obvious rash, lesion, or ulcer.   LABORATORY PANEL:  CBC  Recent Labs Lab 06/27/17 0235  WBC 17.0*  HGB 10.8*  HCT 33.4*  PLT 296    Chemistries   Recent Labs Lab 06/27/17 0235  NA 144  K 4.8  CL 106  CO2 29  GLUCOSE 245*  BUN 38*  CREATININE 1.98*  CALCIUM 8.5*  AST 82*  ALT 57*  ALKPHOS 74  BILITOT 0.4   Cardiac Enzymes  Recent Labs Lab 06/27/17 0847  TROPONINI 0.07*   RADIOLOGY:  Dg Chest Portable 1 View  Result Date: 06/27/2017 CLINICAL DATA:  Shortness of breath. Decreased oxygen saturation. Labored breathing. EXAM: PORTABLE CHEST 1 VIEW COMPARISON:  06/14/2017 FINDINGS: Cardiac enlargement. No vascular congestion. Soft tissue attenuation over the lung bases. No definite evidence of any infiltration or consolidation. No blunting of costophrenic angles. No pneumothorax. IMPRESSION: Cardiac enlargement.  No evidence of active pulmonary disease. Electronically Signed   By: Lucienne Capers M.D.   On: 06/27/2017 02:52   ASSESSMENT AND PLAN:  77 y/o female with PMH of combined systolic and diastolic heart failure, CAD, hyperlipidemia, HTN, COPDon home O2 who was recently discharged on 9/20/2018after being treated for  respiratory failure and nosocomial pneumonia. Patient presented to the emergency room with complaints of SOB. When EMS arrived, her SPO2 was in the 50s hence she was placed on CPAP, given nebs   1.Acute on chronichypoxemic  respiratory failure multifactorial combination of COPD and combined diastolic systolic heart failure -treat as outlined below  2.Acute Diastolic and systolic heart failure exacerbation -we'll resume patient's Coreg and torsemide  3.Acute COPD exacerbation -IV steroids, inhalers, nebulizer  4. PresumedHealthcare associated pneumonia -empiric IV Zosyn -Blood cultures negative  5.History of type 2 diabetes -ssi -Start lantus 20 units sq daily -patient's metformin held secondary to elevated creatinine  6. Hypertension -now on Zebeta and hydralazine  Case discussed with Care Management/Social Worker. Management plans discussed with the patient, family and they are in agreement.  CODE STATUS:DNR DVT Prophylaxis:  lovenox  TOTAL TIME TAKING CARE OF THIS PATIENT: *30* minutes.  >50% time spent on counselling and coordination of care  POSSIBLE D/C IN *1-2* DAYS, DEPENDING ON CLINICAL CONDITION.  Note: This dictation was prepared with Dragon dictation along with smaller phrase technology. Any transcriptional errors that result from this process are unintentional.  Artha Chiasson M.D on 06/27/2017 at 2:17 PM  Between 7am to 6pm - Pager - [phone]  After 6pm go to [email or web address] - Proofreader  [facility]  Office  [phone]  CC: Primary care physician; Angela Pham, MDPatient ID: Angela Pham, female   DOB: 1941-08-24, 76 y.o.   MRN: [id number]

## 2017-06-27 NOTE — Progress Notes (Signed)
eLink Physician-Brief Progress Note Patient Name: Angela Pham DOB: 08/17/1941 MRN: [id number]   Date of Service  06/27/2017  HPI/Events of Note  39 F with PMH of combined systolic and diastolic heart failure, CAD, hyperlipidemia, HTN, COPD on home O2.  Recently d/c on 06/16/2017 after being treated for respiratory failure and nosocomial pneumonia. Patient presented to the emergency room with increased SOB and O2 sats of less than 70% on FA.  Placed on BiPAP.  Given lasix and admitted to SDU.  Patient is alert currently off BiPAP with sats of 100%, HR of 96, BP 93/55 (67) and RR of 22.  On camera check she is in NAD  eICU Interventions  Plan of care per primary admitting MD. Continue with diuresis as needed Steroids/nebs/BIPAP as needed Broad spec ABX for HCAP Continue to cycle trop since initial one 0.05     Intervention Category Evaluation Type: New Patient Evaluation  DETERDING,ELIZABETH 06/27/2017, 4:31 AM

## 2017-06-27 NOTE — Progress Notes (Signed)
Pharmacy Antibiotic Note  Angela Pham is a 76 y.o. female admitted on 06/27/2017 with pneumonia.  Pharmacy has been consulted for vanc/zosyn dosing.  Plan: Patient received vanc 1g IV and zosyn 3.375g IV x 1  Will f/u w/ vanc 750 mg IV q24h w/ 6 hour stack dose. Will draw vanc trough 10/04 @ 1100 prior to 4th dose. Will start on zosyn 3.375g IV q12h considering CrCl is close to < 20 ml/min w/ extended infusion dosing.  Ke 0.0238 T1/2 29 hrs ~ 24 hrs and decreasing dose to 750 mg for ease of administration. Goal trough 15 - 20 mcg/mL  Height: 4\' 11"  (149.9 cm) Weight: 185 lb 3 oz (84 kg) IBW/kg (Calculated) : 43.2  Temp (24hrs), Avg:97.9 F (36.6 C), Min:97.8 F (36.6 C), Max:98 F (36.7 C)   Recent Labs Lab 06/23/17 1350 06/27/17 0235  WBC  --  17.0*  CREATININE 1.86* 1.98*    Estimated Creatinine Clearance: 23.4 mL/min (A) (by C-G formula based on SCr of 1.98 mg/dL (H)).    Allergies  Allergen Reactions  . Ace Inhibitors Anaphylaxis    Tongue swelling  . Shellfish Allergy Hives and Swelling    Swelling around face and mouth     Thank you for allowing pharmacy to be a part of this patient's care.  Tobie Lords, PharmD, BCPS Clinical Pharmacist 06/27/2017

## 2017-06-27 NOTE — H&P (Signed)
[facility] [place] NAME: Angela Pham    MR#:  [id number]  DATE OF BIRTH:  1941/08/28  DATE OF ADMISSION:  06/27/2017  PRIMARY CARE PHYSICIAN: Cletis Athens, MD   REQUESTING/REFERRING PHYSICIAN:   CHIEF COMPLAINT:   Chief Complaint  Patient presents with  . Respiratory Distress    HISTORY OF PRESENT ILLNESS: Angela Pham  is a 76 y.o. female with a known history of combined systolic and diastolic heart failure, coronary artery disease, hyperlipidemia, hypertension, obesity, pulmonary hypertension, chronic respiratory failure on oxygen at home was recently discharged on 09/20/2018to home after being treated for respiratory failure and nosocomial pneumonia. Patient presented to the emergency room with increased shortness of breath and her O2 saturations were low less than 70% on room air She was put on BiPAP and stabilized in the emergency room. Has some occasional cough and still has swelling of the legs secondary to fluid retention.Patient has wheezes in both lung fields. She was evaluated in the emergency room with ABG which showed elevated CO2 level. No complaints of any chest pain. Hospitalist service was consulted for further care.  PAST MEDICAL HISTORY:   Past Medical History:  Diagnosis Date  . Asthma   . Chronic combined systolic and diastolic CHF (congestive heart failure) ([place])    a. TTE 02/2017: EF 45-50%, basal amd midanteroseptal HK b. 05/2017: echo showing EF of 35-40%, mild AI, moderate MR, and mildly dilated LA.   [place] Kitchen Coronary artery disease, non-occlusive    a. LHC 02/2017 normal coronary arteries  . Hyperlipidemia   . Hypertension   . Morbid obesity ([place])   . Pulmonary hypertension ([place])   . Type II diabetes mellitus ([place])     PAST SURGICAL HISTORY: Past Surgical History:  Procedure Laterality Date  . c-section    . RIGHT/LEFT HEART CATH AND CORONARY ANGIOGRAPHY N/A 03/09/2017   Procedure: Right/Left Heart Cath and  Coronary Angiography;  Surgeon: Minna Merritts, MD;  Location: [facility];  Service: Cardiovascular;  Laterality: N/A;    SOCIAL HISTORY:  Social History  Substance Use Topics  . Smoking status: Never Smoker  . Smokeless tobacco: Never Used  . Alcohol use No    FAMILY HISTORY:  Family History  Problem Relation Age of Onset  . CAD Father   . Colon cancer Sister   . Leukemia Brother   . Throat cancer Brother   . Cervical cancer Sister     DRUG ALLERGIES:  Allergies  Allergen Reactions  . Ace Inhibitors Anaphylaxis    Tongue swelling  . Shellfish Allergy Hives and Swelling    Swelling around face and mouth     REVIEW OF SYSTEMS:   CONSTITUTIONAL: No fever, has weakness.  EYES: No blurred or double vision.  EARS, NOSE, AND THROAT: No tinnitus or ear pain.  RESPIRATORY: Has occasional cough, shortness of breath, wheezing  No hemoptysis.  CARDIOVASCULAR: No chest pain, orthopnea, has edema.  GASTROINTESTINAL: No nausea, vomiting, diarrhea or abdominal pain.  GENITOURINARY: No dysuria, hematuria.  ENDOCRINE: No polyuria, nocturia,  HEMATOLOGY: No anemia, easy bruising or bleeding SKIN: No rash or lesion. MUSCULOSKELETAL: No joint pain or arthritis.   NEUROLOGIC: No tingling, numbness, weakness.  PSYCHIATRY: No anxiety or depression.   MEDICATIONS AT HOME:  Prior to Admission medications   Medication Sig Start Date End Date Taking? Authorizing Provider  aspirin EC 81 MG EC tablet Take 1 tablet (81 mg total) by mouth daily. 03/12/17  Yes [name], MD  atorvastatin (LIPITOR) 40 MG tablet Take 40 mg by mouth daily.   Yes [provider]  carvedilol (COREG) 6.25 MG tablet Take 1 tablet (6.25 mg total) by mouth 2 (two) times daily. 06/23/17  Yes Strader, [place] M, PA-C  Fluticasone-Salmeterol (ADVAIR DISKUS) 250-50 MCG/DOSE AEPB Inhale 1 puff into the lungs 2 (two) times daily. 06/16/17 06/16/18 Yes Epifanio Lesches, MD  hydrALAZINE (APRESOLINE)  25 MG tablet Take 1 tablet (25 mg total) by mouth every 8 (eight) hours. Patient taking differently: Take 25 mg by mouth 3 (three) times daily.  05/08/17  Yes Vaughan Basta, MD  Ipratropium-Albuterol (COMBIVENT IN) Inhale 3 puffs into the lungs every 4 (four) hours as needed.   Yes [provider]  ipratropium-albuterol (DUONEB) 0.5-2.5 (3) MG/3ML SOLN Inhale 3 mLs into the lungs every 4 (four) hours as needed.   Yes [provider]  levocetirizine (XYZAL) 5 MG tablet Take 10 mg by mouth daily. 03/18/17  Yes [provider]  metFORMIN (GLUCOPHAGE) 850 MG tablet Take 1 tablet by mouth 2 (two) times daily.   Yes [provider]  mirtazapine (REMERON) 45 MG tablet Take 1 tablet by mouth at bedtime.   Yes [provider]  montelukast (SINGULAIR) 10 MG tablet Take 1 tablet (10 mg total) by mouth at bedtime. 03/11/17  Yes Mody, Ulice Bold, MD  Multiple Vitamins-Minerals (EYE VITAMINS PO) Take 1 tablet by mouth 2 (two) times daily.    Yes [provider]  spironolactone (ALDACTONE) 25 MG tablet Take 0.5 tablets (12.5 mg total) by mouth daily. 06/23/17  Yes Strader, Fransisco Hertz, PA-C  torsemide (DEMADEX) 20 MG tablet Take one tablet daily. May take one extra tablet daily for weight gain >3lbs overnight or >5lbs in a week. 06/24/17  Yes Strader, [place] M, PA-C  umeclidinium bromide (INCRUSE ELLIPTA) 62.5 MCG/INH AEPB Inhale 1 puff into the lungs daily. 06/16/17  Yes Epifanio Lesches, MD      PHYSICAL EXAMINATION:   VITAL SIGNS: Blood pressure 104/83, pulse 90, temperature 97.8 F (36.6 C), temperature source Oral, resp. rate 20, height 4\' 11"  (1.499 m), weight 83.2 kg (183 lb 6.4 oz), SpO2 100 %.  GENERAL:  76 y.o.-year-old patient lying in the bed on bipap IPAP 20 EPAP 6 Rate 12 Fio2 100% EYES: Pupils equal, round, reactive to light and accommodation. No scleral icterus. Extraocular muscles intact.  HEENT: Head atraumatic, normocephalic.  Oropharynx and nasopharynx clear.  NECK:  Supple, no jugular venous distention. No thyroid enlargement, no tenderness.  LUNGS: Decreased breath sounds bilaterally, bilateral wheezing, rales. CARDIOVASCULAR: S1, S2 normal. No murmurs, rubs, or gallops.  ABDOMEN: Soft, nontender, nondistended. Bowel sounds present. No organomegaly or mass.  EXTREMITIES: Has pedal edema,  No cyanosis, or clubbing.  NEUROLOGIC: Cranial nerves II through XII are intact. Muscle strength 5/5 in all extremities. Sensation intact. Gait not checked.  PSYCHIATRIC: The patient is alert and oriented x 3.  SKIN: No obvious rash, lesion, or ulcer.   LABORATORY PANEL:   CBC  Recent Labs Lab 06/27/17 0235  WBC 17.0*  HGB 10.8*  HCT 33.4*  PLT 296  MCV 91.1  MCH 29.4  MCHC 32.3  RDW 17.1*  LYMPHSABS 4.8*  MONOABS 1.0*  EOSABS 0.4  BASOSABS 0.1   ------------------------------------------------------------------------------------------------------------------  Chemistries   Recent Labs Lab 06/23/17 1350 06/27/17 0235  NA 144 144  K 4.5 4.8  CL 99 106  CO2 27 29  GLUCOSE 100* 245*  BUN 37* 38*  CREATININE  1.86* 1.98*  CALCIUM 9.1 8.5*  AST  --  82*  ALT  --  57*  ALKPHOS  --  74  BILITOT  --  0.4   ------------------------------------------------------------------------------------------------------------------ estimated creatinine clearance is 23.3 mL/min (A) (by C-G formula based on SCr of 1.98 mg/dL (H)). ------------------------------------------------------------------------------------------------------------------ No results for input(s): TSH, T4TOTAL, T3FREE, THYROIDAB in the last 72 hours.  Invalid input(s): FREET3   Coagulation profile No results for input(s): INR, PROTIME in the last 168 hours. ------------------------------------------------------------------------------------------------------------------- No results for input(s): DDIMER in the last 72  hours. -------------------------------------------------------------------------------------------------------------------  Cardiac Enzymes  Recent Labs Lab 06/27/17 0235  TROPONINI 0.05*   ------------------------------------------------------------------------------------------------------------------ Invalid input(s): POCBNP  ---------------------------------------------------------------------------------------------------------------  Urinalysis    Component Value Date/Time   COLORURINE STRAW (A) 06/12/2017 2032   APPEARANCEUR CLEAR (A) 06/12/2017 2032   LABSPEC 1.012 06/12/2017 2032   PHURINE 5.0 06/12/2017 2032   GLUCOSEU NEGATIVE 06/12/2017 2032   HGBUR NEGATIVE 06/12/2017 2032   BILIRUBINUR NEGATIVE 06/12/2017 2032   [place] NEGATIVE 06/12/2017 2032   PROTEINUR 100 (A) 06/12/2017 2032   NITRITE NEGATIVE 06/12/2017 2032   LEUKOCYTESUR NEGATIVE 06/12/2017 2032     RADIOLOGY: Dg Chest Portable 1 View  Result Date: 06/27/2017 CLINICAL DATA:  Shortness of breath. Decreased oxygen saturation. Labored breathing. EXAM: PORTABLE CHEST 1 VIEW COMPARISON:  06/14/2017 FINDINGS: Cardiac enlargement. No vascular congestion. Soft tissue attenuation over the lung bases. No definite evidence of any infiltration or consolidation. No blunting of costophrenic angles. No pneumothorax. IMPRESSION: Cardiac enlargement.  No evidence of active pulmonary disease. Electronically Signed   By: Lucienne Capers M.D.   On: 06/27/2017 02:52    EKG: Orders placed or performed during the hospital encounter of 06/27/17  . ED EKG  . ED EKG  . EKG 12-Lead  . EKG 12-Lead    IMPRESSION AND PLAN: 76 year old female patient with history of chronic respiratory failure, combined systolic and diastolic heart failure,COPD, hypertension, hyperlipidemia, diabetes mellitus, pulmonary hypertension presented to the emergency room with increased shortness of breath and low oxygen saturation. Admitting  diagnosis 1. Acute hypercapnic respiratory failure 2.Hypoxia 3. Combine systolic and diastolic heart failure 4.Acute COPD exacerbation 5. Hypertension 6. Hyperlipidemia 7. Pulmonary hypertension Treatment plan Admit patient to stepdown unit IV Lasix for diuresis Continue BiPAP for respiratory failure Intensivist consultation for respiratory failure and management IV Solu-Medrol 60 MG every 6 hourly Start patient on IV vancomycin and IV Zosyn antibiotic, recently treated for nosocomial pneumonia Monitor electrolytes DVT prophylaxis subcutaneous heparin   All the records are reviewed and case discussed with ED provider. Management plans discussed with the patient, family and they are in agreement.  CODE STATUS:DNR Code Status History    Date Active Date Inactive Code Status Order ID Comments User Context   06/13/2017  4:25 AM 06/16/2017  6:16 PM DNR [id number]  Mikael Spray, NP Inpatient   06/12/2017  3:28 AM 06/13/2017  4:25 AM Full Code [id number]  [place], [place], DO Inpatient   06/12/2017  2:00 AM 06/12/2017  3:28 AM DNR [id number]  Harvie Bridge, DO ED   05/29/2017  2:49 AM 06/02/2017  9:23 PM DNR [id number]  Verlin Dike, RN Inpatient   05/28/2017 10:59 PM 05/29/2017  2:49 AM Full Code [id number]  Hugelmeyer, Ubaldo Glassing, DO Inpatient   05/04/2017  5:19 PM 05/08/2017  5:18 PM DNR [id number]  Loletha Grayer, MD ED   03/09/2017 12:34 PM 03/11/2017 10:08 PM Full Code [id number]  Minna Merritts, MD Inpatient   03/07/2017  5:12 AM 03/09/2017 12:34  PM Full Code [id number]  Saundra Shelling, MD Inpatient    Questions for Most Recent Historical Code Status (Order [id number])    Question Answer Comment   In the event of cardiac or respiratory ARREST Do not call a "code blue"    In the event of cardiac or respiratory ARREST Do not perform Intubation, CPR, defibrillation or ACLS    In the event of cardiac or respiratory ARREST Use medication by any route, position, wound care, and other measures  to relive pain and suffering. May use oxygen, suction and manual treatment of airway obstruction as needed for comfort.    Comments DNR/DNI         Advance Directive Documentation     Most Recent Value  Type of Advance Directive  Healthcare Power of Attorney, Living will  Pre-existing out of facility DNR order (yellow form or pink MOST form)  -  "MOST" Form in Place?  -       TOTAL CRITICAL CARE TIME TAKING CARE OF THIS PATIENT: 54 minutes.    Saundra Shelling M.D on 06/27/2017 at 4:04 AM  Between 7am to 6pm - Pager - [phone]  After 6pm go to [email or web address] - password [email or web address]  Office  [phone]  CC: Primary care physician; Cletis Athens, MD

## 2017-06-27 NOTE — Progress Notes (Signed)
Patient removed from [place] and attempted 4L [place]. Patient attempted to use bedside commode on the 4L. After transferring back into bed patient stated she needed to go back on Bipap. Patient was wheezing and short of breath at the time.  Back on Bipap at this time.

## 2017-06-27 NOTE — Progress Notes (Signed)
Pt arrived to ED room [facility] with ems CPAP.  Pt was placed on BIPAP and settings were titrated to improve Pt's tidal volumes to provide adequate ventilatory support.  VBG sample was taken and also tested per MD.

## 2017-06-27 NOTE — ED Notes (Signed)
Pt asked if she was cool enough. Pt nodds. Pt more alert, eyes remaining open, nodding appropriately to questions.

## 2017-06-27 NOTE — ED Notes (Signed)
MD made aware of blood gas levels.

## 2017-06-27 NOTE — Progress Notes (Signed)
Pharmacy Antibiotic Note  Angela Pham is a 76 y.o. female admitted on 06/27/2017 with pneumonia.  Pharmacy has been consulted for vanc/zosyn dosing.   MRSA PCR negative 9/16  Plan: After discussion with Dr. Mortimer Fries, will d/c vancomycin.   Will increase Zosyn dosing to 3.375 g EI q 8 hours.   Height: 4\' 11"  (149.9 cm) Weight: 185 lb 3 oz (84 kg) IBW/kg (Calculated) : 43.2  Temp (24hrs), Avg:97.8 F (36.6 C), Min:97.7 F (36.5 C), Max:98 F (36.7 C)   Recent Labs Lab 06/23/17 1350 06/27/17 0235 06/27/17 0615  WBC  --  17.0*  --   CREATININE 1.86* 1.98*  --   LATICACIDVEN  --   --  1.4    Estimated Creatinine Clearance: 23.4 mL/min (A) (by C-G formula based on SCr of 1.98 mg/dL (H)).    Allergies  Allergen Reactions  . Ace Inhibitors Anaphylaxis    Tongue swelling  . Shellfish Allergy Hives and Swelling    Swelling around face and mouth    Antimicrobials: Azithromycin 10/1 x 1 Vancomycin 10/1 x 1 Zosyn 10/1 >>  Micro: 9/16 MRSA PCR: negative  BCx: pending  UCx: pending  SCx: pending  Thank you for allowing pharmacy to be a part of this patient's care.  Ulice Dash, PharmD Clinical Pharmacist  06/27/2017

## 2017-06-27 NOTE — ED Triage Notes (Signed)
Pt arrived via ems from home with complaints of respiratory distress. Upon ems arrival pt's room air saturation in the low 50s. Pt placed on cpap in route with an increase in o2 to 90%. Upon arrival pt alert but breathing remains labored. MD and RT at bedside.

## 2017-06-27 NOTE — Consult Note (Signed)
Cardiology Consultation Note  Patient ID: Angela Pham, MRN: [id number], DOB/AGE: 1941/05/16 76 y.o. Admit date: 06/27/2017   Date of Consult: 06/27/2017 Primary Physician: Cletis Athens, MD Primary Cardiologist: Dr. Fletcher Anon, MD Requesting Physician: Dorene Sorrow, NP  Chief Complaint: SOB Reason for Consult: Possible acute on chronic combined CHF  HPI: Angela Pham is a 76 y.o. female who is being seen today for the evaluation of possible acute on chronic combined CHF at the request of Dorene Sorrow, NP. Patient has a h/o chronic combined CHF, pulmonary hypertension, pulmonary edema, reported asthma since childhood with frequent nebulizer usage, HTN, DM2 noncompliance, and HLD who presented to [facility] with increased SOB.   Patient admitted to [facility] in June 2018, for SOB, after relocating to [facility] ~ 1 year prior to that. She underwent TTE on 03/08/2017 that showed EF of 45-50%, basal-anteroseptal HK, poorly visulaized aortic valve that was possibly bicuspid, mild AI, moderate MR directed posteriorly, mildly dilated left atrium, RV systolic function was normal, PASP moderately increased at 55-60 mmHg. She was diuresed and underwent R/LHC that showed normal coronary arteries with LVDEP 38-43, RA mean 22, RV 53/18/22, unable to advance the catheter to wedge pressure. She cancelled both her hospital follow up appointments with cardiology and CHF clinic. She was admitted again in early August for same symptoms. Treated with IV steroids, ABX for possible PNA, and diuresed. She was admitted for a 3rd time in early September for same symptoms, treated as AECOPD. Most recently, she was admitted in late September from 9/15-9/20 for HCAP, AECOPD, and acute on chronic CHF with pulmonary hypertension. Echo showed EF 35-40%, mild AI, moderate MR, mildly dilated left atrium. She was diuresed with IV Lasix and weight at time of discharge was 191 pounds. She was seen in the office on 9/27 and was doing well. Her weight had  trended down to 180 pounds. Bmet at that time showed worsening renal function from 1.38-->1.86. She was advised to reduce torsemide to 20 mg daily from 40 mg daily and take an additional tab for weight gain/edema. Patient made this change. This was followed by onset of worsening SOB on 9/29 and worsening lower extremity edema on 9/30. She has continued to watch her salt intake. She has not been orthopneic. No chest pain. Weight has been stable at home as above. Because of her worsening SOB she presented to [facility] ED on 10/1.   Upon the patient's arrival to [facility] they were found to have stable BP, HR 120s bpm, tachypneic temp 97.8, oxygen saturation 80% on room air requiring BiPAP, weight 183 pounds. EKG showed sinus tachycardia, 128 bpm, nonspecific IVCD, CXR showed cardiomegaly without vascular congestion, effusion, or pulmonary edema. Labs showed troponin 0.05-->0.07, lactic acid 1.2, PCT 0.47, BNP 272 (improved from prior 2 weeks ago at 303 and 4 weeks ago at 701), SCr 1.98 (baseline 1.4-1.5), K+ 4.8, albumin 2.9, AST 82, ALT 57, WBC 17,000, HGB 10.8, PLT 296, VBG with pCO2 of 90, blood culture and sputum culture pending. Cardiology was asked to evaluate the patient's SOB.   Patient currently on nasal cannula and laying nearly supine in bed without issues. No chest pain.   Past Medical History:  Diagnosis Date  . Asthma   . Chronic combined systolic and diastolic CHF (congestive heart failure) ([place])    a. TTE 02/2017: EF 45-50%, basal amd midanteroseptal HK b. 05/2017: echo showing EF of 35-40%, mild AI, moderate MR, and mildly dilated LA.   Marland Kitchen Coronary artery disease, non-occlusive  a. LHC 02/2017 normal coronary arteries  . Hyperlipidemia   . Hypertension   . Morbid obesity ([place])   . Pulmonary hypertension ([place])   . Type II diabetes mellitus ([place])       Most Recent Cardiac Studies: [facility] 02/2017: Coronary angiography:  Coronary dominance: Right  Left mainstem: Large vessel that bifurcates  into the LAD and left circumflex, no significant disease noted  Left anterior descending (LAD): Large vessel that extends to the apical region, diagonal branch 2 of moderate size, no significant disease noted  Left circumflex (LCx): Large vessel with OM branch 2, no significant disease noted  Right coronary artery (RCA): Right dominant vessel with PL and PDA, no significant disease noted  Left ventriculography: Not performed as she had recent echo and has underlying CRI  Final Conclusions:  Normal coronary arteries  Right heart pressures: RA: mean 22 RV: 53/18/22 RVEDP: 22 LVEDP 38-43 Unable to advance catheter to wedge pressure despite multiple attempts   Recommendations:  Normal coronary arteries Wall motion secondary to LBBB Markedly elevated LVEDP Elevated right atrial pressures Numbers consistent with chronic diastolic CHF Blood pressure markedly elevated , tachycardic Will D/c amlodipine, start verapamil to assist with rate control  TTE 02/2017: Study Conclusions  - Left ventricle: The cavity size was at the upper limits of normal. Wall thickness was increased in a pattern of mild LVH. Systolic function was mildly reduced. The estimated ejection fraction was in the range of 45% to 50%. There is hypokinesis of the basal-midanteroseptal myocardium. - Aortic valve: Poorly visualized. A bicuspid morphology cannot be excluded. There was mild regurgitation. - Mitral valve: Calcified annulus. Mildly thickened leaflets . Leaflet separation was mildly reduced. There was moderate regurgitation directed posteriorly. - Left atrium: The atrium was mildly dilated. - Right ventricle: The cavity size was normal. Wall thickness was normal. Systolic function was normal. - Pulmonary arteries: Systolic pressure was moderately increased, in the range of 55 mm Hg to 60 mm Hg. - Pericardium, extracardiac: A trivial pericardial effusion was identified posterior  to the heart.  TTE with bubble study 05/2017: Study Conclusions  - Left ventricle: Systolic function was moderately reduced. The estimated ejection fraction was in the range of 35% to 40%. - Aortic valve: There was mild regurgitation. - Mitral valve: There was moderate regurgitation. - Left atrium: The atrium was mildly dilated. - Atrial septum: Echo contrast study showed no right-to-left atrial level shunt, at baseline or with provocation.   Surgical History:  Past Surgical History:  Procedure Laterality Date  . c-section    . RIGHT/LEFT HEART CATH AND CORONARY ANGIOGRAPHY N/A 03/09/2017   Procedure: Right/Left Heart Cath and Coronary Angiography;  Surgeon: Minna Merritts, MD;  Location: [facility];  Service: Cardiovascular;  Laterality: N/A;     Home Meds: Prior to Admission medications   Medication Sig Start Date End Date Taking? Authorizing Provider  aspirin EC 81 MG EC tablet Take 1 tablet (81 mg total) by mouth daily. 03/12/17  Yes Mody, Ulice Bold, MD  atorvastatin (LIPITOR) 40 MG tablet Take 40 mg by mouth daily.   Yes [provider]  carvedilol (COREG) 6.25 MG tablet Take 1 tablet (6.25 mg total) by mouth 2 (two) times daily. 06/23/17  Yes Strader, [place] M, PA-C  Fluticasone-Salmeterol (ADVAIR DISKUS) 250-50 MCG/DOSE AEPB Inhale 1 puff into the lungs 2 (two) times daily. 06/16/17 06/16/18 Yes Epifanio Lesches, MD  hydrALAZINE (APRESOLINE) 25 MG tablet Take 1 tablet (25 mg total) by mouth every 8 (eight)  hours. Patient taking differently: Take 25 mg by mouth 3 (three) times daily.  05/08/17  Yes Vaughan Basta, MD  Ipratropium-Albuterol (COMBIVENT IN) Inhale 3 puffs into the lungs every 4 (four) hours as needed.   Yes [provider]  ipratropium-albuterol (DUONEB) 0.5-2.5 (3) MG/3ML SOLN Inhale 3 mLs into the lungs every 4 (four) hours as needed.   Yes [provider]  levocetirizine (XYZAL) 5 MG tablet Take 10 mg by mouth  daily. 03/18/17  Yes [provider]  metFORMIN (GLUCOPHAGE) 850 MG tablet Take 1 tablet by mouth 2 (two) times daily.   Yes [provider]  mirtazapine (REMERON) 45 MG tablet Take 1 tablet by mouth at bedtime.   Yes [provider]  montelukast (SINGULAIR) 10 MG tablet Take 1 tablet (10 mg total) by mouth at bedtime. 03/11/17  Yes Mody, Ulice Bold, MD  Multiple Vitamins-Minerals (EYE VITAMINS PO) Take 1 tablet by mouth 2 (two) times daily.    Yes [provider]  spironolactone (ALDACTONE) 25 MG tablet Take 0.5 tablets (12.5 mg total) by mouth daily. 06/23/17  Yes Strader, Fransisco Hertz, PA-C  torsemide (DEMADEX) 20 MG tablet Take one tablet daily. May take one extra tablet daily for weight gain >3lbs overnight or >5lbs in a week. 06/24/17  Yes Strader, [place] M, PA-C  umeclidinium bromide (INCRUSE ELLIPTA) 62.5 MCG/INH AEPB Inhale 1 puff into the lungs daily. 06/16/17  Yes Epifanio Lesches, MD    Inpatient Medications:  . aspirin EC  81 mg Oral Daily  . atorvastatin  40 mg Oral Daily  . budesonide (PULMICORT) nebulizer solution  0.25 mg Nebulization Q12H  . carvedilol  6.25 mg Oral BID  . chlorhexidine  15 mL Mouth Rinse BID  . furosemide  40 mg Intravenous Q12H  . heparin  5,000 Units Subcutaneous Q8H  . hydrALAZINE  25 mg Oral Q12H  . ipratropium-albuterol  3 mL Nebulization Q6H  . mouth rinse  15 mL Mouth Rinse q12n4p  . methylPREDNISolone (SOLU-MEDROL) injection  60 mg Intravenous Q6H  . mirtazapine  45 mg Oral QHS  . montelukast  10 mg Oral QHS  . nitroGLYCERIN  1 inch Topical Q6H  . sodium chloride flush  3 mL Intravenous Q12H  . spironolactone  12.5 mg Oral Daily   . sodium chloride    . piperacillin-tazobactam (ZOSYN)  IV Stopped (06/27/17 0903)  . vancomycin      Allergies:  Allergies  Allergen Reactions  . Ace Inhibitors Anaphylaxis    Tongue swelling  . Shellfish Allergy Hives and Swelling    Swelling around face and mouth      Social History   Social History  . Marital status: Widowed    Spouse name: N/A  . Number of children: N/A  . Years of education: N/A   Occupational History  . Not on file.   Social History Main Topics  . Smoking status: Never Smoker  . Smokeless tobacco: Never Used  . Alcohol use No  . Drug use: No  . Sexual activity: No   Other Topics Concern  . Not on file   Social History Narrative  . No narrative on file     Family History  Problem Relation Age of Onset  . CAD Father   . Colon cancer Sister   . Leukemia Brother   . Throat cancer Brother   . Cervical cancer Sister      Review of Systems: Review of Systems  Constitutional: Positive for malaise/fatigue. Negative for chills,  diaphoresis, fever and weight loss.  HENT: Negative for congestion.   Eyes: Negative for discharge and redness.  Respiratory: Positive for cough and shortness of breath. Negative for hemoptysis, sputum production and wheezing.   Cardiovascular: Positive for leg swelling. Negative for chest pain, palpitations, orthopnea, claudication and PND.  Gastrointestinal: Negative for abdominal pain, blood in stool, heartburn, melena, nausea and vomiting.  Genitourinary: Negative for hematuria.  Musculoskeletal: Negative for falls and myalgias.  Skin: Negative for rash.  Neurological: Positive for weakness. Negative for dizziness, tingling, tremors, sensory change, speech change, focal weakness and loss of consciousness.  Endo/Heme/Allergies: Does not bruise/bleed easily.  Psychiatric/Behavioral: Negative for substance abuse. The patient is not nervous/anxious.   All other systems reviewed and are negative.   Labs:  Recent Labs  06/27/17 0235  TROPONINI 0.05*   Lab Results  Component Value Date   WBC 17.0 (H) 06/27/2017   HGB 10.8 (L) 06/27/2017   HCT 33.4 (L) 06/27/2017   MCV 91.1 06/27/2017   PLT 296 06/27/2017    Recent Labs Lab 06/27/17 0235  NA 144  K 4.8  CL 106  CO2 29   BUN 38*  CREATININE 1.98*  CALCIUM 8.5*  PROT 6.4*  BILITOT 0.4  ALKPHOS 74  ALT 57*  AST 82*  GLUCOSE 245*   No results found for: CHOL, HDL, LDLCALC, TRIG No results found for: DDIMER  Radiology/Studies:  Dg Chest Portable 1 View  Result Date: 06/27/2017 IMPRESSION: Cardiac enlargement.  No evidence of active pulmonary disease. Electronically Signed   By: Lucienne Capers M.D.   On: 06/27/2017 02:52    EKG: Interpreted by me showed: sinus tachycardia, 128 bpm, nonspecific IVCD Telemetry: Interpreted by me showed: NSR  Weights: Filed Weights   06/27/17 0237 06/27/17 0500  Weight: 183 lb 6.4 oz (83.2 kg) 185 lb 3 oz (84 kg)     Physical Exam: Blood pressure 121/62, pulse 97, temperature 97.7 F (36.5 C), temperature source Axillary, resp. rate 20, height 4\' 11"  (1.499 m), weight 185 lb 3 oz (84 kg), SpO2 92 %. Body mass index is 37.4 kg/m. General: Well developed, well nourished, in no acute distress. Head: Normocephalic, atraumatic, sclera non-icteric, no xanthomas, nares are without discharge.  Neck: Negative for carotid bruits. JVD difficult to assess 2/2 body habitus. Lungs: Diminished breath sounds bilaterally. Breathing is unlabored. Heart: RRR with S1 S2. No murmurs, rubs, or gallops appreciated. Abdomen: Soft, non-tender, non-distended with normoactive bowel sounds. No hepatomegaly. No rebound/guarding. No obvious abdominal masses. Msk:  Strength and tone appear normal for age. Extremities: No clubbing or cyanosis. Trace pre-tibial edema. Distal pedal pulses are 2+ and equal bilaterally. Neuro: Alert and oriented X 3. No facial asymmetry. No focal deficit. Moves all extremities spontaneously. Psych:  Responds to questions appropriately with a normal affect.    Assessment and Plan:  Active Problems:   Respiratory failure ([place])    1. Acute hypercapnic and hypoxic respiratory failure: -Likely multifactorial including AECOPD, HCAP, acute on chronic combined  CHF, and pulmonary hypertension -Wean oxygen as able per primary -Supportive care  2. Acute on chronic combined CHF/pulmonary HTN/NICM: -Suspect her symptoms are more pulmonary in etiology, though cannot rule out a small component of CHF exacerbation  -Weight is down 8 pounds from her recent discharge weight and renal function is trending up from baseline -Continue Coreg, IV Lasix -Hold spironolactone given AKI on top of CKD -Not on ACEi/ARB given prior history of angioedema  -Add Imdur to hydralazine prior to discharge  -  Daily weights/strict Is and Os  3. Acute on CKD stage III: -Gentle hydration per primary -Hold spironolactone as above -Monitor  4. AECOPD/HCAP/leukocytosis: -Per IM -Wean steroids as able  5. Elevated troponin: -Minimally elevated and flat trending -Likely supply demand ischemia in the setting of her AECOPD/HCAP/CHF/CKD -Recent nonobstructive cath as above -No plans for further ischemic evaluation at this time  6. HTN: -Improved  7. HLD: -Lipitor   Signed, Christell Faith, PA-C [place] Pager: [phone] 06/27/2017, 9:25 AM

## 2017-06-27 NOTE — ED Provider Notes (Signed)
[facility] Emergency Department Provider Note   ____________________________________________   First MD Initiated Contact with Patient 06/27/17 [phone]     (approximate)  I have reviewed the triage vital signs and the nursing notes.   HISTORY  Chief Complaint Respiratory Distress  History is limited due to patient's severe respiratory distress.  HPI Angela Pham is a 76 y.o. female who comes into the hospital today with shortness of breath.the patient does have a history of asthma. According to EMS her oxygen saturations were 50% on arrival. The patient states that she's been having some cough with productive green sputum. She is very somnolent on arrival and in some severe respiratory distress. She was brought in on CPAP by EMS. They state that they were able to give her 3 duo nebs but she had not received any other treatment.   Past Medical History:  Diagnosis Date  . Asthma   . Chronic combined systolic and diastolic CHF (congestive heart failure) ([place])    a. TTE 02/2017: EF 45-50%, basal amd midanteroseptal HK b. 05/2017: echo showing EF of 35-40%, mild AI, moderate MR, and mildly dilated LA.   Marland Kitchen Coronary artery disease, non-occlusive    a. LHC 02/2017 normal coronary arteries  . Hyperlipidemia   . Hypertension   . Morbid obesity ([place])   . Pulmonary hypertension ([place])   . Type II diabetes mellitus [facility])     Patient Active Problem List   Diagnosis Date Noted  . Chronic diastolic heart failure ([place]) 06/24/2017  . Asthma 06/24/2017  . Diabetes ([place]) 06/24/2017  . Essential hypertension 06/23/2017  . Hyperlipidemia, unspecified 06/23/2017  . CKD (chronic kidney disease) stage 3, GFR 30-59 ml/min (HCC) 06/23/2017  . Pressure injury of skin 06/12/2017  . COPD exacerbation ([place]) 05/28/2017  . SOB (shortness of breath)   . Chronic combined systolic and diastolic heart failure ([place]) 03/07/2017    Past Surgical History:  Procedure Laterality Date  .  c-section    . RIGHT/LEFT HEART CATH AND CORONARY ANGIOGRAPHY N/A 03/09/2017   Procedure: Right/Left Heart Cath and Coronary Angiography;  Surgeon: Minna Merritts, MD;  Location: [facility];  Service: Cardiovascular;  Laterality: N/A;    Prior to Admission medications   Medication Sig Start Date End Date Taking? Authorizing Provider  aspirin EC 81 MG EC tablet Take 1 tablet (81 mg total) by mouth daily. 03/12/17  Yes Mody, Ulice Bold, MD  atorvastatin (LIPITOR) 40 MG tablet Take 40 mg by mouth daily.   Yes [provider]  carvedilol (COREG) 6.25 MG tablet Take 1 tablet (6.25 mg total) by mouth 2 (two) times daily. 06/23/17  Yes Strader, [place] M, PA-C  Fluticasone-Salmeterol (ADVAIR DISKUS) 250-50 MCG/DOSE AEPB Inhale 1 puff into the lungs 2 (two) times daily. 06/16/17 06/16/18 Yes Epifanio Lesches, MD  hydrALAZINE (APRESOLINE) 25 MG tablet Take 1 tablet (25 mg total) by mouth every 8 (eight) hours. Patient taking differently: Take 25 mg by mouth 3 (three) times daily.  05/08/17  Yes Vaughan Basta, MD  Ipratropium-Albuterol (COMBIVENT IN) Inhale 3 puffs into the lungs every 4 (four) hours as needed.   Yes [provider]  ipratropium-albuterol (DUONEB) 0.5-2.5 (3) MG/3ML SOLN Inhale 3 mLs into the lungs every 4 (four) hours as needed.   Yes [provider]  levocetirizine (XYZAL) 5 MG tablet Take 10 mg by mouth daily. 03/18/17  Yes [provider]  metFORMIN (GLUCOPHAGE) 850 MG tablet Take 1 tablet by mouth 2 (two) times daily.  Yes [provider]  mirtazapine (REMERON) 45 MG tablet Take 1 tablet by mouth at bedtime.   Yes [provider]  montelukast (SINGULAIR) 10 MG tablet Take 1 tablet (10 mg total) by mouth at bedtime. 03/11/17  Yes Mody, Ulice Bold, MD  Multiple Vitamins-Minerals (EYE VITAMINS PO) Take 1 tablet by mouth 2 (two) times daily.    Yes [provider]  spironolactone (ALDACTONE) 25 MG tablet Take 0.5  tablets (12.5 mg total) by mouth daily. 06/23/17  Yes Strader, Fransisco Hertz, PA-C  torsemide (DEMADEX) 20 MG tablet Take one tablet daily. May take one extra tablet daily for weight gain >3lbs overnight or >5lbs in a week. 06/24/17  Yes Strader, [place] M, PA-C  umeclidinium bromide (INCRUSE ELLIPTA) 62.5 MCG/INH AEPB Inhale 1 puff into the lungs daily. 06/16/17  Yes Epifanio Lesches, MD    Allergies Ace inhibitors and Shellfish allergy  Family History  Problem Relation Age of Onset  . CAD Father   . Colon cancer Sister   . Leukemia Brother   . Throat cancer Brother   . Cervical cancer Sister     Social History Social History  Substance Use Topics  . Smoking status: Never Smoker  . Smokeless tobacco: Never Used  . Alcohol use No    Review of Systems  Constitutional: No fever/chills Eyes: No visual changes. ENT: No sore throat. Cardiovascular: Denies chest pain. Respiratory: cough and shortness of breath. Gastrointestinal: No abdominal pain.  No nausea, no vomiting.  No diarrhea.  No constipation. Genitourinary: Negative for dysuria. Musculoskeletal: Negative for back pain. Skin: Negative for rash. Neurological: Negative for headaches, focal weakness or numbness.   ____________________________________________   PHYSICAL EXAM:  VITAL SIGNS: ED Triage Vitals  Enc Vitals Group     BP 06/27/17 0237 (!) 161/90     Pulse Rate 06/27/17 0235 (!) 128     Resp 06/27/17 0235 (!) 39     Temp 06/27/17 0235 97.8 F (36.6 C)     Temp Source 06/27/17 0235 Oral     SpO2 06/27/17 0230 (!) 80 %     Weight 06/27/17 0237 183 lb 6.4 oz (83.2 kg)     Height 06/27/17 0237 4\' 11"  (1.499 m)     Head Circumference --      Peak Flow --      Pain Score --      Pain Loc --      Pain Edu? --      Excl. in [place]? --     Constitutional: somnolent but oriented. patient in some acute respiratory distress Eyes: Conjunctivae are normal. PERRL. EOMI. Head: Atraumatic. Nose: No  congestion/rhinnorhea. Mouth/Throat: Mucous membranes are moist.  Oropharynx non-erythematous. Cardiovascular: echocardiogram, regular rhythm. Grossly normal heart sounds.  Good peripheral circulation. Respiratory: increased respiratory effort.  retractions. expiratory wheezes in all lung fields Gastrointestinal: Soft and nontender. No distention. positive bowel sounds Musculoskeletal: No lower extremity tenderness nor edema.   Neurologic:  Normal speech and language.  Skin:  Skin is warm, dry and intact.  Psychiatric: Mood and affect are normal.   ____________________________________________   LABS (all labs ordered are listed, but only abnormal results are displayed)  Labs Reviewed  BLOOD GAS, VENOUS - Abnormal; Notable for the following:       Result Value   pH, Ven 7.15 (*)    pCO2, Ven 90 (*)    Bicarbonate 31.4 (*)    All other components within normal limits  TROPONIN I - Abnormal; Notable for  the following:    Troponin I 0.05 (*)    All other components within normal limits  CBC WITH DIFFERENTIAL/PLATELET - Abnormal; Notable for the following:    WBC 17.0 (*)    RBC 3.66 (*)    Hemoglobin 10.8 (*)    HCT 33.4 (*)    RDW 17.1 (*)    Neutro Abs 10.6 (*)    Lymphs Abs 4.8 (*)    Monocytes Absolute 1.0 (*)    All other components within normal limits  COMPREHENSIVE METABOLIC PANEL - Abnormal; Notable for the following:    Glucose, Bld 245 (*)    BUN 38 (*)    Creatinine, Ser 1.98 (*)    Calcium 8.5 (*)    Total Protein 6.4 (*)    Albumin 2.9 (*)    AST 82 (*)    ALT 57 (*)    GFR calc non Af Amer 24 (*)    GFR calc Af Amer 27 (*)    All other components within normal limits  BRAIN NATRIURETIC PEPTIDE - Abnormal; Notable for the following:    B Natriuretic Peptide 272.0 (*)    All other components within normal limits   ____________________________________________  EKG  ED ECG REPORT I, Loney Hering, the attending physician, personally viewed and  interpreted this ECG.   Date: 06/27/2017  EKG Time: 234  Rate: 128  Rhythm: sinus tachycardia  Axis: left axis deviation  Intervals:none  ST&T Change: none  ____________________________________________  RADIOLOGY  Dg Chest Portable 1 View  Result Date: 06/27/2017 CLINICAL DATA:  Shortness of breath. Decreased oxygen saturation. Labored breathing. EXAM: PORTABLE CHEST 1 VIEW COMPARISON:  06/14/2017 FINDINGS: Cardiac enlargement. No vascular congestion. Soft tissue attenuation over the lung bases. No definite evidence of any infiltration or consolidation. No blunting of costophrenic angles. No pneumothorax. IMPRESSION: Cardiac enlargement.  No evidence of active pulmonary disease. Electronically Signed   By: Lucienne Capers M.D.   On: 06/27/2017 02:52    ____________________________________________   PROCEDURES  Procedure(s) performed: None  Procedures  Critical Care performed: Yes, see critical care note(s)   CRITICAL CARE Performed by: Charlesetta Ivory P   Total critical care time: 30 minutes  Critical care time was exclusive of separately billable procedures and treating other patients.  Critical care was necessary to treat or prevent imminent or life-threatening deterioration.  Critical care was time spent personally by me on the following activities: development of treatment plan with patient and/or surrogate as well as nursing, discussions with consultants, evaluation of patient's response to treatment, examination of patient, obtaining history from patient or surrogate, ordering and performing treatments and interventions, ordering and review of laboratory studies, ordering and review of radiographic studies, pulse oximetry and re-evaluation of patient's condition.   ____________________________________________   INITIAL IMPRESSION / ASSESSMENT AND PLAN / ED COURSE  Pertinent labs & imaging results that were available during my care of the patient were reviewed  by me and considered in my medical decision making (see chart for details).  This is a 76 year old female who comes into the hospital today with acute respiratory distress.  My differential diagnosis includes asthma exacerbation, COPD exacerbation, CHF exacerbation.    I did review the notes and it appears that I did admit this patient to the intensive care unit approximately 2 weeks ago. I did receive some results which showed a PCO2 of 90 with a pH of 7.15 which makes me concerned for acute COPD exacerbation. The patient's white blood  cell count of 17,000but her chest x-ray is unremarkable. I gave the patient some magnesium sulfate as well as some Solu-Medrol. She also received 2 more albuterol nebs and she states that her breathing is improving. I did contact the hospitalist to admit the patient to the intensive care unit. The patient has no further concerns at this time. The patient will be admitted. the patient was placed on BiPAP when she arrived to the emergency department as well. With the BiPAP and the medications her symptoms are actively improving.      ____________________________________________   FINAL CLINICAL IMPRESSION(S) / ED DIAGNOSES  Final diagnoses:  COPD exacerbation ([place])  Acute respiratory failure with hypoxia and hypercapnia (HCC)      NEW MEDICATIONS STARTED DURING THIS VISIT:  New Prescriptions   No medications on file     Note:  This document was prepared using Dragon voice recognition software and may include unintentional dictation errors.    Loney Hering, MD 06/27/17 [phone]

## 2017-06-27 NOTE — Consult Note (Signed)
PULMONARY / CRITICAL CARE MEDICINE   Name: Angela Pham MRN: [id number] DOB: 1941-01-16    ADMISSION DATE:  06/27/2017   CONSULTATION DATE:  06/27/2017  REFERRING MD:  Dr. Estanislado Pandy  REASON: Acute respiratory failure  CHIEF COMPLAINT:  dyspnea  HISTORY OF PRESENT ILLNESS:   This is a 76 y/o female with PMH of combined systolic and diastolic heart failure, CAD, hyperlipidemia, HTN, COPD on home O2 who was recently discharged on 06/16/2017 after being treated for respiratory failure and nosocomial pneumonia. Patient presented to the emergency room with complaints of SOB. When EMS arrived, her SPO2 was in the 50s hence she was placed on CPAP, given nebs and transferred to the ED. At the ED, her blood gas showed hypercapnea. She is being admitted to the SDU for acute respiratory failure.  PAST MEDICAL HISTORY :  She  has a past medical history of Asthma; Chronic combined systolic and diastolic CHF (congestive heart failure) ([place]); Coronary artery disease, non-occlusive; Hyperlipidemia; Hypertension; Morbid obesity ([place]); Pulmonary hypertension ([place]); and Type II diabetes mellitus ([place]).  PAST SURGICAL HISTORY: She  has a past surgical history that includes c-section and RIGHT/LEFT HEART CATH AND CORONARY ANGIOGRAPHY (N/A, 03/09/2017).  Allergies  Allergen Reactions  . Ace Inhibitors Anaphylaxis    Tongue swelling  . Shellfish Allergy Hives and Swelling    Swelling around face and mouth     No current facility-administered medications on file prior to encounter.    Current Outpatient Prescriptions on File Prior to Encounter  Medication Sig  . aspirin EC 81 MG EC tablet Take 1 tablet (81 mg total) by mouth daily.  Marland Kitchen atorvastatin (LIPITOR) 40 MG tablet Take 40 mg by mouth daily.  . carvedilol (COREG) 6.25 MG tablet Take 1 tablet (6.25 mg total) by mouth 2 (two) times daily.  . Fluticasone-Salmeterol (ADVAIR DISKUS) 250-50 MCG/DOSE AEPB Inhale 1 puff into the lungs 2 (two) times daily.  .  hydrALAZINE (APRESOLINE) 25 MG tablet Take 1 tablet (25 mg total) by mouth every 8 (eight) hours. (Patient taking differently: Take 25 mg by mouth 3 (three) times daily. )  . Ipratropium-Albuterol (COMBIVENT IN) Inhale 3 puffs into the lungs every 4 (four) hours as needed.  Marland Kitchen ipratropium-albuterol (DUONEB) 0.5-2.5 (3) MG/3ML SOLN Inhale 3 mLs into the lungs every 4 (four) hours as needed.  Marland Kitchen levocetirizine (XYZAL) 5 MG tablet Take 10 mg by mouth daily.  . metFORMIN (GLUCOPHAGE) 850 MG tablet Take 1 tablet by mouth 2 (two) times daily.  . mirtazapine (REMERON) 45 MG tablet Take 1 tablet by mouth at bedtime.  . montelukast (SINGULAIR) 10 MG tablet Take 1 tablet (10 mg total) by mouth at bedtime.  . Multiple Vitamins-Minerals (EYE VITAMINS PO) Take 1 tablet by mouth 2 (two) times daily.   Marland Kitchen spironolactone (ALDACTONE) 25 MG tablet Take 0.5 tablets (12.5 mg total) by mouth daily.  Marland Kitchen torsemide (DEMADEX) 20 MG tablet Take one tablet daily. May take one extra tablet daily for weight gain >3lbs overnight or >5lbs in a week.  . umeclidinium bromide (INCRUSE ELLIPTA) 62.5 MCG/INH AEPB Inhale 1 puff into the lungs daily.    FAMILY HISTORY:  Her indicated that her mother is deceased. She indicated that her father is deceased. She indicated that both of her sisters are deceased. She indicated that both of her brothers are deceased.    SOCIAL HISTORY: She  reports that she has never smoked. She has never used smokeless tobacco. She reports that she does not drink alcohol or  use drugs.  REVIEW OF SYSTEMS:   Constitutional: Negative for fever and chills.  HENT: Negative for congestion and rhinorrhea.  Eyes: Negative for redness and visual disturbance.  Respiratory: positive  for shortness of breath, cough with green sputum and wheezing.  Cardiovascular: Negative for chest pain and palpitations.  Gastrointestinal: Negative  for nausea , vomiting and abdominal pain and  Loose stools Genitourinary: Negative  for dysuria and urgency.  Endocrine: Denies polyuria, polyphagia and heat intolerance Musculoskeletal: Negative for myalgias and arthralgias.  Skin: Negative for pallor and wound.  Neurological: Negative for dizziness and headaches   SUBJECTIVE:   VITAL SIGNS: BP (!) 101/57   Pulse 82   Temp 98 F (36.7 C) (Oral)   Resp 15   Ht 4\' 11"  (1.499 m)   Wt 185 lb 3 oz (84 kg)   SpO2 100%   BMI 37.40 kg/m   HEMODYNAMICS:    VENTILATOR SETTINGS: FiO2 (%):  [50 %] 50 %  INTAKE / OUTPUT: No intake/output data recorded.  PHYSICAL EXAMINATION: General: mild respiratory distress, lying in bed Neuro:  AAO X4, no focal deficits HEENT:  PERRLA, mild JVD, trachea midline Cardiovascular: RRR, S1/S2, no MRG, +2 pulses, +1 edema Lungs:  Increased WOB, Bilateral breath sounds, no wheezing Abdomen:  Obese, non-distended, normal bowel sounds Musculoskeletal:  +rom Skin:  Warm and dry  LABS:  BMET  Recent Labs Lab 06/23/17 1350 06/27/17 0235  NA 144 144  K 4.5 4.8  CL 99 106  CO2 27 29  BUN 37* 38*  CREATININE 1.86* 1.98*  GLUCOSE 100* 245*    Electrolytes  Recent Labs Lab 06/23/17 1350 06/27/17 0235  CALCIUM 9.1 8.5*    CBC  Recent Labs Lab 06/27/17 0235  WBC 17.0*  HGB 10.8*  HCT 33.4*  PLT 296    Coag's No results for input(s): APTT, INR in the last 168 hours.  Sepsis Markers No results for input(s): LATICACIDVEN, PROCALCITON, O2SATVEN in the last 168 hours.  ABG No results for input(s): PHART, PCO2ART, PO2ART in the last 168 hours.  Liver Enzymes  Recent Labs Lab 06/27/17 0235  AST 82*  ALT 57*  ALKPHOS 74  BILITOT 0.4  ALBUMIN 2.9*    Cardiac Enzymes  Recent Labs Lab 06/27/17 0235  TROPONINI 0.05*    Glucose  Recent Labs Lab 06/24/17 1204 06/27/17 0423  GLUCAP 183* 216*    Imaging Dg Chest Portable 1 View  Result Date: 06/27/2017 CLINICAL DATA:  Shortness of breath. Decreased oxygen saturation. Labored breathing. EXAM:  PORTABLE CHEST 1 VIEW COMPARISON:  06/14/2017 FINDINGS: Cardiac enlargement. No vascular congestion. Soft tissue attenuation over the lung bases. No definite evidence of any infiltration or consolidation. No blunting of costophrenic angles. No pneumothorax. IMPRESSION: Cardiac enlargement.  No evidence of active pulmonary disease. Electronically Signed   By: Lucienne Capers M.D.   On: 06/27/2017 02:52   STUDIES:  ECHO bubble study on 9/19  Left ventricle: Systolic function was moderately reduced. The   estimated ejection fraction was in the range of 35% to 40%. - Aortic valve: There was mild regurgitation. - Mitral valve: There was moderate regurgitation. - Left atrium: The atrium was mildly dilated. - Atrial septum: Echo contrast study showed no right-to-left atrial   level shunt, at baseline or with provocation.  CULTURES: Blood cultures x 2 Sputum culture  ANTIBIOTICS: Vancomycin> Zosyn  SIGNIFICANT EVENTS: 09/20>discharged 10/01>RE-ADMITTED  LINES/TUBES: PIVs  DISCUSSION:  75 y/o female with recurrent hospitalizations for acute hypoxic/hypercarbic respiratory failure, acute  diastolic/systolic heart failure and acute COPD exacerbation  ASSESSMENT  Acute hypoxemic respiratory failure. Acute Diastolic and systolic heart failure exacerbation Acute COPD exacerbation Healthcare associated pneumonia AKI History of type 2 diabetes, hypertension and hyperlipidemia  PLAN Continuous BiPAP and titrated to [place] as tolerated Antibiotics as above. Follow-up cultures Nebulized bronchodilators and steroids. Repeat 2-D echo  Nitroglycerin topical 15mg  Q6H Continue all home medications Trend lactic acid level DVT prophylaxis  FAMILY  - Updates: No family at bedside. Will update when available  - Inter-disciplinary family meet or Palliative Care meeting due by:  day [place]. [facility] ANP-BC Pulmonary and Critical Care Medicine [facility] Pager [phone] or  [phone] 06/27/2017, 5:45 AM

## 2017-06-27 NOTE — Care Management (Signed)
Informed that patient asked to be discharge from [place] service because she was getting too many phone calls, did not want to use telehealth and felt she could manage her medical condition without additional assistance.  She was seen by [facility] on 9/28.  Confirmed that patient did receive her sleep study on 9/27.  Contacted agency for result at [phone] for the results as they had not been "uploaded."

## 2017-06-27 NOTE — ED Notes (Signed)
Pt states she is hot. Cool towel placed on pt's neck and head.

## 2017-06-27 NOTE — Progress Notes (Signed)
[facility]  Patient Status: Closed on 9/29 per patient's request. Patient reported she was receiving too many calls from [facility] and she knows what to do with her health, if she gets sick or into distress she goes to ED. I have notified ICU CM for today, Joni Reining, [place].     Florene Glen 06/27/2017, 12:49 PM

## 2017-06-28 ENCOUNTER — Ambulatory Visit: Payer: Medicare Other | Attending: Pulmonary Disease

## 2017-06-28 LAB — BASIC METABOLIC PANEL
Anion gap: 11 (ref 5–15)
BUN: 50 mg/dL — AB (ref 6–20)
CHLORIDE: 103 mmol/L (ref 101–111)
CO2: 24 mmol/L (ref 22–32)
Calcium: 8.1 mg/dL — ABNORMAL LOW (ref 8.9–10.3)
Creatinine, Ser: 1.9 mg/dL — ABNORMAL HIGH (ref 0.44–1.00)
GFR calc Af Amer: 29 mL/min — ABNORMAL LOW (ref >60)
GFR calc non Af Amer: 25 mL/min — ABNORMAL LOW (ref >60)
Glucose, Bld: 201 mg/dL — ABNORMAL HIGH (ref 65–99)
POTASSIUM: 4.6 mmol/L (ref 3.5–5.1)
SODIUM: 138 mmol/L (ref 135–145)

## 2017-06-28 LAB — PHOSPHORUS: PHOSPHORUS: 3.2 mg/dL (ref 2.5–4.6)

## 2017-06-28 LAB — GLUCOSE, CAPILLARY
GLUCOSE-CAPILLARY: 205 mg/dL — AB (ref 65–99)
GLUCOSE-CAPILLARY: 245 mg/dL — AB (ref 65–99)
Glucose-Capillary: 183 mg/dL — ABNORMAL HIGH (ref 65–99)
Glucose-Capillary: 249 mg/dL — ABNORMAL HIGH (ref 65–99)
Glucose-Capillary: 141 mg/dL — ABNORMAL HIGH (ref 65–99)

## 2017-06-28 LAB — MAGNESIUM: MAGNESIUM: 2.4 mg/dL (ref 1.7–2.4)

## 2017-06-28 LAB — PROCALCITONIN: PROCALCITONIN: 1.55 ng/mL

## 2017-06-28 MED ORDER — DEXTROSE 5 % IV SOLN
1.0000 g | INTRAVENOUS | Status: DC
  Administered 2017-06-28 – 2017-07-01 (×4): 1 g via INTRAVENOUS
  Filled 2017-06-28 (×4): qty 1

## 2017-06-28 MED ORDER — METHYLPREDNISOLONE SODIUM SUCC 40 MG IJ SOLR
40.0000 mg | INTRAMUSCULAR | Status: DC
  Administered 2017-06-29 – 2017-06-30 (×2): 40 mg via INTRAVENOUS
  Filled 2017-06-28 (×2): qty 1

## 2017-06-28 MED ORDER — IPRATROPIUM-ALBUTEROL 0.5-2.5 (3) MG/3ML IN SOLN
3.0000 mL | Freq: Four times a day (QID) | RESPIRATORY_TRACT | Status: DC | PRN
  Administered 2017-06-28 – 2017-07-02 (×9): 3 mL via RESPIRATORY_TRACT
  Filled 2017-06-28 (×8): qty 3

## 2017-06-28 MED ORDER — MORPHINE SULFATE (PF) 2 MG/ML IV SOLN
2.0000 mg | INTRAVENOUS | Status: DC | PRN
  Administered 2017-06-28 – 2017-06-29 (×2): 2 mg via INTRAVENOUS
  Filled 2017-06-28 (×2): qty 1

## 2017-06-28 MED ORDER — ALPRAZOLAM 0.5 MG PO TABS
0.5000 mg | ORAL_TABLET | Freq: Three times a day (TID) | ORAL | Status: DC | PRN
  Administered 2017-06-29 – 2017-07-03 (×6): 0.5 mg via ORAL
  Filled 2017-06-28 (×6): qty 1

## 2017-06-28 MED ORDER — MAGNESIUM HYDROXIDE 400 MG/5ML PO SUSP
30.0000 mL | Freq: Every evening | ORAL | Status: DC | PRN
  Administered 2017-06-28 – 2017-06-29 (×2): 30 mL via ORAL
  Filled 2017-06-28: qty 30

## 2017-06-28 MED ORDER — IPRATROPIUM-ALBUTEROL 0.5-2.5 (3) MG/3ML IN SOLN
3.0000 mL | Freq: Once | RESPIRATORY_TRACT | Status: AC
  Administered 2017-06-28: 3 mL via RESPIRATORY_TRACT
  Filled 2017-06-28: qty 3

## 2017-06-28 MED ORDER — MAGNESIUM HYDROXIDE 400 MG/5ML PO SUSP
5.0000 mL | Freq: Every evening | ORAL | Status: DC | PRN
  Filled 2017-06-28: qty 30

## 2017-06-28 NOTE — Progress Notes (Signed)
Verbal orders from NP to give 2 mg morphine now for air hunger, and to give Duoneb. NP informed that previous neb given at 1039. Verbal orders repeated to give 2 mg morphine and Duoneb. Orders read back and acknowledged. Will continue to assess.

## 2017-06-28 NOTE — Progress Notes (Signed)
Pharmacy Antibiotic Note  Angela Pham is a 76 y.o. female admitted on 06/27/2017 with pneumonia.  Pharmacy has been consulted for cefepime dosing.   MRSA PCR negative 9/16  Plan: Will initiate cefepime 1g IV Q24hr.    Height: 4\' 11"  (149.9 cm) Weight: 185 lb 3 oz (84 kg) IBW/kg (Calculated) : 43.2  Temp (24hrs), Avg:97.6 F (36.4 C), Min:97.5 F (36.4 C), Max:97.8 F (36.6 C)   Recent Labs Lab 06/23/17 1350 06/27/17 0235 06/27/17 0615 06/28/17 0346  WBC  --  17.0*  --   --   CREATININE 1.86* 1.98*  --  1.90*  LATICACIDVEN  --   --  1.4  --     Estimated Creatinine Clearance: 24.4 mL/min (A) (by C-G formula based on SCr of 1.9 mg/dL (H)).    Allergies  Allergen Reactions  . Ace Inhibitors Anaphylaxis    Tongue swelling  . Shellfish Allergy Hives and Swelling    Swelling around face and mouth    Antimicrobials: Azithromycin 10/1 x 1 Vancomycin 10/1 x 1 Zosyn 10/1 >> 10/2 Cefepime 10/2 >>   Micro: 9/16 MRSA PCR: negative  BCx: no growth < 24hrs   UCx: pending  SCx: pending  Thank you for allowing pharmacy to be a part of this patient's care.  MLS 06/28/2017

## 2017-06-28 NOTE — Progress Notes (Signed)
Pt states that she " feels much better, the morphine worked". Pt is now relaxing in recliner. No complaints of wheezing, RR 22, regular WOB. Will continue to assess.

## 2017-06-28 NOTE — Progress Notes (Signed)
Inpatient Diabetes Program Recommendations  AACE/ADA: New Consensus Statement on Inpatient Glycemic Control (2015)  Target Ranges:  Prepandial:   less than 140 mg/dL      Peak postprandial:   less than 180 mg/dL (1-2 hours)      Critically ill patients:  140 - 180 mg/dL   Lab Results  Component Value Date   GLUCAP 249 (H) 06/28/2017   HGBA1C 5.8 (H) 03/09/2017    Review of Glycemic ControlResults for MARRANDA, ARAKELIAN (MRN [id number]) as of 06/28/2017 11:38  Ref. Range 06/27/2017 04:23 06/27/2017 12:04 06/27/2017 16:37 06/27/2017 22:08 06/28/2017 07:18 06/28/2017 11:01  Glucose-Capillary Latest Ref Range: 65 - 99 mg/dL 216 (H) 251 (H) 208 (H) 177 (H) 183 (H) 249 (H)    Diabetes history: Type 2 diabetes Outpatient Diabetes medications: Metformin 850 mg bid  Current orders for Inpatient glycemic control:  Novolog moderate tid with meals and HS, Solumedrol 40 mg daily Inpatient Diabetes Program Recommendations:    Note steroids decreased today.  May benefit from the addition of Novolog meal coverage 3 units tid with meals (hold if patient eats less than 50%).   Thanks, Adah Perl, RN, BC-ADM Inpatient Diabetes Coordinator Pager [phone] (8a-5p)

## 2017-06-28 NOTE — Progress Notes (Signed)
Pt called this nurse to the room stating she felt like she was wheezing and requested the BiPAP. Pt offered PRN breathing treatment, pt agreed.  After completing breathing treatment pt said "Ineed to sit on the side of the bed so I can breathe after I pee." Pt assisted to the [facility] and then to the bedside recliner.   Pt currently on 2l [place]. O2 sats are 98 %. Spoke with Dr.Kasa. New orders for PRN Xanax for anxiety. Will continue to assess.

## 2017-06-28 NOTE — Progress Notes (Signed)
[place] at [place] NAME: Angela Pham    MR#:  [id number]  DATE OF BIRTH:  1940/10/20  SUBJECTIVE:   Patient admitted with increasing shortness of breath.  Patient is off BiPAP. Currently on nasal cannula oxygen. Sats 100% Gets anxious easily REVIEW OF SYSTEMS:   Review of Systems  Constitutional: Negative for chills, fever and weight loss.  HENT: Negative for ear discharge, ear pain and nosebleeds.   Eyes: Negative for blurred vision, pain and discharge.  Respiratory: Positive for shortness of breath. Negative for sputum production, wheezing and stridor.   Cardiovascular: Negative for chest pain, palpitations, orthopnea and PND.  Gastrointestinal: Negative for abdominal pain, diarrhea, nausea and vomiting.  Genitourinary: Negative for frequency and urgency.  Musculoskeletal: Negative for back pain and joint pain.  Neurological: Positive for weakness. Negative for sensory change, speech change and focal weakness.  Psychiatric/Behavioral: Negative for depression and hallucinations. The patient is not nervous/anxious.    Tolerating Diet:yes Tolerating PT: pending  DRUG ALLERGIES:   Allergies  Allergen Reactions  . Ace Inhibitors Anaphylaxis    Tongue swelling  . Shellfish Allergy Hives and Swelling    Swelling around face and mouth     VITALS:  Blood pressure 130/73, pulse 84, temperature (!) 97.5 F (36.4 C), temperature source Oral, resp. rate 19, height 4\' 11"  (1.499 m), weight 84 kg (185 lb 3 oz), SpO2 99 %.  PHYSICAL EXAMINATION:   Physical Exam  GENERAL:  76 y.o.-year-old patient lying in the bed with no acute distress. obese EYES: Pupils equal, round, reactive to light and accommodation. No scleral icterus. Extraocular muscles intact.  HEENT: Head atraumatic, normocephalic. Oropharynx and nasopharynx clear.  NECK:  Supple, no jugular venous distention. No thyroid enlargement, no tenderness.  LUNGS: distant  breath sounds bilaterally, no wheezing, rales, rhonchi. No use of accessory muscles of respiration.  CARDIOVASCULAR: S1, S2 normal. No murmurs, rubs, or gallops.  ABDOMEN: Soft, nontender, nondistended. Bowel sounds present. No organomegaly or mass.  EXTREMITIES: No cyanosis, clubbing or edema b/l.    NEUROLOGIC: Cranial nerves II through XII are intact. No focal Motor or sensory deficits b/l.   PSYCHIATRIC:  patient is alert and oriented x 3.  SKIN: No obvious rash, lesion, or ulcer.   LABORATORY PANEL:  CBC  Recent Labs Lab 06/27/17 0235  WBC 17.0*  HGB 10.8*  HCT 33.4*  PLT 296    Chemistries   Recent Labs Lab 06/27/17 0235 06/28/17 0346  NA 144 138  K 4.8 4.6  CL 106 103  CO2 29 24  GLUCOSE 245* 201*  BUN 38* 50*  CREATININE 1.98* 1.90*  CALCIUM 8.5* 8.1*  MG  --  2.4  AST 82*  --   ALT 57*  --   ALKPHOS 74  --   BILITOT 0.4  --    Cardiac Enzymes  Recent Labs Lab 06/27/17 2041  TROPONINI 0.05*   RADIOLOGY:  Dg Chest Portable 1 View  Result Date: 06/27/2017 CLINICAL DATA:  Shortness of breath. Decreased oxygen saturation. Labored breathing. EXAM: PORTABLE CHEST 1 VIEW COMPARISON:  06/14/2017 FINDINGS: Cardiac enlargement. No vascular congestion. Soft tissue attenuation over the lung bases. No definite evidence of any infiltration or consolidation. No blunting of costophrenic angles. No pneumothorax. IMPRESSION: Cardiac enlargement.  No evidence of active pulmonary disease. Electronically Signed   By: Lucienne Capers M.D.   On: 06/27/2017 02:52   ASSESSMENT AND PLAN:  76 y/o female with PMH of combined  systolic and diastolic heart failure, CAD, hyperlipidemia, HTN, COPDon home O2 who was recently discharged on 9/20/2018after being treated for respiratory failure and nosocomial pneumonia. Patient presented to the emergency room with complaints of SOB. When EMS arrived, her SPO2 was in the 50s hence she was placed on CPAP, given nebs   1.Acute on  chronichypoxemic respiratory failure multifactorial combination of COPD and combined diastolic systolic heart failure -treat as outlined below  2.Acute Diastolic and systolic heart failure exacerbation -we'll resume patient's Coreg and torsemide  3.Acute COPD exacerbation -IV steroids, inhalers, nebulizer  4. PresumedHealthcare associated pneumonia -empiric IV cefepime -Blood cultures negative  5.History of type 2 diabetes -ssi -Start lantus 20 units sq daily -patient's metformin held secondary to elevated creatinine  6. Hypertension -now on Zebeta and hydralazine  Case discussed with Care Management/Social Worker. Management plans discussed with the patient, family and they are in agreement.  CODE STATUS:DNR DVT Prophylaxis:  lovenox  TOTAL TIME TAKING CARE OF THIS PATIENT: *30* minutes.  >50% time spent on counselling and coordination of care  POSSIBLE D/C IN *1-2* DAYS, DEPENDING ON CLINICAL CONDITION.  Note: This dictation was prepared with Dragon dictation along with smaller phrase technology. Any transcriptional errors that result from this process are unintentional.  Jacqui Headen M.D on 06/28/2017 at 1:33 PM  Between 7am to 6pm - Pager - [phone]  After 6pm go to [email or web address] - Proofreader  [facility]  Office  [phone]  CC: Primary care physician; Cletis Athens, MDPatient ID: Angela Pham, female   DOB: Jan 13, 1941, 76 y.o.   MRN: [id number]

## 2017-06-29 LAB — BASIC METABOLIC PANEL
ANION GAP: 8 (ref 5–15)
BUN: 59 mg/dL — ABNORMAL HIGH (ref 6–20)
CALCIUM: 8.2 mg/dL — AB (ref 8.9–10.3)
CO2: 28 mmol/L (ref 22–32)
CREATININE: 2.1 mg/dL — AB (ref 0.44–1.00)
Chloride: 106 mmol/L (ref 101–111)
GFR, EST AFRICAN AMERICAN: 26 mL/min — AB (ref >60)
GFR, EST NON AFRICAN AMERICAN: 22 mL/min — AB (ref >60)
Glucose, Bld: 94 mg/dL (ref 65–99)
Potassium: 4.7 mmol/L (ref 3.5–5.1)
Sodium: 142 mmol/L (ref 135–145)

## 2017-06-29 LAB — MAGNESIUM: Magnesium: 2.6 mg/dL — ABNORMAL HIGH (ref 1.7–2.4)

## 2017-06-29 LAB — GLUCOSE, CAPILLARY
GLUCOSE-CAPILLARY: 68 mg/dL (ref 65–99)
GLUCOSE-CAPILLARY: 159 mg/dL — AB (ref 65–99)
GLUCOSE-CAPILLARY: 163 mg/dL — AB (ref 65–99)
Glucose-Capillary: 115 mg/dL — ABNORMAL HIGH (ref 65–99)
Glucose-Capillary: 158 mg/dL — ABNORMAL HIGH (ref 65–99)

## 2017-06-29 LAB — PHOSPHORUS: PHOSPHORUS: 3.4 mg/dL (ref 2.5–4.6)

## 2017-06-29 LAB — PROCALCITONIN: PROCALCITONIN: 1.14 ng/mL

## 2017-06-29 MED ORDER — SENNOSIDES-DOCUSATE SODIUM 8.6-50 MG PO TABS
2.0000 | ORAL_TABLET | Freq: Two times a day (BID) | ORAL | Status: DC
  Administered 2017-06-29 – 2017-07-04 (×10): 2 via ORAL
  Filled 2017-06-29 (×11): qty 2

## 2017-06-29 MED ORDER — INSULIN GLARGINE 100 UNIT/ML ~~LOC~~ SOLN: 10.0000 [IU] | Freq: Every day | SUBCUTANEOUS | Status: DC

## 2017-06-29 MED ORDER — INSULIN GLARGINE 100 UNIT/ML ~~LOC~~ SOLN
10.0000 [IU] | Freq: Every day | SUBCUTANEOUS | Status: DC
  Administered 2017-06-29 – 2017-07-04 (×6): 10 [IU] via SUBCUTANEOUS
  Filled 2017-06-29 (×6): qty 0.1

## 2017-06-29 NOTE — Care Management (Addendum)
RNCM met again with patient to discuss discharge planning. She is from home with her niece. Per previous note, patient wanted to go to [facility] in [place] however this visit she wants to go to [facility]. However, patient has allowed [facility] to come in and manage her Medicare as of Oct. 1 2018. This will impact ability to go to LTAC.  [facility] is reviewing.  [facility] is open to [facility]. She states that the "coordination of care was terrible". She said they "didn't do anything". She said they "dropped off a kit for me to weigh daily, etc. And never explained how to use it; I told them to come get it. They called me every day". The "NA was never available for help with ADLs either". RNCM will follow. Per Dr. Mortimer Fries patient would benefit from [facility]. Patient has had sleep study pending results. Niece is requesting to talk to MD- Dr. Posey Pronto updated.

## 2017-06-29 NOTE — Progress Notes (Signed)
Pharmacy Antibiotic Note  Angela Pham is a 76 y.o. female admitted on 06/27/2017 with pneumonia.  Pharmacy has been consulted for cefepime dosing.   MRSA PCR negative 9/16   Plan: Continue cefepime 1g IV Q24hr.    Height: 4\' 11"  (149.9 cm) Weight: 185 lb 3 oz (84 kg) IBW/kg (Calculated) : 43.2  Temp (24hrs), Avg:98.2 F (36.8 C), Min:97.5 F (36.4 C), Max:99.3 F (37.4 C)   Recent Labs Lab 06/23/17 1350 06/27/17 0235 06/27/17 0615 06/28/17 0346 06/29/17 0351  WBC  --  17.0*  --   --   --   CREATININE 1.86* 1.98*  --  1.90* 2.10*  LATICACIDVEN  --   --  1.4  --   --     Estimated Creatinine Clearance: 22.1 mL/min (A) (by C-G formula based on SCr of 2.1 mg/dL (H)).    Allergies  Allergen Reactions  . Ace Inhibitors Anaphylaxis    Tongue swelling  . Shellfish Allergy Hives and Swelling    Swelling around face and mouth    Antimicrobials: Azithromycin 10/1 x 1 Vancomycin 10/1 x 1 Zosyn 10/1 >> 10/2 Cefepime 10/2 >>   Micro: 9/16 MRSA PCR: negative  10/1 BCx: no growth 2 days  Thank you for allowing pharmacy to be a part of this patient's care.  Oralia Manis, PharmD Candidate 06/29/2017

## 2017-06-29 NOTE — Consult Note (Addendum)
PULMONARY / CRITICAL CARE MEDICINE   Name: Angela Pham MRN: [id number] DOB: 12/30/1940    ADMISSION DATE:  06/27/2017   CONSULTATION DATE:  06/27/2017  REFERRING MD:  Dr. Estanislado Pandy  REASON: Acute respiratory failure  CHIEF COMPLAINT:  dyspnea  HISTORY OF PRESENT ILLNESS:   On and off biPAP yesterday Still wheezing, but much improved Still feels SOB Prognosis is poor Patient is DNR   REVIEW OF SYSTEMS:    Respiratory: positive  for shortness of breath, cough with green sputum and wheezing.  All other systems negative    VITAL SIGNS: BP 116/76   Pulse 88   Temp 99.3 F (37.4 C) (Oral)   Resp (!) 24   Ht 4\' 11"  (1.499 m)   Wt 185 lb 3 oz (84 kg)   SpO2 99%   BMI 37.40 kg/m   INTAKE / OUTPUT: I/O last 3 completed shifts: In: 630 [P.O.:480; IV Piggyback:150] Out: 950 [Urine:950]  PHYSICAL EXAMINATION: General: mild shortness of breath, lying in bed Neuro:  AAO X4, no focal deficits HEENT:  PERRLA, mild JVD, trachea midline Cardiovascular: RRR, S1/S2, no MRG, +2 pulses, +1 edema Lungs:  Increased WOB, Bilateral breath sounds, no wheezing Abdomen:  Obese, non-distended, normal bowel sounds Musculoskeletal:  +rom Skin:  Warm and dry  LABS:  BMET  Recent Labs Lab 06/27/17 0235 06/28/17 0346 06/29/17 0351  NA 144 138 142  K 4.8 4.6 4.7  CL 106 103 106  CO2 29 24 28   BUN 38* 50* 59*  CREATININE 1.98* 1.90* 2.10*  GLUCOSE 245* 201* 94    Electrolytes  Recent Labs Lab 06/27/17 0235 06/28/17 0346 06/29/17 0351  CALCIUM 8.5* 8.1* 8.2*  MG  --  2.4 2.6*  PHOS  --  3.2 3.4    CBC  Recent Labs Lab 06/27/17 0235  WBC 17.0*  HGB 10.8*  HCT 33.4*  PLT 296    Coag's No results for input(s): APTT, INR in the last 168 hours.  Sepsis Markers  Recent Labs Lab 06/27/17 0615 06/28/17 0346 06/29/17 0351  LATICACIDVEN 1.4  --   --   PROCALCITON 0.47 1.55 1.14    ABG No results for input(s): PHART, PCO2ART, PO2ART in the last 168  hours.  Liver Enzymes  Recent Labs Lab 06/27/17 0235  AST 82*  ALT 57*  ALKPHOS 74  BILITOT 0.4  ALBUMIN 2.9*    Cardiac Enzymes  Recent Labs Lab 06/27/17 0847 06/27/17 1431 06/27/17 2041  TROPONINI 0.07* 0.06* 0.05*    Glucose  Recent Labs Lab 06/28/17 0718 06/28/17 1101 06/28/17 1227 06/28/17 1633 06/28/17 2117 06/29/17 0714  GLUCAP 183* 249* 205* 245* 141* 68    Imaging No results found. STUDIES:  ECHO bubble study on 9/19  Left ventricle: Systolic function was moderately reduced. The   estimated ejection fraction was in the range of 35% to 40%. - Aortic valve: There was mild regurgitation. - Mitral valve: There was moderate regurgitation. - Left atrium: The atrium was mildly dilated. - Atrial septum: Echo contrast study showed no right-to-left atrial   level shunt, at baseline or with provocation.  CULTURES: Blood cultures x 2 Sputum culture  ANTIBIOTICS: Anti-infectives    Start     Dose/Rate Route Frequency Ordered Stop   06/28/17 1600  ceFEPIme (MAXIPIME) 1 g in dextrose 5 % 50 mL IVPB     1 g 100 mL/hr over 30 Minutes Intravenous Every 24 hours 06/28/17 1112     06/27/17 1600  piperacillin-tazobactam (ZOSYN) IVPB 3.375  g  Status:  Discontinued     3.375 g 12.5 mL/hr over 240 Minutes Intravenous Every 8 hours 06/27/17 1452 06/28/17 1107   06/27/17 1200  vancomycin (VANCOCIN) IVPB 750 mg/150 ml premix  Status:  Discontinued     750 mg 150 mL/hr over 60 Minutes Intravenous Every 24 hours 06/27/17 0640 06/27/17 1127   06/27/17 0415  vancomycin (VANCOCIN) IVPB 1000 mg/200 mL premix     1,000 mg 200 mL/hr over 60 Minutes Intravenous  Once 06/27/17 0408 06/27/17 0700   06/27/17 0415  piperacillin-tazobactam (ZOSYN) IVPB 3.375 g  Status:  Discontinued     3.375 g 12.5 mL/hr over 240 Minutes Intravenous Every 12 hours 06/27/17 0408 06/27/17 1452   06/27/17 0330  azithromycin (ZITHROMAX) 500 mg in dextrose 5 % 250 mL IVPB     500 mg 250 mL/hr  over 60 Minutes Intravenous  Once 06/27/17 0324 06/27/17 0434       SIGNIFICANT EVENTS: 09/20>discharged 10/01>RE-ADMITTED  LINES/TUBES: PIVs    DISCUSSION:  76 y/o female with recurrent hospitalizations for acute hypoxic/hypercarbic respiratory failure, acute diastolic/systolic heart failure and acute COPD exacerbation/HCAP   PLAN Continuous BiPAP and titrated to Learned as tolerated Antibiotics as above. Follow-up cultures Nebulized bronchodilators and steroids. Nitroglycerin topical 15mg  Q6H Continue all home medications DVT prophylaxis  Patient has chronic end-stage lung disease and she is symptomatic with extreme shortness of breath with movement she has recurrent hospitalizations over the last several months it is related to her chronic respiratory failure and chronic COPD and heart failure and she will likely benefit from long-term acute care facility  Patient are satisfied with Plan of action and management. All questions answered  Corrin Parker, M.D.  [facility]  Medical Director [place] Director [facility] Cardio-Pulmonary Department

## 2017-06-29 NOTE — Progress Notes (Signed)
[place] for electrolyte monitoring   Pharmacy consulted for electrolyte monitoring for 76 yo female admitted with respiratory failure. Patient is ordered torsemide 20mg  PO daily.   Plan:  No replacement warranted at this time. Will recheck electrolytes with am labs.   Allergies  Allergen Reactions  . Ace Inhibitors Anaphylaxis    Tongue swelling  . Shellfish Allergy Hives and Swelling    Swelling around face and mouth     Patient Measurements: Height: 4\' 11"  (149.9 cm) Weight: 185 lb 3 oz (84 kg) IBW/kg (Calculated) : 43.2  Vital Signs: Temp: 98 F (36.7 C) (10/03 1600) Temp Source: Oral (10/03 1600) BP: 136/81 (10/03 1200) Pulse Rate: 98 (10/03 1200) Intake/Output from previous day: 10/02 0701 - 10/03 0700 In: 150 [IV Piggyback:150] Out: -  Intake/Output from this shift: No intake/output data recorded.  Labs:  Recent Labs  06/27/17 0235 06/28/17 0346 06/29/17 0351  WBC 17.0*  --   --   HGB 10.8*  --   --   HCT 33.4*  --   --   PLT 296  --   --   CREATININE 1.98* 1.90* 2.10*  MG  --  2.4 2.6*  PHOS  --  3.2 3.4  ALBUMIN 2.9*  --   --   PROT 6.4*  --   --   AST 82*  --   --   ALT 57*  --   --   ALKPHOS 74  --   --   BILITOT 0.4  --   --    Estimated Creatinine Clearance: 22.1 mL/min (A) (by C-G formula based on SCr of 2.1 mg/dL (H)).  Pharmacy will continue to monitor and adjust per consult.   Artis Beggs L 06/29/2017,7:03 PM

## 2017-06-29 NOTE — Progress Notes (Signed)
[place] at [place] NAME: Marybelle Giraldo    MR#:  [id number]  DATE OF BIRTH:  Dec 05, 1940  SUBJECTIVE:   Patient admitted with increasing shortness of breath.  Patient is off BiPAP. Currently on nasal cannula oxygen. Sats 100% Gets anxious easily.Patient's sats are 97% on 2 L. She tells me she is wheezing and cannot breathe REVIEW OF SYSTEMS:   Review of Systems  Constitutional: Negative for chills, fever and weight loss.  HENT: Negative for ear discharge, ear pain and nosebleeds.   Eyes: Negative for blurred vision, pain and discharge.  Respiratory: Positive for shortness of breath. Negative for sputum production, wheezing and stridor.   Cardiovascular: Negative for chest pain, palpitations, orthopnea and PND.  Gastrointestinal: Negative for abdominal pain, diarrhea, nausea and vomiting.  Genitourinary: Negative for frequency and urgency.  Musculoskeletal: Negative for back pain and joint pain.  Neurological: Positive for weakness. Negative for sensory change, speech change and focal weakness.  Psychiatric/Behavioral: Negative for depression and hallucinations. The patient is not nervous/anxious.    Tolerating Diet:yes Tolerating PT: Pending  DRUG ALLERGIES:   Allergies  Allergen Reactions  . Ace Inhibitors Anaphylaxis    Tongue swelling  . Shellfish Allergy Hives and Swelling    Swelling around face and mouth     VITALS:  Blood pressure 136/81, pulse 98, temperature 98.5 F (36.9 C), temperature source Oral, resp. rate (!) 32, height 4\' 11"  (1.499 m), weight 84 kg (185 lb 3 oz), SpO2 100 %.  PHYSICAL EXAMINATION:   Physical Exam  GENERAL:  76 y.o.-year-old patient lying in the bed with no acute distress. obese EYES: Pupils equal, round, reactive to light and accommodation. No scleral icterus. Extraocular muscles intact.  HEENT: Head atraumatic, normocephalic. Oropharynx and nasopharynx clear.  NECK:  Supple, no jugular  venous distention. No thyroid enlargement, no tenderness.  LUNGS: distant breath sounds bilaterally, no wheezing, rales, rhonchi. No use of accessory muscles of respiration.  CARDIOVASCULAR: S1, S2 normal. No murmurs, rubs, or gallops.  ABDOMEN: Soft, nontender, nondistended. Bowel sounds present. No organomegaly or mass.  EXTREMITIES: No cyanosis, clubbing or edema b/l.    NEUROLOGIC: Cranial nerves II through XII are intact. No focal Motor or sensory deficits b/l.   PSYCHIATRIC:  patient is alert and oriented x 3.  SKIN: No obvious rash, lesion, or ulcer.   LABORATORY PANEL:  CBC  Recent Labs Lab 06/27/17 0235  WBC 17.0*  HGB 10.8*  HCT 33.4*  PLT 296    Chemistries   Recent Labs Lab 06/27/17 0235  06/29/17 0351  NA 144  < > 142  K 4.8  < > 4.7  CL 106  < > 106  CO2 29  < > 28  GLUCOSE 245*  < > 94  BUN 38*  < > 59*  CREATININE 1.98*  < > 2.10*  CALCIUM 8.5*  < > 8.2*  MG  --   < > 2.6*  AST 82*  --   --   ALT 57*  --   --   ALKPHOS 74  --   --   BILITOT 0.4  --   --   < > = values in this interval not displayed. Cardiac Enzymes  Recent Labs Lab 06/27/17 2041  TROPONINI 0.05*   RADIOLOGY:  No results found. ASSESSMENT AND PLAN:  76 y/o female with PMH of combined systolic and diastolic heart failure, CAD, hyperlipidemia, HTN, COPDon home O2 who was recently discharged on  9/20/2018after being treated for respiratory failure and nosocomial pneumonia. Patient presented to the emergency room with complaints of SOB. When EMS arrived, her SPO2 was in the 50s hence she was placed on CPAP, given nebs   1.Acute on chronichypoxemic respiratory failure multifactorial combination of COPD and combined diastolic systolic heart failure,Pulmonary hypertension and possible underlying sleep apnea -treat as outlined below  2.Acute Diastolic and systolic heart failure exacerbation -we'll resume patient's Coreg and torsemide  3.Acute COPD exacerbation -IV steroids,  inhalers, nebulizer- -pulmonary fields patient will benefit from LTAC  4. PresumedHealthcare associated pneumonia -empiric IV cefepime -Blood cultures negative  5.History of type 2 diabetes -ssi -Start lantus 20 units sq daily -patient's metformin held secondary to elevated creatinine  6. Hypertension -now on Zebeta and hydralazine  7. Anxiety/panic -When necessary Xanax  Left message for patient's niece [name] [phone]  Case discussed with Care Management/Social Worker. Management plans discussed with the patient, family and they are in agreement.  CODE STATUS:DNR DVT Prophylaxis:  lovenox  TOTAL TIME TAKING CARE OF THIS PATIENT: *30* minutes.  >50% time spent on counselling and coordination of care  POSSIBLE D/C IN *1-2* DAYS, DEPENDING ON CLINICAL CONDITION.  Note: This dictation was prepared with Dragon dictation along with smaller phrase technology. Any transcriptional errors that result from this process are unintentional.  Salwa Bai M.D on 06/29/2017 at 3:11 PM  Between 7am to 6pm - Pager - [phone]  After 6pm go to [email or web address] - Proofreader  [facility]  Office  [phone]  CC: Primary care physician; Cletis Athens, MDPatient ID: [name], female   DOB: Mar 21, 1941, 76 y.o.   MRN: [id number]

## 2017-06-30 DIAGNOSIS — G4733 Obstructive sleep apnea (adult) (pediatric): Secondary | ICD-10-CM | POA: Diagnosis not present

## 2017-06-30 LAB — GLUCOSE, CAPILLARY
GLUCOSE-CAPILLARY: 211 mg/dL — AB (ref 65–99)
GLUCOSE-CAPILLARY: 263 mg/dL — AB (ref 65–99)
Glucose-Capillary: 119 mg/dL — ABNORMAL HIGH (ref 65–99)
Glucose-Capillary: 196 mg/dL — ABNORMAL HIGH (ref 65–99)

## 2017-06-30 LAB — BASIC METABOLIC PANEL
ANION GAP: 9 (ref 5–15)
BUN: 51 mg/dL — ABNORMAL HIGH (ref 6–20)
CHLORIDE: 105 mmol/L (ref 101–111)
CO2: 29 mmol/L (ref 22–32)
Calcium: 8.7 mg/dL — ABNORMAL LOW (ref 8.9–10.3)
Creatinine, Ser: 1.58 mg/dL — ABNORMAL HIGH (ref 0.44–1.00)
GFR calc non Af Amer: 31 mL/min — ABNORMAL LOW (ref >60)
GFR, EST AFRICAN AMERICAN: 36 mL/min — AB (ref >60)
Glucose, Bld: 145 mg/dL — ABNORMAL HIGH (ref 65–99)
POTASSIUM: 4.6 mmol/L (ref 3.5–5.1)
Sodium: 143 mmol/L (ref 135–145)

## 2017-06-30 MED ORDER — ORAL CARE MOUTH RINSE
15.0000 mL | Freq: Two times a day (BID) | OROMUCOSAL | Status: DC
  Administered 2017-06-30 – 2017-07-04 (×3): 15 mL via OROMUCOSAL

## 2017-06-30 NOTE — Progress Notes (Signed)
[place] at [place] NAME: Chasady Longwell    MR#:  [id number]  DATE OF BIRTH:  03/23/1941  SUBJECTIVE:   Patient admitted with increasing shortness of breath.  Patient is off BiPAP. Currently on nasal cannula oxygen. Sats 100% Gets anxious easily.Patient's sats are 97% on 2 L. Patient feels a lot better today. REVIEW OF SYSTEMS:   Review of Systems  Constitutional: Negative for chills, fever and weight loss.  HENT: Negative for ear discharge, ear pain and nosebleeds.   Eyes: Negative for blurred vision, pain and discharge.  Respiratory: Positive for shortness of breath. Negative for sputum production, wheezing and stridor.   Cardiovascular: Negative for chest pain, palpitations, orthopnea and PND.  Gastrointestinal: Negative for abdominal pain, diarrhea, nausea and vomiting.  Genitourinary: Negative for frequency and urgency.  Musculoskeletal: Negative for back pain and joint pain.  Neurological: Positive for weakness. Negative for sensory change, speech change and focal weakness.  Psychiatric/Behavioral: Negative for depression and hallucinations. The patient is not nervous/anxious.    Tolerating Diet:yes Tolerating PT: NO PT f/u  DRUG ALLERGIES:   Allergies  Allergen Reactions  . Ace Inhibitors Anaphylaxis    Tongue swelling  . Shellfish Allergy Hives and Swelling    Swelling around face and mouth     VITALS:  Blood pressure (!) 146/76, pulse 87, temperature 97.8 F (36.6 C), temperature source Oral, resp. rate (!) 22, height 4\' 11"  (1.499 m), weight 84 kg (185 lb 3 oz), SpO2 94 %.  PHYSICAL EXAMINATION:   Physical Exam  GENERAL:  76 y.o.-year-old patient lying in the bed with no acute distress. obese EYES: Pupils equal, round, reactive to light and accommodation. No scleral icterus. Extraocular muscles intact.  HEENT: Head atraumatic, normocephalic. Oropharynx and nasopharynx clear.  NECK:  Supple, no jugular venous  distention. No thyroid enlargement, no tenderness.  LUNGS: distant breath sounds bilaterally, no wheezing, rales, rhonchi. No use of accessory muscles of respiration.  CARDIOVASCULAR: S1, S2 normal. No murmurs, rubs, or gallops.  ABDOMEN: Soft, nontender, nondistended. Bowel sounds present. No organomegaly or mass.  EXTREMITIES: No cyanosis, clubbing or edema b/l.    NEUROLOGIC: Cranial nerves II through XII are intact. No focal Motor or sensory deficits b/l.   PSYCHIATRIC:  patient is alert and oriented x 3.  SKIN: No obvious rash, lesion, or ulcer.   LABORATORY PANEL:  CBC  Recent Labs Lab 06/27/17 0235  WBC 17.0*  HGB 10.8*  HCT 33.4*  PLT 296    Chemistries   Recent Labs Lab 06/27/17 0235  06/29/17 0351 06/30/17 0402  NA 144  < > 142 143  K 4.8  < > 4.7 4.6  CL 106  < > 106 105  CO2 29  < > 28 29  GLUCOSE 245*  < > 94 145*  BUN 38*  < > 59* 51*  CREATININE 1.98*  < > 2.10* 1.58*  CALCIUM 8.5*  < > 8.2* 8.7*  MG  --   < > 2.6*  --   AST 82*  --   --   --   ALT 57*  --   --   --   ALKPHOS 74  --   --   --   BILITOT 0.4  --   --   --   < > = values in this interval not displayed. Cardiac Enzymes  Recent Labs Lab 06/27/17 2041  TROPONINI 0.05*   RADIOLOGY:  No results found. ASSESSMENT AND  PLAN:  76 y/o female with PMH of combined systolic and diastolic heart failure, CAD, hyperlipidemia, HTN, COPDon home O2 who was recently discharged on 9/20/2018after being treated for respiratory failure and nosocomial pneumonia. Patient presented to the emergency room with complaints of SOB. When EMS arrived, her SPO2 was in the 50s hence she was placed on CPAP, given nebs   1.Acute on chronichypoxemic respiratory failure multifactorial combination of COPD and combined diastolic systolic heart failure,Pulmonary hypertension and possible underlying sleep apnea -treat as outlined below  2.Acute Diastolic and systolic heart failure exacerbation -we'll resume patient's  Coreg and torsemide  3.Acute COPD exacerbation -IV steroids, inhalers, nebulizer -pulmonary feels patient will benefit from LTAC---CM working on it  4. PresumedHealthcare associated pneumonia -empiric IV cefepime -Blood cultures negative  5.History of type 2 diabetes -ssi - lantus 20 units sq daily -patient's metformin held secondary to elevated creatinine  6. Hypertension -now on Zebeta and hydralazine  7. Anxiety/panic -When necessary Xanax  Left message for patient's niece [name] [phone]  Case discussed with Care Management/Social Worker. Management plans discussed with the patient, family and they are in agreement.  CODE STATUS:DNR DVT Prophylaxis:  lovenox  TOTAL TIME TAKING CARE OF THIS PATIENT: *30* minutes.  >50% time spent on counselling and coordination of care  POSSIBLE D/C IN *1-2* DAYS, DEPENDING ON CLINICAL CONDITION.  Note: This dictation was prepared with Dragon dictation along with smaller phrase technology. Any transcriptional errors that result from this process are unintentional.  Jovee Dettinger M.D on 06/30/2017 at 1:40 PM  Between 7am to 6pm - Pager - [phone]  After 6pm go to [email or web address] - Proofreader  [facility]  Office  [phone]  CC: Primary care physician; Cletis Athens, MDPatient ID: [name], female   DOB: 1941-07-30, 76 y.o.   MRN: [id number]

## 2017-06-30 NOTE — Care Management (Addendum)
Message sent to loury with [facility] to check status of insurance coverage.  [facility] will be sending pre-cert today to insurance per Loury. Patient and niece updated.

## 2017-06-30 NOTE — Evaluation (Signed)
Physical Therapy Evaluation Patient Details Name: Angela Pham MRN: [id number] DOB: [birth year] Today's Date: 06/30/2017   History of Present Illness  Pt is a 76 y.o. female recently admitted 06/11/17-06/16/17 for COPD exacerbation and nosocomial pneumonia. Pt presented to ED on 06/27/17 for increased SOB and low oxygen saturation levels secondary to pneumonia, COPD exacerbation and acute on chronic systolic heart failure. Pt PMH includes asthma, COPD, chronic systolic and diastolic CHF, CAD, hyperlipidemia, HTN, and diabetes mellitus.  Clinical Impression  Prior to hospital admission, pt was independent with ADLs and able to ambulate household distances and within the community without an AD.  Pt lives with niece, but is alone during the day while the family is working.  Currently pt is modified independent with bed mobility and transfers; standby assist for gait for O2 follow. Pt able to ambulate 33ft without AD; mild SOB noted throughout session. O2 saturation monitored throughout session; 96% beginning of session, 94% with gait, 95% end of session; all on 2L O2 via nasal cannula. All other vitals remained stable. Upon hospital discharge, PT recommends pt may benefit from outpatient pulmonary rehab.     Follow Up Recommendations No PT follow up    Equipment Recommendations       Recommendations for Other Services       Precautions / Restrictions Restrictions Weight Bearing Restrictions: No      Mobility  Bed Mobility Overal bed mobility: Modified Independent Bed Mobility: Supine to Sit     Supine to sit: Modified independent (Device/Increase time)     General bed mobility comments: able to move from supine to sit on EOB independently with increased time  Transfers Overall transfer level: Modified independent Equipment used: None Transfers: Sit to/from Stand Sit to Stand: Modified independent (Device/Increase time)         General transfer comment: Good LE strength; minimal  use of UEs needed to complete sit to stand; mild SOB with standing  Ambulation/Gait Ambulation/Gait assistance: Supervision Ambulation Distance (Feet): 30 Feet Assistive device: None Gait Pattern/deviations: WFL(Within Functional Limits)     General Gait Details: Decreased gait speed, decreased stride length, lack of heel strike pattern; steady while walking; limited trunk rotation and arm swing; standby assist for O2 follow; mild SOB with ambulation  Stairs            Wheelchair Mobility    Modified Rankin (Stroke Patients Only)       Balance Overall balance assessment: Needs assistance Sitting-balance support: No upper extremity supported;Feet supported Sitting balance-Leahy Scale: Good Sitting balance - Comments: able to sit on EOB without feet supported; feet supported and UE elevation without LOB   Standing balance support: No upper extremity supported Standing balance-Leahy Scale: Good Standing balance comment: Able to maintain static standing balance without UE support                             Pertinent Vitals/Pain Pain Assessment: No/denies pain    Home Living Family/patient expects to be discharged to:: Private residence Living Arrangements: Other relatives Available Help at Discharge: Family (Lives with niece, pt is alone during the day while family works) Type of Home: House Home Access: Stairs to enter Entrance Stairs-Rails: Can reach Optician, dispensing of Steps: 4 Home Layout: One level Home Equipment: Grab bars - tub/shower      Prior Function Level of Independence: Independent         Comments: Indep for ADLs; no AD for household  and community ambulation; does not drive     Hand Dominance   Dominant Hand: Left    Extremity/Trunk Assessment   Upper Extremity Assessment Upper Extremity Assessment: Overall WFL for tasks assessed    Lower Extremity Assessment Lower Extremity Assessment: Overall WFL  for tasks assessed (at least 4/5 bilateral LE)    Cervical / Trunk Assessment Cervical / Trunk Assessment: Normal  Communication   Communication: No difficulties  Cognition Arousal/Alertness: Awake/alert Behavior During Therapy: WFL for tasks assessed/performed Overall Cognitive Status: Within Functional Limits for tasks assessed                                        General Comments      Exercises     Assessment/Plan    PT Assessment Patient needs continued PT services  PT Problem List Decreased activity tolerance;Cardiopulmonary status limiting activity       PT Treatment Interventions Gait training;Stair training;Therapeutic activities;Therapeutic exercise;Patient/family education    PT Goals (Current goals can be found in the Care Plan section)  Acute Rehab PT Goals Patient Stated Goal: to be able to go home PT Goal Formulation: With patient Time For Goal Achievement: 07/14/17 Potential to Achieve Goals: Good    Frequency Min 2X/week   Barriers to discharge        Co-evaluation               AM-PAC PT "6 Clicks" Daily Activity  Outcome Measure Difficulty turning over in bed (including adjusting bedclothes, sheets and blankets)?: None Difficulty moving from lying on back to sitting on the side of the bed? : A Little Difficulty sitting down on and standing up from a chair with arms (e.g., wheelchair, bedside commode, etc,.)?: A Little Help needed moving to and from a bed to chair (including a wheelchair)?: A Little Help needed walking in hospital room?: A Little Help needed climbing 3-5 steps with a railing? : A Little 6 Click Score: 19    End of Session Equipment Utilized During Treatment: Gait belt;Oxygen Activity Tolerance: Patient tolerated treatment well Patient left: in chair;with call bell/phone within reach Nurse Communication: Mobility status PT Visit Diagnosis: Muscle weakness (generalized) (M62.81)    Time: 6384-5364 PT  Time Calculation (min) (ACUTE ONLY): 18 min   Charges:         PT G CodesWetzel Bjornstad, SPT 06/30/2017, 12:40 PM

## 2017-06-30 NOTE — Consult Note (Signed)
PULMONARY / CRITICAL CARE MEDICINE   Name: Angela Pham MRN: [id number] DOB: [birth year]    ADMISSION DATE:  06/27/2017   CONSULTATION DATE:  06/27/2017  REFERRING MD:  Dr. Estanislado Pandy  REASON: Acute respiratory failure  CHIEF COMPLAINT:  dyspnea  HISTORY OF PRESENT ILLNESS:   On and off biPAP yesterday Still wheezing, but much improved Still feels SOB Prognosis is poor Patient is DNR    REVIEW OF SYSTEMS:    Respiratory: positive  for shortness of breath, cough with green sputum and wheezing.  All other systems negative   VITAL SIGNS: BP 131/72   Pulse 82   Temp 97.7 F (36.5 C) (Oral)   Resp 16   Ht 4\' 11"  (1.499 m)   Wt 185 lb 3 oz (84 kg)   SpO2 98%   BMI 37.40 kg/m   INTAKE / OUTPUT: I/O last 3 completed shifts: In: 290 [P.O.:240; IV Piggyback:50] Out: 300 [Urine:300]   PHYSICAL EXAMINATION: General: mild shortness of breath, lying in bed Neuro:  AAO X4, no focal deficits HEENT:  PERRLA, mild JVD, trachea midline Cardiovascular: RRR, S1/S2, no MRG, +2 pulses, +1 edema Lungs:  Increased WOB, Bilateral breath sounds, no wheezing Abdomen:  Obese, non-distended, normal bowel sounds Musculoskeletal:  +rom Skin:  Warm and dry    LABS:  BMET  Recent Labs Lab 06/28/17 0346 06/29/17 0351 06/30/17 0402  NA 138 142 143  K 4.6 4.7 4.6  CL 103 106 105  CO2 24 28 29   BUN 50* 59* 51*  CREATININE 1.90* 2.10* 1.58*  GLUCOSE 201* 94 145*    Electrolytes  Recent Labs Lab 06/28/17 0346 06/29/17 0351 06/30/17 0402  CALCIUM 8.1* 8.2* 8.7*  MG 2.4 2.6*  --   PHOS 3.2 3.4  --     CBC  Recent Labs Lab 06/27/17 0235  WBC 17.0*  HGB 10.8*  HCT 33.4*  PLT 296    Coag's No results for input(s): APTT, INR in the last 168 hours.  Sepsis Markers  Recent Labs Lab 06/27/17 0615 06/28/17 0346 06/29/17 0351  LATICACIDVEN 1.4  --   --   PROCALCITON 0.47 1.55 1.14    ABG No results for input(s): PHART, PCO2ART, PO2ART in the last 168  hours.  Liver Enzymes  Recent Labs Lab 06/27/17 0235  AST 82*  ALT 57*  ALKPHOS 74  BILITOT 0.4  ALBUMIN 2.9*    Cardiac Enzymes  Recent Labs Lab 06/27/17 0847 06/27/17 1431 06/27/17 2041  TROPONINI 0.07* 0.06* 0.05*    Glucose  Recent Labs Lab 06/29/17 0714 06/29/17 1000 06/29/17 1148 06/29/17 1647 06/29/17 2200 06/30/17 0708  GLUCAP 68 115* 159* 158* 163* 119*    Imaging No results found. STUDIES:  ECHO bubble study on 9/19  Left ventricle: Systolic function was moderately reduced. The   estimated ejection fraction was in the range of 35% to 40%. - Aortic valve: There was mild regurgitation. - Mitral valve: There was moderate regurgitation. - Left atrium: The atrium was mildly dilated. - Atrial septum: Echo contrast study showed no right-to-left atrial   level shunt, at baseline or with provocation.    ANTIBIOTICS: Anti-infectives    Start     Dose/Rate Route Frequency Ordered Stop   06/28/17 1600  ceFEPIme (MAXIPIME) 1 g in dextrose 5 % 50 mL IVPB     1 g 100 mL/hr over 30 Minutes Intravenous Every 24 hours 06/28/17 1112     06/27/17 1600  piperacillin-tazobactam (ZOSYN) IVPB 3.375 g  Status:  Discontinued     3.375 g 12.5 mL/hr over 240 Minutes Intravenous Every 8 hours 06/27/17 1452 06/28/17 1107   06/27/17 1200  vancomycin (VANCOCIN) IVPB 750 mg/150 ml premix  Status:  Discontinued     750 mg 150 mL/hr over 60 Minutes Intravenous Every 24 hours 06/27/17 0640 06/27/17 1127   06/27/17 0415  vancomycin (VANCOCIN) IVPB 1000 mg/200 mL premix     1,000 mg 200 mL/hr over 60 Minutes Intravenous  Once 06/27/17 0408 06/27/17 0700   06/27/17 0415  piperacillin-tazobactam (ZOSYN) IVPB 3.375 g  Status:  Discontinued     3.375 g 12.5 mL/hr over 240 Minutes Intravenous Every 12 hours 06/27/17 0408 06/27/17 1452   06/27/17 0330  azithromycin (ZITHROMAX) 500 mg in dextrose 5 % 250 mL IVPB     500 mg 250 mL/hr over 60 Minutes Intravenous  Once 06/27/17 0324  06/27/17 0434       SIGNIFICANT EVENTS: 09/20>discharged 10/01>RE-ADMITTED  LINES/TUBES: PIVs    DISCUSSION: 76 y/o female with recurrent hospitalizations for acute hypoxic/hypercarbic respiratory failure, acute diastolic/systolic heart failure and acute COPD exacerbation/HCAP   PLAN  BiPAP as needed and titrated to [place] as tolerated Antibiotics as above. Follow-up cultures Nebulized bronchodilators and steroids. Nitroglycerin topical 15mg  Q6H Continue all home medications DVT prophylaxis   Patient has chronic end-stage lung disease and she is symptomatic with extreme shortness of breath with movement she has recurrent hospitalizations over the last several months it is related to her chronic respiratory failure and chronic COPD and heart failure and she will likely benefit from long-term acute care facility  Patient are satisfied with Plan of action and management. All questions answered       Corrin Parker, M.D.  [facility]  Medical Director [place] Director [facility] Cardio-Pulmonary Department

## 2017-07-01 ENCOUNTER — Telehealth: Payer: Self-pay | Admitting: *Deleted

## 2017-07-01 LAB — GLUCOSE, CAPILLARY
GLUCOSE-CAPILLARY: 185 mg/dL — AB (ref 65–99)
GLUCOSE-CAPILLARY: 272 mg/dL — AB (ref 65–99)
Glucose-Capillary: 143 mg/dL — ABNORMAL HIGH (ref 65–99)
Glucose-Capillary: 223 mg/dL — ABNORMAL HIGH (ref 65–99)

## 2017-07-01 MED ORDER — CEFUROXIME AXETIL 500 MG PO TABS
500.0000 mg | ORAL_TABLET | Freq: Two times a day (BID) | ORAL | Status: DC
  Administered 2017-07-02: 500 mg via ORAL
  Filled 2017-07-01 (×2): qty 1

## 2017-07-01 MED ORDER — CEFUROXIME AXETIL 500 MG PO TABS
500.0000 mg | ORAL_TABLET | Freq: Two times a day (BID) | ORAL | Status: DC
  Filled 2017-07-01: qty 1

## 2017-07-01 MED ORDER — METHYLPREDNISOLONE SODIUM SUCC 125 MG IJ SOLR
60.0000 mg | Freq: Four times a day (QID) | INTRAMUSCULAR | Status: DC
  Administered 2017-07-01 (×2): 60 mg via INTRAVENOUS
  Filled 2017-07-01 (×2): qty 2

## 2017-07-01 MED ORDER — METHYLPREDNISOLONE SODIUM SUCC 125 MG IJ SOLR
60.0000 mg | Freq: Two times a day (BID) | INTRAMUSCULAR | Status: DC
  Administered 2017-07-02 – 2017-07-04 (×5): 60 mg via INTRAVENOUS
  Filled 2017-07-01 (×5): qty 2

## 2017-07-01 NOTE — Telephone Encounter (Signed)
-----   Message from Laverle Hobby, MD sent at 06/30/2017  8:23 PM EDT ----- Regarding: sleep study Recommend: Sleep study with CPAP titration

## 2017-07-01 NOTE — Care Management (Signed)
Message left for Angela Pham with [facility] to see if there have been any updates. Per Fabienne Bruns- nothing yet- still pending.

## 2017-07-01 NOTE — Progress Notes (Signed)
[facility] Progess Note    SYNOPSIS   76 y/o female with recurrent hospitalizations for acute hypoxic/hypercarbic respiratory failure, acute diastolic/systolic heart failure and acute COPD exacerbation/HCAP   ASSESSMENT/PLAN    Patient has chronic end-stage lung disease recurrent hospitalizations over the last several months it is related to her chronic respiratory failure and chronic COPD and heart failure and she will likely benefit from long-term acute care facility.  Patient is presently on cefepime, Solu-Medrol, long-acting anticholinergic inhaler, short acting beta agonist, Will increase Solu-Medrol as patient is still actively symptomatic and bronchospastic.  Cardiomyopathy. Patient has a moderately reduced ejection fraction of 35-40%. Also noted to have valvular heart disease to include mitral regurgitation and moderate range and aortic insufficiency in the mild range.  Renal insufficiency. Improvement in creatinine from 2.1-1.58.  Hyperglycemia. On coverage  Name: Angela Pham MRN: [id number] DOB: [birth year]    ADMISSION DATE:  06/27/2017  SUBJECTIVE:   Patient states that her breathing is a little bit better today.   VITAL SIGNS: Temp:  [97.7 F (36.5 C)-98.1 F (36.7 C)] 98.1 F (36.7 C) (10/05 0800) Pulse Rate:  [69-108] 81 (10/05 0800) Resp:  [15-38] 28 (10/05 0800) BP: (113-147)/(60-88) 123/70 (10/05 0230) SpO2:  [89 %-100 %] 99 % (10/05 0800)   PHYSICAL EXAMINATION: Physical Examination:   VS: BP 123/70 (BP Location: Right Arm)   Pulse 81   Temp 98.1 F (36.7 C) (Oral)   Resp (!) 28   Ht 4\' 11"  (1.499 m)   Wt 84 kg (185 lb 3 oz)   SpO2 99%   BMI 37.40 kg/m   General Appearance: No distress  Neuro:without focal findings, mental status normal. HEENT: PERRLA, EOM intact. Pulmonary: normal breath sounds   CardiovascularNormal S1,S2.  No m/r/g.   Abdomen: Benign, Soft, non-tender. Renal:  No costovertebral tenderness  GU:   Not performed at this time. Endocrine: No evident thyromegaly. Skin:   warm, no rashes, no ecchymosis  Extremities: normal, no cyanosis, clubbing.    LABORATORY PANEL:   CBC  Recent Labs Lab 06/27/17 0235  WBC 17.0*  HGB 10.8*  HCT 33.4*  PLT 296    Chemistries   Recent Labs Lab 06/27/17 0235  06/29/17 0351 06/30/17 0402  NA 144  < > 142 143  K 4.8  < > 4.7 4.6  CL 106  < > 106 105  CO2 29  < > 28 29  GLUCOSE 245*  < > 94 145*  BUN 38*  < > 59* 51*  CREATININE 1.98*  < > 2.10* 1.58*  CALCIUM 8.5*  < > 8.2* 8.7*  MG  --   < > 2.6*  --   PHOS  --   < > 3.4  --   AST 82*  --   --   --   ALT 57*  --   --   --   ALKPHOS 74  --   --   --   BILITOT 0.4  --   --   --   < > = values in this interval not displayed.   Recent Labs Lab 06/29/17 2200 06/30/17 0708 06/30/17 1127 06/30/17 1613 06/30/17 2131 07/01/17 0730  GLUCAP 163* 119* 211* 263* 196* 143*   No results for input(s): PHART, PCO2ART, PO2ART in the last 168 hours.  Recent Labs Lab 06/27/17 0235  AST 82*  ALT 57*  ALKPHOS 74  BILITOT 0.4  ALBUMIN 2.9*    Cardiac Enzymes  Recent Labs Lab  06/27/17 2041  TROPONINI 0.05*    RADIOLOGY:  No results found.   Hermelinda Dellen, DO   07/01/2017

## 2017-07-01 NOTE — Progress Notes (Signed)
[place] [facility] [place] NAME: Angela Pham    MR#:  [id number]  DATE OF BIRTH:  06-06-1941  SUBJECTIVE:   Patient looks and feels a lot better today. Sats are 97% on 2 L. Able to complete full sentences without getting short of breath REVIEW OF SYSTEMS:   Review of Systems  Constitutional: Negative for chills, fever and weight loss.  HENT: Negative for ear discharge, ear pain and nosebleeds.   Eyes: Negative for blurred vision, pain and discharge.  Respiratory: Positive for shortness of breath. Negative for sputum production, wheezing and stridor.   Cardiovascular: Negative for chest pain, palpitations, orthopnea and PND.  Gastrointestinal: Negative for abdominal pain, diarrhea, nausea and vomiting.  Genitourinary: Negative for frequency and urgency.  Musculoskeletal: Negative for back pain and joint pain.  Neurological: Positive for weakness. Negative for sensory change, speech change and focal weakness.  Psychiatric/Behavioral: Negative for depression and hallucinations. The patient is not nervous/anxious.    Tolerating Diet:yes Tolerating PT: NO PT f/u  DRUG ALLERGIES:   Allergies  Allergen Reactions  . Ace Inhibitors Anaphylaxis    Tongue swelling  . Shellfish Allergy Hives and Swelling    Swelling around face and mouth     VITALS:  Blood pressure 140/73, pulse 94, temperature 98.1 F (36.7 C), temperature source Oral, resp. rate 18, height 4\' 11"  (1.499 m), weight 84 kg (185 lb 3 oz), SpO2 95 %.  PHYSICAL EXAMINATION:   Physical Exam  GENERAL:  76 y.o.-year-old patient lying in the bed with no acute distress. obese EYES: Pupils equal, round, reactive to light and accommodation. No scleral icterus. Extraocular muscles intact.  HEENT: Head atraumatic, normocephalic. Oropharynx and nasopharynx clear.  NECK:  Supple, no jugular venous distention. No thyroid enlargement, no tenderness.  LUNGS: distant breath sounds  bilaterally, no wheezing, rales, rhonchi. No use of accessory muscles of respiration.  CARDIOVASCULAR: S1, S2 normal. No murmurs, rubs, or gallops.  ABDOMEN: Soft, nontender, nondistended. Bowel sounds present. No organomegaly or mass.  EXTREMITIES: No cyanosis, clubbing or edema b/l.    NEUROLOGIC: Cranial nerves II through XII are intact. No focal Motor or sensory deficits b/l.   PSYCHIATRIC:  patient is alert and oriented x 3.  SKIN: No obvious rash, lesion, or ulcer.   LABORATORY PANEL:  CBC  Recent Labs Lab 06/27/17 0235  WBC 17.0*  HGB 10.8*  HCT 33.4*  PLT 296    Chemistries   Recent Labs Lab 06/27/17 0235  06/29/17 0351 06/30/17 0402  NA 144  < > 142 143  K 4.8  < > 4.7 4.6  CL 106  < > 106 105  CO2 29  < > 28 29  GLUCOSE 245*  < > 94 145*  BUN 38*  < > 59* 51*  CREATININE 1.98*  < > 2.10* 1.58*  CALCIUM 8.5*  < > 8.2* 8.7*  MG  --   < > 2.6*  --   AST 82*  --   --   --   ALT 57*  --   --   --   ALKPHOS 74  --   --   --   BILITOT 0.4  --   --   --   < > = values in this interval not displayed. Cardiac Enzymes  Recent Labs Lab 06/27/17 2041  TROPONINI 0.05*   RADIOLOGY:  No results found. ASSESSMENT AND PLAN:  76 y/o female with PMH of combined systolic and diastolic  heart failure, CAD, hyperlipidemia, HTN, COPDon home O2 who was recently discharged on 9/20/2018after being treated for respiratory failure and nosocomial pneumonia. Patient presented to the emergency room with complaints of SOB. When EMS arrived, her SPO2 was in the 50s hence she was placed on CPAP, given nebs   1.Acute on chronichypoxemic respiratory failure multifactorial combination of COPD and combined diastolic systolic heart failure,Pulmonary hypertension and possible underlying sleep apnea -treat as outlined below  2.Acute Diastolic and systolic heart failure exacerbation -we'll resume patient's Coreg and torsemide  3.Acute COPD exacerbation -IV steroids, inhalers,  nebulizer -pulmonary feels patient will benefit from LTAC---CM working on it. Still no response from insurance company. I have spoke with patient if she continues to improve when she will be discharged tomorrow home. Patient voiced understanding -taper off steroids as out pt. Spoke with dr conforti  4. PresumedHealthcare associated pneumonia -empiric IV cefepime--- po ceftin -Blood cultures negative  5.History of type 2 diabetes -ssi - lantus 10 units sq daily -patient's metformin held secondary to elevated creatinine  6. Hypertension -now on Zebeta and hydralazine  7. Anxiety/panic -When necessary Xanax  Left message for patient's niece [name] [phone]  Case discussed with Care Management/Social Worker. Management plans discussed with the patient, family and they are in agreement.  CODE STATUS:DNR DVT Prophylaxis:  lovenox  TOTAL TIME TAKING CARE OF THIS PATIENT: *30* minutes.  >50% time spent on counselling and coordination of care  POSSIBLE D/C IN *1-2* DAYS, DEPENDING ON CLINICAL CONDITION.  Note: This dictation was prepared with Dragon dictation along with smaller phrase technology. Any transcriptional errors that result from this process are unintentional.  Syria Kestner M.D on 07/01/2017 at 3:37 PM  Between 7am to 6pm - Pager - [phone]  After 6pm go to [email or web address] - Proofreader  [facility]  Office  [phone]  CC: Primary care physician; Cletis Athens, MDPatient ID: Angela Pham, female   DOB: 1940-11-06, 76 y.o.   MRN: [id number]

## 2017-07-01 NOTE — Progress Notes (Signed)
[place] for electrolyte monitoring   Pharmacy consulted for electrolyte monitoring for 76 yo female admitted with respiratory failure. Patient is ordered torsemide 20mg  PO daily.   Plan:  Electrolytes last checked 10/4. Will recheck electrolytes with am labs.   Allergies  Allergen Reactions  . Ace Inhibitors Anaphylaxis    Tongue swelling  . Shellfish Allergy Hives and Swelling    Swelling around face and mouth     Patient Measurements: Height: 4\' 11"  (149.9 cm) Weight: 185 lb 3 oz (84 kg) IBW/kg (Calculated) : 43.2  Vital Signs: Temp: 98.1 F (36.7 C) (10/05 1200) Temp Source: Oral (10/05 1200) BP: 140/73 (10/05 1021) Pulse Rate: 94 (10/05 1200) Intake/Output from previous day: 10/04 0701 - 10/05 0700 In: 987 [P.O.:937; IV Piggyback:50] Out: 400 [Urine:400] Intake/Output from this shift: No intake/output data recorded.  Labs:  Recent Labs  06/29/17 0351 06/30/17 0402  CREATININE 2.10* 1.58*  MG 2.6*  --   PHOS 3.4  --    Estimated Creatinine Clearance: 29.3 mL/min (A) (by C-G formula based on SCr of 1.58 mg/dL (H)).  Pharmacy will continue to monitor and adjust per consult.   Simpson,Michael L 07/01/2017,3:53 PM

## 2017-07-01 NOTE — Telephone Encounter (Signed)
Pt is currently inpatient at [facility] since 06/27/17.

## 2017-07-01 NOTE — Progress Notes (Signed)
Refused bed bath stated she had one last night but there's no documentation for it.

## 2017-07-01 NOTE — Progress Notes (Signed)
Pnt has rested no issues or concerns overnight. Pnt reported she would let nursing know if she wanted BIPAP. Pnt has not requested Bipap at this time in shift. [place] o2 at 2L sats have remained 98-100%. Pnt appears comfortable and resting will continue to monitor and assess.

## 2017-07-01 NOTE — Progress Notes (Signed)
Per Dr. Jefferson Fuel, okay for patient to be transferred out of ICU to med surg, no telemetry. Order placed. Wilnette Kales

## 2017-07-01 NOTE — Progress Notes (Signed)
Pharmacy Antibiotic Note  Angela Pham is a 76 y.o. female admitted on 06/27/2017 with pneumonia.  Pharmacy has been consulted for cefepime dosing.   MRSA PCR negative 9/16   Plan: Continue cefepime 1g IV Q24hr.    Height: 4\' 11"  (149.9 cm) Weight: 185 lb 3 oz (84 kg) IBW/kg (Calculated) : 43.2  Temp (24hrs), Avg:98 F (36.7 C), Min:97.7 F (36.5 C), Max:98.1 F (36.7 C)   Recent Labs Lab 06/27/17 0235 06/27/17 0615 06/28/17 0346 06/29/17 0351 06/30/17 0402  WBC 17.0*  --   --   --   --   CREATININE 1.98*  --  1.90* 2.10* 1.58*  LATICACIDVEN  --  1.4  --   --   --     Estimated Creatinine Clearance: 29.3 mL/min (A) (by C-G formula based on SCr of 1.58 mg/dL (H)).    Allergies  Allergen Reactions  . Ace Inhibitors Anaphylaxis    Tongue swelling  . Shellfish Allergy Hives and Swelling    Swelling around face and mouth    Antimicrobials: Azithromycin 10/1 x 1 Vancomycin 10/1 x 1 Zosyn 10/1 >> 10/2 Cefepime 10/2 >>   Micro: 9/16 MRSA PCR: negative  10/1 BCx: no growth x 4 days  Thank you for allowing pharmacy to be a part of this patient's care.  MLS  07/01/2017

## 2017-07-02 DIAGNOSIS — Z66 Do not resuscitate: Secondary | ICD-10-CM | POA: Diagnosis present

## 2017-07-02 LAB — BASIC METABOLIC PANEL
ANION GAP: 10 (ref 5–15)
BUN: 65 mg/dL — ABNORMAL HIGH (ref 6–20)
CALCIUM: 8.8 mg/dL — AB (ref 8.9–10.3)
CO2: 27 mmol/L (ref 22–32)
CREATININE: 1.6 mg/dL — AB (ref 0.44–1.00)
Chloride: 107 mmol/L (ref 101–111)
GFR calc non Af Amer: 31 mL/min — ABNORMAL LOW (ref >60)
GFR, EST AFRICAN AMERICAN: 36 mL/min — AB (ref >60)
Glucose, Bld: 197 mg/dL — ABNORMAL HIGH (ref 65–99)
Potassium: 4.5 mmol/L (ref 3.5–5.1)
Sodium: 144 mmol/L (ref 135–145)

## 2017-07-02 LAB — CULTURE, BLOOD (ROUTINE X 2)
Culture: NO GROWTH
Culture: NO GROWTH
Special Requests: ADEQUATE
Special Requests: ADEQUATE

## 2017-07-02 LAB — GLUCOSE, CAPILLARY
GLUCOSE-CAPILLARY: 332 mg/dL — AB (ref 65–99)
Glucose-Capillary: 183 mg/dL — ABNORMAL HIGH (ref 65–99)
Glucose-Capillary: 252 mg/dL — ABNORMAL HIGH (ref 65–99)
Glucose-Capillary: 364 mg/dL — ABNORMAL HIGH (ref 65–99)

## 2017-07-02 MED ORDER — CEFUROXIME AXETIL 500 MG PO TABS
500.0000 mg | ORAL_TABLET | Freq: Every day | ORAL | Status: DC
  Administered 2017-07-03 – 2017-07-04 (×2): 500 mg via ORAL
  Filled 2017-07-02 (×2): qty 1

## 2017-07-02 NOTE — Progress Notes (Signed)
[facility] [place] NAME: Angela Pham    MR#:  [id number]  DATE OF BIRTH:  06/28/1941  SUBJECTIVE:   Continues to have shortness of breath, wheezing. BiPAP overnight. Yellow sputum. She has had recurrent admissions over the last 4 months. History of asthma. No history of smoking. Does have diagnosis of COPD.  REVIEW OF SYSTEMS:   Review of Systems  Constitutional: Negative for chills, fever and weight loss.  HENT: Negative for ear discharge, ear pain and nosebleeds.   Eyes: Negative for blurred vision, pain and discharge.  Respiratory: Positive for shortness of breath. Negative for sputum production, wheezing and stridor.   Cardiovascular: Negative for chest pain, palpitations, orthopnea and PND.  Gastrointestinal: Negative for abdominal pain, diarrhea, nausea and vomiting.  Genitourinary: Negative for frequency and urgency.  Musculoskeletal: Negative for back pain and joint pain.  Neurological: Positive for weakness. Negative for sensory change, speech change and focal weakness.  Psychiatric/Behavioral: Negative for depression and hallucinations. The patient is not nervous/anxious.    Tolerating Diet:yes  DRUG ALLERGIES:   Allergies  Allergen Reactions  . Ace Inhibitors Anaphylaxis    Tongue swelling  . Shellfish Allergy Hives and Swelling    Swelling around face and mouth     VITALS:  Blood pressure (!) 140/59, pulse 95, temperature 98.4 F (36.9 C), temperature source Oral, resp. rate 18, height 4\' 11"  (1.499 m), weight 84 kg (185 lb 3 oz), SpO2 96 %.  PHYSICAL EXAMINATION:   Physical Exam  GENERAL:  76 y.o.-year-old patient lying in the bed with no acute distress. Obese. EYES: Pupils equal, round, reactive to light and accommodation. No scleral icterus. Extraocular muscles intact.  HEENT: Head atraumatic, normocephalic. Oropharynx and nasopharynx clear.  NECK:  Supple, no jugular venous distention. No thyroid  enlargement, no tenderness.  LUNGS: distant breath sounds bilaterally, no wheezing, rales, rhonchi. No use of accessory muscles of respiration.  CARDIOVASCULAR: S1, S2 normal. No murmurs, rubs, or gallops.  ABDOMEN: Soft, nontender, nondistended. Bowel sounds present. No organomegaly or mass.  EXTREMITIES: No cyanosis, clubbing or edema b/l.    NEUROLOGIC: Cranial nerves II through XII are intact. No focal Motor or sensory deficits b/l.   PSYCHIATRIC:  patient is alert and oriented x 3.  SKIN: No obvious rash, lesion, or ulcer.   LABORATORY PANEL:  CBC  Recent Labs Lab 06/27/17 0235  WBC 17.0*  HGB 10.8*  HCT 33.4*  PLT 296    Chemistries   Recent Labs Lab 06/27/17 0235  06/29/17 0351  07/02/17 0505  NA 144  < > 142  < > 144  K 4.8  < > 4.7  < > 4.5  CL 106  < > 106  < > 107  CO2 29  < > 28  < > 27  GLUCOSE 245*  < > 94  < > 197*  BUN 38*  < > 59*  < > 65*  CREATININE 1.98*  < > 2.10*  < > 1.60*  CALCIUM 8.5*  < > 8.2*  < > 8.8*  MG  --   < > 2.6*  --   --   AST 82*  --   --   --   --   ALT 57*  --   --   --   --   ALKPHOS 74  --   --   --   --   BILITOT 0.4  --   --   --   --   < > =  values in this interval not displayed. Cardiac Enzymes  Recent Labs Lab 06/27/17 2041  TROPONINI 0.05*   RADIOLOGY:  No results found. ASSESSMENT AND PLAN:  76 y/o female with PMH of combined systolic and diastolic heart failure, CAD, hyperlipidemia, HTN, COPDon home O2 who was recently discharged on 9/20/2018after being treated for respiratory failure and nosocomial pneumonia. Patient presented to the emergency room with complaints of SOB. When EMS arrived, her SPO2 was in the 50s hence she was placed on CPAP, given nebs   * Acute on chronic respiratory failure due to COPD exacerbation and acute on chronic diastolic congestive heart failure. Wean oxygen. Slowly improving.  * Acute Diastolic and systolic heart failure exacerbation  ON coreg and torsemide  * Acute COPD  exacerbation - steroids, Antibiotics - Scheduled Nebulizers - Inhalers -Wean O2 as tolerated - Consult pulmonary if no improvement  No infiltrates on chest x-ray. Afebrile. Not pneumonia.  * History of type 2 diabetes - ssi - lantus 10 units sq daily - patient's metformin held secondary to elevated creatinine  * Hypertension -now on Zebeta and hydralazine  * Anxiety/panic -When necessary Xanax  Case discussed with Care Management/Social Worker. Management plans discussed with the patient, family and they are in agreement.  CODE STATUS:DNR  DVT Prophylaxis:  lovenox  TOTAL TIME TAKING CARE OF THIS PATIENT: 30 minutes.   POSSIBLE D/C IN 1-2 DAYS, DEPENDING ON CLINICAL CONDITION.  Note: This dictation was prepared with Dragon dictation along with smaller phrase technology. Any transcriptional errors that result from this process are unintentional.  Hillary Bow R M.D on 07/02/2017 at 1:31 PM  Between 7am to 6pm - Pager - [phone]  After 6pm go to [email or web address] - Proofreader  [facility]  Office  [phone]  CC: Primary care physician; Cletis Athens, MDPatient ID: [name], female   DOB: [birth year], 76 y.o.   MRN: [id number]

## 2017-07-02 NOTE — Progress Notes (Signed)
PHARMACY NOTE -  ANTIBIOTIC RENAL DOSE ADJUSTMENT   Request received for Pharmacy to assist with antibiotic renal dose adjustment.  Patient has been initiated on Ceftin for AECOPD. SCr 1.6, estimated CrCl 29 ml/min Will decrease Ceftin dosing to daily.   Ulice Dash, PharmD Clinical Pharmacist

## 2017-07-02 NOTE — Progress Notes (Signed)
CBG 183 per Judeen Hammans NT. Glucometer not synched yet.

## 2017-07-02 NOTE — Progress Notes (Signed)
Per CCMD, patient had 6 beats of PVCs, asymptomatic. Dr. Darvin Neighbours paged.

## 2017-07-02 NOTE — Progress Notes (Signed)
Patient has rested quietly today. Independent in room. Weaned to room air. States she feels like her breathing is better compared to this morning. No complaints. VSS.

## 2017-07-02 NOTE — Progress Notes (Signed)
Advance care planning  Patient has diagnosis of COPD with recurrent admissions for COPD exacerbations since June 2018. Patient is very frustrated with her progress and recurrent admissions. She believes she has been discharged early every time and has had the recurrent in a week to 10 days. Very frustrated. She does not understand why her breathing does not improve. Poor understanding regarding COPD and congestive heart failure. Discussed referring progressive worsening of COPD with time and prognosis. Also discussed regarding congestive heart failure. Advised that patient will need close follow-up with pulmonary and that recovery from COPD exacerbation  takes many weeks. Patient is DO NOT RESUSCITATE/DO NOT INTUBATE.   Time spent 20 minutes

## 2017-07-02 NOTE — Progress Notes (Signed)
Pt in rm 159. VSS. Oriented to unit. Skin assessment re-evaluated. Pt has no concerns at this time. Will monitor.

## 2017-07-02 NOTE — Progress Notes (Addendum)
Pharmacy Consult for electrolyte monitoring   Pharmacy consulted for electrolyte monitoring for 76 yo female admitted with respiratory failure. Patient is ordered torsemide 20mg  PO daily.   Plan:  Electrolytes remain WNL. Will recheck electrolytes 10/8.   Allergies  Allergen Reactions  . Ace Inhibitors Anaphylaxis    Tongue swelling  . Shellfish Allergy Hives and Swelling    Swelling around face and mouth     Patient Measurements: Height: 4\' 11"  (149.9 cm) Weight: 185 lb 3 oz (84 kg) IBW/kg (Calculated) : 43.2  Vital Signs: BP: 152/82 (10/05 2037) Pulse Rate: 94 (10/05 2037) Intake/Output from previous day: 10/05 0701 - 10/06 0700 In: 3 [I.V.:3] Out: -  Intake/Output from this shift: No intake/output data recorded.  Labs:  Recent Labs  06/30/17 0402 07/02/17 0505  CREATININE 1.58* 1.60*   Estimated Creatinine Clearance: 29 mL/min (A) (by C-G formula based on SCr of 1.6 mg/dL (H)).   Sodium  Date Value Ref Range Status  07/02/2017 144 135 - 145 mmol/L Final  06/23/2017 144 134 - 144 mmol/L Final   Potassium  Date Value Ref Range Status  07/02/2017 4.5 3.5 - 5.1 mmol/L Final   Magnesium  Date Value Ref Range Status  06/29/2017 2.6 (H) 1.7 - 2.4 mg/dL Final   Phosphorus  Date Value Ref Range Status  06/29/2017 3.4 2.5 - 4.6 mg/dL Final   Calcium  Date Value Ref Range Status  07/02/2017 8.8 (L) 8.9 - 10.3 mg/dL Final   Albumin  Date Value Ref Range Status  06/27/2017 2.9 (L) 3.5 - 5.0 g/dL Final     Pharmacy will continue to monitor and adjust per consult.   Ulice Dash D 07/02/2017,7:49 AM

## 2017-07-03 LAB — URINALYSIS, ROUTINE W REFLEX MICROSCOPIC
Bacteria, UA: NONE SEEN
Bilirubin Urine: NEGATIVE
GLUCOSE, UA: NEGATIVE mg/dL
HGB URINE DIPSTICK: NEGATIVE
KETONES UR: NEGATIVE mg/dL
Leukocytes, UA: NEGATIVE
Nitrite: NEGATIVE
PROTEIN: 30 mg/dL — AB
Specific Gravity, Urine: 1.006 (ref 1.005–1.030)
pH: 5 (ref 5.0–8.0)

## 2017-07-03 LAB — GLUCOSE, CAPILLARY
Glucose-Capillary: 172 mg/dL — ABNORMAL HIGH (ref 65–99)
Glucose-Capillary: 275 mg/dL — ABNORMAL HIGH (ref 65–99)
Glucose-Capillary: 186 mg/dL — ABNORMAL HIGH (ref 65–99)
Glucose-Capillary: 380 mg/dL — ABNORMAL HIGH (ref 65–99)

## 2017-07-03 LAB — CBC
HEMATOCRIT: 27.1 % — AB (ref 35.0–47.0)
Hemoglobin: 8.9 g/dL — ABNORMAL LOW (ref 12.0–16.0)
MCH: 28.4 pg (ref 26.0–34.0)
MCHC: 32.6 g/dL (ref 32.0–36.0)
MCV: 87.2 fL (ref 80.0–100.0)
PLATELETS: 444 10*3/uL — AB (ref 150–440)
RBC: 3.11 MIL/uL — ABNORMAL LOW (ref 3.80–5.20)
RDW: 16.8 % — AB (ref 11.5–14.5)
WBC: 14.9 10*3/uL — AB (ref 3.6–11.0)

## 2017-07-03 MED ORDER — IPRATROPIUM-ALBUTEROL 0.5-2.5 (3) MG/3ML IN SOLN
3.0000 mL | Freq: Four times a day (QID) | RESPIRATORY_TRACT | Status: DC
  Administered 2017-07-03 – 2017-07-04 (×5): 3 mL via RESPIRATORY_TRACT
  Filled 2017-07-03 (×5): qty 3

## 2017-07-03 MED ORDER — BUDESONIDE 0.25 MG/2ML IN SUSP
0.2500 mg | Freq: Two times a day (BID) | RESPIRATORY_TRACT | Status: DC
  Administered 2017-07-03 – 2017-07-04 (×2): 0.25 mg via RESPIRATORY_TRACT
  Filled 2017-07-03 (×2): qty 2

## 2017-07-03 MED ORDER — FUROSEMIDE 10 MG/ML IJ SOLN
60.0000 mg | Freq: Once | INTRAMUSCULAR | Status: AC
  Administered 2017-07-03: 60 mg via INTRAVENOUS
  Filled 2017-07-03: qty 8

## 2017-07-03 NOTE — Progress Notes (Signed)
[facility] at [place] NAME: Angela Pham    MR#:  [id number]  DATE OF BIRTH:  01-04-1941  SUBJECTIVE:   Continues to have shortness of breath, wheezing.  Yellow sputum.   She has had recurrent admissions over the last 4 months. History of asthma. No history of smoking. Does have diagnosis of COPD.  REVIEW OF SYSTEMS:   Review of Systems  Constitutional: Negative for chills, fever and weight loss.  HENT: Negative for ear discharge, ear pain and nosebleeds.   Eyes: Negative for blurred vision, pain and discharge.  Respiratory: Positive for shortness of breath. Negative for sputum production, wheezing and stridor.   Cardiovascular: Negative for chest pain, palpitations, orthopnea and PND.  Gastrointestinal: Negative for abdominal pain, diarrhea, nausea and vomiting.  Genitourinary: Negative for frequency and urgency.  Musculoskeletal: Negative for back pain and joint pain.  Neurological: Positive for weakness. Negative for sensory change, speech change and focal weakness.  Psychiatric/Behavioral: Negative for depression and hallucinations. The patient is not nervous/anxious.    Tolerating Diet:yes  DRUG ALLERGIES:   Allergies  Allergen Reactions  . Ace Inhibitors Anaphylaxis    Tongue swelling  . Shellfish Allergy Hives and Swelling    Swelling around face and mouth     VITALS:  Blood pressure (!) 153/73, pulse 90, temperature 97.7 F (36.5 C), temperature source Oral, resp. rate 18, height 4\' 11"  (1.499 m), weight 84 kg (185 lb 3 oz), SpO2 99 %.  PHYSICAL EXAMINATION:   Physical Exam  GENERAL:  76 y.o.-year-old patient lying in the bed with no acute distress. Obese. EYES: Pupils equal, round, reactive to light and accommodation. No scleral icterus. Extraocular muscles intact.  HEENT: Head atraumatic, normocephalic. Oropharynx and nasopharynx clear.  NECK:  Supple, no jugular venous distention. No thyroid enlargement, no  tenderness.  LUNGS: distant breath sounds bilaterally, no wheezing, rales, rhonchi. No use of accessory muscles of respiration.  CARDIOVASCULAR: S1, S2 normal. No murmurs, rubs, or gallops.  ABDOMEN: Soft, nontender, nondistended. Bowel sounds present. No organomegaly or mass.  EXTREMITIES: No cyanosis, clubbing or edema b/l.    NEUROLOGIC: Cranial nerves II through XII are intact. No focal Motor or sensory deficits b/l.   PSYCHIATRIC:  patient is alert and oriented x 3.  SKIN: No obvious rash, lesion, or ulcer.   LABORATORY PANEL:  CBC  Recent Labs Lab 07/03/17 0439  WBC 14.9*  HGB 8.9*  HCT 27.1*  PLT 444*    Chemistries   Recent Labs Lab 06/27/17 0235  06/29/17 0351  07/02/17 0505  NA 144  < > 142  < > 144  K 4.8  < > 4.7  < > 4.5  CL 106  < > 106  < > 107  CO2 29  < > 28  < > 27  GLUCOSE 245*  < > 94  < > 197*  BUN 38*  < > 59*  < > 65*  CREATININE 1.98*  < > 2.10*  < > 1.60*  CALCIUM 8.5*  < > 8.2*  < > 8.8*  MG  --   < > 2.6*  --   --   AST 82*  --   --   --   --   ALT 57*  --   --   --   --   ALKPHOS 74  --   --   --   --   BILITOT 0.4  --   --   --   --   < > =  values in this interval not displayed. Cardiac Enzymes  Recent Labs Lab 06/27/17 2041  TROPONINI 0.05*   RADIOLOGY:  No results found. ASSESSMENT AND PLAN:  76 y/o female with PMH of combined systolic and diastolic heart failure, CAD, hyperlipidemia, HTN, COPDon home O2 who was recently discharged on 9/20/2018after being treated for respiratory failure and nosocomial pneumonia. Patient presented to the emergency room with complaints of SOB. When EMS arrived, her SPO2 was in the 50s hence she was placed on CPAP, given nebs   * Acute on chronic respiratory failure due to COPD exacerbation and acute on chronic diastolic congestive heart failure. Wean oxygen. Slowly improving.  * Acute Diastolic and systolic heart failure exacerbation  On coreg and torsemide. Will give one dose iv lasix  *  Acute COPD exacerbation - steroids, Antibiotics - Scheduled Nebulizers - Inhalers - Wean O2 as tolerated Change dulera to pulmicort nebulizer Change nebs to scheduled  No infiltrates on chest x-ray. Afebrile. Not pneumonia.  * History of type 2 diabetes - ssi - lantus 10 units sq daily - patient's metformin held secondary to elevated creatinine  * Hypertension -now on Zebeta and hydralazine  * Anxiety/panic -When necessary Xanax  Case discussed with Care Management/Social Worker. Management plans discussed with the patient, family and they are in agreement.  CODE STATUS:DNR  DVT Prophylaxis:  lovenox  TOTAL TIME TAKING CARE OF THIS PATIENT: 30 minutes.   POSSIBLE D/C IN 1-2 DAYS, DEPENDING ON CLINICAL CONDITION.  Note: This dictation was prepared with Dragon dictation along with smaller phrase technology. Any transcriptional errors that result from this process are unintentional.  Hillary Bow R M.D on 07/03/2017 at 12:44 PM  Between 7am to 6pm - Pager - [phone]  After 6pm go to [email or web address] - Proofreader  [facility]  Office  [phone]  CC: Primary care physician; Cletis Athens, MDPatient ID: [name], female   DOB: [birth year], 76 y.o.   MRN: [id number]

## 2017-07-03 NOTE — Progress Notes (Signed)
Bipap declined 

## 2017-07-03 NOTE — Progress Notes (Signed)
Tele called and said pt had a 4-9 beat run of v-tach. Pt asymptomatic. MD Sudini notified. No new orders at this time.

## 2017-07-03 NOTE — Progress Notes (Signed)
Pt alert and oriented X4. Resting in room. Pt refused heparin injections. New 22 gauge IV placed in left forearm. IV in right hand infiltrated and removed. Urine sample collected. No complaints at this time. Will continue to monitor.

## 2017-07-04 LAB — BASIC METABOLIC PANEL
Anion gap: 10 (ref 5–15)
BUN: 74 mg/dL — AB (ref 6–20)
CALCIUM: 8.6 mg/dL — AB (ref 8.9–10.3)
CHLORIDE: 102 mmol/L (ref 101–111)
CO2: 30 mmol/L (ref 22–32)
CREATININE: 1.66 mg/dL — AB (ref 0.44–1.00)
GFR calc non Af Amer: 29 mL/min — ABNORMAL LOW (ref >60)
GFR, EST AFRICAN AMERICAN: 34 mL/min — AB (ref >60)
GLUCOSE: 236 mg/dL — AB (ref 65–99)
Potassium: 4.1 mmol/L (ref 3.5–5.1)
Sodium: 142 mmol/L (ref 135–145)

## 2017-07-04 LAB — GLUCOSE, CAPILLARY: Glucose-Capillary: 209 mg/dL — ABNORMAL HIGH (ref 65–99)

## 2017-07-04 MED ORDER — PREDNISONE 10 MG (21) PO TBPK: ORAL_TABLET | ORAL | 0 refills | Status: DC

## 2017-07-04 MED ORDER — BUDESONIDE 0.25 MG/2ML IN SUSP: 0.2500 mg | Freq: Two times a day (BID) | RESPIRATORY_TRACT | 0 refills | Status: DC

## 2017-07-04 MED ORDER — TIOTROPIUM BROMIDE MONOHYDRATE 18 MCG IN CAPS: 18.0000 ug | ORAL_CAPSULE | Freq: Every day | RESPIRATORY_TRACT | 0 refills | Status: AC

## 2017-07-04 MED ORDER — SENNOSIDES-DOCUSATE SODIUM 8.6-50 MG PO TABS: 2.0000 | ORAL_TABLET | Freq: Two times a day (BID) | ORAL | 0 refills | Status: AC

## 2017-07-04 NOTE — Care Management Note (Signed)
Case Management Note  Patient Details  Name: Leighana Neyman MRN: [id number] Date of Birth: [birth year]  Subjective/Objective:  Met with patient at bedside to discuss discharge planning. [facility] would not accept patient back due to noncompliance. Patient would like a nurse and a HHA. She just doesn't want all the phone calls and the tele health. No agency preference. [facility]erral to [facility] for SN and H[facility].                   Action/Plan: [facility] for nursing and PT.   Expected Discharge Date:  07/04/17               Expected Discharge Plan:  [place]  In-House Referral:     Discharge planning Services  CM Consult  Post Acute Care Choice:  Home Health Choice offered to:  Patient  DME Arranged:    DME Agency:     HH Arranged:  RN, Nurse's Aide [place] Agency:  [facility] [employer]  Status of Service:  Completed, signed off  If discussed at [employer], dates discussed:    Additional Comments:  Jolly Mango, RN 07/04/2017, 11:53 AM

## 2017-07-04 NOTE — Progress Notes (Signed)
Patient is alert and oriented and able to verbalize needs. No complaints of pain at this time. VSS. PIV removed. Discharge instructions gone over with patient at this time. Printed AVS given to patient. Patient verbalizes understanding of all follow up instructions and where to pick up medications. No concerns voiced at this time.   Bethann Punches, RN

## 2017-07-04 NOTE — Progress Notes (Signed)
bipap declined 

## 2017-07-04 NOTE — Progress Notes (Signed)
Pt's family arrived to transport pt home. Volunteer services escorted pt to car via wc.

## 2017-07-04 NOTE — Discharge Instructions (Signed)
°-   Daily fluids < 2 liters. - Low salt diet - Check weight everyday and keep log. Take to your doctors appt. - Take extra dose of lasix if you gain more than 3 pounds weight.  Activity as before

## 2017-07-04 NOTE — Progress Notes (Signed)
Pharmacy Consult for electrolyte monitoring   Pharmacy consulted for electrolyte monitoring for 76 yo female admitted with respiratory failure. Patient is ordered torsemide 20mg  PO daily.   Plan:  Electrolytes remain WNL. Will sign off. Thank you for the consult.   Allergies  Allergen Reactions  . Ace Inhibitors Anaphylaxis    Tongue swelling  . Shellfish Allergy Hives and Swelling    Swelling around face and mouth     Patient Measurements: Height: 4\' 11"  (149.9 cm) Weight: 185 lb 3 oz (84 kg) IBW/kg (Calculated) : 43.2  Vital Signs: Temp: 97.8 F (36.6 C) (10/08 0806) Temp Source: Oral (10/08 0806) BP: 156/63 (10/08 0806) Pulse Rate: 100 (10/08 0806) Intake/Output from previous day: 10/07 0701 - 10/08 0700 In: 240 [P.O.:240] Out: 350 [Urine:350] Intake/Output from this shift: Total I/O In: 240 [P.O.:240] Out: -   Labs:  Recent Labs  07/02/17 0505 07/03/17 0439 07/04/17 0500  WBC  --  14.9*  --   HGB  --  8.9*  --   HCT  --  27.1*  --   PLT  --  444*  --   CREATININE 1.60*  --  1.66*   Estimated Creatinine Clearance: 27.9 mL/min (A) (by C-G formula based on SCr of 1.66 mg/dL (H)).   Sodium  Date Value Ref Range Status  07/04/2017 142 135 - 145 mmol/L Final  06/23/2017 144 134 - 144 mmol/L Final   Potassium  Date Value Ref Range Status  07/04/2017 4.1 3.5 - 5.1 mmol/L Final   Magnesium  Date Value Ref Range Status  06/29/2017 2.6 (H) 1.7 - 2.4 mg/dL Final   Phosphorus  Date Value Ref Range Status  06/29/2017 3.4 2.5 - 4.6 mg/dL Final   Calcium  Date Value Ref Range Status  07/04/2017 8.6 (L) 8.9 - 10.3 mg/dL Final   Albumin  Date Value Ref Range Status  06/27/2017 2.9 (L) 3.5 - 5.0 g/dL Final     Ulice Dash D 07/04/2017,12:46 PM

## 2017-07-05 LAB — URINE CULTURE: CULTURE: NO GROWTH

## 2017-07-06 ENCOUNTER — Encounter: Payer: Self-pay | Admitting: Family

## 2017-07-06 ENCOUNTER — Ambulatory Visit (INDEPENDENT_AMBULATORY_CARE_PROVIDER_SITE_OTHER): Payer: 59 | Admitting: Pulmonary Disease

## 2017-07-06 ENCOUNTER — Encounter: Payer: Self-pay | Admitting: Pulmonary Disease

## 2017-07-06 ENCOUNTER — Ambulatory Visit: Payer: 59 | Attending: Family | Admitting: Family

## 2017-07-06 VITALS — BP 141/65 | HR 98 | Resp 20 | Ht 59.0 in | Wt 180.2 lb

## 2017-07-06 VITALS — BP 144/70 | HR 96 | Resp 16 | Ht 59.0 in | Wt 180.0 lb

## 2017-07-06 DIAGNOSIS — E119 Type 2 diabetes mellitus without complications: Secondary | ICD-10-CM | POA: Diagnosis not present

## 2017-07-06 DIAGNOSIS — I10 Essential (primary) hypertension: Secondary | ICD-10-CM

## 2017-07-06 DIAGNOSIS — I5032 Chronic diastolic (congestive) heart failure: Secondary | ICD-10-CM

## 2017-07-06 DIAGNOSIS — J449 Chronic obstructive pulmonary disease, unspecified: Secondary | ICD-10-CM

## 2017-07-06 DIAGNOSIS — J45909 Unspecified asthma, uncomplicated: Secondary | ICD-10-CM | POA: Insufficient documentation

## 2017-07-06 DIAGNOSIS — I110 Hypertensive heart disease with heart failure: Secondary | ICD-10-CM | POA: Diagnosis present

## 2017-07-06 DIAGNOSIS — J189 Pneumonia, unspecified organism: Secondary | ICD-10-CM | POA: Diagnosis not present

## 2017-07-06 DIAGNOSIS — Y95 Nosocomial condition: Secondary | ICD-10-CM | POA: Insufficient documentation

## 2017-07-06 DIAGNOSIS — I2510 Atherosclerotic heart disease of native coronary artery without angina pectoris: Secondary | ICD-10-CM | POA: Diagnosis present

## 2017-07-06 DIAGNOSIS — E668 Other obesity: Secondary | ICD-10-CM | POA: Diagnosis not present

## 2017-07-06 DIAGNOSIS — I272 Pulmonary hypertension, unspecified: Secondary | ICD-10-CM | POA: Insufficient documentation

## 2017-07-06 DIAGNOSIS — J9622 Acute and chronic respiratory failure with hypercapnia: Secondary | ICD-10-CM

## 2017-07-06 DIAGNOSIS — N184 Chronic kidney disease, stage 4 (severe): Secondary | ICD-10-CM

## 2017-07-06 DIAGNOSIS — G4734 Idiopathic sleep related nonobstructive alveolar hypoventilation: Secondary | ICD-10-CM

## 2017-07-06 DIAGNOSIS — Z7982 Long term (current) use of aspirin: Secondary | ICD-10-CM | POA: Diagnosis not present

## 2017-07-06 DIAGNOSIS — G4733 Obstructive sleep apnea (adult) (pediatric): Secondary | ICD-10-CM | POA: Diagnosis not present

## 2017-07-06 DIAGNOSIS — Z7984 Long term (current) use of oral hypoglycemic drugs: Secondary | ICD-10-CM | POA: Diagnosis not present

## 2017-07-06 DIAGNOSIS — E1122 Type 2 diabetes mellitus with diabetic chronic kidney disease: Secondary | ICD-10-CM

## 2017-07-06 DIAGNOSIS — J440 Chronic obstructive pulmonary disease with acute lower respiratory infection: Secondary | ICD-10-CM | POA: Insufficient documentation

## 2017-07-06 DIAGNOSIS — I2721 Secondary pulmonary arterial hypertension: Secondary | ICD-10-CM

## 2017-07-06 DIAGNOSIS — E785 Hyperlipidemia, unspecified: Secondary | ICD-10-CM | POA: Insufficient documentation

## 2017-07-06 DIAGNOSIS — J4540 Moderate persistent asthma, uncomplicated: Secondary | ICD-10-CM

## 2017-07-06 LAB — GLUCOSE, CAPILLARY: Glucose-Capillary: 168 mg/dL — ABNORMAL HIGH (ref 65–99)

## 2017-07-06 MED ORDER — ARFORMOTEROL TARTRATE 15 MCG/2ML IN NEBU: 15.0000 ug | INHALATION_SOLUTION | Freq: Two times a day (BID) | RESPIRATORY_TRACT | 10 refills | Status: DC

## 2017-07-06 NOTE — Progress Notes (Signed)
PULMONARY CONSULT NOTE  Requesting MD/Service: Hospitalist Service Date of initial consultation: 05/2016 Reason for consultation: Acute on chronic hypercarbic respiratory failure, COPD/asthma, OSA  PT PROFILE: 76 y.o. female never smoker with long standing asthma and repeated  Hospitalizations 03/2017 to 06/2017 for repeated episodes of hypercarbic respiratory failure. Also has history of CHF   DATA: 03/08/17 Echocardiogram: 45% to 50%. There is hypokinesis of the basal-midanteroseptal myocardium. LA mildly dilated. RSVP est 55-60 mmHg 05/28/17 CTA  Chest: No PE. No acute findings. PAs mildly dilated 06/23/17 PSG: Reduced sleep efficiency. AHI 10. Heavy snoring. Severe oxygen desaturation (lowest SpO2 71%)  SUBJ: This is a post hospital follow up. She has had repeated hospitalizations over past several months and is very frustrated. Now, gradually returning to her previous baseline. Remains on prednisone taper since most recent hospitalization. Denies CP, fever, purulent sputum, hemoptysis, LE edema and calf tenderness.   Vitals:   07/06/17 0822 07/06/17 0827  BP:  (!) 144/70  Pulse:  96  Resp: 16   SpO2:  94%  Weight: 81.6 kg (180 lb)   Height: 4\' 11"  (1.499 m)      EXAM:  Gen: No overt respiratory distress HEENT: NCAT, sclera white, oropharynx normal Neck: Supple without LAN, thyromegaly. JVP not visualized @ 90 degrees Lungs: breath sounds moderately redeuced, no wheezes or other adventitious sounds Cardiovascular: RRR, no murmurs noted Abdomen: obese soft, nontender, normal BS Ext: without clubbing, cyanosis, edema Neuro: CNs grossly intact, motor and sensory intact Skin: Limited exam, no lesions noted  DATA:   BMP Latest Ref Rng & Units 07/04/2017 07/02/2017 06/30/2017  Glucose 65 - 99 mg/dL 236(H) 197(H) 145(H)  BUN 6 - 20 mg/dL 74(H) 65(H) 51(H)  Creatinine 0.44 - 1.00 mg/dL 1.66(H) 1.60(H) 1.58(H)  BUN/Creat Ratio 12 - 28 - - -  Sodium 135 - 145 mmol/L 142 144 143   Potassium 3.5 - 5.1 mmol/L 4.1 4.5 4.6  Chloride 101 - 111 mmol/L 102 107 105  CO2 22 - 32 mmol/L 30 27 29   Calcium 8.9 - 10.3 mg/dL 8.6(L) 8.8(L) 8.7(L)    CBC Latest Ref Rng & Units 07/03/2017 06/27/2017 06/14/2017  WBC 3.6 - 11.0 K/uL 14.9(H) 17.0(H) 20.0(H)  Hemoglobin 12.0 - 16.0 g/dL 8.9(L) 10.8(L) 10.6(L)  Hematocrit 35.0 - 47.0 % 27.1(L) 33.4(L) 32.5(L)  Platelets 150 - 440 K/uL 444(H) 296 260    CXR (06/27/17):   CM, interstitial prominence  IMPRESSION:     ICD-10-CM   1. OSA (obstructive sleep apnea) G47.33 Cpap titration  2. Chronic obstructive airway disease with asthma ([place]) J44.9   3. Moderate obesity E66.8   4. Recurrent admissions for acute on chronic respiratory failure with hypercapnia (H[facility]) J96.22   5. Nocturnal hypoxemia G47.34   6. 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 To clarify,asthma regimen should be as follows: 1) Pulmicort - 0.25 mg nebulized twice a day every day 2) arformoterol (Brovana) 15 mcg BID- this is a new medication. It may be mixed with Pulmicort and nebulized twice a day every day. 3) continue Singulair 10 mg daily 4) continue Spiriva - one inhalation daily 5) continue DuoNeb as needed  Complete the prednisone taper as ordered at discharge  A CPAP titration study has been ordered and after that has been completed, we will initiate CPAP  Continue nocturnal oxygen  Over time, we will need to discuss weight loss and strategies to achieve that   Merton Border, MD PCCM service Mobile [phone] Pager [phone] 07/10/2017 8:17 PM

## 2017-07-06 NOTE — Discharge Summary (Signed)
[facility] NAME: Angela Pham    MR#:  [id number]  DATE OF BIRTH:  07-Sep-1941  DATE OF ADMISSION:  06/27/2017 ADMITTING PHYSICIAN: Saundra Shelling, MD  DATE OF DISCHARGE: 07/04/2017  1:59 PM  PRIMARY CARE PHYSICIAN: Cletis Athens, MD   ADMISSION DIAGNOSIS:  COPD exacerbation ([place]) [J44.1] Acute respiratory failure with hypoxia and hypercapnia ([place]) [J96.01, J96.02]  DISCHARGE DIAGNOSIS:  Active Problems:   Respiratory failure ([place])   DNR (do not resuscitate)   SECONDARY DIAGNOSIS:   Past Medical History:  Diagnosis Date  . Asthma   . Chronic combined systolic and diastolic CHF (congestive heart failure) ([place])    a. TTE 02/2017: EF 45-50%, basal amd midanteroseptal HK b. 05/2017: echo showing EF of 35-40%, mild AI, moderate MR, and mildly dilated LA.   Marland Kitchen Coronary artery disease, non-occlusive    a. LHC 02/2017 normal coronary arteries  . Hyperlipidemia   . Hypertension   . Morbid obesity ([place])   . Pulmonary hypertension ([place])   . Type II diabetes mellitus ([place])      ADMITTING HISTORY   HISTORY OF PRESENT ILLNESS: Angela Pham  is a 76 y.o. female with a known history of combined systolic and diastolic heart failure, coronary artery disease, hyperlipidemia, hypertension, obesity, pulmonary hypertension, chronic respiratory failure on oxygen at home was recently discharged on 09/20/2018to home after being treated for respiratory failure and nosocomial pneumonia. Patient presented to the emergency room with increased shortness of breath and her O2 saturations were low less than 70% on room air She was put on BiPAP and stabilized in the emergency room. Has some occasional cough and still has swelling of the legs secondary to fluid retention.Patient has wheezes in both lung fields. She was evaluated in the emergency room with ABG which showed elevated CO2 level. No complaints of any chest pain. Hospitalist service was consulted for  further care.  HOSPITAL COURSE:   76 y/o female with PMH of combined systolic and diastolic heart failure, CAD, hyperlipidemia, HTN, COPDon home O2 who was recently dischargedon 9/20/2018after being treated for respiratory failure and nosocomial pneumonia. Patient presented to the emergency room with complaints ofSOB. When EMS arrived, her SPO2 was in the 50s hence she was placed on CPAP, given nebs   * Acute on chronic respiratory failure due to COPD exacerbation and acute on chronic diastolic congestive heart failure.  Patient improved well and on 2 L oxygen by the day of discharge.  * Acute Diastolic and systolicheart failure exacerbation  On coreg and torsemide. No signs of fluid overload by the time of discharge.  * Acute COPD exacerbation - steroids, Antibiotics - Scheduled Nebulizers - Inhalers - Wean O2 as tolerated Changed dulera to pulmicort nebulizer Change nebs to scheduled Patient was switched to prednisone taper at discharge. She was on corticosteroid inhalers but she wanted to be on nebulizer so prescription given for Pulmicort. Increase changed to Spiriva due to insurance issues.   No infiltrates on chest x-ray. Afebrile. Not pneumonia.  * History of type 2 diabetes - ssi in the hospital. Uncontrolled due to steroid use.  * Hypertension - Metoprolol and hydralazine  * Anxiety/panic -When necessary Xanax used in the hospital  Patient has follow-up with pulmonary clinic on 07/06/2017 and the heart failure clinic within a week.  CONSULTS OBTAINED:    DRUG ALLERGIES:   Allergies  Allergen Reactions  . Ace Inhibitors Anaphylaxis    Tongue swelling  . Shellfish Allergy Hives  and Swelling    Swelling around face and mouth     DISCHARGE MEDICATIONS:   Discharge Medication List as of 07/04/2017  9:58 AM    START taking these medications   Details  budesonide (PULMICORT) 0.25 MG/2ML nebulizer solution Take 2 mLs (0.25 mg total) by nebulization  2 (two) times daily., Starting Mon 07/04/2017, Normal    predniSONE (STERAPRED UNI-PAK 21 TAB) 10 MG (21) TBPK tablet 60 mg on day 1  And taper 10 mg daily, Normal    senna-docusate (SENOKOT-S) 8.6-50 MG tablet Take 2 tablets by mouth 2 (two) times daily., Starting Mon 07/04/2017, Normal    tiotropium (SPIRIVA HANDIHALER) 18 MCG inhalation capsule Place 1 capsule (18 mcg total) into inhaler and inhale daily., Starting Mon 07/04/2017, Until Tue 07/04/2018, Normal      CONTINUE these medications which have NOT CHANGED   Details  aspirin EC 81 MG EC tablet Take 1 tablet (81 mg total) by mouth daily., Starting Sat 03/12/2017, OTC    atorvastatin (LIPITOR) 40 MG tablet Take 40 mg by mouth daily., Historical Med    carvedilol (COREG) 6.25 MG tablet Take 1 tablet (6.25 mg total) by mouth 2 (two) times daily., Starting Thu 06/23/2017, Normal    hydrALAZINE (APRESOLINE) 25 MG tablet Take 1 tablet (25 mg total) by mouth every 8 (eight) hours., Starting Sun 05/08/2017, Print    Ipratropium-Albuterol (COMBIVENT IN) Inhale 3 puffs into the lungs every 4 (four) hours as needed., Historical Med    ipratropium-albuterol (DUONEB) 0.5-2.5 (3) MG/3ML SOLN Inhale 3 mLs into the lungs every 4 (four) hours as needed., Historical Med    levocetirizine (XYZAL) 5 MG tablet Take 10 mg by mouth daily., Starting Fri 03/18/2017, Historical Med    metFORMIN (GLUCOPHAGE) 850 MG tablet Take 1 tablet by mouth 2 (two) times daily., Historical Med    mirtazapine (REMERON) 45 MG tablet Take 1 tablet by mouth at bedtime., Historical Med    montelukast (SINGULAIR) 10 MG tablet Take 1 tablet (10 mg total) by mouth at bedtime., Starting Fri 03/11/2017, Normal    Multiple Vitamins-Minerals (EYE VITAMINS PO) Take 1 tablet by mouth 2 (two) times daily. , Historical Med    spironolactone (ALDACTONE) 25 MG tablet Take 0.5 tablets (12.5 mg total) by mouth daily., Starting Thu 06/23/2017, Normal    torsemide (DEMADEX) 20 MG tablet Take  one tablet daily. May take one extra tablet daily for weight gain >3lbs overnight or >5lbs in a week., Normal      STOP taking these medications     Fluticasone-Salmeterol (ADVAIR DISKUS) 250-50 MCG/DOSE AEPB      umeclidinium bromide (INCRUSE ELLIPTA) 62.5 MCG/INH AEPB         Today   VITAL SIGNS:  Blood pressure (!) 156/63, pulse 100, temperature 97.8 F (36.6 C), temperature source Oral, resp. rate 14, height 4\' 11"  (1.499 m), weight 84 kg (185 lb 3 oz), SpO2 90 %.  I/O:  No intake or output data in the 24 hours ending 07/06/17 1432  PHYSICAL EXAMINATION:  Physical Exam  GENERAL:  76 y.o.-year-old patient lying in the bed with no acute distress.  LUNGS: Normal breath sounds bilaterally, no wheezing, rales,rhonchi or crepitation. No use of accessory muscles of respiration.  CARDIOVASCULAR: S1, S2 normal. No murmurs, rubs, or gallops.  ABDOMEN: Soft, non-tender, non-distended. Bowel sounds present. No organomegaly or mass.  NEUROLOGIC: Moves all 4 extremities. PSYCHIATRIC: The patient is alert and oriented x 3.  SKIN: No obvious rash, lesion, or ulcer.  DATA REVIEW:   CBC  Recent Labs Lab 07/03/17 0439  WBC 14.9*  HGB 8.9*  HCT 27.1*  PLT 444*    Chemistries   Recent Labs Lab 07/04/17 0500  NA 142  K 4.1  CL 102  CO2 30  GLUCOSE 236*  BUN 74*  CREATININE 1.66*  CALCIUM 8.6*    Cardiac Enzymes No results for input(s): TROPONINI in the last 168 hours.  Microbiology Results  Results for orders placed or performed during the hospital encounter of 06/27/17  Culture, blood (Routine X 2) w Reflex to ID Panel     Status: None   Collection Time: 06/27/17  6:19 AM  Result Value Ref Range Status   Specimen Description BLOOD LEFT HAND  Final   Special Requests   Final    BOTTLES DRAWN AEROBIC AND ANAEROBIC Blood Culture adequate volume   Culture NO GROWTH 5 DAYS  Final   Report Status 07/02/2017 FINAL  Final  Culture, blood (Routine X 2) w Reflex to ID  Panel     Status: None   Collection Time: 06/27/17  8:47 AM  Result Value Ref Range Status   Specimen Description BLOOD LEFT HAND  Final   Special Requests   Final    BOTTLES DRAWN AEROBIC AND ANAEROBIC Blood Culture adequate volume   Culture NO GROWTH 5 DAYS  Final   Report Status 07/02/2017 FINAL  Final  Urine Culture     Status: None   Collection Time: 07/03/17  4:40 PM  Result Value Ref Range Status   Specimen Description URINE, CLEAN CATCH  Final   Special Requests NONE  Final   Culture   Final    NO GROWTH Performed at [facility], [place] [address], [place], [place] [address]    Report Status 07/05/2017 FINAL  Final    RADIOLOGY:  No results found.  Follow up with PCP in 1 week.  Management plans discussed with the patient, family and they are in agreement.  CODE STATUS:  Code Status History    Date Active Date Inactive Code Status Order ID Comments User Context   06/27/2017  4:29 AM 07/04/2017  4:59 PM DNR [id number]  Saundra Shelling, MD Inpatient   06/13/2017  4:25 AM 06/16/2017  6:16 PM DNR [id number]  Mikael Spray, NP Inpatient   06/12/2017  3:28 AM 06/13/2017  4:25 AM Full Code [id number]  Lanier, Dimmitt, DO Inpatient   06/12/2017  2:00 AM 06/12/2017  3:28 AM DNR [id number]  Harvie Bridge, DO ED   05/29/2017  2:49 AM 06/02/2017  9:23 PM DNR [id number]  Verlin Dike, RN Inpatient   05/28/2017 10:59 PM 05/29/2017  2:49 AM Full Code [id number]  Hugelmeyer, Ubaldo Glassing, DO Inpatient   05/04/2017  5:19 PM 05/08/2017  5:18 PM DNR [id number]  Loletha Grayer, MD ED   03/09/2017 12:34 PM 03/11/2017 10:08 PM Full Code [id number]  Minna Merritts, MD Inpatient   03/07/2017  5:12 AM 03/09/2017 12:34 PM Full Code [id number]  Saundra Shelling, MD Inpatient    Questions for Most Recent Historical Code Status (Order [id number])    Question Answer Comment   In the event of cardiac or respiratory ARREST Do not call a "code blue"    In the event of cardiac or respiratory ARREST Do not  perform Intubation, CPR, defibrillation or ACLS    In the event of cardiac or respiratory ARREST Use medication by any route, position, wound care, and other measures to relive pain and  suffering. May use oxygen, suction and manual treatment of airway obstruction as needed for comfort.         Advance Directive Documentation     Most Recent Value  Type of Advance Directive  Healthcare Power of Attorney, Living will  Pre-existing out of facility DNR order (yellow form or pink MOST form)  -  "MOST" Form in Place?  -      TOTAL TIME TAKING CARE OF THIS PATIENT ON DAY OF DISCHARGE: more than 30 minutes.   Hillary Bow R M.D on 07/06/2017 at 2:32 PM  Between 7am to 6pm - Pager - [phone]  After 6pm go to [email or web address] - password [email or web address] [facility]  Office  [phone]  CC: Primary care physician; Cletis Athens, MD  Note: This dictation was prepared with Dragon dictation along with smaller phrase technology. Any transcriptional errors that result from this process are unintentional.

## 2017-07-06 NOTE — Progress Notes (Signed)
Patient ID: Angela Pham, female    DOB: [birth year], 76 y.o.   MRN: [id number]  HPI  Ms Ziehm is a 76 y/o female with a history of asthma, CAD, DM, hyperlipidemia, HTN and chronic heart failure.   Reviewed echo report from 03/08/17 which showed an EF of 45-50% along with moderate MR and mild AR. Systolic pressure was moderately increased to 55-60 mm Hg. Right and left cardiac catheterization done 03/09/17 which resulted in normal coronary arteries. Markedly elevated LVEDP.   Admitted 06/27/17 due to COPD exacerbation. Initially put on bipap. Had elevated CO2 level noted with ABG. Placed in ICU for monitoring. Pulmonology consult obtained. Medications were adjusted and she was discharged after 7 days.Admitted 06/11/17 due to nosocomial pneumonia along with elevated troponin and COPD exacerbation. Cardiology consult was obtained. Initially needed bipap and then transitioned to nasal cannula. Elevated troponin thought to be due to demand ischemia. Discharged home after 5 days. Admitted 05/28/17 due to COPD exacerbation. Medications were adjusted and she was discharged after 5 days. Admitted 05/04/17 due to asthma exacerbation, acute bronchitis and HF exacerbation. Initially needed bipap and then transitioned to nasal cannula. IV solu-medrol initially needed as well along with nebulizer. Continue antibiotics for bronchitis. Nephrology consult obtained. Discharged home after 4 days.   She presents today for a follow-up visit with a chief complaint of mild fatigue upon moderate exertion. She describes this as chronic in nature having been present for several years with varying levels of severity. She has associated edema on occasion along with this. She denies any chest pain, shortness of breath, palpitations, dizziness, difficulty sleeping or weight gain. She is currently finishing out a prednisone taper.   Past Medical History:  Diagnosis Date  . Asthma   . Chronic combined systolic and diastolic CHF (congestive  heart failure) ([place])    a. TTE 02/2017: EF 45-50%, basal amd midanteroseptal HK b. 05/2017: echo showing EF of 35-40%, mild AI, moderate MR, and mildly dilated LA.   Marland Kitchen Coronary artery disease, non-occlusive    a. LHC 02/2017 normal coronary arteries  . Hyperlipidemia   . Hypertension   . Morbid obesity ([place])   . Pulmonary hypertension ([place])   . Type II diabetes mellitus ([place])    Past Surgical History:  Procedure Laterality Date  . c-section    . RIGHT/LEFT HEART CATH AND CORONARY ANGIOGRAPHY N/A 03/09/2017   Procedure: Right/Left Heart Cath and Coronary Angiography;  Surgeon: Minna Merritts, MD;  Location: [facility];  Service: Cardiovascular;  Laterality: N/A;   Family History  Problem Relation Age of Onset  . CAD Father   . Colon cancer Sister   . Leukemia Brother   . Throat cancer Brother   . Cervical cancer Sister    Social History  Substance Use Topics  . Smoking status: Never Smoker  . Smokeless tobacco: Never Used  . Alcohol use No   Allergies  Allergen Reactions  . Ace Inhibitors Anaphylaxis    Tongue swelling  . Shellfish Allergy Hives and Swelling    Swelling around face and mouth    Prior to Admission medications   Medication Sig Start Date End Date Taking? Authorizing Provider  aspirin EC 81 MG EC tablet Take 1 tablet (81 mg total) by mouth daily. 03/12/17  Yes Mody, Ulice Bold, MD  atorvastatin (LIPITOR) 40 MG tablet Take 40 mg by mouth daily.   Yes [provider]  budesonide (PULMICORT) 0.25 MG/2ML nebulizer solution Take 2 mLs (0.25 mg total) by  nebulization 2 (two) times daily. 07/04/17  Yes Sudini, Alveta Heimlich, MD  carvedilol (COREG) 6.25 MG tablet Take 1 tablet (6.25 mg total) by mouth 2 (two) times daily. 06/23/17  Yes Strader, [place] M, PA-C  hydrALAZINE (APRESOLINE) 25 MG tablet Take 1 tablet (25 mg total) by mouth every 8 (eight) hours. Patient taking differently: Take 25 mg by mouth 3 (three) times daily.  05/08/17  Yes Vaughan Basta, MD  Ipratropium-Albuterol (COMBIVENT IN) Inhale 3 puffs into the lungs every 4 (four) hours as needed.   Yes [provider]  ipratropium-albuterol (DUONEB) 0.5-2.5 (3) MG/3ML SOLN Inhale 3 mLs into the lungs every 4 (four) hours as needed.   Yes [provider]  levocetirizine (XYZAL) 5 MG tablet Take 10 mg by mouth daily. 03/18/17  Yes [provider]  metFORMIN (GLUCOPHAGE) 850 MG tablet Take 1 tablet by mouth 2 (two) times daily.   Yes [provider]  mirtazapine (REMERON) 45 MG tablet Take 1 tablet by mouth at bedtime.   Yes [provider]  montelukast (SINGULAIR) 10 MG tablet Take 1 tablet (10 mg total) by mouth at bedtime. 03/11/17  Yes Mody, Ulice Bold, MD  Multiple Vitamins-Minerals (EYE VITAMINS PO) Take 1 tablet by mouth 2 (two) times daily.    Yes [provider]  predniSONE (STERAPRED UNI-PAK 21 TAB) 10 MG (21) TBPK tablet 60 mg on day 1  And taper 10 mg daily 07/04/17  Yes Sudini, Srikar, MD  senna-docusate (SENOKOT-S) 8.6-50 MG tablet Take 2 tablets by mouth 2 (two) times daily. 07/04/17  Yes Sudini, Alveta Heimlich, MD  spironolactone (ALDACTONE) 25 MG tablet Take 0.5 tablets (12.5 mg total) by mouth daily. 06/23/17  Yes Strader, [place], PA-C  tiotropium (SPIRIVA HANDIHALER) 18 MCG inhalation capsule Place 1 capsule (18 mcg total) into inhaler and inhale daily. 07/04/17 07/04/18 Yes Sudini, Alveta Heimlich, MD  torsemide (DEMADEX) 20 MG tablet Take one tablet daily. May take one extra tablet daily for weight gain >3lbs overnight or >5lbs in a week. 06/24/17  Yes Strader, [place] M, PA-C  arformoterol (BROVANA) 15 MCG/2ML NEBU Take 2 mLs (15 mcg total) by nebulization 2 (two) times daily. Patient not taking: Reported on 07/06/2017 07/06/17   Wilhelmina Mcardle, MD    Review of Systems  Constitutional: Positive for fatigue. Negative for appetite change.  HENT: Negative for congestion, postnasal drip and sore throat.   Eyes: Negative.    Respiratory: Negative for chest tightness, shortness of breath and wheezing.   Cardiovascular: Positive for leg swelling (minimal yesterday). Negative for chest pain and palpitations.  Gastrointestinal: Negative for abdominal distention and abdominal pain.  Endocrine: Negative.   Genitourinary: Negative.   Musculoskeletal: Negative for back pain and neck pain.  Skin: Negative.   Allergic/Immunologic: Negative.   Neurological: Negative for dizziness and light-headedness.  Hematological: Negative for adenopathy. Does not bruise/bleed easily.  Psychiatric/Behavioral: Negative for dysphoric mood and sleep disturbance. The patient is not nervous/anxious.    Vitals:   07/06/17 0944  BP: (!) 141/65  Pulse: 98  Resp: 20  SpO2: 100%  Weight: 180 lb 4 oz (81.8 kg)  Height: 4\' 11"  (1.499 m)   Wt Readings from Last 3 Encounters:  07/06/17 180 lb 4 oz (81.8 kg)  07/06/17 180 lb (81.6 kg)  06/27/17 185 lb 3 oz (84 kg)   Lab Results  Component Value Date   CREATININE 1.66 (H) 07/04/2017   CREATININE 1.60 (H) 07/02/2017   CREATININE 1.58 (H) 06/30/2017   Physical Exam  Constitutional: She is oriented to person, place, and time. She appears well-developed and well-nourished.  HENT:  Head: Normocephalic and atraumatic.  Neck: Normal range of motion. Neck supple. No JVD present.  Cardiovascular: Regular rhythm.  Tachycardia present.   Pulmonary/Chest: Effort normal. No respiratory distress. She has no wheezes. She has no rales.  Abdominal: Soft. She exhibits no distension. There is no tenderness.  Musculoskeletal: She exhibits no edema or tenderness.  Neurological: She is alert and oriented to person, place, and time.  Skin: Skin is warm and dry.  Psychiatric: She has a normal mood and affect. Her behavior is normal. Thought content normal.  Nursing note and vitals reviewed.   Assessment & Plan:  1: Chronic heart failure with preserved ejection fraction- - NYHA class II - euvolemic  today - weighing daily; reminded to call for an overnight weight gain of >2 pounds or a weekly weight gain of >5 pounds - not adding salt and is trying to read food labels. Reviewed the importance of closely following a 2000mg  sodium diet - patient reported receiving her flu/pneumonia vaccines during most recent hospitalization - saw cardiology 06/23/17 and returns 08/05/17 - PharmD went in and reviewed medications with the patient - has to return for her 2nd part of the sleep study and is waiting to hear when that is - took an extra 20mg  torsemide yesterday due to pedal edema  2: Asthma- - lungs clear today - does wear oxygen at 2L at bedtime - has inhalers and nebulizer that she uses - follows with pulmonologist Alva Garnet) and she sees him 08/05/17  3: HTN- - BP looks good today - continue medications at this time - BMP from 07/04/17 reviewed and showed sodium 142, potassium 4.1 and GFR 29  4: Diabetes- - nonfasting glucose in clinic today was 168 after she ate apple cinnamon oatmeal for breakfast - doesn't check her glucose daily and follows with PCP regarding this - HgA1c on 03/09/17 was 5.8% - should renal function continue to decline, may need to stop metformin  Patient did not bring her medications nor a list. Each medication was verbally reviewed with the patient and she was encouraged to bring the bottles to every visit to confirm accuracy of list.  Return in 3 months or sooner for any questions/problems before then.

## 2017-07-06 NOTE — Patient Instructions (Addendum)
We did not make many changes today and you have just been discharged from the hospital. To clarify, your asthma regimen should be as follows: 1) Pulmicort - 0.25 mg nebulized twice a day every day 2) arformoterol ([place]) - this is a new medication. It may be mixed with Pulmicort and nebulized twice a day every day. 3) continue Singulair 10 mg daily 4) continue Spiriva - one inhalation daily 5) continue DuoNeb as needed  Complete the prednisone taper as ordered at discharge  A CPAP titration study has been ordered and after that has been completed, we will initiate CPAP  Continue nocturnal oxygen  Over time, we will need to discuss weight loss and strategies to achieve that  At some point, we will need to obtain pulmonary function tests  You might be a very good candidate for a pulmonary rehabilitation program. This can be discussed in the future  Follow-up with me on 11/09 at 1:45 PM

## 2017-07-06 NOTE — Patient Instructions (Signed)
Continue weighing daily and call for an overnight weight gain of > 2 pounds or a weekly weight gain of >5 pounds. 

## 2017-07-19 ENCOUNTER — Ambulatory Visit: Payer: 59 | Attending: Internal Medicine

## 2017-07-19 DIAGNOSIS — G4733 Obstructive sleep apnea (adult) (pediatric): Secondary | ICD-10-CM | POA: Insufficient documentation

## 2017-07-22 ENCOUNTER — Telehealth: Payer: Self-pay | Admitting: *Deleted

## 2017-07-22 DIAGNOSIS — G4733 Obstructive sleep apnea (adult) (pediatric): Secondary | ICD-10-CM

## 2017-07-22 NOTE — Telephone Encounter (Signed)
-----   Message from Laverle Hobby, MD sent at 07/22/2017  1:28 PM EDT ----- Regarding: CPAP titration results.  R E C O M M E N D A T I O N S .Auto-CPAP with range of 11-16 cmH2O .F&P Simplus FF, small

## 2017-07-25 NOTE — Telephone Encounter (Signed)
Patient aware of results. Orders placed. Patient aware compliance f/u needed.

## 2017-07-29 ENCOUNTER — Telehealth: Payer: Self-pay | Admitting: *Deleted

## 2017-07-29 DIAGNOSIS — G4733 Obstructive sleep apnea (adult) (pediatric): Secondary | ICD-10-CM | POA: Diagnosis not present

## 2017-07-29 NOTE — Telephone Encounter (Signed)
Pt informed of results on 07/22/17. Nothing further needed.

## 2017-07-29 NOTE — Telephone Encounter (Signed)
-----   Message from Laverle Hobby, MD sent at 07/29/2017  1:26 PM EDT ----- Regarding: CPAP titration  Recommend:  CPAP with pressure range of 12-16 cm H2O.

## 2017-08-03 ENCOUNTER — Other Ambulatory Visit: Payer: Self-pay

## 2017-08-03 ENCOUNTER — Encounter: Payer: Self-pay | Admitting: Emergency Medicine

## 2017-08-03 ENCOUNTER — Emergency Department: Payer: 59

## 2017-08-03 ENCOUNTER — Inpatient Hospital Stay
Admission: EM | Admit: 2017-08-03 | Discharge: 2017-08-06 | DRG: 291 | Disposition: A | Discharge status: Home / Self Care | Payer: 59 | Attending: Internal Medicine | Admitting: Internal Medicine

## 2017-08-03 DIAGNOSIS — E785 Hyperlipidemia, unspecified: Secondary | ICD-10-CM | POA: Diagnosis present

## 2017-08-03 DIAGNOSIS — J9621 Acute and chronic respiratory failure with hypoxia: Secondary | ICD-10-CM | POA: Diagnosis present

## 2017-08-03 DIAGNOSIS — I248 Other forms of acute ischemic heart disease: Secondary | ICD-10-CM | POA: Diagnosis present

## 2017-08-03 DIAGNOSIS — F419 Anxiety disorder, unspecified: Secondary | ICD-10-CM | POA: Diagnosis present

## 2017-08-03 DIAGNOSIS — I509 Heart failure, unspecified: Secondary | ICD-10-CM

## 2017-08-03 DIAGNOSIS — Z8249 Family history of ischemic heart disease and other diseases of the circulatory system: Secondary | ICD-10-CM

## 2017-08-03 DIAGNOSIS — Z66 Do not resuscitate: Secondary | ICD-10-CM | POA: Diagnosis present

## 2017-08-03 DIAGNOSIS — Z7952 Long term (current) use of systemic steroids: Secondary | ICD-10-CM

## 2017-08-03 DIAGNOSIS — R0902 Hypoxemia: Secondary | ICD-10-CM

## 2017-08-03 DIAGNOSIS — Z7982 Long term (current) use of aspirin: Secondary | ICD-10-CM | POA: Diagnosis not present

## 2017-08-03 DIAGNOSIS — I5043 Acute on chronic combined systolic (congestive) and diastolic (congestive) heart failure: Secondary | ICD-10-CM | POA: Diagnosis present

## 2017-08-03 DIAGNOSIS — I272 Pulmonary hypertension, unspecified: Secondary | ICD-10-CM | POA: Diagnosis present

## 2017-08-03 DIAGNOSIS — Z6836 Body mass index (BMI) 36.0-36.9, adult: Secondary | ICD-10-CM

## 2017-08-03 DIAGNOSIS — Z7984 Long term (current) use of oral hypoglycemic drugs: Secondary | ICD-10-CM | POA: Diagnosis not present

## 2017-08-03 DIAGNOSIS — J441 Chronic obstructive pulmonary disease with (acute) exacerbation: Secondary | ICD-10-CM | POA: Diagnosis present

## 2017-08-03 DIAGNOSIS — I2510 Atherosclerotic heart disease of native coronary artery without angina pectoris: Secondary | ICD-10-CM | POA: Diagnosis present

## 2017-08-03 DIAGNOSIS — Z91013 Allergy to seafood: Secondary | ICD-10-CM | POA: Diagnosis not present

## 2017-08-03 DIAGNOSIS — E1122 Type 2 diabetes mellitus with diabetic chronic kidney disease: Secondary | ICD-10-CM | POA: Diagnosis present

## 2017-08-03 DIAGNOSIS — Z9981 Dependence on supplemental oxygen: Secondary | ICD-10-CM

## 2017-08-03 DIAGNOSIS — N183 Chronic kidney disease, stage 3 (moderate): Secondary | ICD-10-CM | POA: Diagnosis present

## 2017-08-03 DIAGNOSIS — J810 Acute pulmonary edema: Secondary | ICD-10-CM | POA: Diagnosis present

## 2017-08-03 DIAGNOSIS — I130 Hypertensive heart and chronic kidney disease with heart failure and stage 1 through stage 4 chronic kidney disease, or unspecified chronic kidney disease: Principal | ICD-10-CM | POA: Diagnosis present

## 2017-08-03 DIAGNOSIS — Z888 Allergy status to other drugs, medicaments and biological substances status: Secondary | ICD-10-CM

## 2017-08-03 DIAGNOSIS — J9601 Acute respiratory failure with hypoxia: Secondary | ICD-10-CM | POA: Diagnosis present

## 2017-08-03 LAB — CBC
HCT: 34.2 % — ABNORMAL LOW (ref 35.0–47.0)
Hemoglobin: 10.7 g/dL — ABNORMAL LOW (ref 12.0–16.0)
MCH: 28 pg (ref 26.0–34.0)
MCHC: 31.2 g/dL — AB (ref 32.0–36.0)
MCV: 89.7 fL (ref 80.0–100.0)
PLATELETS: 362 10*3/uL (ref 150–440)
RBC: 3.82 MIL/uL (ref 3.80–5.20)
RDW: 18.6 % — ABNORMAL HIGH (ref 11.5–14.5)
WBC: 16.8 10*3/uL — ABNORMAL HIGH (ref 3.6–11.0)

## 2017-08-03 LAB — GLUCOSE, CAPILLARY
GLUCOSE-CAPILLARY: 218 mg/dL — AB (ref 65–99)
Glucose-Capillary: 296 mg/dL — ABNORMAL HIGH (ref 65–99)
Glucose-Capillary: 235 mg/dL — ABNORMAL HIGH (ref 65–99)
Glucose-Capillary: 174 mg/dL — ABNORMAL HIGH (ref 65–99)

## 2017-08-03 LAB — COMPREHENSIVE METABOLIC PANEL
ALT: 30 U/L (ref 14–54)
AST: 52 U/L — ABNORMAL HIGH (ref 15–41)
Albumin: 3.3 g/dL — ABNORMAL LOW (ref 3.5–5.0)
Alkaline Phosphatase: 60 U/L (ref 38–126)
Anion gap: 9 (ref 5–15)
BUN: 54 mg/dL — ABNORMAL HIGH (ref 6–20)
CALCIUM: 8.4 mg/dL — AB (ref 8.9–10.3)
CHLORIDE: 108 mmol/L (ref 101–111)
CO2: 23 mmol/L (ref 22–32)
CREATININE: 1.74 mg/dL — AB (ref 0.44–1.00)
GFR, EST AFRICAN AMERICAN: 32 mL/min — AB (ref >60)
GFR, EST NON AFRICAN AMERICAN: 28 mL/min — AB (ref >60)
Glucose, Bld: 216 mg/dL — ABNORMAL HIGH (ref 65–99)
Potassium: 4.6 mmol/L (ref 3.5–5.1)
Sodium: 140 mmol/L (ref 135–145)
Total Bilirubin: 0.6 mg/dL (ref 0.3–1.2)
Total Protein: 7.1 g/dL (ref 6.5–8.1)

## 2017-08-03 LAB — TROPONIN I
TROPONIN I: 0.07 ng/mL — AB (ref <0.03)
TROPONIN I: 0.11 ng/mL — AB (ref <0.03)
TROPONIN I: 0.12 ng/mL — AB (ref <0.03)
TROPONIN I: 0.14 ng/mL — AB (ref <0.03)

## 2017-08-03 LAB — BRAIN NATRIURETIC PEPTIDE: B NATRIURETIC PEPTIDE 5: 257 pg/mL — AB (ref 0.0–100.0)

## 2017-08-03 MED ORDER — LEVOCETIRIZINE DIHYDROCHLORIDE 5 MG PO TABS: 10.0000 mg | ORAL_TABLET | Freq: Every day | ORAL | Status: DC

## 2017-08-03 MED ORDER — ATORVASTATIN CALCIUM 20 MG PO TABS
40.0000 mg | ORAL_TABLET | Freq: Every day | ORAL | Status: DC
  Administered 2017-08-03 – 2017-08-05 (×3): 40 mg via ORAL
  Filled 2017-08-03 (×3): qty 2

## 2017-08-03 MED ORDER — INSULIN ASPART 100 UNIT/ML ~~LOC~~ SOLN
0.0000 [IU] | Freq: Every day | SUBCUTANEOUS | Status: DC
  Administered 2017-08-05: 2 [IU] via SUBCUTANEOUS
  Filled 2017-08-03: qty 1

## 2017-08-03 MED ORDER — ACETAMINOPHEN 325 MG PO TABS: 650.0000 mg | ORAL_TABLET | Freq: Four times a day (QID) | ORAL | Status: DC | PRN

## 2017-08-03 MED ORDER — ASPIRIN EC 81 MG PO TBEC
81.0000 mg | DELAYED_RELEASE_TABLET | Freq: Every day | ORAL | Status: DC
  Administered 2017-08-03 – 2017-08-06 (×4): 81 mg via ORAL
  Filled 2017-08-03 (×3): qty 1

## 2017-08-03 MED ORDER — ASPIRIN EC 81 MG PO TBEC
DELAYED_RELEASE_TABLET | ORAL | Status: AC
Start: 2017-08-03 — End: 2017-08-04
  Filled 2017-08-03: qty 1

## 2017-08-03 MED ORDER — BUDESONIDE 0.5 MG/2ML IN SUSP
0.5000 mg | Freq: Two times a day (BID) | RESPIRATORY_TRACT | Status: DC
  Administered 2017-08-03 – 2017-08-06 (×6): 0.5 mg via RESPIRATORY_TRACT
  Filled 2017-08-03 (×7): qty 2

## 2017-08-03 MED ORDER — ENOXAPARIN SODIUM 40 MG/0.4ML ~~LOC~~ SOLN
40.0000 mg | SUBCUTANEOUS | Status: DC
  Administered 2017-08-03: 40 mg via SUBCUTANEOUS
  Filled 2017-08-03: qty 0.4

## 2017-08-03 MED ORDER — ACETAMINOPHEN 650 MG RE SUPP: 650.0000 mg | Freq: Four times a day (QID) | RECTAL | Status: DC | PRN

## 2017-08-03 MED ORDER — IPRATROPIUM-ALBUTEROL 0.5-2.5 (3) MG/3ML IN SOLN
3.0000 mL | Freq: Four times a day (QID) | RESPIRATORY_TRACT | Status: DC
  Administered 2017-08-03 – 2017-08-05 (×7): 3 mL via RESPIRATORY_TRACT
  Filled 2017-08-03 (×8): qty 3

## 2017-08-03 MED ORDER — FUROSEMIDE 10 MG/ML IJ SOLN
20.0000 mg | Freq: Two times a day (BID) | INTRAMUSCULAR | Status: DC
  Administered 2017-08-03 – 2017-08-06 (×6): 20 mg via INTRAVENOUS
  Filled 2017-08-03 (×6): qty 2
  Filled 2017-08-03: qty 4

## 2017-08-03 MED ORDER — SENNOSIDES-DOCUSATE SODIUM 8.6-50 MG PO TABS
2.0000 | ORAL_TABLET | Freq: Two times a day (BID) | ORAL | Status: DC
  Administered 2017-08-03 – 2017-08-06 (×7): 2 via ORAL
  Filled 2017-08-03 (×7): qty 2

## 2017-08-03 MED ORDER — CARVEDILOL 6.25 MG PO TABS
6.2500 mg | ORAL_TABLET | Freq: Two times a day (BID) | ORAL | Status: DC
  Administered 2017-08-03 – 2017-08-06 (×4): 6.25 mg via ORAL
  Filled 2017-08-03 (×4): qty 1

## 2017-08-03 MED ORDER — CETIRIZINE HCL 10 MG PO TABS
10.0000 mg | ORAL_TABLET | Freq: Every day | ORAL | Status: DC
  Administered 2017-08-03 – 2017-08-06 (×4): 10 mg via ORAL
  Filled 2017-08-03 (×4): qty 1

## 2017-08-03 MED ORDER — ONDANSETRON HCL 4 MG/2ML IJ SOLN: 4.0000 mg | Freq: Four times a day (QID) | INTRAMUSCULAR | Status: DC | PRN

## 2017-08-03 MED ORDER — MONTELUKAST SODIUM 10 MG PO TABS
10.0000 mg | ORAL_TABLET | Freq: Every day | ORAL | Status: DC
  Administered 2017-08-03 – 2017-08-05 (×3): 10 mg via ORAL
  Filled 2017-08-03 (×4): qty 1

## 2017-08-03 MED ORDER — MIRTAZAPINE 15 MG PO TABS
45.0000 mg | ORAL_TABLET | Freq: Every day | ORAL | Status: DC
  Administered 2017-08-03 – 2017-08-05 (×3): 45 mg via ORAL
  Filled 2017-08-03 (×3): qty 3

## 2017-08-03 MED ORDER — ONDANSETRON HCL 4 MG PO TABS: 4.0000 mg | ORAL_TABLET | Freq: Four times a day (QID) | ORAL | Status: DC | PRN

## 2017-08-03 MED ORDER — HYDRALAZINE HCL 25 MG PO TABS
25.0000 mg | ORAL_TABLET | Freq: Three times a day (TID) | ORAL | Status: DC
  Administered 2017-08-03 – 2017-08-06 (×10): 25 mg via ORAL
  Filled 2017-08-03 (×10): qty 1

## 2017-08-03 MED ORDER — INSULIN ASPART 100 UNIT/ML ~~LOC~~ SOLN
0.0000 [IU] | Freq: Three times a day (TID) | SUBCUTANEOUS | Status: DC
  Administered 2017-08-03: 3 [IU] via SUBCUTANEOUS
  Administered 2017-08-03: 5 [IU] via SUBCUTANEOUS
  Administered 2017-08-04: 2 [IU] via SUBCUTANEOUS
  Administered 2017-08-04 – 2017-08-05 (×3): 1 [IU] via SUBCUTANEOUS
  Administered 2017-08-05: 5 [IU] via SUBCUTANEOUS
  Administered 2017-08-05: 3 [IU] via SUBCUTANEOUS
  Administered 2017-08-06 (×2): 1 [IU] via SUBCUTANEOUS
  Filled 2017-08-03 (×10): qty 1

## 2017-08-03 MED ORDER — ALPRAZOLAM 0.25 MG PO TABS
0.2500 mg | ORAL_TABLET | Freq: Three times a day (TID) | ORAL | Status: DC
  Administered 2017-08-04 – 2017-08-05 (×2): 0.25 mg via ORAL
  Filled 2017-08-03 (×5): qty 1

## 2017-08-03 MED ORDER — ATORVASTATIN CALCIUM 20 MG PO TABS
40.0000 mg | ORAL_TABLET | Freq: Every day | ORAL | Status: DC
  Filled 2017-08-03: qty 2

## 2017-08-03 MED ORDER — METHYLPREDNISOLONE SODIUM SUCC 125 MG IJ SOLR
125.0000 mg | Freq: Once | INTRAMUSCULAR | Status: AC
  Administered 2017-08-03: 125 mg via INTRAVENOUS

## 2017-08-03 MED ORDER — ENOXAPARIN SODIUM 30 MG/0.3ML ~~LOC~~ SOLN: 30.0000 mg | SUBCUTANEOUS | Status: DC

## 2017-08-03 MED ORDER — FUROSEMIDE 10 MG/ML IJ SOLN
60.0000 mg | Freq: Once | INTRAMUSCULAR | Status: AC
  Administered 2017-08-03: 60 mg via INTRAVENOUS
  Filled 2017-08-03: qty 8

## 2017-08-03 MED ORDER — METHYLPREDNISOLONE SODIUM SUCC 125 MG IJ SOLR
INTRAMUSCULAR | Status: AC
  Administered 2017-08-03: 125 mg via INTRAVENOUS
  Filled 2017-08-03: qty 2

## 2017-08-03 MED ORDER — SPIRONOLACTONE 25 MG PO TABS
12.5000 mg | ORAL_TABLET | Freq: Every day | ORAL | Status: DC
  Administered 2017-08-03 – 2017-08-06 (×4): 12.5 mg via ORAL
  Filled 2017-08-03 (×4): qty 1

## 2017-08-03 NOTE — ED Notes (Signed)
Pt given cup of diet ginger ale.

## 2017-08-03 NOTE — Progress Notes (Signed)
Pt refusing to have bed alarm set. Educated on the reason and importance of bed alarm. Also reinforced the reason that pt had had a fall at home. Pt still refusing to wear bed alarm stating that when she has to use bathroom, that she has to get up and go.  Will continue to monitor and assess.

## 2017-08-03 NOTE — ED Notes (Signed)
Admitting MD at bedside at this time.

## 2017-08-03 NOTE — ED Notes (Signed)
Date and time results received: 08/03/17 8:04 AM   Test: Trop Critical Value: 0.07  Name of Provider Notified: Dr. Kerman Passey  Orders Received? Or Actions Taken?: Critical Results Acknowledged

## 2017-08-03 NOTE — ED Notes (Signed)
Pt taken off BiPap at this time, placed on 3L via [place]. Per Dr. Verdell Carmine, pt is okay to eat and drink at this time.

## 2017-08-03 NOTE — Progress Notes (Signed)
Pt arrived from ER at shift change.

## 2017-08-03 NOTE — ED Notes (Signed)
Pt transported to room 249

## 2017-08-03 NOTE — H&P (Addendum)
[facility] NAME: Angela Pham    MR#:  [id number]  DATE OF BIRTH:  [birth year]  DATE OF ADMISSION:  08/03/2017  PRIMARY CARE PHYSICIAN: Cletis Athens, MD   REQUESTING/REFERRING PHYSICIAN: Dr. Harvest Dark  CHIEF COMPLAINT:   Chief Complaint  Patient presents with  . Respiratory Distress  Shortness of breath  HISTORY OF PRESENT ILLNESS:  Angela Pham  is a 76 y.o. female with a known history of chronic combined systolic/diastolic CHF, COPD, obesity, hypertension, hyperlipidemia, diabetes who presents to the hospital due to shortness of breath. Patient says she was in her usual state of health until this morning around 6 AM she developed some shortness of breath. She woke up to use the bathroom and shortly after became significantly short of breath. She attempted using nebulizers but did not improve her shortness of breath and therefore she called EMS and she was brought to the hospital. Patient received 2 albuterol treatments on route to the hospital and was noted to be in acute on chronic respiratory failure with hypoxia. She was placed on BiPAP on arrival to the ER. Chest x-ray findings was just some mild CHF. Hospitalist services were contacted further treatment and evaluation. Patient denies any cough, congestion, fever, chills, nausea, vomiting, chest pain, paroxysmal nocturnal dyspnea, orthopnea, lower extremity edema or any other associated symptoms presently. She also denies any sick contacts.  PAST MEDICAL HISTORY:   Past Medical History:  Diagnosis Date  . Asthma   . Chronic combined systolic and diastolic CHF (congestive heart failure) ([place])    a. TTE 02/2017: EF 45-50%, basal amd midanteroseptal HK b. 05/2017: echo showing EF of 35-40%, mild AI, moderate MR, and mildly dilated LA.   Marland Kitchen Coronary artery disease, non-occlusive    a. LHC 02/2017 normal coronary arteries  . Hyperlipidemia   . Hypertension   . Morbid obesity  ([place])   . Pulmonary hypertension ([place])   . Type II diabetes mellitus ([place])     PAST SURGICAL HISTORY:   Past Surgical History:  Procedure Laterality Date  . c-section      SOCIAL HISTORY:   Social History   Tobacco Use  . Smoking status: Former Smoker    Packs/day: 0.30    Years: 20.00    Pack years: 6.00    Types: Cigarettes  . Smokeless tobacco: Never Used  Substance Use Topics  . Alcohol use: No    FAMILY HISTORY:   Family History  Problem Relation Age of Onset  . CAD Father   . Colon cancer Sister   . Leukemia Brother   . Throat cancer Brother   . Cervical cancer Sister     DRUG ALLERGIES:   Allergies  Allergen Reactions  . Ace Inhibitors Anaphylaxis    Tongue swelling  . Shellfish Allergy Hives and Swelling    Swelling around face and mouth     REVIEW OF SYSTEMS:   Review of Systems  Constitutional: Negative for chills and fever.  HENT: Negative for congestion and tinnitus.   Eyes: Negative for blurred vision and double vision.  Respiratory: Positive for shortness of breath. Negative for cough and wheezing.   Cardiovascular: Negative for chest pain, orthopnea and PND.  Gastrointestinal: Negative for abdominal pain, diarrhea, nausea and vomiting.  Genitourinary: Negative for dysuria and hematuria.  Neurological: Negative for dizziness, sensory change and focal weakness.  All other systems reviewed and are negative.   MEDICATIONS AT HOME:   Prior to Admission  medications   Medication Sig Start Date End Date Taking? Authorizing Provider  ALPRAZolam (XANAX) 0.25 MG tablet Take 1 tablet 3 (three) times daily by mouth. 06/10/17  Yes [provider]  aspirin EC 81 MG EC tablet Take 1 tablet (81 mg total) by mouth daily. 03/12/17  Yes Mody, Ulice Bold, MD  atorvastatin (LIPITOR) 40 MG tablet Take 40 mg by mouth daily.   Yes [provider]  carvedilol (COREG) 6.25 MG tablet Take 1 tablet (6.25 mg total) by mouth 2 (two) times daily.  06/23/17  Yes Strader, [place] M, PA-C  hydrALAZINE (APRESOLINE) 25 MG tablet Take 1 tablet (25 mg total) by mouth every 8 (eight) hours. 05/08/17  Yes Vaughan Basta, MD  levocetirizine (XYZAL) 5 MG tablet Take 10 mg by mouth daily. 03/18/17  Yes [provider]  metFORMIN (GLUCOPHAGE) 850 MG tablet Take 1 tablet by mouth 2 (two) times daily.   Yes [provider]  mirtazapine (REMERON) 45 MG tablet Take 1 tablet by mouth at bedtime.   Yes [provider]  montelukast (SINGULAIR) 10 MG tablet Take 1 tablet (10 mg total) by mouth at bedtime. 03/11/17  Yes Mody, Ulice Bold, MD  Multiple Vitamins-Minerals (EYE VITAMINS PO) Take 1 tablet by mouth 2 (two) times daily.    Yes [provider]  spironolactone (ALDACTONE) 25 MG tablet Take 0.5 tablets (12.5 mg total) by mouth daily. 06/23/17  Yes Strader, [place], PA-C  tiotropium (SPIRIVA HANDIHALER) 18 MCG inhalation capsule Place 1 capsule (18 mcg total) into inhaler and inhale daily. 07/04/17 07/04/18 Yes Sudini, Alveta Heimlich, MD  torsemide (DEMADEX) 20 MG tablet Take one tablet daily. May take one extra tablet daily for weight gain >3lbs overnight or >5lbs in a week. Patient taking differently: 20-40 mg. Take one tablet daily. May take one extra tablet daily for weight gain >3lbs overnight or >5lbs in a week. 06/24/17  Yes Strader, [place] M, PA-C  arformoterol (BROVANA) 15 MCG/2ML NEBU Take 2 mLs (15 mcg total) by nebulization 2 (two) times daily. Patient not taking: Reported on 07/06/2017 07/06/17   Wilhelmina Mcardle, MD  budesonide (PULMICORT) 0.25 MG/2ML nebulizer solution Take 2 mLs (0.25 mg total) by nebulization 2 (two) times daily. 07/04/17   Hillary Bow, MD  Ipratropium-Albuterol (COMBIVENT IN) Inhale 3 puffs into the lungs every 4 (four) hours as needed.    [provider]  ipratropium-albuterol (DUONEB) 0.5-2.5 (3) MG/3ML SOLN Inhale 3 mLs into the lungs every 4 (four) hours as needed.    [provider]  predniSONE (STERAPRED UNI-PAK 21 TAB) 10 MG (21) TBPK tablet 60 mg on day 1  And taper 10 mg daily Patient not taking: Reported on 08/03/2017 07/04/17   Hillary Bow, MD  senna-docusate (SENOKOT-S) 8.6-50 MG tablet Take 2 tablets by mouth 2 (two) times daily. 07/04/17   Hillary Bow, MD      VITAL SIGNS:  Blood pressure 124/66, pulse (!) 103, temperature 97.9 F (36.6 C), temperature source Axillary, resp. rate 20, height 4\' 11"  (1.499 m), weight 79.8 kg (176 lb), SpO2 100 %.  PHYSICAL EXAMINATION:  Physical Exam  GENERAL:  76 y.o.-year-old patient lying in the bed in mild Resp. Distress but talking in full sentences.  EYES: Pupils equal, round, reactive to light and accommodation. No scleral icterus. Extraocular muscles intact.  HEENT: Head atraumatic, normocephalic. Oropharynx and nasopharynx clear. No oropharyngeal erythema, moist oral mucosa  NECK:  Supple, no jugular venous distention. No thyroid enlargement, no tenderness.  LUNGS: Good air  entry bilaterally, diffuse rhonchi, wheezing, minimal rales at bases. Negative use of accessory muscles. CARDIOVASCULAR: S1, S2 RRR. No murmurs, rubs, gallops, clicks.  ABDOMEN: Soft, nontender, nondistended. Bowel sounds present. No organomegaly or mass.  EXTREMITIES: + 1 pedal edema b/l, cyanosis, or clubbing. + 2 pedal & radial pulses b/l.   NEUROLOGIC: Cranial nerves II through XII are intact. No focal Motor or sensory deficits appreciated b/l PSYCHIATRIC: The patient is alert and oriented x 3.  SKIN: No obvious rash, lesion, or ulcer.   LABORATORY PANEL:   CBC Recent Labs  Lab 08/03/17 0702  WBC 16.8*  HGB 10.7*  HCT 34.2*  PLT 362   ------------------------------------------------------------------------------------------------------------------  Chemistries  Recent Labs  Lab 08/03/17 0702  NA 140  K 4.6  CL 108  CO2 23  GLUCOSE 216*  BUN 54*  CREATININE 1.74*  CALCIUM 8.4*  AST 52*  ALT 30  ALKPHOS  60  BILITOT 0.6   ------------------------------------------------------------------------------------------------------------------  Cardiac Enzymes Recent Labs  Lab 08/03/17 0702  TROPONINI 0.07*   ------------------------------------------------------------------------------------------------------------------  RADIOLOGY:  Dg Chest Portable 1 View  Result Date: 08/03/2017 CLINICAL DATA:  Respiratory distress. History of asthma -CHF, coronary artery disease, pulmonary hypertension, morbid obesity. EXAM: PORTABLE CHEST 1 VIEW COMPARISON:  Chest x-ray of June 27, 2017 FINDINGS: The lungs are less well inflated today. The interstitial markings are increased greatest in the lower lung zones. The cardiac silhouette is enlarged. The pulmonary vascularity is mildly engorged. There is no pleural effusion or pneumothorax. The bony thorax is unremarkable. IMPRESSION: CHF with mild interstitial edema. Probable bibasilar subsegmental atelectasis. Electronically Signed   By: David  Martinique M.D.   On: 08/03/2017 07:57     IMPRESSION AND PLAN:   76 year old female with past medical history of diabetes, hypertension, hyperlipidemia, history of chronic combined systolic/diastolic CHF, COPD who presents to the hospital due to shortness of breath.  1. Acute on chronic respiratory failure with hypoxia-secondary to COPD exacerbation and also CHF. -We'll diurese the patient with IV Lasix, follow I's and O's and daily weights. -We'll also give patient IV steroids with COPD along with scheduled DuoNeb's, Pulmicort nebs. Patient has been weaned off BiPAP in the ER will continue O2 supplementation for now.  2. COPD exacerbation-we'll treat patient with IV steroids, scheduled DuoNeb's, Pulmicort nebs. -Patient is on oxygen at home.  3. CHF-acute on chronic combined diastolic/systolic CHF. -We'll diurese the patient with IV Lasix, follow I's and O's and daily weights. -Continue carvedilol, hydralazine,  Aldactone.  4. Chronic kidney disease stage III-creatinine is close to baseline. We'll follow with diuresis.  5. Diabetes type 2 without complication-hold metformin, Place on sliding scale insulin.  6. Anxiety-we'll place on Xanax as needed.  7. Hyperlipidemia-continue atorvastatin.  8. Elevated troponin-secondary to supply demand ischemia from hypoxemia with CHF and COPD. We'll observe on telemetry, cycle her cardiac markers.  All the records are reviewed and case discussed with ED provider. Management plans discussed with the patient, family and they are in agreement.  CODE STATUS: Full code  TOTAL TIME TAKING CARE OF THIS PATIENT: 45 minutes.    Henreitta Leber M.D on 08/03/2017 at 10:43 AM  Between 7am to 6pm - Pager - [phone]  After 6pm go to [email or web address] - password [email or web address] [facility]  Office  [phone]  CC: Primary care physician; Cletis Athens, MD

## 2017-08-03 NOTE — ED Triage Notes (Signed)
Pt presents to ED via [facility]. Per EMS, called for respiratory distress, pt had 3 albuterol treatments PTA, was receiving 1 duoneb upon their arrival, received 1 more duoneb en route. EMS reports pt requested CPAP en route, pt arrives on CPAP. EMS reports HR 140-150, BP 147/61. EMS reports O2 saturations 60% on NRB.

## 2017-08-03 NOTE — Progress Notes (Signed)
In to see Pt. Pt A&O. Telemetry box applied. Oriented pt to room, call bell system and ascom system.

## 2017-08-03 NOTE — ED Provider Notes (Addendum)
[facility] Emergency Department Provider Note  Time seen: 7:23 AM  I have reviewed the triage vital signs and the nursing notes.   HISTORY  Chief Complaint Respiratory Distress    HPI Angela Pham is a 76 y.o. female with a past medical history of COPD, asthma, CHF, hypertension, hyperlipidemia, diabetes, presents to the emergency department with difficulty breathing.  According to the patient and EMS difficulty breathing began overnight had progressively worsened this morning.  EMS states initial saturation of 60% on supplemental oxygen.  EMS gave 2 breathing treatments and CPAP.  Upon arrival the patient is satting 100% on CPAP.  Patient states shortness of breath, denies any chest pain.  No significant swelling/edema.  No leg pain or abdominal pain.  Past Medical History:  Diagnosis Date  . Asthma   . Chronic combined systolic and diastolic CHF (congestive heart failure) ([place])    a. TTE 02/2017: EF 45-50%, basal amd midanteroseptal HK b. 05/2017: echo showing EF of 35-40%, mild AI, moderate MR, and mildly dilated LA.   Marland Kitchen Coronary artery disease, non-occlusive    a. LHC 02/2017 normal coronary arteries  . Hyperlipidemia   . Hypertension   . Morbid obesity ([place])   . Pulmonary hypertension ([place])   . Type II diabetes mellitus [facility])     Patient Active Problem List   Diagnosis Date Noted  . DNR (do not resuscitate) 07/02/2017  . Respiratory failure ([place]) 06/27/2017  . Chronic diastolic heart failure ([place]) 06/24/2017  . Asthma 06/24/2017  . Diabetes ([place]) 06/24/2017  . Essential hypertension 06/23/2017  . Hyperlipidemia, unspecified 06/23/2017  . CKD (chronic kidney disease) stage 3, GFR 30-59 ml/min ([facility]) 06/23/2017  . Pressure injury of skin 06/12/2017  . COPD exacerbation ([place]) 05/28/2017  . SOB (shortness of breath)   . Chronic combined systolic and diastolic heart failure ([place]) 03/07/2017    Past Surgical History:  Procedure Laterality Date  .  c-section      Prior to Admission medications   Medication Sig Start Date End Date Taking? Authorizing Provider  arformoterol (BROVANA) 15 MCG/2ML NEBU Take 2 mLs (15 mcg total) by nebulization 2 (two) times daily. Patient not taking: Reported on 07/06/2017 07/06/17   Wilhelmina Mcardle, MD  aspirin EC 81 MG EC tablet Take 1 tablet (81 mg total) by mouth daily. 03/12/17   [name], MD  atorvastatin (LIPITOR) 40 MG tablet Take 40 mg by mouth daily.    [provider]  budesonide (PULMICORT) 0.25 MG/2ML nebulizer solution Take 2 mLs (0.25 mg total) by nebulization 2 (two) times daily. 07/04/17   Hillary Bow, MD  carvedilol (COREG) 6.25 MG tablet Take 1 tablet (6.25 mg total) by mouth 2 (two) times daily. 06/23/17   Strader, Fransisco Hertz, PA-C  hydrALAZINE (APRESOLINE) 25 MG tablet Take 1 tablet (25 mg total) by mouth every 8 (eight) hours. Patient taking differently: Take 25 mg by mouth 3 (three) times daily.  05/08/17   Vaughan Basta, MD  Ipratropium-Albuterol (COMBIVENT IN) Inhale 3 puffs into the lungs every 4 (four) hours as needed.    [provider]  ipratropium-albuterol (DUONEB) 0.5-2.5 (3) MG/3ML SOLN Inhale 3 mLs into the lungs every 4 (four) hours as needed.    [provider]  levocetirizine (XYZAL) 5 MG tablet Take 10 mg by mouth daily. 03/18/17   [provider]  metFORMIN (GLUCOPHAGE) 850 MG tablet Take 1 tablet by mouth 2 (two) times daily.    [provider]  mirtazapine (REMERON)  45 MG tablet Take 1 tablet by mouth at bedtime.    [provider]  montelukast (SINGULAIR) 10 MG tablet Take 1 tablet (10 mg total) by mouth at bedtime. 03/11/17   [name], MD  Multiple Vitamins-Minerals (EYE VITAMINS PO) Take 1 tablet by mouth 2 (two) times daily.     [provider]  predniSONE (STERAPRED UNI-PAK 21 TAB) 10 MG (21) TBPK tablet 60 mg on day 1  And taper 10 mg daily 07/04/17   Sudini, Alveta Heimlich, MD  senna-docusate  (SENOKOT-S) 8.6-50 MG tablet Take 2 tablets by mouth 2 (two) times daily. 07/04/17   Hillary Bow, MD  spironolactone (ALDACTONE) 25 MG tablet Take 0.5 tablets (12.5 mg total) by mouth daily. 06/23/17   Strader, Fransisco Hertz, PA-C  tiotropium (SPIRIVA HANDIHALER) 18 MCG inhalation capsule Place 1 capsule (18 mcg total) into inhaler and inhale daily. 07/04/17 07/04/18  Hillary Bow, MD  torsemide (DEMADEX) 20 MG tablet Take one tablet daily. May take one extra tablet daily for weight gain >3lbs overnight or >5lbs in a week. 06/24/17   Ahmed Prima, Fransisco Hertz, PA-C    Allergies  Allergen Reactions  . Ace Inhibitors Anaphylaxis    Tongue swelling  . Shellfish Allergy Hives and Swelling    Swelling around face and mouth     Family History  Problem Relation Age of Onset  . CAD Father   . Colon cancer Sister   . Leukemia Brother   . Throat cancer Brother   . Cervical cancer Sister     Social History Social History   Tobacco Use  . Smoking status: Never Smoker  . Smokeless tobacco: Never Used  Substance Use Topics  . Alcohol use: No  . Drug use: No    Review of Systems Constitutional: Negative for fever. Cardiovascular: Negative for chest pain. Respiratory: Negative for shortness of breath. Gastrointestinal: Negative for abdominal pain, vomiting and diarrhea. Genitourinary: Negative for dysuria. Musculoskeletal: Negative for back pain. Skin: Negative for rash. Neurological: Negative for headache All other ROS negative  ____________________________________________   PHYSICAL EXAM:  VITAL SIGNS: ED Triage Vitals  Enc Vitals Group     BP 08/03/17 0707 (!) 163/93     Pulse Rate 08/03/17 0707 (!) 120     Resp 08/03/17 0707 (!) 25     Temp 08/03/17 0707 97.9 F (36.6 C)     Temp Source 08/03/17 0707 Axillary     SpO2 08/03/17 0705 (!) 60 %     Weight 08/03/17 0709 176 lb (79.8 kg)     Height 08/03/17 0709 4\' 11"  (1.499 m)     Head Circumference --      Peak Flow --       Pain Score --      Pain Loc --      Pain Edu? --      Excl. in [place]? --    Constitutional: Alert and oriented.  Mild distress sitting upright in bed, much more calm on BiPAP. Eyes: Normal exam ENT   Head: Normocephalic and atraumatic   Mouth/Throat: Mucous membranes are moist. Cardiovascular: Regular rhythm, rate around 100 bpm. Respiratory: Moderate tachypnea, breath sounds are decreased bilaterally with diminished air movement, no obvious wheeze rales or rhonchi. Gastrointestinal: Soft and nontender. No distention. Musculoskeletal: Nontender with normal range of motion in all extremities. No lower extremity tenderness or edema. Neurologic:  Normal speech and language. No gross focal neurologic deficits  Skin: Skin is somewhat cool to the touch, dry. Psychiatric: Mood  and affect are normal.  ____________________________________________    EKG  EKG reviewed and interpreted by myself shows sinus tachycardia at 130 bpm with a slightly widened QRS, largely normal axis, largely normal intervals with nonspecific ST changes.  Occasional PAC.  ____________________________________________    RADIOLOGY  X-ray shows interstitial edema  ____________________________________________   INITIAL IMPRESSION / ASSESSMENT AND PLAN / ED COURSE  Pertinent labs & imaging results that were available during my care of the patient were reviewed by me and considered in my medical decision making (see chart for details).  Patient presents to the emergency department for shortness of breath.  Differential would include asthma, COPD, CHF, ACS, pneumonia, pneumothorax.  We will check labs including cardiac enzymes, chest x-ray, we will continue BiPAP treatment in the emergency department.  Patient dose 125 mg of Solu-Medrol.  Patient received 3 albuterol nebulizer treatments prior to EMS arrival, 2 DuoNeb treatments by EMS, we will hold off on any further breathing treatments at this time as the  patient appears much more comfortable on BiPAP breathing calmly.  I have reviewed the patient's records including recent discharge summary 07/04/17 after an admission for respiratory failure thought to be due to COPD.  As well as a discharge summary from 06/16/17 for respiratory issues and renal insufficiency.  X-ray shows interstitial edema.  Troponin slightly elevated, BNP elevated.  We will dose IV Lasix.  Patient is sleeping comfortably on BiPAP with a 100% saturation.  We will attempt to wean off of BiPAP.  Patient will require admission to the hospital for further treatment.  CRITICAL CARE Performed by: Harvest Dark   Total critical care time: 30 minutes  Critical care time was exclusive of separately billable procedures and treating other patients.  Critical care was necessary to treat or prevent imminent or life-threatening deterioration.  Critical care was time spent personally by me on the following activities: development of treatment plan with patient and/or surrogate as well as nursing, discussions with consultants, evaluation of patient's response to treatment, examination of patient, obtaining history from patient or surrogate, ordering and performing treatments and interventions, ordering and review of laboratory studies, ordering and review of radiographic studies, pulse oximetry and re-evaluation of patient's condition.   ____________________________________________   FINAL CLINICAL IMPRESSION(S) / ED DIAGNOSES  Dyspnea CHF exacerbation   Harvest Dark, MD 08/03/17 8250    Harvest Dark, MD 08/03/17 [phone]

## 2017-08-03 NOTE — Progress Notes (Signed)
Changed lovenox dose from 40mg  Q24H to 30mg  Q24H due to BMI < 40 and CrCl < 30.   Lendon Ka, PharmD Pharmacy Resident

## 2017-08-03 NOTE — Progress Notes (Signed)
bipap placed at bedside. Verified with patient, md order is to use at night. Patient states she was taken off in ed and she is breathing fine without it. Patient will let RN know if she wants to use tonight so that RT can place her on unit

## 2017-08-03 NOTE — Care Management (Signed)
RNCM to follow this case. Fifth presentation to hospital in last 6 months despite sleep study arrangements (completed), medication assistance, noctural supplemental O2, and [facility] that patient fired due to too frequent visits from Home health agency. Patient is from home with her niece.

## 2017-08-04 LAB — BASIC METABOLIC PANEL
ANION GAP: 9 (ref 5–15)
BUN: 52 mg/dL — ABNORMAL HIGH (ref 6–20)
CHLORIDE: 107 mmol/L (ref 101–111)
CO2: 26 mmol/L (ref 22–32)
CREATININE: 1.54 mg/dL — AB (ref 0.44–1.00)
Calcium: 8 mg/dL — ABNORMAL LOW (ref 8.9–10.3)
GFR calc non Af Amer: 32 mL/min — ABNORMAL LOW (ref >60)
GFR, EST AFRICAN AMERICAN: 37 mL/min — AB (ref >60)
Glucose, Bld: 150 mg/dL — ABNORMAL HIGH (ref 65–99)
Potassium: 3.8 mmol/L (ref 3.5–5.1)
Sodium: 142 mmol/L (ref 135–145)

## 2017-08-04 LAB — CBC
HEMATOCRIT: 27.2 % — AB (ref 35.0–47.0)
HEMOGLOBIN: 8.7 g/dL — AB (ref 12.0–16.0)
MCH: 28.4 pg (ref 26.0–34.0)
MCHC: 32.1 g/dL (ref 32.0–36.0)
MCV: 88.2 fL (ref 80.0–100.0)
Platelets: 299 10*3/uL (ref 150–440)
RBC: 3.08 MIL/uL — AB (ref 3.80–5.20)
RDW: 18 % — ABNORMAL HIGH (ref 11.5–14.5)
WBC: 9.4 10*3/uL (ref 3.6–11.0)

## 2017-08-04 LAB — GLUCOSE, CAPILLARY
GLUCOSE-CAPILLARY: 146 mg/dL — AB (ref 65–99)
GLUCOSE-CAPILLARY: 159 mg/dL — AB (ref 65–99)
GLUCOSE-CAPILLARY: 177 mg/dL — AB (ref 65–99)
Glucose-Capillary: 142 mg/dL — ABNORMAL HIGH (ref 65–99)

## 2017-08-04 MED ORDER — TIOTROPIUM BROMIDE MONOHYDRATE 18 MCG IN CAPS
18.0000 ug | ORAL_CAPSULE | Freq: Every day | RESPIRATORY_TRACT | Status: DC
  Administered 2017-08-04 – 2017-08-06 (×3): 18 ug via RESPIRATORY_TRACT
  Filled 2017-08-04: qty 5

## 2017-08-04 MED ORDER — METHYLPREDNISOLONE SODIUM SUCC 40 MG IJ SOLR
40.0000 mg | Freq: Two times a day (BID) | INTRAMUSCULAR | Status: DC
  Administered 2017-08-04 – 2017-08-05 (×2): 40 mg via INTRAVENOUS
  Filled 2017-08-04 (×2): qty 1

## 2017-08-04 MED ORDER — ADULT MULTIVITAMIN W/MINERALS CH
1.0000 | ORAL_TABLET | Freq: Every day | ORAL | Status: DC
  Administered 2017-08-04 – 2017-08-06 (×3): 1 via ORAL
  Filled 2017-08-04 (×3): qty 1

## 2017-08-04 NOTE — Progress Notes (Signed)
dECREASED TO 2L

## 2017-08-04 NOTE — Plan of Care (Signed)
Refused bed alarm.  

## 2017-08-04 NOTE — Progress Notes (Signed)
[place] [facility] [place] NAME: Angela Pham    MR#:  [id number]  DATE OF BIRTH:  1941-05-22  SUBJECTIVE:  CHIEF COMPLAINT:   Chief Complaint  Patient presents with  . Respiratory Distress   A little better of shortness of breath and cough, oxygen [place] 3L REVIEW OF SYSTEMS:  Review of Systems  Constitutional: Positive for malaise/fatigue. Negative for chills and fever.  HENT: Negative for sore throat.   Eyes: Negative for blurred vision and double vision.  Respiratory: Positive for cough and shortness of breath. Negative for hemoptysis, wheezing and stridor.   Cardiovascular: Negative for chest pain, palpitations, orthopnea and leg swelling.  Gastrointestinal: Negative for abdominal pain, blood in stool, diarrhea, melena, nausea and vomiting.  Genitourinary: Negative for dysuria, flank pain and hematuria.  Musculoskeletal: Negative for back pain and joint pain.  Skin: Negative for rash.  Neurological: Negative for dizziness, sensory change, focal weakness, seizures, loss of consciousness, weakness and headaches.  Endo/Heme/Allergies: Negative for polydipsia.  Psychiatric/Behavioral: Negative for depression. The patient is not nervous/anxious.     DRUG ALLERGIES:   Allergies  Allergen Reactions  . Ace Inhibitors Anaphylaxis    Tongue swelling  . Shellfish Allergy Hives and Swelling    Swelling around face and mouth    VITALS:  Blood pressure (!) 116/58, pulse (!) 104, temperature (!) 97.4 F (36.3 C), temperature source Oral, resp. rate 17, height 4\' 11"  (1.499 m), weight 178 lb 4.8 oz (80.9 kg), SpO2 98 %. PHYSICAL EXAMINATION:  Physical Exam  Constitutional: She is oriented to person, place, and time and well-developed, well-nourished, and in no distress.  HENT:  Head: Normocephalic.  Mouth/Throat: Oropharynx is clear and moist.  Eyes: Conjunctivae and EOM are normal. Pupils are equal, round, and reactive to light. No scleral  icterus.  Neck: Normal range of motion. Neck supple. No JVD present. No tracheal deviation present.  Cardiovascular: Normal rate, regular rhythm and normal heart sounds. Exam reveals no gallop.  No murmur heard. Pulmonary/Chest: Effort normal. No respiratory distress. She has no wheezes. She has rales.  Abdominal: Soft. Bowel sounds are normal. She exhibits no distension. There is no tenderness. There is no rebound.  Musculoskeletal: Normal range of motion. She exhibits no edema or tenderness.  Neurological: She is alert and oriented to person, place, and time. No cranial nerve deficit.  Skin: No rash noted. No erythema.  Psychiatric: Affect normal.   LABORATORY PANEL:  Female CBC Recent Labs  Lab 08/04/17 0503  WBC 9.4  HGB 8.7*  HCT 27.2*  PLT 299   ------------------------------------------------------------------------------------------------------------------ Chemistries  Recent Labs  Lab 08/03/17 0702 08/04/17 0503  NA 140 142  K 4.6 3.8  CL 108 107  CO2 23 26  GLUCOSE 216* 150*  BUN 54* 52*  CREATININE 1.74* 1.54*  CALCIUM 8.4* 8.0*  AST 52*  --   ALT 30  --   ALKPHOS 60  --   BILITOT 0.6  --    RADIOLOGY:  No results found. ASSESSMENT AND PLAN:    76 year old female with past medical history of diabetes, hypertension, hyperlipidemia, history of chronic combined systolic/diastolic CHF, COPD who presents to the hospital due to shortness of breath.  1. Acute on chronic respiratory failure with hypoxia-secondary to COPD exacerbation and also CHF. Continue IV Lasix and IV steroids along with scheduled DuoNeb's, Pulmicort nebs. P off BiPAP, try to wean down oxygen.  2. COPD exacerbation Continue IV steroids, scheduled DuoNeb's, Pulmicort nebs. -Patient  is on oxygen 2 L at home.  3. CHF-acute on chronic combined diastolic/systolic CHF. Continue IV Lasix, follow I's and O's and daily weights. -Continue carvedilol, hydralazine, Aldactone.  4. Chronic  kidney disease stage III-creatinine is close to baseline.  Follow-up with BMP.  5. Diabetes type 2 without complication-hold metformin, Place on sliding scale insulin.  6. Anxiety- on Xanax as needed.  7. Hyperlipidemia-continue atorvastatin.  8. Elevated troponin-secondary to supply demand ischemia from hypoxemia with CHF and COPD.  Continue telemetry monitor.  All the records are reviewed and case discussed with Care Management/Social Worker. Management plans discussed with the patient, family and they are in agreement.  CODE STATUS: Full Code  TOTAL TIME TAKING CARE OF THIS PATIENT: 37 minutes.   More than 50% of the time was spent in counseling/coordination of care: YES  POSSIBLE D/C IN 2 DAYS, DEPENDING ON CLINICAL CONDITION.   Demetrios Loll M.D on 08/04/2017 at 4:09 PM  Between 7am to 6pm - Pager - [phone]  After 6pm go to [email or web address] - [facility]

## 2017-08-04 NOTE — Progress Notes (Signed)
svn given. Patient has been placed on bipap with 3liters o2 due to sob. Tolerating bipap well

## 2017-08-05 ENCOUNTER — Ambulatory Visit: Payer: 59 | Admitting: Pulmonary Disease

## 2017-08-05 ENCOUNTER — Ambulatory Visit: Payer: Medicare Other | Admitting: Cardiovascular Disease

## 2017-08-05 LAB — GLUCOSE, CAPILLARY
GLUCOSE-CAPILLARY: 249 mg/dL — AB (ref 65–99)
GLUCOSE-CAPILLARY: 267 mg/dL — AB (ref 65–99)
GLUCOSE-CAPILLARY: 224 mg/dL — AB (ref 65–99)
Glucose-Capillary: 136 mg/dL — ABNORMAL HIGH (ref 65–99)

## 2017-08-05 MED ORDER — ARFORMOTEROL TARTRATE 15 MCG/2ML IN NEBU
15.0000 ug | INHALATION_SOLUTION | Freq: Two times a day (BID) | RESPIRATORY_TRACT | Status: DC
  Administered 2017-08-05 – 2017-08-06 (×3): 15 ug via RESPIRATORY_TRACT
  Filled 2017-08-05 (×4): qty 2

## 2017-08-05 MED ORDER — PREDNISONE 50 MG PO TABS
50.0000 mg | ORAL_TABLET | Freq: Every day | ORAL | Status: DC
  Administered 2017-08-05 – 2017-08-06 (×2): 50 mg via ORAL
  Filled 2017-08-05 (×2): qty 1

## 2017-08-05 MED ORDER — IPRATROPIUM-ALBUTEROL 0.5-2.5 (3) MG/3ML IN SOLN
3.0000 mL | Freq: Four times a day (QID) | RESPIRATORY_TRACT | Status: DC | PRN
  Administered 2017-08-06 (×2): 3 mL via RESPIRATORY_TRACT
  Filled 2017-08-05 (×2): qty 3

## 2017-08-05 MED ORDER — GUAIFENESIN-DM 100-10 MG/5ML PO SYRP
5.0000 mL | ORAL_SOLUTION | ORAL | Status: DC | PRN
  Administered 2017-08-05: 5 mL via ORAL
  Filled 2017-08-05: qty 5

## 2017-08-05 NOTE — Progress Notes (Addendum)
[place] [facility] [place] NAME: Angela Pham    MR#:  [id number]  DATE OF BIRTH:  23-Dec-1940  SUBJECTIVE:  CHIEF COMPLAINT:   Chief Complaint  Patient presents with  . Respiratory Distress   Still wheezing, shortness of breath and cough, on oxygen [place] 2 L REVIEW OF SYSTEMS:  Review of Systems  Constitutional: Positive for malaise/fatigue. Negative for chills and fever.  HENT: Negative for sore throat.   Eyes: Negative for blurred vision and double vision.  Respiratory: Positive for cough and shortness of breath. Negative for hemoptysis, wheezing and stridor.   Cardiovascular: Negative for chest pain, palpitations, orthopnea and leg swelling.  Gastrointestinal: Negative for abdominal pain, blood in stool, diarrhea, melena, nausea and vomiting.  Genitourinary: Negative for dysuria, flank pain and hematuria.  Musculoskeletal: Negative for back pain and joint pain.  Skin: Negative for rash.  Neurological: Negative for dizziness, sensory change, focal weakness, seizures, loss of consciousness, weakness and headaches.  Endo/Heme/Allergies: Negative for polydipsia.  Psychiatric/Behavioral: Negative for depression. The patient is not nervous/anxious.     DRUG ALLERGIES:   Allergies  Allergen Reactions  . Ace Inhibitors Anaphylaxis    Tongue swelling  . Shellfish Allergy Hives and Swelling    Swelling around face and mouth    VITALS:  Blood pressure 138/66, pulse (!) 109, temperature 97.9 F (36.6 C), temperature source Oral, resp. rate 16, height 4\' 11"  (1.499 m), weight 177 lb 3.2 oz (80.4 kg), SpO2 97 %. PHYSICAL EXAMINATION:  Physical Exam  Constitutional: She is oriented to person, place, and time and well-developed, well-nourished, and in no distress.  HENT:  Head: Normocephalic.  Mouth/Throat: Oropharynx is clear and moist.  Eyes: Conjunctivae and EOM are normal. Pupils are equal, round, and reactive to light. No scleral icterus.    Neck: Normal range of motion. Neck supple. No JVD present. No tracheal deviation present.  Cardiovascular: Normal rate, regular rhythm and normal heart sounds. Exam reveals no gallop.  No murmur heard. Pulmonary/Chest: Effort normal. No respiratory distress. She has no wheezes. She has no rales.  Abdominal: Soft. Bowel sounds are normal. She exhibits no distension. There is no tenderness. There is no rebound.  Musculoskeletal: Normal range of motion. She exhibits no edema or tenderness.  Neurological: She is alert and oriented to person, place, and time. No cranial nerve deficit.  Skin: No rash noted. No erythema.  Psychiatric: Affect normal.   LABORATORY PANEL:  Female CBC Recent Labs  Lab 08/04/17 0503  WBC 9.4  HGB 8.7*  HCT 27.2*  PLT 299   ------------------------------------------------------------------------------------------------------------------ Chemistries  Recent Labs  Lab 08/03/17 0702 08/04/17 0503  NA 140 142  K 4.6 3.8  CL 108 107  CO2 23 26  GLUCOSE 216* 150*  BUN 54* 52*  CREATININE 1.74* 1.54*  CALCIUM 8.4* 8.0*  AST 52*  --   ALT 30  --   ALKPHOS 60  --   BILITOT 0.6  --    RADIOLOGY:  No results found. ASSESSMENT AND PLAN:    76 year old female with past medical history of diabetes, hypertension, hyperlipidemia, history of chronic combined systolic/diastolic CHF, COPD who presents to the hospital due to shortness of breath.  1. Acute on chronic respiratory failure with hypoxia-secondary to COPD exacerbation and also CHF. Continue Lasix, discontinue IV steroids, p.o. prednisone taper, continue DuoNeb's, Pulmicort nebs. P Continue oxygen by nasal cannula.  2. COPD exacerbation As above. -Patient is on oxygen 2 L at home.  3. CHF-acute on chronic combined diastolic/systolic CHF. Continue Lasix, follow I's and O's and daily weights. -Continue carvedilol, hydralazine, Aldactone.  4. Chronic kidney disease stage III-creatinine is close  to baseline.  Stable.  5. Diabetes type 2 without complication-hold metformin, Place on sliding scale insulin.  6. Anxiety- on Xanax as needed.  7. Hyperlipidemia-continue atorvastatin.  8. Elevated troponin-secondary to supply demand ischemia from hypoxemia with CHF and COPD.  Continue telemetry monitor.  All the records are reviewed and case discussed with Care Management/Social Worker. Management plans discussed with the patient, family and they are in agreement.  CODE STATUS: Full Code  TOTAL TIME TAKING CARE OF THIS PATIENT: 32 minutes.   More than 50% of the time was spent in counseling/coordination of care: YES  POSSIBLE D/C IN 1-2 DAYS, DEPENDING ON CLINICAL CONDITION.   Demetrios Loll M.D on 08/05/2017 at 3:00 PM  Between 7am to 6pm - Pager - [phone]  After 6pm go to [email or web address] - [facility]

## 2017-08-05 NOTE — Care Management Important Message (Signed)
Important Message  Patient Details  Name: Angela Pham MRN: [id number] Date of Birth: January 20, 1941   Medicare Important Message Given:  Yes  Signed IM notice given   Katrina Stack, RN 08/05/2017, 11:18 AM

## 2017-08-05 NOTE — Plan of Care (Signed)
  Clinical Measurements: Ability to maintain clinical measurements within normal limits will improve 08/05/2017 1243 - Not Progressing by Daron Offer, RN Note Today's creatinine level elevated at 1.5. Will continue to monitor for improvement. Wenda Low [facility]

## 2017-08-05 NOTE — Care Management (Signed)
[facility] has confirmed that patient is currently open and followed by SN OT and Aide.

## 2017-08-05 NOTE — Care Management (Signed)
Pharmacist spoke with patient and has concerns about whether patient using her Inhaler/ nebulizer s correctly.  Patient is adamant that she is taking all of these medications correctly.  She is to be fitted with cpap through [facility] on 11.20.2018.  She says she is currently being seen by [facility] and PT. Have reached out to agency

## 2017-08-05 NOTE — Progress Notes (Signed)
Talked to patient about bed/chair alarm, patient refused alarm. Educated about patient safety and patient verbalized understanding. Patient is moderate fall risk, call light at reach and have non skid socks on. No other concern at the moment. RN will continue to monitor.

## 2017-08-06 LAB — GLUCOSE, CAPILLARY
GLUCOSE-CAPILLARY: 133 mg/dL — AB (ref 65–99)
GLUCOSE-CAPILLARY: 174 mg/dL — AB (ref 65–99)

## 2017-08-06 MED ORDER — BUDESONIDE 0.25 MG/2ML IN SUSP: 0.2500 mg | Freq: Two times a day (BID) | RESPIRATORY_TRACT | 0 refills | Status: DC

## 2017-08-06 MED ORDER — GUAIFENESIN-DM 100-10 MG/5ML PO SYRP: 5.0000 mL | ORAL_SOLUTION | ORAL | 0 refills | Status: DC | PRN

## 2017-08-06 MED ORDER — ARFORMOTEROL TARTRATE 15 MCG/2ML IN NEBU: 15.0000 ug | INHALATION_SOLUTION | Freq: Two times a day (BID) | RESPIRATORY_TRACT | 0 refills | Status: DC

## 2017-08-06 MED ORDER — PREDNISONE 10 MG PO TABS: ORAL_TABLET | ORAL | 0 refills | Status: DC

## 2017-08-06 NOTE — Plan of Care (Signed)
  Adequate for Discharge Education: Knowledge of General Education information will improve 08/06/2017 1356 - Adequate for Discharge by Feliberto Gottron, [place] Behavior/Discharge Planning: Ability to manage health-related needs will improve 08/06/2017 1356 - Adequate for Discharge by Feliberto Gottron, RN Clinical Measurements: Ability to maintain clinical measurements within normal limits will improve 08/06/2017 1356 - Adequate for Discharge by Chriss Czar, Illene Bolus, RN Will remain free from infection 08/06/2017 1356 - Adequate for Discharge by Feliberto Gottron, RN Diagnostic test results will improve 08/06/2017 1356 - Adequate for Discharge by Feliberto Gottron, RN Respiratory complications will improve 08/06/2017 1356 - Adequate for Discharge by Feliberto Gottron, RN Cardiovascular complication will be avoided 08/06/2017 1356 - Adequate for Discharge by Feliberto Gottron, RN Activity: Risk for activity intolerance will decrease 08/06/2017 1356 - Adequate for Discharge by Feliberto Gottron, RN Nutrition: Adequate nutrition will be maintained 08/06/2017 1356 - Adequate for Discharge by Feliberto Gottron, RN Coping: Level of anxiety will decrease 08/06/2017 1356 - Adequate for Discharge by Feliberto Gottron, RN Elimination: Will not experience complications related to bowel motility 08/06/2017 1356 - Adequate for Discharge by Chriss Czar, Illene Bolus, RN Will not experience complications related to urinary retention 08/06/2017 1356 - Adequate for Discharge by Feliberto Gottron, RN Pain Managment: General experience of comfort will improve 08/06/2017 1356 - Adequate for Discharge by Feliberto Gottron, RN Safety: Ability to remain free from injury will improve 08/06/2017 1356 - Adequate for Discharge by Chriss Czar, Illene Bolus, RN Skin Integrity: Risk for impaired skin integrity will  decrease 08/06/2017 1356 - Adequate for Discharge by Feliberto Gottron, RN Activity: Ability to implement measures to reduce episodes of fatigue will improve 08/06/2017 1356 - Adequate for Discharge by Feliberto Gottron, RN Ability to tolerate increased activity will improve 08/06/2017 1356 - Adequate for Discharge by Feliberto Gottron, RN Coping: Level of anxiety will decrease 08/06/2017 1356 - Adequate for Discharge by Feliberto Gottron, RN Respiratory: Levels of oxygenation will improve 08/06/2017 1356 - Adequate for Discharge by Feliberto Gottron, RN

## 2017-08-06 NOTE — Discharge Instructions (Signed)
Heart healthy and ADA diet. °

## 2017-08-06 NOTE — Progress Notes (Signed)
Pt given discharge instructions. Pt verbalized understanding. IV taken out, tele off. Transported via wheelchair.

## 2017-08-06 NOTE — Care Management Note (Signed)
Case Management Note  Patient Details  Name: Angela Pham MRN: [id number] Date of Birth: 07-Jan-1941  Subjective/Objective:  Resume HH=RN, OT, Aide with [facility].                  Action/Plan:   Expected Discharge Date:  08/06/17               Expected Discharge Plan:  [place]  In-House Referral:     Discharge planning Services  CM Consult  Post Acute Care Choice:  Home Health Choice offered to:  Patient  DME Arranged:    DME Agency:     HH Arranged:  RN, OT, Nurse's Aide(Resume) [place] Agency:  [facility] (formerly [employer])  Status of Service:  Completed, signed off  If discussed at [employer], dates discussed:    Additional Comments:  Janeene Sand A, RN 08/06/2017, 9:10 AM

## 2017-08-06 NOTE — Care Management Note (Signed)
Case Management Note  Patient Details  Name: Reneka Nebergall MRN: [id number] Date of Birth: [birth year]  Subjective/Objective:   Referral to [facility] to resume HH=RN, OT, Aide. Chronic 02 at home. .                 Action/Plan:   Expected Discharge Date:  08/06/17               Expected Discharge Plan:  [place]  In-House Referral:     Discharge planning Services  CM Consult  Post Acute Care Choice:  Home Health Choice offered to:  Patient  DME Arranged:    DME Agency:     HH Arranged:  RN, OT, Nurse's Aide(Resume) [place] Agency:  [facility] ([facility] [employer]  Status of Service:  Completed, signed off  If discussed at [employer], dates discussed:    Additional Comments:  Mailani Degroote A, RN 08/06/2017, 9:16 AM

## 2017-08-06 NOTE — Plan of Care (Signed)
  Clinical Measurements: Respiratory complications will improve 08/06/2017 0138 - Progressing by Jeri Cos, RN   Respiratory: Levels of oxygenation will improve 08/06/2017 0138 - Progressing by Jeri Cos, RN

## 2017-08-06 NOTE — Discharge Summary (Addendum)
[facility] NAME: Angela Pham    MR#:  [id number]  DATE OF BIRTH:  04-Mar-1941  DATE OF ADMISSION:  08/03/2017   ADMITTING PHYSICIAN: Henreitta Leber, MD  DATE OF DISCHARGE: No discharge date for patient encounter.  PRIMARY CARE PHYSICIAN: Cletis Athens, MD   ADMISSION DIAGNOSIS:  Acute pulmonary edema ([place]) [J81.0] Hypoxia [R09.02] Congestive heart failure, unspecified HF chronicity, unspecified heart failure type ([place]) [I50.9] DISCHARGE DIAGNOSIS:  Active Problems:   Acute respiratory failure with hypoxia ([place])  SECONDARY DIAGNOSIS:   Past Medical History:  Diagnosis Date  . Asthma   . Chronic combined systolic and diastolic CHF (congestive heart failure) ([place])    a. TTE 02/2017: EF 45-50%, basal amd midanteroseptal HK b. 05/2017: echo showing EF of 35-40%, mild AI, moderate MR, and mildly dilated LA.   Marland Kitchen Coronary artery disease, non-occlusive    a. LHC 02/2017 normal coronary arteries  . Hyperlipidemia   . Hypertension   . Morbid obesity ([place])   . Pulmonary hypertension ([place])   . Type II diabetes mellitus [facility])    HOSPITAL COURSE:   76 year old female with past medical history of diabetes, hypertension, hyperlipidemia, history of chronic combined systolic/diastolic CHF, COPD who presents to the hospital due to shortness of breath.  1. Acute on chronic respiratory failure with hypoxia-secondary to COPD exacerbation and also CHF. Continue Lasix, discontinued IV steroids, p.o. prednisone taper, continue DuoNeb's, Pulmicort nebs. Continue oxygen by nasal cannula.  2. COPD exacerbation As above. -Patient is on oxygen 2 L at home.  3. CHF-acute on chronic combined diastolic/systolic CHF. Continue Lasix, follow I's and O's and daily weights. -Continue carvedilol, hydralazine,Aldactone.  4. Chronic kidney disease stage III-creatinine is close to baseline.  Stable.  5. Diabetes type 2 without complication-hold  metformin, Place on sliding scale insulin. Resume metformin after discharge.  6. Anxiety- on Xanax as needed.  7. Hyperlipidemia-continue atorvastatin.  8. Elevated troponin-secondary to supply demand ischemia from hypoxemia with CHF and COPD.    DISCHARGE CONDITIONS:  Stable, Discharge home with home health today. CONSULTS OBTAINED:  Treatment Team:  Erby Pian, MD DRUG ALLERGIES:   Allergies  Allergen Reactions  . Ace Inhibitors Anaphylaxis    Tongue swelling  . Shellfish Allergy Hives and Swelling    Swelling around face and mouth    DISCHARGE MEDICATIONS:   Allergies as of 08/06/2017      Reactions   Ace Inhibitors Anaphylaxis   Tongue swelling   Shellfish Allergy Hives, Swelling   Swelling around face and mouth      Medication List    STOP taking these medications   predniSONE 10 MG (21) Tbpk tablet Commonly known as:  STERAPRED UNI-PAK 21 TAB Replaced by:  predniSONE 10 MG tablet     TAKE these medications   ALPRAZolam 0.25 MG tablet Commonly known as:  XANAX Take 1 tablet 3 (three) times daily by mouth.   arformoterol 15 MCG/2ML Nebu Commonly known as:  BROVANA Take 2 mLs (15 mcg total) 2 (two) times daily by nebulization.   aspirin 81 MG EC tablet Take 1 tablet (81 mg total) by mouth daily.   atorvastatin 40 MG tablet Commonly known as:  LIPITOR Take 40 mg by mouth daily.   budesonide 0.25 MG/2ML nebulizer solution Commonly known as:  PULMICORT Take 2 mLs (0.25 mg total) 2 (two) times daily by nebulization.   carvedilol 6.25 MG tablet Commonly known as:  COREG Take 1  tablet (6.25 mg total) by mouth 2 (two) times daily.   COMBIVENT IN Inhale 3 puffs into the lungs every 4 (four) hours as needed.   DUONEB 0.5-2.5 (3) MG/3ML Soln Generic drug:  ipratropium-albuterol Inhale 3 mLs into the lungs every 4 (four) hours as needed.   EYE VITAMINS PO Take 1 tablet by mouth 2 (two) times daily.   guaiFENesin-dextromethorphan 100-10  MG/5ML syrup Commonly known as:  ROBITUSSIN DM Take 5 mLs every 4 (four) hours as needed by mouth for cough.   hydrALAZINE 25 MG tablet Commonly known as:  APRESOLINE Take 1 tablet (25 mg total) by mouth every 8 (eight) hours.   levocetirizine 5 MG tablet Commonly known as:  XYZAL Take 10 mg by mouth daily.   metFORMIN 850 MG tablet Commonly known as:  GLUCOPHAGE Take 1 tablet by mouth 2 (two) times daily.   mirtazapine 45 MG tablet Commonly known as:  REMERON Take 1 tablet by mouth at bedtime.   montelukast 10 MG tablet Commonly known as:  SINGULAIR Take 1 tablet (10 mg total) by mouth at bedtime.   predniSONE 10 MG tablet Commonly known as:  DELTASONE 30 mg po daily for 1 day, 20 mg po daily for 1 day, 10 mg po daily for 1 day. Start taking on:  08/07/2017 Replaces:  predniSONE 10 MG (21) Tbpk tablet   senna-docusate 8.6-50 MG tablet Commonly known as:  Senokot-S Take 2 tablets by mouth 2 (two) times daily.   spironolactone 25 MG tablet Commonly known as:  ALDACTONE Take 0.5 tablets (12.5 mg total) by mouth daily.   tiotropium 18 MCG inhalation capsule Commonly known as:  SPIRIVA HANDIHALER Place 1 capsule (18 mcg total) into inhaler and inhale daily.   torsemide 20 MG tablet Commonly known as:  DEMADEX Take one tablet daily. May take one extra tablet daily for weight gain >3lbs overnight or >5lbs in a week. What changed:    how much to take  additional instructions        DISCHARGE INSTRUCTIONS:  See AVS.  If you experience worsening of your admission symptoms, develop shortness of breath, life threatening emergency, suicidal or homicidal thoughts you must seek medical attention immediately by calling 911 or calling your MD immediately  if symptoms less severe.  You Must read complete instructions/literature along with all the possible adverse reactions/side effects for all the Medicines you take and that have been prescribed to you. Take any new  Medicines after you have completely understood and accpet all the possible adverse reactions/side effects.   Please note  You were cared for by a hospitalist during your hospital stay. If you have any questions about your discharge medications or the care you received while you were in the hospital after you are discharged, you can call the unit and asked to speak with the hospitalist on call if the hospitalist that took care of you is not available. Once you are discharged, your primary care physician will handle any further medical issues. Please note that NO REFILLS for any discharge medications will be authorized once you are discharged, as it is imperative that you return to your primary care physician (or establish a relationship with a primary care physician if you do not have one) for your aftercare needs so that they can reassess your need for medications and monitor your lab values.    On the day of Discharge:  VITAL SIGNS:  Blood pressure 139/69, pulse (!) 109, temperature 98.4 F (36.9 C), temperature  source Oral, resp. rate 18, height 4\' 11"  (1.499 m), weight 179 lb 12.8 oz (81.6 kg), SpO2 97 %. PHYSICAL EXAMINATION:  GENERAL:  76 y.o.-year-old patient lying in the bed with no acute distress.  EYES: Pupils equal, round, reactive to light and accommodation. No scleral icterus. Extraocular muscles intact.  HEENT: Head atraumatic, normocephalic. Oropharynx and nasopharynx clear.  NECK:  Supple, no jugular venous distention. No thyroid enlargement, no tenderness.  LUNGS: Normal breath sounds bilaterally, no wheezing, rales,rhonchi or crepitation. No use of accessory muscles of respiration.  CARDIOVASCULAR: S1, S2 normal. No murmurs, rubs, or gallops.  ABDOMEN: Soft, non-tender, non-distended. Bowel sounds present. No organomegaly or mass.  EXTREMITIES: No pedal edema, cyanosis, or clubbing.  NEUROLOGIC: Cranial nerves II through XII are intact. Muscle strength 5/5 in all extremities.  Sensation intact. Gait not checked.  PSYCHIATRIC: The patient is alert and oriented x 3.  SKIN: No obvious rash, lesion, or ulcer.  DATA REVIEW:   CBC Recent Labs  Lab 08/04/17 0503  WBC 9.4  HGB 8.7*  HCT 27.2*  PLT 299    Chemistries  Recent Labs  Lab 08/03/17 0702 08/04/17 0503  NA 140 142  K 4.6 3.8  CL 108 107  CO2 23 26  GLUCOSE 216* 150*  BUN 54* 52*  CREATININE 1.74* 1.54*  CALCIUM 8.4* 8.0*  AST 52*  --   ALT 30  --   ALKPHOS 60  --   BILITOT 0.6  --      Microbiology Results  Results for orders placed or performed during the hospital encounter of 06/27/17  Culture, blood (Routine X 2) w Reflex to ID Panel     Status: None   Collection Time: 06/27/17  6:19 AM  Result Value Ref Range Status   Specimen Description BLOOD LEFT HAND  Final   Special Requests   Final    BOTTLES DRAWN AEROBIC AND ANAEROBIC Blood Culture adequate volume   Culture NO GROWTH 5 DAYS  Final   Report Status 07/02/2017 FINAL  Final  Culture, blood (Routine X 2) w Reflex to ID Panel     Status: None   Collection Time: 06/27/17  8:47 AM  Result Value Ref Range Status   Specimen Description BLOOD LEFT HAND  Final   Special Requests   Final    BOTTLES DRAWN AEROBIC AND ANAEROBIC Blood Culture adequate volume   Culture NO GROWTH 5 DAYS  Final   Report Status 07/02/2017 FINAL  Final  Urine Culture     Status: None   Collection Time: 07/03/17  4:40 PM  Result Value Ref Range Status   Specimen Description URINE, CLEAN CATCH  Final   Special Requests NONE  Final   Culture   Final    NO GROWTH Performed at [facility], [place] [address], [place], [place] [address]    Report Status 07/05/2017 FINAL  Final    RADIOLOGY:  No results found.   Management plans discussed with the patient, family and they are in agreement.  CODE STATUS: Full Code   TOTAL TIME TAKING CARE OF THIS PATIENT: 35 minutes.    Demetrios Loll M.D on 08/06/2017 at 1:03 PM  Between 7am to 6pm - Pager -  [phone]  After 6pm go to [email or web address] - Proofreader  [facility]  Office  [phone]  CC: Primary care physician; Cletis Athens, MD   Note: This dictation was prepared with Dragon dictation along with smaller phrase technology. Any transcriptional  errors that result from this process are unintentional.

## 2017-08-08 ENCOUNTER — Telehealth: Payer: Self-pay | Admitting: Pulmonary Disease

## 2017-08-08 MED ORDER — BUDESONIDE 0.25 MG/2ML IN SUSP: 0.2500 mg | Freq: Two times a day (BID) | RESPIRATORY_TRACT | 0 refills | Status: DC

## 2017-08-08 MED ORDER — ARFORMOTEROL TARTRATE 15 MCG/2ML IN NEBU: 15.0000 ug | INHALATION_SOLUTION | Freq: Two times a day (BID) | RESPIRATORY_TRACT | 0 refills | Status: DC

## 2017-08-08 NOTE — Telephone Encounter (Signed)
Called [facility] and they state theRXs do not need a PA. They state the insurance the pt has is an HMO and one cost $104 and the other is $44. Called pt and informed her what the pharmacy stated and that she can call her insurance and ask them what is covered in place of the brovana since it cost the most. Pt states she will call her insurance. Nothing further needed.

## 2017-08-08 NOTE — Telephone Encounter (Signed)
Pt states she has Medicaid and states [facility] called [facility] to see why both ins were not run. I informed patient we could not initiate a PA until we get denial from [facility]. Patient is going to call [facility] and request denial be faxed. She paid out of pocket $104 Brovanna and $44 Pulmicort.

## 2017-08-08 NOTE — Telephone Encounter (Signed)
Patient ran out of nebulizer prior to recent hospital admission   Needs prior auth for insurance to cover   Brovana pulmacort  Please call to  Discuss as patient will be out in 2 weeks   Patient also says she needs a 2 week fu   Scheduled with Dr. Mortimer Fries 11/26 at 245 pm

## 2017-08-08 NOTE — Telephone Encounter (Signed)
Pt called back states she needs a PA, not a rx. Please call.

## 2017-08-08 NOTE — Telephone Encounter (Signed)
Pulmicort and Brovana sent to [facility] with DX code. Nothing further needed.

## 2017-08-08 NOTE — Telephone Encounter (Signed)
Please call pt regarding PA.

## 2017-08-10 ENCOUNTER — Ambulatory Visit: Payer: 59 | Admitting: Family

## 2017-08-17 ENCOUNTER — Telehealth: Payer: Self-pay | Admitting: Pulmonary Disease

## 2017-08-17 DIAGNOSIS — G4733 Obstructive sleep apnea (adult) (pediatric): Secondary | ICD-10-CM

## 2017-08-17 NOTE — Addendum Note (Signed)
Addended by: Oscar La R on: 08/17/2017 11:45 AM   Modules accepted: Orders

## 2017-08-17 NOTE — Telephone Encounter (Signed)
Will place a new order for O2 to be bleed into her CPAP. Informed pt she needs to contact [facility] and find out when they will be coming back out since we didn't initiate the [facility] order. Informed pt that we have not any denials for her neb medications. The meds are not due to be filled yet. Informed pt that she will need to contact [facility] in regards to getting her money back from her last refill since she claims [facility] wasn't aware that she had medicaid. Pt verbalized understanding. Nothing further needed.

## 2017-08-17 NOTE — Telephone Encounter (Signed)
Patient just received her cpap and started using it.  She says she was previously on 2 L o2 [place] at night and this was not hooked up in cpap set up   Patient woke up this morning nad felt sob she she had to apply o2 for a while   Patient also wants to find out why [facility] home [facility] has not made any visits since Hospital visits    Patient is also still waiting on PA to be done for brovana and pulmicort as previously discussed   Please call

## 2017-08-20 ENCOUNTER — Emergency Department: Payer: 59

## 2017-08-20 ENCOUNTER — Other Ambulatory Visit: Payer: Self-pay

## 2017-08-20 ENCOUNTER — Inpatient Hospital Stay
Admission: EM | Admit: 2017-08-20 | Discharge: 2017-08-23 | DRG: 291 | Disposition: A | Discharge status: Home Health | Payer: 59 | Attending: Internal Medicine | Admitting: Internal Medicine

## 2017-08-20 DIAGNOSIS — I5042 Chronic combined systolic (congestive) and diastolic (congestive) heart failure: Secondary | ICD-10-CM | POA: Diagnosis not present

## 2017-08-20 DIAGNOSIS — D509 Iron deficiency anemia, unspecified: Secondary | ICD-10-CM | POA: Diagnosis present

## 2017-08-20 DIAGNOSIS — F419 Anxiety disorder, unspecified: Secondary | ICD-10-CM | POA: Diagnosis present

## 2017-08-20 DIAGNOSIS — E785 Hyperlipidemia, unspecified: Secondary | ICD-10-CM | POA: Diagnosis present

## 2017-08-20 DIAGNOSIS — I429 Cardiomyopathy, unspecified: Secondary | ICD-10-CM | POA: Diagnosis present

## 2017-08-20 DIAGNOSIS — I130 Hypertensive heart and chronic kidney disease with heart failure and stage 1 through stage 4 chronic kidney disease, or unspecified chronic kidney disease: Principal | ICD-10-CM | POA: Diagnosis present

## 2017-08-20 DIAGNOSIS — Z888 Allergy status to other drugs, medicaments and biological substances status: Secondary | ICD-10-CM | POA: Diagnosis not present

## 2017-08-20 DIAGNOSIS — Z9981 Dependence on supplemental oxygen: Secondary | ICD-10-CM | POA: Diagnosis not present

## 2017-08-20 DIAGNOSIS — Z87891 Personal history of nicotine dependence: Secondary | ICD-10-CM | POA: Diagnosis not present

## 2017-08-20 DIAGNOSIS — J9601 Acute respiratory failure with hypoxia: Secondary | ICD-10-CM | POA: Diagnosis not present

## 2017-08-20 DIAGNOSIS — I5043 Acute on chronic combined systolic (congestive) and diastolic (congestive) heart failure: Secondary | ICD-10-CM | POA: Diagnosis present

## 2017-08-20 DIAGNOSIS — I447 Left bundle-branch block, unspecified: Secondary | ICD-10-CM | POA: Diagnosis present

## 2017-08-20 DIAGNOSIS — Z66 Do not resuscitate: Secondary | ICD-10-CM | POA: Diagnosis present

## 2017-08-20 DIAGNOSIS — J441 Chronic obstructive pulmonary disease with (acute) exacerbation: Secondary | ICD-10-CM | POA: Diagnosis present

## 2017-08-20 DIAGNOSIS — G4733 Obstructive sleep apnea (adult) (pediatric): Secondary | ICD-10-CM | POA: Diagnosis present

## 2017-08-20 DIAGNOSIS — N183 Chronic kidney disease, stage 3 (moderate): Secondary | ICD-10-CM | POA: Diagnosis present

## 2017-08-20 DIAGNOSIS — Z8249 Family history of ischemic heart disease and other diseases of the circulatory system: Secondary | ICD-10-CM | POA: Diagnosis not present

## 2017-08-20 DIAGNOSIS — I2510 Atherosclerotic heart disease of native coronary artery without angina pectoris: Secondary | ICD-10-CM | POA: Diagnosis present

## 2017-08-20 DIAGNOSIS — I272 Pulmonary hypertension, unspecified: Secondary | ICD-10-CM | POA: Diagnosis present

## 2017-08-20 DIAGNOSIS — I5032 Chronic diastolic (congestive) heart failure: Secondary | ICD-10-CM

## 2017-08-20 DIAGNOSIS — J9621 Acute and chronic respiratory failure with hypoxia: Secondary | ICD-10-CM | POA: Diagnosis present

## 2017-08-20 DIAGNOSIS — E1122 Type 2 diabetes mellitus with diabetic chronic kidney disease: Secondary | ICD-10-CM | POA: Diagnosis present

## 2017-08-20 DIAGNOSIS — N179 Acute kidney failure, unspecified: Secondary | ICD-10-CM | POA: Diagnosis present

## 2017-08-20 DIAGNOSIS — E119 Type 2 diabetes mellitus without complications: Secondary | ICD-10-CM

## 2017-08-20 DIAGNOSIS — Z91013 Allergy to seafood: Secondary | ICD-10-CM

## 2017-08-20 DIAGNOSIS — Z79899 Other long term (current) drug therapy: Secondary | ICD-10-CM

## 2017-08-20 DIAGNOSIS — J069 Acute upper respiratory infection, unspecified: Secondary | ICD-10-CM

## 2017-08-20 DIAGNOSIS — R0602 Shortness of breath: Secondary | ICD-10-CM | POA: Diagnosis present

## 2017-08-20 DIAGNOSIS — J810 Acute pulmonary edema: Secondary | ICD-10-CM

## 2017-08-20 DIAGNOSIS — Z7984 Long term (current) use of oral hypoglycemic drugs: Secondary | ICD-10-CM

## 2017-08-20 DIAGNOSIS — I080 Rheumatic disorders of both mitral and aortic valves: Secondary | ICD-10-CM | POA: Diagnosis present

## 2017-08-20 DIAGNOSIS — Z7951 Long term (current) use of inhaled steroids: Secondary | ICD-10-CM

## 2017-08-20 DIAGNOSIS — R000 Tachycardia, unspecified: Secondary | ICD-10-CM | POA: Diagnosis present

## 2017-08-20 DIAGNOSIS — I428 Other cardiomyopathies: Secondary | ICD-10-CM | POA: Diagnosis not present

## 2017-08-20 DIAGNOSIS — Z6835 Body mass index (BMI) 35.0-35.9, adult: Secondary | ICD-10-CM | POA: Diagnosis not present

## 2017-08-20 DIAGNOSIS — E669 Obesity, unspecified: Secondary | ICD-10-CM | POA: Diagnosis present

## 2017-08-20 DIAGNOSIS — Z7982 Long term (current) use of aspirin: Secondary | ICD-10-CM

## 2017-08-20 DIAGNOSIS — I509 Heart failure, unspecified: Secondary | ICD-10-CM

## 2017-08-20 LAB — GLUCOSE, CAPILLARY
GLUCOSE-CAPILLARY: 246 mg/dL — AB (ref 65–99)
GLUCOSE-CAPILLARY: 130 mg/dL — AB (ref 65–99)
Glucose-Capillary: 152 mg/dL — ABNORMAL HIGH (ref 65–99)

## 2017-08-20 LAB — COMPREHENSIVE METABOLIC PANEL
ALBUMIN: 3.4 g/dL — AB (ref 3.5–5.0)
ALT: 27 U/L (ref 14–54)
ANION GAP: 11 (ref 5–15)
AST: 36 U/L (ref 15–41)
Alkaline Phosphatase: 47 U/L (ref 38–126)
BUN: 42 mg/dL — ABNORMAL HIGH (ref 6–20)
CO2: 23 mmol/L (ref 22–32)
Calcium: 8.4 mg/dL — ABNORMAL LOW (ref 8.9–10.3)
Chloride: 108 mmol/L (ref 101–111)
Creatinine, Ser: 1.94 mg/dL — ABNORMAL HIGH (ref 0.44–1.00)
GFR calc Af Amer: 28 mL/min — ABNORMAL LOW (ref >60)
GFR calc non Af Amer: 24 mL/min — ABNORMAL LOW (ref >60)
GLUCOSE: 193 mg/dL — AB (ref 65–99)
POTASSIUM: 4.4 mmol/L (ref 3.5–5.1)
SODIUM: 142 mmol/L (ref 135–145)
TOTAL PROTEIN: 6.4 g/dL — AB (ref 6.5–8.1)
Total Bilirubin: 0.7 mg/dL (ref 0.3–1.2)

## 2017-08-20 LAB — TROPONIN I
Troponin I: 0.05 ng/mL (ref <0.03)
Troponin I: 0.1 ng/mL (ref <0.03)
Troponin I: 0.08 ng/mL (ref <0.03)

## 2017-08-20 LAB — CBC
HCT: 28.6 % — ABNORMAL LOW (ref 35.0–47.0)
Hemoglobin: 9.1 g/dL — ABNORMAL LOW (ref 12.0–16.0)
MCH: 27.9 pg (ref 26.0–34.0)
MCHC: 32 g/dL (ref 32.0–36.0)
MCV: 87.2 fL (ref 80.0–100.0)
Platelets: 335 10*3/uL (ref 150–440)
RBC: 3.27 MIL/uL — ABNORMAL LOW (ref 3.80–5.20)
RDW: 18.9 % — AB (ref 11.5–14.5)
WBC: 19 10*3/uL — ABNORMAL HIGH (ref 3.6–11.0)

## 2017-08-20 LAB — BRAIN NATRIURETIC PEPTIDE: B Natriuretic Peptide: 215 pg/mL — ABNORMAL HIGH (ref 0.0–100.0)

## 2017-08-20 MED ORDER — FUROSEMIDE 10 MG/ML IJ SOLN
40.0000 mg | Freq: Two times a day (BID) | INTRAMUSCULAR | Status: DC
  Administered 2017-08-21 – 2017-08-22 (×3): 40 mg via INTRAVENOUS
  Filled 2017-08-20 (×3): qty 4

## 2017-08-20 MED ORDER — IPRATROPIUM-ALBUTEROL 0.5-2.5 (3) MG/3ML IN SOLN
3.0000 mL | Freq: Once | RESPIRATORY_TRACT | Status: AC
  Administered 2017-08-20: 3 mL via RESPIRATORY_TRACT

## 2017-08-20 MED ORDER — ONDANSETRON HCL 4 MG PO TABS: 4.0000 mg | ORAL_TABLET | Freq: Four times a day (QID) | ORAL | Status: DC | PRN

## 2017-08-20 MED ORDER — LEVOCETIRIZINE DIHYDROCHLORIDE 5 MG PO TABS: 10.0000 mg | ORAL_TABLET | Freq: Every day | ORAL | Status: DC

## 2017-08-20 MED ORDER — ATORVASTATIN CALCIUM 20 MG PO TABS
40.0000 mg | ORAL_TABLET | Freq: Every day | ORAL | Status: DC
  Administered 2017-08-20 – 2017-08-23 (×4): 40 mg via ORAL
  Filled 2017-08-20 (×4): qty 2

## 2017-08-20 MED ORDER — POTASSIUM CHLORIDE CRYS ER 20 MEQ PO TBCR
20.0000 meq | EXTENDED_RELEASE_TABLET | Freq: Two times a day (BID) | ORAL | Status: DC
  Administered 2017-08-20 – 2017-08-23 (×7): 20 meq via ORAL
  Filled 2017-08-20 (×7): qty 1

## 2017-08-20 MED ORDER — LORATADINE 10 MG PO TABS
10.0000 mg | ORAL_TABLET | Freq: Every day | ORAL | Status: DC
  Administered 2017-08-21 – 2017-08-23 (×3): 10 mg via ORAL
  Filled 2017-08-20 (×3): qty 1

## 2017-08-20 MED ORDER — IPRATROPIUM-ALBUTEROL 0.5-2.5 (3) MG/3ML IN SOLN
3.0000 mL | Freq: Four times a day (QID) | RESPIRATORY_TRACT | Status: DC
  Administered 2017-08-20 – 2017-08-21 (×4): 3 mL via RESPIRATORY_TRACT
  Filled 2017-08-20 (×4): qty 3

## 2017-08-20 MED ORDER — ACETAMINOPHEN 650 MG RE SUPP: 650.0000 mg | Freq: Four times a day (QID) | RECTAL | Status: DC | PRN

## 2017-08-20 MED ORDER — MONTELUKAST SODIUM 10 MG PO TABS
10.0000 mg | ORAL_TABLET | Freq: Every day | ORAL | Status: DC
  Administered 2017-08-20 – 2017-08-22 (×3): 10 mg via ORAL
  Filled 2017-08-20 (×3): qty 1

## 2017-08-20 MED ORDER — TIOTROPIUM BROMIDE MONOHYDRATE 18 MCG IN CAPS
18.0000 ug | ORAL_CAPSULE | Freq: Every day | RESPIRATORY_TRACT | Status: DC
  Administered 2017-08-20 – 2017-08-23 (×4): 18 ug via RESPIRATORY_TRACT
  Filled 2017-08-20: qty 5

## 2017-08-20 MED ORDER — IPRATROPIUM-ALBUTEROL 0.5-2.5 (3) MG/3ML IN SOLN
3.0000 mL | Freq: Once | RESPIRATORY_TRACT | Status: AC
  Administered 2017-08-20: 3 mL via RESPIRATORY_TRACT
  Filled 2017-08-20: qty 6

## 2017-08-20 MED ORDER — INSULIN ASPART 100 UNIT/ML ~~LOC~~ SOLN
0.0000 [IU] | Freq: Three times a day (TID) | SUBCUTANEOUS | Status: DC
  Administered 2017-08-20: 3 [IU] via SUBCUTANEOUS
  Administered 2017-08-20: 2 [IU] via SUBCUTANEOUS
  Administered 2017-08-21: 1 [IU] via SUBCUTANEOUS
  Administered 2017-08-21: 2 [IU] via SUBCUTANEOUS
  Administered 2017-08-21 – 2017-08-22 (×2): 1 [IU] via SUBCUTANEOUS
  Administered 2017-08-22: 2 [IU] via SUBCUTANEOUS
  Administered 2017-08-23: 1 [IU] via SUBCUTANEOUS
  Administered 2017-08-23: 2 [IU] via SUBCUTANEOUS
  Filled 2017-08-20 (×8): qty 1

## 2017-08-20 MED ORDER — PANTOPRAZOLE SODIUM 40 MG IV SOLR
40.0000 mg | Freq: Two times a day (BID) | INTRAVENOUS | Status: DC
  Administered 2017-08-20 – 2017-08-21 (×3): 40 mg via INTRAVENOUS
  Filled 2017-08-20 (×3): qty 40

## 2017-08-20 MED ORDER — BUDESONIDE 0.25 MG/2ML IN SUSP
0.2500 mg | Freq: Two times a day (BID) | RESPIRATORY_TRACT | Status: DC
  Administered 2017-08-20 – 2017-08-23 (×6): 0.25 mg via RESPIRATORY_TRACT
  Filled 2017-08-20 (×6): qty 2

## 2017-08-20 MED ORDER — ARFORMOTEROL TARTRATE 15 MCG/2ML IN NEBU
15.0000 ug | INHALATION_SOLUTION | Freq: Two times a day (BID) | RESPIRATORY_TRACT | Status: DC
  Administered 2017-08-20 – 2017-08-22 (×5): 15 ug via RESPIRATORY_TRACT
  Filled 2017-08-20 (×7): qty 2

## 2017-08-20 MED ORDER — METFORMIN HCL 850 MG PO TABS: 850.0000 mg | ORAL_TABLET | Freq: Three times a day (TID) | ORAL | Status: DC

## 2017-08-20 MED ORDER — ASPIRIN EC 81 MG PO TBEC
81.0000 mg | DELAYED_RELEASE_TABLET | Freq: Every day | ORAL | Status: DC
  Administered 2017-08-20 – 2017-08-23 (×4): 81 mg via ORAL
  Filled 2017-08-20 (×4): qty 1

## 2017-08-20 MED ORDER — ALPRAZOLAM 0.25 MG PO TABS
0.2500 mg | ORAL_TABLET | Freq: Three times a day (TID) | ORAL | Status: DC | PRN
  Administered 2017-08-20 – 2017-08-22 (×3): 0.25 mg via ORAL
  Filled 2017-08-20 (×3): qty 1

## 2017-08-20 MED ORDER — HEPARIN SODIUM (PORCINE) 5000 UNIT/ML IJ SOLN
5000.0000 [IU] | Freq: Three times a day (TID) | INTRAMUSCULAR | Status: DC
  Administered 2017-08-20 – 2017-08-23 (×9): 5000 [IU] via SUBCUTANEOUS
  Filled 2017-08-20 (×10): qty 1

## 2017-08-20 MED ORDER — CARVEDILOL 6.25 MG PO TABS
6.2500 mg | ORAL_TABLET | Freq: Two times a day (BID) | ORAL | Status: DC
  Administered 2017-08-20 – 2017-08-21 (×3): 6.25 mg via ORAL
  Filled 2017-08-20 (×4): qty 1

## 2017-08-20 MED ORDER — ENOXAPARIN SODIUM 30 MG/0.3ML ~~LOC~~ SOLN: 30.0000 mg | SUBCUTANEOUS | Status: DC

## 2017-08-20 MED ORDER — ACETAMINOPHEN 325 MG PO TABS: 650.0000 mg | ORAL_TABLET | Freq: Four times a day (QID) | ORAL | Status: DC | PRN

## 2017-08-20 MED ORDER — DOCUSATE SODIUM 100 MG PO CAPS
100.0000 mg | ORAL_CAPSULE | Freq: Two times a day (BID) | ORAL | Status: DC
  Administered 2017-08-20 – 2017-08-21 (×4): 100 mg via ORAL
  Filled 2017-08-20 (×5): qty 1

## 2017-08-20 MED ORDER — SPIRONOLACTONE 25 MG PO TABS
12.5000 mg | ORAL_TABLET | Freq: Every day | ORAL | Status: DC
  Administered 2017-08-20 – 2017-08-21 (×2): 12.5 mg via ORAL
  Filled 2017-08-20 (×2): qty 1

## 2017-08-20 MED ORDER — MIRTAZAPINE 15 MG PO TABS
45.0000 mg | ORAL_TABLET | Freq: Every day | ORAL | Status: DC
  Administered 2017-08-20 – 2017-08-22 (×3): 45 mg via ORAL
  Filled 2017-08-20 (×3): qty 3

## 2017-08-20 MED ORDER — HYDRALAZINE HCL 25 MG PO TABS
25.0000 mg | ORAL_TABLET | Freq: Three times a day (TID) | ORAL | Status: DC
Start: 2017-08-20 — End: 2017-08-23
  Administered 2017-08-20 – 2017-08-23 (×10): 25 mg via ORAL
  Filled 2017-08-20 (×10): qty 1

## 2017-08-20 MED ORDER — DEXTROSE 5 % IV SOLN
500.0000 mg | INTRAVENOUS | Status: DC
  Administered 2017-08-20 – 2017-08-21 (×2): 500 mg via INTRAVENOUS
  Filled 2017-08-20 (×2): qty 500

## 2017-08-20 MED ORDER — MORPHINE SULFATE (PF) 2 MG/ML IV SOLN: 2.0000 mg | INTRAVENOUS | Status: DC | PRN

## 2017-08-20 MED ORDER — FUROSEMIDE 10 MG/ML IJ SOLN
60.0000 mg | Freq: Once | INTRAMUSCULAR | Status: AC
  Administered 2017-08-20: 60 mg via INTRAVENOUS
  Filled 2017-08-20: qty 8

## 2017-08-20 MED ORDER — ONDANSETRON HCL 4 MG/2ML IJ SOLN: 4.0000 mg | Freq: Four times a day (QID) | INTRAMUSCULAR | Status: DC | PRN

## 2017-08-20 MED ORDER — BISACODYL 10 MG RE SUPP: 10.0000 mg | Freq: Every day | RECTAL | Status: DC | PRN

## 2017-08-20 NOTE — Plan of Care (Signed)
  Clinical Measurements: Ability to maintain clinical measurements within normal limits will improve 08/20/2017 1646 - Not Progressing by Daron Offer, RN Note G.F.R. = only 28 today. Will continue to monitor renal function. Wenda Low [facility]

## 2017-08-20 NOTE — Progress Notes (Signed)
Physical Therapy Evaluation Patient Details Name: Angela Pham MRN: [id number] DOB: [birth year] Today's Date: 08/20/2017   History of Present Illness  Patient is a 76 y.o. female admitted on 24 NOV with 3-4 days SOB. Per patient was having mechanical issues with CPAP machine. PMH includes chronic combined systolic/diastolic CHF, DMII, acute URI, COPD, CAD, and OSA.  Clinical Impression  Patient admitted for above listed reasons. Patient on evaluation agitated/irritated because of hunger. Patient demonstrates modI with bed mobility and STS transfer; however, her HR was 115 bpm on 3L O2. Denies chest pain and refuses gait assessment at this time. Per patient, she was receiving HHPT prior to admission which was reportedly caused by CPAP malfunction/mechanical issues. Because patient is limited by cardiopulmonary impairments, it is believed she will continue to benefit from skilled, progressive PT, including gait assessment to allow for safe d/c home and f/u PT when medically ready.    Follow Up Recommendations Home health PT    Equipment Recommendations  None recommended by PT    Recommendations for Other Services       Precautions / Restrictions Precautions Precautions: None Restrictions Weight Bearing Restrictions: No      Mobility  Bed Mobility Overal bed mobility: Modified Independent             General bed mobility comments: Patient performs bed mobility with HOB elevated and use of bed rails.  Transfers Overall transfer level: Modified independent Equipment used: None             General transfer comment: Patient moves from sit to stand and stand to sit on 3L O2 with oxygen saturations WNL and HR 115 bpm. Denies chest pain.  Ambulation/Gait             General Gait Details: Patient refused.  Stairs            Wheelchair Mobility    Modified Rankin (Stroke Patients Only)       Balance Overall balance assessment: Modified Independent                                           Pertinent Vitals/Pain Pain Assessment: No/denies pain    Home Living Family/patient expects to be discharged to:: Private residence Living Arrangements: Other relatives Available Help at Discharge: Family;Available PRN/intermittently Type of Home: House Home Access: Stairs to enter Entrance Stairs-Rails: None Entrance Stairs-Number of Steps: 6 Home Layout: One level Home Equipment: Tub bench Additional Comments: Patient was undergoing HHPT prior to recent admission.    Prior Function Level of Independence: Needs assistance   Gait / Transfers Assistance Needed: Performed independently   ADL's / Homemaking Assistance Needed: Performed by family intermittently        Hand Dominance        Extremity/Trunk Assessment   Upper Extremity Assessment Upper Extremity Assessment: Generalized weakness    Lower Extremity Assessment Lower Extremity Assessment: Generalized weakness       Communication   Communication: No difficulties  Cognition Arousal/Alertness: Awake/alert Behavior During Therapy: Agitated Overall Cognitive Status: Within Functional Limits for tasks assessed                                        General Comments      Exercises     Assessment/Plan  PT Assessment Patient needs continued PT services  PT Problem List Decreased strength;Decreased activity tolerance;Decreased mobility;Cardiopulmonary status limiting activity       PT Treatment Interventions Gait training;Stair training;Functional mobility training;Therapeutic activities;Therapeutic exercise;Balance training;Patient/family education    PT Goals (Current goals can be found in the Care Plan section)  Acute Rehab PT Goals Patient Stated Goal: To eat and be left alone. PT Goal Formulation: With patient Time For Goal Achievement: 09/03/17 Potential to Achieve Goals: Good    Frequency Min 2X/week   Barriers to  discharge        Co-evaluation               AM-PAC PT "6 Clicks" Daily Activity  Outcome Measure Difficulty turning over in bed (including adjusting bedclothes, sheets and blankets)?: A Little Difficulty moving from lying on back to sitting on the side of the bed? : A Little Difficulty sitting down on and standing up from a chair with arms (e.g., wheelchair, bedside commode, etc,.)?: A Little Help needed moving to and from a bed to chair (including a wheelchair)?: A Little Help needed walking in hospital room?: A Little Help needed climbing 3-5 steps with a railing? : A Little 6 Click Score: 18    End of Session Equipment Utilized During Treatment: Gait belt;Oxygen Activity Tolerance: Treatment limited secondary to agitation Patient left: in bed;with call bell/phone within reach   PT Visit Diagnosis: Muscle weakness (generalized) (M62.81);Difficulty in walking, not elsewhere classified (R26.2)    Time: 9767-3419 PT Time Calculation (min) (ACUTE ONLY): 13 min   Charges:   PT Evaluation $PT Eval Low Complexity: 1 Low     PT G Codes:   PT G-Codes **NOT FOR INPATIENT CLASS** Functional Assessment Tool Used: AM-PAC 6 Clicks Basic Mobility;Clinical judgement Functional Limitation: Mobility: Walking and moving around Mobility: Walking and Moving Around Current Status (F7902): At least 40 percent but less than 60 percent impaired, limited or restricted Mobility: Walking and Moving Around Goal Status [phone]): At least 20 percent but less than 40 percent impaired, limited or restricted      Dorice Lamas, PT, DPT, COMT 08/20/2017, 1:35 PM

## 2017-08-20 NOTE — Clinical Social Work Note (Signed)
CSW received consult for possible SNF placement. PT is pending. CSW will assess pending PT recommendation.  Santiago Bumpers, MSW, Latanya Presser [phone]

## 2017-08-20 NOTE — ED Triage Notes (Signed)
Pt came to ED via EMS from home, son called for respiratory distress. Per EMS, pt has been in and out of hospital past few months for breathing problems. History of copd, chf, diabetes. Pt arrived with CPAP. Given albuterol and 125mg  solumedrol en route. CBG 235 per EMS. HR 120s.

## 2017-08-20 NOTE — H&P (Signed)
History and Physical    Angela Pham QQP:[id number] DOB: 1941-07-22 DOA: 08/20/2017  Referring physician: Dr. Kerman Passey PCP: Angela Athens, MD  Specialists: Dr. Alva Garnet  Chief Complaint: SOB with cough  HPI: Angela Pham is a 76 y.o. female has a past medical history significant for COPD, CAD, CHF, and OSA now with 3-4 day hx of progressive SOB. Has not been using CPAP or O2 at home due to technical reasons. Presents to in in severe distress with SOB and CP requiring BiPAP. After treatment in ER, pt is now lees SOB with no CP. Weaned off BiPAP to 4L O2 per [place]. She is now admitted. No fever. Sputum is yellow. No N/V/D.   Review of Systems: The patient denies anorexia, fever, weight loss,, vision loss, decreased hearing, hoarseness, syncope,  peripheral edema, balance deficits, hemoptysis, abdominal pain, melena, hematochezia, severe indigestion/heartburn, hematuria, incontinence, genital sores, muscle weakness, suspicious skin lesions, transient blindness, difficulty walking, depression, unusual weight change, abnormal bleeding, enlarged lymph nodes, angioedema, and breast masses.   Past Medical History:  Diagnosis Date  . Asthma   . Chronic combined systolic and diastolic CHF (congestive heart failure) ([place])    a. TTE 02/2017: EF 45-50%, basal amd midanteroseptal HK b. 05/2017: echo showing EF of 35-40%, mild AI, moderate MR, and mildly dilated LA.   Marland Kitchen Coronary artery disease, non-occlusive    a. LHC 02/2017 normal coronary arteries  . Hyperlipidemia   . Hypertension   . Morbid obesity ([place])   . Pulmonary hypertension ([place])   . Type II diabetes mellitus ([place])    Past Surgical History:  Procedure Laterality Date  . c-section    . RIGHT/LEFT HEART CATH AND CORONARY ANGIOGRAPHY N/A 03/09/2017   Procedure: Right/Left Heart Cath and Coronary Angiography;  Surgeon: Minna Merritts, MD;  Location: [facility];  Service: Cardiovascular;  Laterality: N/A;   Social History:  reports  that she has quit smoking. Her smoking use included cigarettes. She has a 6.00 pack-year smoking history. she has never used smokeless tobacco. She reports that she does not drink alcohol or use drugs.  Allergies  Allergen Reactions  . Ace Inhibitors Anaphylaxis    Tongue swelling  . Shellfish Allergy Hives and Swelling    Swelling around face and mouth     Family History  Problem Relation Age of Onset  . CAD Father   . Colon cancer Sister   . Leukemia Brother   . Throat cancer Brother   . Cervical cancer Sister     Prior to Admission medications   Medication Sig Start Date End Date Taking? Authorizing Provider  ALPRAZolam Duanne Moron) 0.25 MG tablet Take 1 tablet by mouth 3 (three) times daily as needed for anxiety or sleep.  06/10/17  Yes [provider]  arformoterol (BROVANA) 15 MCG/2ML NEBU Take 2 mLs (15 mcg total) 2 (two) times daily by nebulization. DX: COPD J44.9 08/08/17  Yes Wilhelmina Mcardle, MD  aspirin EC 81 MG EC tablet Take 1 tablet (81 mg total) by mouth daily. 03/12/17  Yes Mody, Ulice Bold, MD  atorvastatin (LIPITOR) 40 MG tablet Take 40 mg by mouth daily.   Yes [provider]  budesonide (PULMICORT) 0.25 MG/2ML nebulizer solution Take 2 mLs (0.25 mg total) 2 (two) times daily by nebulization. DX: COPD J44.9 08/08/17  Yes Wilhelmina Mcardle, MD  carvedilol (COREG) 6.25 MG tablet Take 1 tablet (6.25 mg total) by mouth 2 (two) times daily. 06/23/17  Yes Strader, Fransisco Hertz, PA-C  hydrALAZINE (APRESOLINE) 25 MG tablet Take 1 tablet (25 mg total) by mouth every 8 (eight) hours. 05/08/17  Yes Vaughan Basta, MD  Ipratropium-Albuterol (COMBIVENT IN) Inhale 3 puffs into the lungs every 4 (four) hours as needed.   Yes [provider]  ipratropium-albuterol (DUONEB) 0.5-2.5 (3) MG/3ML SOLN Inhale 3 mLs into the lungs every 4 (four) hours as needed.   Yes [provider]  levocetirizine (XYZAL) 5 MG tablet Take 10 mg by mouth daily. 03/18/17  Yes  [provider]  metFORMIN (GLUCOPHAGE) 850 MG tablet Take 1 tablet by mouth 3 (three) times daily.    Yes [provider]  mirtazapine (REMERON) 45 MG tablet Take 1 tablet by mouth at bedtime.   Yes [provider]  montelukast (SINGULAIR) 10 MG tablet Take 1 tablet (10 mg total) by mouth at bedtime. 03/11/17  Yes Mody, Ulice Bold, MD  Multiple Vitamins-Minerals (EYE VITAMINS PO) Take 1 tablet by mouth 2 (two) times daily.    Yes [provider]  senna-docusate (SENOKOT-S) 8.6-50 MG tablet Take 2 tablets by mouth 2 (two) times daily. 07/04/17  Yes Sudini, Alveta Heimlich, MD  spironolactone (ALDACTONE) 25 MG tablet Take 0.5 tablets (12.5 mg total) by mouth daily. 06/23/17  Yes Strader, [place], PA-C  tiotropium (SPIRIVA HANDIHALER) 18 MCG inhalation capsule Place 1 capsule (18 mcg total) into inhaler and inhale daily. 07/04/17 07/04/18 Yes Sudini, Alveta Heimlich, MD  torsemide (DEMADEX) 20 MG tablet Take one tablet daily. May take one extra tablet daily for weight gain >3lbs overnight or >5lbs in a week. Patient taking differently: 20-40 mg. Take one tablet daily. May take one extra tablet daily for weight gain >3lbs overnight or >5lbs in a week. 06/24/17  Yes Strader, [place] M, PA-C  guaiFENesin-dextromethorphan (ROBITUSSIN DM) 100-10 MG/5ML syrup Take 5 mLs every 4 (four) hours as needed by mouth for cough. Patient not taking: Reported on 08/20/2017 08/06/17   Demetrios Loll, MD  predniSONE (DELTASONE) 10 MG tablet 30 mg po daily for 1 day, 20 mg po daily for 1 day, 10 mg po daily for 1 day. Patient not taking: Reported on 08/20/2017 08/07/17   Demetrios Loll, MD   Physical Exam: Vitals:   08/20/17 0854 08/20/17 0900 08/20/17 0931 08/20/17 1000  BP:  (!) 106/93 (!) 117/59 122/72  Pulse:  (!) 113 (!) 103 (!) 107  Resp:  20 20 (!) 21  Temp: 98.4 F (36.9 C)     TempSrc: Axillary     SpO2:  98% 99% 99%  Weight:      Height:         General:  No apparent distress, WDWN,  [place]/AT  Eyes: PERRL, EOMI, no scleral icterus, conjunctiva clear  ENT: moist oropharynx without exudate, TM's benign, dentition good  Neck: supple, no lymphadenopathy. No bruits or thyromegaly  Cardiovascular: regular rate without MRG; 2+ peripheral pulses, no JVD, no peripheral edema  Respiratory: basilar rales with diffuse rhonchi. No wheezes or dullness. Respiratory effort increased  Abdomen: soft, non tender to palpation, positive bowel sounds, no guarding, no rebound  Skin: no rashes or lesions  Musculoskeletal: normal bulk and tone, no joint swelling  Psychiatric: normal mood and affect, A&OX3  Neurologic: CN 2-12 grossly intact, Motor strength 5/5 in all 4 groups with symmetric DTR's and non-focal sensory exam  Labs on Admission:  Basic Metabolic Panel: Recent Labs  Lab 08/20/17 0847  NA 142  K 4.4  CL 108  CO2 23  GLUCOSE 193*  BUN 42*  CREATININE 1.94*  CALCIUM 8.4*   Liver Function Tests: Recent Labs  Lab 08/20/17 0847  AST 36  ALT 27  ALKPHOS 47  BILITOT 0.7  PROT 6.4*  ALBUMIN 3.4*   No results for input(s): LIPASE, AMYLASE in the last 168 hours. No results for input(s): AMMONIA in the last 168 hours. CBC: Recent Labs  Lab 08/20/17 0847  WBC 19.0*  HGB 9.1*  HCT 28.6*  MCV 87.2  PLT 335   Cardiac Enzymes: Recent Labs  Lab 08/20/17 0847  TROPONINI 0.05*    BNP (last 3 results) Recent Labs    06/27/17 0235 08/03/17 0702 08/20/17 0847  BNP 272.0* 257.0* 215.0*    ProBNP (last 3 results) No results for input(s): PROBNP in the last 8760 hours.  CBG: No results for input(s): GLUCAP in the last 168 hours.  Radiological Exams on Admission: Dg Chest Portable 1 View  Result Date: 08/20/2017 CLINICAL DATA:  Respiratory distress.  Shortness of breath. EXAM: PORTABLE CHEST 1 VIEW COMPARISON:  08/03/2017.  06/27/2017. FINDINGS: Basilar chest densities, right side greater than left. Central vascular structures are prominent. Heart size  is enlarged and similar to the previous examination. Trachea is midline. Negative for a pneumothorax. Bone structures are intact. IMPRESSION: Prominent central vascular structures with bibasilar densities, right side greater than left. Findings are likely related to asymmetric pulmonary edema. However, an infectious etiology in the right lower lung cannot be excluded. Cardiomegaly. Electronically Signed   By: Markus Daft M.D.   On: 08/20/2017 09:12    EKG: Independently reviewed.  Assessment/Plan Principal Problem:   Acute respiratory failure with hypoxia (HCC) Active Problems:   Chronic combined systolic and diastolic heart failure (HCC)   Diabetes ([place])   Acute URI   Will admit to telemetry with IV ABX and IV Lasix with O2 and SVN's. Consult Cardiology and Pulmonology. Follow enzymes. Order echo. Follow sugars. Repeat labs in AM. Continue CPAP qhs  Diet: low salt, low carb Fluids: saline lock DVT Prophylaxis: Lovenox  Code Status: DNR  Family Communication: none  Disposition Plan: TBD  Time spent: 50 min

## 2017-08-20 NOTE — ED Provider Notes (Signed)
[facility] Emergency Department Provider Note  Time seen: 8:45 AM  I have reviewed the triage vital signs and the nursing notes.   HISTORY  Chief Complaint Respiratory Distress    HPI Angela Pham is a 76 y.o. female with a past medical history of asthma, CHF, COPD, hypertension, hyperlipidemia who presents to the emergency department for difficulty breathing.  According to the patient eat since early this morning she has been expensing difficulty breathing along with central chest discomfort.  Denies any swelling in her legs.  Denies any fever does state cough.  Patient states she wears oxygen at night/CPAP at night but does not wear oxygen during the daytime.  Patient arrives via EMS emergency traffic on CPAP, but no acute distress.   Past Medical History:  Diagnosis Date  . Asthma   . Chronic combined systolic and diastolic CHF (congestive heart failure) ([place])    a. TTE 02/2017: EF 45-50%, basal amd midanteroseptal HK b. 05/2017: echo showing EF of 35-40%, mild AI, moderate MR, and mildly dilated LA.   Marland Kitchen Coronary artery disease, non-occlusive    a. LHC 02/2017 normal coronary arteries  . Hyperlipidemia   . Hypertension   . Morbid obesity ([place])   . Pulmonary hypertension ([place])   . Type II diabetes mellitus [facility])     Patient Active Problem List   Diagnosis Date Noted  . Acute respiratory failure with hypoxia ([place]) 08/03/2017  . DNR (do not resuscitate) 07/02/2017  . Respiratory failure ([place]) 06/27/2017  . Chronic diastolic heart failure ([place]) 06/24/2017  . Asthma 06/24/2017  . Diabetes ([place]) 06/24/2017  . Essential hypertension 06/23/2017  . Hyperlipidemia, unspecified 06/23/2017  . CKD (chronic kidney disease) stage 3, GFR 30-59 ml/min (HCC) 06/23/2017  . Pressure injury of skin 06/12/2017  . COPD exacerbation ([place]) 05/28/2017  . SOB (shortness of breath)   . Chronic combined systolic and diastolic heart failure ([place]) 03/07/2017    Past Surgical  History:  Procedure Laterality Date  . c-section    . RIGHT/LEFT HEART CATH AND CORONARY ANGIOGRAPHY N/A 03/09/2017   Procedure: Right/Left Heart Cath and Coronary Angiography;  Surgeon: Minna Merritts, MD;  Location: [facility];  Service: Cardiovascular;  Laterality: N/A;    Prior to Admission medications   Medication Sig Start Date End Date Taking? Authorizing Provider  ALPRAZolam (XANAX) 0.25 MG tablet Take 1 tablet 3 (three) times daily by mouth. 06/10/17   [provider]  arformoterol (BROVANA) 15 MCG/2ML NEBU Take 2 mLs (15 mcg total) 2 (two) times daily by nebulization. DX: COPD J44.9 08/08/17   Wilhelmina Mcardle, MD  aspirin EC 81 MG EC tablet Take 1 tablet (81 mg total) by mouth daily. 03/12/17   [name], MD  atorvastatin (LIPITOR) 40 MG tablet Take 40 mg by mouth daily.    [provider]  budesonide (PULMICORT) 0.25 MG/2ML nebulizer solution Take 2 mLs (0.25 mg total) 2 (two) times daily by nebulization. DX: COPD J44.9 08/08/17   Wilhelmina Mcardle, MD  carvedilol (COREG) 6.25 MG tablet Take 1 tablet (6.25 mg total) by mouth 2 (two) times daily. 06/23/17   Strader, Fransisco Hertz, PA-C  guaiFENesin-dextromethorphan (ROBITUSSIN DM) 100-10 MG/5ML syrup Take 5 mLs every 4 (four) hours as needed by mouth for cough. 08/06/17   Demetrios Loll, MD  hydrALAZINE (APRESOLINE) 25 MG tablet Take 1 tablet (25 mg total) by mouth every 8 (eight) hours. 05/08/17   Vaughan Basta, MD  Ipratropium-Albuterol (COMBIVENT IN) Inhale 3  puffs into the lungs every 4 (four) hours as needed.    [provider]  ipratropium-albuterol (DUONEB) 0.5-2.5 (3) MG/3ML SOLN Inhale 3 mLs into the lungs every 4 (four) hours as needed.    [provider]  levocetirizine (XYZAL) 5 MG tablet Take 10 mg by mouth daily. 03/18/17   [provider]  metFORMIN (GLUCOPHAGE) 850 MG tablet Take 1 tablet by mouth 2 (two) times daily.    [provider]  mirtazapine  (REMERON) 45 MG tablet Take 1 tablet by mouth at bedtime.    [provider]  montelukast (SINGULAIR) 10 MG tablet Take 1 tablet (10 mg total) by mouth at bedtime. 03/11/17   [name], MD  Multiple Vitamins-Minerals (EYE VITAMINS PO) Take 1 tablet by mouth 2 (two) times daily.     [provider]  predniSONE (DELTASONE) 10 MG tablet 30 mg po daily for 1 day, 20 mg po daily for 1 day, 10 mg po daily for 1 day. 08/07/17   Demetrios Loll, MD  senna-docusate (SENOKOT-S) 8.6-50 MG tablet Take 2 tablets by mouth 2 (two) times daily. 07/04/17   Hillary Bow, MD  spironolactone (ALDACTONE) 25 MG tablet Take 0.5 tablets (12.5 mg total) by mouth daily. 06/23/17   Strader, Fransisco Hertz, PA-C  tiotropium (SPIRIVA HANDIHALER) 18 MCG inhalation capsule Place 1 capsule (18 mcg total) into inhaler and inhale daily. 07/04/17 07/04/18  Hillary Bow, MD  torsemide (DEMADEX) 20 MG tablet Take one tablet daily. May take one extra tablet daily for weight gain >3lbs overnight or >5lbs in a week. Patient taking differently: 20-40 mg. Take one tablet daily. May take one extra tablet daily for weight gain >3lbs overnight or >5lbs in a week. 06/24/17   Ahmed Prima, Fransisco Hertz, PA-C    Allergies  Allergen Reactions  . Ace Inhibitors Anaphylaxis    Tongue swelling  . Shellfish Allergy Hives and Swelling    Swelling around face and mouth     Family History  Problem Relation Age of Onset  . CAD Father   . Colon cancer Sister   . Leukemia Brother   . Throat cancer Brother   . Cervical cancer Sister     Social History Social History   Tobacco Use  . Smoking status: Former Smoker    Packs/day: 0.30    Years: 20.00    Pack years: 6.00    Types: Cigarettes  . Smokeless tobacco: Never Used  Substance Use Topics  . Alcohol use: No  . Drug use: No    Review of Systems Constitutional: Negative for fever. Cardiovascular: Mild central chest pain Respiratory: Positive for shortness of breath.  Positive  for cough. Gastrointestinal: Negative for abdominal pain Musculoskeletal: Negative for leg swelling All other ROS negative  ____________________________________________   PHYSICAL EXAM:  Constitutional: Alert and oriented.  Sitting upright in bed, CPAP in place but no acute distress. Eyes: Normal exam ENT   Head: Normocephalic and atraumatic   Mouth/Throat: Mucous membranes are moist. Cardiovascular: Regular rhythm, rate around 120.  No obvious murmur but difficult to auscultate on CPAP. Respiratory: Mild tachypnea, very diminished breath sounds bilaterally but no obvious wheeze rales or rhonchi. Gastrointestinal: Soft and nontender. No distention. Musculoskeletal: Nontender with normal range of motion in all extremities. No lower extremity tenderness or edema. Neurologic:  Normal speech and language. No gross focal neurologic deficits  Skin:  Skin is warm, dry and intact.  Psychiatric: Mood and affect are normal.   ____________________________________________    EKG  EKG reviewed and interpreted by myself shows sinus tachycardia at 121 bpm, slightly widened QRS with a normal axis, largely normal intervals besides slight QTC prolongation with nonspecific ST changes.  ____________________________________________    RADIOLOGY  Most consistent with asymmetrical pulmonary edema.  ____________________________________________   INITIAL IMPRESSION / ASSESSMENT AND PLAN / ED COURSE  Pertinent labs & imaging results that were available during my care of the patient were reviewed by me and considered in my medical decision making (see chart for details).   Patient presents to the emergency department with difficulty breathing that started early this morning.  Differential would include CHF exacerbation, COPD exacerbation, URI, asthma exacerbation, pneumonia or pneumothorax, ACS.  We will check labs including troponin, BNP.  We will obtain a portable chest x-ray.  We will  place the patient on BiPAP and continue to closely monitor in the emergency department.  The patient received 125 mg of Solu-Medrol by EMS we will dose to DuoNeb's.  I reviewed the patient's records, she was just discharged 08/06/17 after an admission for respiratory distress, pulmonary edema/CHF.  X-ray most consistent with asymmetric pulmonary edema.  Patient does have a leukocytosis and a slightly elevated troponin 0.05.  We will dose IV Lasix.  We will admit the patient to the hospitalist service for further treatment.  Patient is now off of BiPAP satting in the mid upper 90s on 4 L via nasal cannula.  CRITICAL CARE Performed by: Harvest Dark   Total critical care time: 30 minutes  Critical care time was exclusive of separately billable procedures and treating other patients.  Critical care was necessary to treat or prevent imminent or life-threatening deterioration.  Critical care was time spent personally by me on the following activities: development of treatment plan with patient and/or surrogate as well as nursing, discussions with consultants, evaluation of patient's response to treatment, examination of patient, obtaining history from patient or surrogate, ordering and performing treatments and interventions, ordering and review of laboratory studies, ordering and review of radiographic studies, pulse oximetry and re-evaluation of patient's condition.     ____________________________________________   FINAL CLINICAL IMPRESSION(S) / ED DIAGNOSES  Dyspnea CHF exacerbation   Harvest Dark, MD 08/20/17 1000

## 2017-08-20 NOTE — ED Notes (Signed)
[place] @ 4L trial beginning. Patient has no complaints of increased respiratory effort or difficulty.

## 2017-08-21 LAB — GLUCOSE, CAPILLARY
GLUCOSE-CAPILLARY: 180 mg/dL — AB (ref 65–99)
Glucose-Capillary: 139 mg/dL — ABNORMAL HIGH (ref 65–99)
Glucose-Capillary: 137 mg/dL — ABNORMAL HIGH (ref 65–99)
Glucose-Capillary: 131 mg/dL — ABNORMAL HIGH (ref 65–99)

## 2017-08-21 LAB — CBC
HEMATOCRIT: 22.9 % — AB (ref 35.0–47.0)
Hemoglobin: 7.6 g/dL — ABNORMAL LOW (ref 12.0–16.0)
MCH: 28.6 pg (ref 26.0–34.0)
MCHC: 33.2 g/dL (ref 32.0–36.0)
MCV: 85.9 fL (ref 80.0–100.0)
PLATELETS: 243 10*3/uL (ref 150–440)
RBC: 2.66 MIL/uL — AB (ref 3.80–5.20)
RDW: 17.7 % — AB (ref 11.5–14.5)
WBC: 11.5 10*3/uL — AB (ref 3.6–11.0)

## 2017-08-21 LAB — IRON AND TIBC
IRON: 12 ug/dL — AB (ref 28–170)
Saturation Ratios: 4 % — ABNORMAL LOW (ref 10.4–31.8)
TIBC: 344 ug/dL (ref 250–450)
UIBC: 332 ug/dL

## 2017-08-21 LAB — COMPREHENSIVE METABOLIC PANEL
ALK PHOS: 42 U/L (ref 38–126)
ALT: 20 U/L (ref 14–54)
AST: 19 U/L (ref 15–41)
Albumin: 2.9 g/dL — ABNORMAL LOW (ref 3.5–5.0)
Anion gap: 8 (ref 5–15)
BUN: 46 mg/dL — ABNORMAL HIGH (ref 6–20)
CO2: 26 mmol/L (ref 22–32)
CREATININE: 1.93 mg/dL — AB (ref 0.44–1.00)
Calcium: 7.6 mg/dL — ABNORMAL LOW (ref 8.9–10.3)
Chloride: 107 mmol/L (ref 101–111)
GFR, EST AFRICAN AMERICAN: 28 mL/min — AB (ref >60)
GFR, EST NON AFRICAN AMERICAN: 24 mL/min — AB (ref >60)
Glucose, Bld: 160 mg/dL — ABNORMAL HIGH (ref 65–99)
Potassium: 4.2 mmol/L (ref 3.5–5.1)
Sodium: 141 mmol/L (ref 135–145)
TOTAL PROTEIN: 5.6 g/dL — AB (ref 6.5–8.1)
Total Bilirubin: 0.3 mg/dL (ref 0.3–1.2)

## 2017-08-21 LAB — TROPONIN I: Troponin I: 0.09 ng/mL (ref <0.03)

## 2017-08-21 MED ORDER — IPRATROPIUM-ALBUTEROL 0.5-2.5 (3) MG/3ML IN SOLN
3.0000 mL | Freq: Three times a day (TID) | RESPIRATORY_TRACT | Status: DC
  Administered 2017-08-21 – 2017-08-23 (×7): 3 mL via RESPIRATORY_TRACT
  Filled 2017-08-21 (×7): qty 3

## 2017-08-21 NOTE — Progress Notes (Signed)
Pt's home cpap unit is set up with water. Pt's home cpap unit does not have an oxygen inlet instructed pt to keep Lucky on while she wears cpap unit tonight; pt agreed.

## 2017-08-21 NOTE — Progress Notes (Signed)
[place] [facility] [place] NAME: Angela Pham    MR#:  [id number]  DATE OF BIRTH:  06/17/1941  SUBJECTIVE:   Complains of shortness of breath although feels better than yesterday. REVIEW OF SYSTEMS:   Review of Systems  Constitutional: Negative for chills, fever and weight loss.  HENT: Negative for ear discharge, ear pain and nosebleeds.   Eyes: Negative for blurred vision, pain and discharge.  Respiratory: Positive for shortness of breath. Negative for sputum production, wheezing and stridor.   Cardiovascular: Negative for chest pain, palpitations, orthopnea and PND.  Gastrointestinal: Negative for abdominal pain, diarrhea, nausea and vomiting.  Genitourinary: Negative for frequency and urgency.  Musculoskeletal: Negative for back pain and joint pain.  Neurological: Positive for weakness. Negative for sensory change, speech change and focal weakness.  Psychiatric/Behavioral: Negative for depression and hallucinations. The patient is not nervous/anxious.    Tolerating Diet: Yes Tolerating PT: Ambulatory  DRUG ALLERGIES:   Allergies  Allergen Reactions  . Ace Inhibitors Anaphylaxis    Tongue swelling  . Shellfish Allergy Hives and Swelling    Swelling around face and mouth     VITALS:  Blood pressure 109/63, pulse 95, temperature 97.9 F (36.6 C), temperature source Oral, resp. rate 16, height 4\' 11"  (1.499 m), weight 80 kg (176 lb 6 oz), SpO2 100 %.  PHYSICAL EXAMINATION:   Physical Exam  GENERAL:  76 y.o.-year-old patient lying in the bed with no acute distress.  Obese  eYES: Pupils equal, round, reactive to light and accommodation. No scleral icterus. Extraocular muscles intact.  HEENT: Head atraumatic, normocephalic. Oropharynx and nasopharynx clear.  NECK:  Supple, no jugular venous distention. No thyroid enlargement, no tenderness.  LUNGS: Normal breath sounds bilaterally, no wheezing, rales, rhonchi. No use of accessory  muscles of respiration.  CARDIOVASCULAR: S1, S2 normal. No murmurs, rubs, or gallops.  ABDOMEN: Soft, nontender, nondistended. Bowel sounds present. No organomegaly or mass.  EXTREMITIES: No cyanosis, clubbing or edema b/l.    NEUROLOGIC: Cranial nerves II through XII are intact. No focal Motor or sensory deficits b/l.   PSYCHIATRIC:  patient is alert and oriented x 3.  SKIN: No obvious rash, lesion, or ulcer.   LABORATORY PANEL:  CBC Recent Labs  Lab 08/21/17 0033  WBC 11.5*  HGB 7.6*  HCT 22.9*  PLT 243    Chemistries  Recent Labs  Lab 08/21/17 0033  NA 141  K 4.2  CL 107  CO2 26  GLUCOSE 160*  BUN 46*  CREATININE 1.93*  CALCIUM 7.6*  AST 19  ALT 20  ALKPHOS 42  BILITOT 0.3   Cardiac Enzymes Recent Labs  Lab 08/21/17 0033  TROPONINI 0.09*   RADIOLOGY:  Dg Chest Portable 1 View  Result Date: 08/20/2017 CLINICAL DATA:  Respiratory distress.  Shortness of breath. EXAM: PORTABLE CHEST 1 VIEW COMPARISON:  08/03/2017.  06/27/2017. FINDINGS: Basilar chest densities, right side greater than left. Central vascular structures are prominent. Heart size is enlarged and similar to the previous examination. Trachea is midline. Negative for a pneumothorax. Bone structures are intact. IMPRESSION: Prominent central vascular structures with bibasilar densities, right side greater than left. Findings are likely related to asymmetric pulmonary edema. However, an infectious etiology in the right lower lung cannot be excluded. Cardiomegaly. Electronically Signed   By: Markus Daft M.D.   On: 08/20/2017 09:12   ASSESSMENT AND PLAN:  Angela Pham is a 76 y.o. female has a past medical history significant for  COPD, CAD, CHF, and OSA now with 3-4 day hx of progressive SOB. Has not been using CPAP or O2 at home due to technical reasons. Presents to in in severe distress with SOB and CP requiring BiPAP. After treatment in ER, pt is now lees SOB with no CP. Weaned off BiPAP to 4L O2 per [place]  1.  Acute on chronic respiratory failure with hypoxia-secondary to acute on chronic systolic CHF. Continue Lasix  continueDuoNeb's, Pulmicort nebs. Continue oxygen by nasal cannula. BiPAP at night as needed  2. COPD exacerbation--mild As above. -Patient is on oxygen 2 L at home. -Not wheezing no need for steroids  3. CHF-acute on chronic systolic CHF. Continue Lasix, follow I's and O's and daily weights. -Continue carvedilol, hydralazine,Aldactone.  4. Chronic kidney disease stage III-creatinine is close to baseline.Stable.  5. Diabetes type 2 without complication-hold metformin, Place on sliding scale insulin. Resume metformin after discharge.  6. Anxiety- on Xanax as needed.  7. Hyperlipidemia-continue atorvastatin.  8. Elevated troponin-secondary to supply demand ischemia from hypoxemia with CHF and COPD.   Care management for discharge planning  Case discussed with Care Management/Social Worker. Management plans discussed with the patient, family and they are in agreement.  CODE STATUS: DNR  DVT Prophylaxis: Lovenox  TOTAL TIME TAKING CARE OF THIS PATIENT: *30* minutes.  >50% time spent on counselling and coordination of care  POSSIBLE D/C IN 1-2 DAYS, DEPENDING ON CLINICAL CONDITION.  Note: This dictation was prepared with Dragon dictation along with smaller phrase technology. Any transcriptional errors that result from this process are unintentional.  Fritzi Mandes M.D on 08/21/2017 at 10:58 AM  Between 7am to 6pm - Pager - [phone]  After 6pm go to [email or web address] - password [email or web address] [facility]  Office  [phone]  CC: Primary care physician; Cletis Athens, MD

## 2017-08-22 ENCOUNTER — Inpatient Hospital Stay: Payer: 59 | Admitting: Internal Medicine

## 2017-08-22 DIAGNOSIS — J9621 Acute and chronic respiratory failure with hypoxia: Secondary | ICD-10-CM

## 2017-08-22 LAB — CBC
HEMATOCRIT: 25.2 % — AB (ref 35.0–47.0)
HEMOGLOBIN: 8 g/dL — AB (ref 12.0–16.0)
MCH: 27.3 pg (ref 26.0–34.0)
MCHC: 31.6 g/dL — ABNORMAL LOW (ref 32.0–36.0)
MCV: 86.3 fL (ref 80.0–100.0)
Platelets: 251 10*3/uL (ref 150–440)
RBC: 2.92 MIL/uL — ABNORMAL LOW (ref 3.80–5.20)
RDW: 17.8 % — AB (ref 11.5–14.5)
WBC: 11.7 10*3/uL — AB (ref 3.6–11.0)

## 2017-08-22 LAB — GLUCOSE, CAPILLARY
GLUCOSE-CAPILLARY: 142 mg/dL — AB (ref 65–99)
GLUCOSE-CAPILLARY: 139 mg/dL — AB (ref 65–99)
Glucose-Capillary: 153 mg/dL — ABNORMAL HIGH (ref 65–99)
Glucose-Capillary: 173 mg/dL — ABNORMAL HIGH (ref 65–99)

## 2017-08-22 LAB — CREATININE, SERUM
CREATININE: 1.63 mg/dL — AB (ref 0.44–1.00)
GFR calc Af Amer: 35 mL/min — ABNORMAL LOW (ref >60)
GFR, EST NON AFRICAN AMERICAN: 30 mL/min — AB (ref >60)

## 2017-08-22 MED ORDER — SODIUM CHLORIDE 0.9 % IV SOLN
300.0000 mg | Freq: Once | INTRAVENOUS | Status: AC
  Administered 2017-08-22: 300 mg via INTRAVENOUS
  Filled 2017-08-22: qty 15

## 2017-08-22 MED ORDER — SODIUM CHLORIDE 0.9% FLUSH
3.0000 mL | Freq: Two times a day (BID) | INTRAVENOUS | Status: DC
  Administered 2017-08-22 – 2017-08-23 (×3): 3 mL via INTRAVENOUS

## 2017-08-22 MED ORDER — SENNA 8.6 MG PO TABS
2.0000 | ORAL_TABLET | Freq: Two times a day (BID) | ORAL | Status: DC
  Administered 2017-08-22 – 2017-08-23 (×3): 17.2 mg via ORAL
  Filled 2017-08-22 (×3): qty 2

## 2017-08-22 MED ORDER — BISOPROLOL FUMARATE 5 MG PO TABS
5.0000 mg | ORAL_TABLET | Freq: Every day | ORAL | Status: DC
  Administered 2017-08-22 – 2017-08-23 (×2): 5 mg via ORAL
  Filled 2017-08-22 (×2): qty 1

## 2017-08-22 NOTE — Progress Notes (Signed)
[facility] [place] NAME: Angela Pham    MR#:  [id number]  DATE OF BIRTH:  1940-11-11  SUBJECTIVE:   Complaints of shortness of breath although feels better than yesterday. Eating BF REVIEW OF SYSTEMS:   Review of Systems  Constitutional: Negative for chills, fever and weight loss.  HENT: Negative for ear discharge, ear pain and nosebleeds.   Eyes: Negative for blurred vision, pain and discharge.  Respiratory: Positive for shortness of breath. Negative for sputum production, wheezing and stridor.   Cardiovascular: Negative for chest pain, palpitations, orthopnea and PND.  Gastrointestinal: Negative for abdominal pain, diarrhea, nausea and vomiting.  Genitourinary: Negative for frequency and urgency.  Musculoskeletal: Negative for back pain and joint pain.  Neurological: Positive for weakness. Negative for sensory change, speech change and focal weakness.  Psychiatric/Behavioral: Negative for depression and hallucinations. The patient is not nervous/anxious.    Tolerating Diet: Yes Tolerating PT: Ambulatory  DRUG ALLERGIES:   Allergies  Allergen Reactions  . Ace Inhibitors Anaphylaxis    Tongue swelling  . Shellfish Allergy Hives and Swelling    Swelling around face and mouth     VITALS:  Blood pressure 121/65, pulse (!) 101, temperature (!) 97.2 F (36.2 C), temperature source Oral, resp. rate (!) 24, height 4\' 11"  (1.499 m), weight 79.6 kg (175 lb 9 oz), SpO2 95 %.  PHYSICAL EXAMINATION:   Physical Exam  GENERAL:  76 y.o.-year-old patient lying in the bed with no acute distress.  Obese  eYES: Pupils equal, round, reactive to light and accommodation. No scleral icterus. Extraocular muscles intact.  HEENT: Head atraumatic, normocephalic. Oropharynx and nasopharynx clear.  NECK:  Supple, no jugular venous distention. No thyroid enlargement, no tenderness.  LUNGS: Normal breath sounds bilaterally, no wheezing, rales,  rhonchi. No use of accessory muscles of respiration.  CARDIOVASCULAR: S1, S2 normal. No murmurs, rubs, or gallops.  ABDOMEN: Soft, nontender, nondistended. Bowel sounds present. No organomegaly or mass.  EXTREMITIES: No cyanosis, clubbing or edema b/l.    NEUROLOGIC: Cranial nerves II through XII are intact. No focal Motor or sensory deficits b/l.   PSYCHIATRIC:  patient is alert and oriented x 3.  SKIN: No obvious rash, lesion, or ulcer.   LABORATORY PANEL:  CBC Recent Labs  Lab 08/22/17 0550  WBC 11.7*  HGB 8.0*  HCT 25.2*  PLT 251    Chemistries  Recent Labs  Lab 08/21/17 0033 08/22/17 0550  NA 141  --   K 4.2  --   CL 107  --   CO2 26  --   GLUCOSE 160*  --   BUN 46*  --   CREATININE 1.93* 1.63*  CALCIUM 7.6*  --   AST 19  --   ALT 20  --   ALKPHOS 42  --   BILITOT 0.3  --    Cardiac Enzymes Recent Labs  Lab 08/21/17 0033  TROPONINI 0.09*   RADIOLOGY:  No results found. ASSESSMENT AND PLAN:  Angela Pham is a 76 y.o. female has a past medical history significant for COPD, CAD, CHF, and OSA now with 3-4 day hx of progressive SOB. Has not been using CPAP or O2 at home due to technical reasons. Presents to in in severe distress with SOB and CP requiring BiPAP. After treatment in ER, pt is now lees SOB with no CP. Weaned off BiPAP to 4L O2 per [place]  1. Acute on chronic respiratory failure with hypoxia-secondary to acute  on chronic systolic CHF. Continue Lasix---GOOD UOP  continueDuoNeb's, Pulmicort nebs. Continue oxygen by nasal cannula. BiPAP at night as needed  2. COPD exacerbation--mild As above. -Patient is on oxygen 2 L at home. -Not wheezing no need for steroids  3. CHF-acute on chronic systolic CHF. Continue Lasix, follow I's and O's and daily weights. -Continue carvedilol, hydralazine,Aldactone.  4. Chronic kidney disease stage III-creatinine is close to baseline.Stable.  5. Diabetes type 2 without complication-hold metformin, Place on  sliding scale insulin. Resume metformin after discharge.  6. Anxiety- on Xanax as needed.  7. Hyperlipidemia-continue atorvastatin.  8. Elevated troponin-secondary to supply demand ischemia from hypoxemia with CHF and COPD.   9. Iron def anemia Iron levels low--will start IV Iron 300 x 1 today -pt intolerant to po iron. -she will f/u with Hematology as out pt  Care management for discharge planning  Case discussed with Care Management/Social Worker. Management plans discussed with the patient, family and they are in agreement.  CODE STATUS: DNR  DVT Prophylaxis: Lovenox  TOTAL TIME TAKING CARE OF THIS PATIENT: *30* minutes.  >50% time spent on counselling and coordination of care  POSSIBLE D/C IN 1-2 DAYS, DEPENDING ON CLINICAL CONDITION.  Note: This dictation was prepared with Dragon dictation along with smaller phrase technology. Any transcriptional errors that result from this process are unintentional.  Fritzi Mandes M.D on 08/22/2017 at 12:16 PM  Between 7am to 6pm - Pager - [phone]  After 6pm go to [email or web address] - password [email or web address] [facility]  Office  [phone]  CC: Primary care physician; Cletis Athens, MD

## 2017-08-22 NOTE — Progress Notes (Signed)
Pt called RT to explain something is missing from home cpap unit. Now, pt would like to try cpap unit provided by the hospital again. RT retrieves the cpap unit but now pt has refused the unit again, stating she wants to wait until tomorrow when someone brings in the piece for her home cpap unit.

## 2017-08-22 NOTE — Care Management (Signed)
Patient with 7  admissions in the past 6 months. Contacted Tim with [facility] to discuss interventions that have and can be made to decrease hospitalizations.  Last admission the pharmacist addressed concerns to CM that patient was not administering her inhalers/and or neb treatments as ordered- even though patient insists she was compliant with all of her meds. .  CM had requested specific documentation of this concern so could alert the home health agency of focus in assessments.  At present, do not see documentation of this assessment.  Patient was to have had been fitted for cpap 11.20.2018

## 2017-08-22 NOTE — Care Management Important Message (Signed)
Important Message  Patient Details  Name: Angela Pham MRN: [id number] Date of Birth: 06/04/1941   Medicare Important Message Given:  Yes Signed IM notice given  Katrina Stack, RN 08/22/2017, 12:17 PM

## 2017-08-22 NOTE — Care Management (Signed)
Patient was started on [place] 08/06/2017 and had it filled for the first time 11/10 at [facility] in [place]. The cost with insurance is 104.70. This is a medication that can be covered under medicare Part B but patient by chosing her specific medicare HMO plan, opted out of having the part b coverage that would cover this medication. This CM has spoken with patient on previous occasions about reassessing her medicare plans during this time of open enrollment.

## 2017-08-22 NOTE — Progress Notes (Signed)
Pt. Slept well through out the night with no c/o pain, SOB or acute distress observed. Will continue to monitor.

## 2017-08-22 NOTE — Plan of Care (Signed)
  Activity: Risk for activity intolerance will decrease 08/22/2017 1947 - Progressing by Gildardo Pounds, RN   Safety: Ability to remain free from injury will improve 08/22/2017 1947 - Progressing by Gildardo Pounds, RN

## 2017-08-22 NOTE — Care Management (Addendum)
Patient did receive her cpap but reports- Nobody showed her how to use it.  I do not know how to hook the oxygen to it. Notified [facility] who will come and speak with patient.  Will have [facility] reinforce any necessary instruction.  Patient has reported she can not afford her [place]

## 2017-08-22 NOTE — Consult Note (Signed)
Cardiology Consultation:   Patient ID: Angela Pham; [id number]; 09-Dec-1940   Admit date: 08/20/2017 Date of Consult: 08/22/2017  Primary Care Provider: Cletis Athens, MD Primary Cardiologist: Fletcher Anon   Patient Profile:   Angela Pham is a 76 y.o. female with a hx of chronic combined systolic and diastolic CHF (EF 82-99% by echo in 02/2017, declined to 35-40% in 05/2017), normal cors by cath in 02/2017, pulmonary HTN, Type 2 DM, HTN, HLD, and Stage 3 CKD  who is being seen today for the evaluation of AECOPD at the request of Dr. Doy Hutching, MD.  History of Present Illness:   Angela Pham was admitted to [facility] in June 2018, for SOB, after relocating to [facility] ~ 1 year prior to that. She underwent TTE on 03/08/2017 that showed EF of 45-50%, basal-anteroseptal HK, poorly visulaized aortic valve that was possibly bicuspid, mild AI, moderate MR directed posteriorly, mildly dilated left atrium, RV systolic function was normal, PASP moderately increased at 55-60 mmHg. She was diuresed and underwent R/LHC that showed normal coronary arteries with LVDEP 38-43, RA mean 22, RV 53/18/22, unable to advance the catheter to wedge pressure. She cancelled both her hospital follow up appointments with cardiology and CHF clinic. She was admitted again in early August for same symptoms. Treated with IV steroids, ABX for possible PNA, and diuresed. She was admitted for a 3rd time in early September for same symptoms, treated as AECOPD. she was admitted in late September from 9/15-9/20 for HCAP, AECOPD, and acute on chronic CHF with pulmonary hypertension. Echo showed EF 35-40%, mild AI, moderate MR, mildly dilated left atrium. She was diuresed with IV Lasix and weight at time of discharge was 191 pounds. She was admitted in October 2018 for acute on chronic respiratory failure felt 2/2 AECOPD more than CHF. She was treated with steroids, ABX, and inhalers per IM. She was admitted in early November, 2018 for same issue as October.  Cardiology was no consulted at that time. She was treated with steroids and nebs per IM.   She was most recently admitted on 11/24 with worsening SOB in the setting of AECOP, coughing up thick green/yellow sputum. No chest pain, orthopnea, LE swelling, abdominal distension, or PND. She initially required CPAP followed by escalation to BiPAP and has since been weaned off to [place]. CXR showed possible asymmetric pulmonary edema vs RLL PNA. Labs have shown a minimal troponin elevation of 0.09. Minimally elevated BNP of 215. Leukocytosis of 19,000 improved to 11,700 today. Renal function has again worsened with IV diuresis. Vitals stable. Currently, continues to cough up yellow sputum.    Past Medical History:  Diagnosis Date  . Asthma   . Chronic combined systolic and diastolic CHF (congestive heart failure) ([place])    a. TTE 02/2017: EF 45-50%, basal amd midanteroseptal HK b. 05/2017: echo showing EF of 35-40%, mild AI, moderate MR, and mildly dilated LA.   Marland Kitchen Coronary artery disease, non-occlusive    a. LHC 02/2017 normal coronary arteries  . Hyperlipidemia   . Hypertension   . Morbid obesity ([place])   . Pulmonary hypertension ([place])   . Type II diabetes mellitus ([place])     Past Surgical History:  Procedure Laterality Date  . c-section    . RIGHT/LEFT HEART CATH AND CORONARY ANGIOGRAPHY N/A 03/09/2017   Procedure: Right/Left Heart Cath and Coronary Angiography;  Surgeon: Minna Merritts, MD;  Location: [facility];  Service: Cardiovascular;  Laterality: N/A;     Home Meds: Prior  to Admission medications   Medication Sig Start Date End Date Taking? Authorizing Provider  ALPRAZolam Duanne Moron) 0.25 MG tablet Take 1 tablet by mouth 3 (three) times daily as needed for anxiety or sleep.  06/10/17  Yes [provider]  arformoterol (BROVANA) 15 MCG/2ML NEBU Take 2 mLs (15 mcg total) 2 (two) times daily by nebulization. DX: COPD J44.9 08/08/17  Yes Wilhelmina Mcardle, MD  aspirin EC 81 MG EC  tablet Take 1 tablet (81 mg total) by mouth daily. 03/12/17  Yes Mody, Ulice Bold, MD  atorvastatin (LIPITOR) 40 MG tablet Take 40 mg by mouth daily.   Yes [provider]  budesonide (PULMICORT) 0.25 MG/2ML nebulizer solution Take 2 mLs (0.25 mg total) 2 (two) times daily by nebulization. DX: COPD J44.9 08/08/17  Yes Wilhelmina Mcardle, MD  carvedilol (COREG) 6.25 MG tablet Take 1 tablet (6.25 mg total) by mouth 2 (two) times daily. 06/23/17  Yes Strader, [place] M, PA-C  hydrALAZINE (APRESOLINE) 25 MG tablet Take 1 tablet (25 mg total) by mouth every 8 (eight) hours. 05/08/17  Yes Vaughan Basta, MD  Ipratropium-Albuterol (COMBIVENT IN) Inhale 3 puffs into the lungs every 4 (four) hours as needed.   Yes [provider]  ipratropium-albuterol (DUONEB) 0.5-2.5 (3) MG/3ML SOLN Inhale 3 mLs into the lungs every 4 (four) hours as needed.   Yes [provider]  levocetirizine (XYZAL) 5 MG tablet Take 10 mg by mouth daily. 03/18/17  Yes [provider]  metFORMIN (GLUCOPHAGE) 850 MG tablet Take 1 tablet by mouth 3 (three) times daily.    Yes [provider]  mirtazapine (REMERON) 45 MG tablet Take 1 tablet by mouth at bedtime.   Yes [provider]  montelukast (SINGULAIR) 10 MG tablet Take 1 tablet (10 mg total) by mouth at bedtime. 03/11/17  Yes Mody, Ulice Bold, MD  Multiple Vitamins-Minerals (EYE VITAMINS PO) Take 1 tablet by mouth 2 (two) times daily.    Yes [provider]  senna-docusate (SENOKOT-S) 8.6-50 MG tablet Take 2 tablets by mouth 2 (two) times daily. 07/04/17  Yes Sudini, Alveta Heimlich, MD  spironolactone (ALDACTONE) 25 MG tablet Take 0.5 tablets (12.5 mg total) by mouth daily. 06/23/17  Yes Strader, Max, PA-C  tiotropium (SPIRIVA HANDIHALER) 18 MCG inhalation capsule Place 1 capsule (18 mcg total) into inhaler and inhale daily. 07/04/17 07/04/18 Yes Sudini, Alveta Heimlich, MD  torsemide (DEMADEX) 20 MG tablet Take one tablet daily. May take one  extra tablet daily for weight gain >3lbs overnight or >5lbs in a week. Patient taking differently: 20-40 mg. Take one tablet daily. May take one extra tablet daily for weight gain >3lbs overnight or >5lbs in a week. 06/24/17  Yes Strader, [place] M, PA-C  guaiFENesin-dextromethorphan (ROBITUSSIN DM) 100-10 MG/5ML syrup Take 5 mLs every 4 (four) hours as needed by mouth for cough. Patient not taking: Reported on 08/20/2017 08/06/17   Demetrios Loll, MD  predniSONE (DELTASONE) 10 MG tablet 30 mg po daily for 1 day, 20 mg po daily for 1 day, 10 mg po daily for 1 day. Patient not taking: Reported on 08/20/2017 08/07/17   Demetrios Loll, MD    Inpatient Medications: Scheduled Meds: . arformoterol  15 mcg Nebulization BID  . aspirin EC  81 mg Oral Daily  . atorvastatin  40 mg Oral Daily  . budesonide  0.25 mg Nebulization BID  . carvedilol  6.25 mg Oral BID  . docusate sodium  100 mg Oral BID  . furosemide  40 mg Intravenous  Q12H  . heparin subcutaneous  5,000 Units Subcutaneous Q8H  . hydrALAZINE  25 mg Oral Q8H  . insulin aspart  0-9 Units Subcutaneous TID WC  . ipratropium-albuterol  3 mL Nebulization TID  . loratadine  10 mg Oral Daily  . mirtazapine  45 mg Oral QHS  . montelukast  10 mg Oral QHS  . potassium chloride  20 mEq Oral BID  . spironolactone  12.5 mg Oral Daily  . tiotropium  18 mcg Inhalation Daily   Continuous Infusions: . iron sucrose     PRN Meds: acetaminophen **OR** acetaminophen, ALPRAZolam, bisacodyl, morphine injection, ondansetron **OR** ondansetron (ZOFRAN) IV  Allergies:   Allergies  Allergen Reactions  . Ace Inhibitors Anaphylaxis    Tongue swelling  . Shellfish Allergy Hives and Swelling    Swelling around face and mouth     Social History:   Social History   Socioeconomic History  . Marital status: Widowed    Spouse name: Not on file  . Number of children: Not on file  . Years of education: Not on file  . Highest education level: Not on file    Social Needs  . Financial resource strain: Not on file  . Food insecurity - worry: Not on file  . Food insecurity - inability: Not on file  . Transportation needs - medical: Not on file  . Transportation needs - non-medical: Not on file  Occupational History  . Not on file  Tobacco Use  . Smoking status: Former Smoker    Packs/day: 0.30    Years: 20.00    Pack years: 6.00    Types: Cigarettes  . Smokeless tobacco: Never Used  Substance and Sexual Activity  . Alcohol use: No  . Drug use: No  . Sexual activity: No  Other Topics Concern  . Not on file  Social History Narrative  . Not on file     Family History:  Family History  Problem Relation Age of Onset  . CAD Father   . Colon cancer Sister   . Leukemia Brother   . Throat cancer Brother   . Cervical cancer Sister     ROS:  Review of Systems  Constitutional: Positive for malaise/fatigue. Negative for chills, diaphoresis, fever and weight loss.  HENT: Negative for congestion.   Eyes: Negative for discharge and redness.  Respiratory: Positive for cough, sputum production, shortness of breath and wheezing. Negative for hemoptysis.        Yellow/green sputum  Cardiovascular: Negative for chest pain, palpitations, orthopnea, claudication, leg swelling and PND.  Gastrointestinal: Negative for abdominal pain, blood in stool, heartburn, melena, nausea and vomiting.  Genitourinary: Negative for hematuria.  Musculoskeletal: Negative for falls and myalgias.  Skin: Negative for rash.  Neurological: Positive for weakness. Negative for dizziness, tingling, tremors, sensory change, speech change, focal weakness and loss of consciousness.  Endo/Heme/Allergies: Does not bruise/bleed easily.  Psychiatric/Behavioral: Negative for substance abuse. The patient is not nervous/anxious.       Physical Exam/Data:   Vitals:   08/22/17 0500 08/22/17 0519 08/22/17 0731 08/22/17 0805  BP:  (!) 138/112 121/65   Pulse:  99 (!) 101    Resp:  (!) 24    Temp:  98 F (36.7 C) (!) 97.2 F (36.2 C)   TempSrc:  Oral Oral   SpO2:  98% 98% 95%  Weight: 175 lb 9 oz (79.6 kg)     Height:        Intake/Output Summary (Last 24 hours)  at 08/22/2017 0920 Last data filed at 08/22/2017 0537 Gross per 24 hour  Intake 960 ml  Output 1700 ml  Net -740 ml   Filed Weights   08/20/17 0843 08/21/17 0427 08/22/17 0500  Weight: 179 lb (81.2 kg) 176 lb 6 oz (80 kg) 175 lb 9 oz (79.6 kg)   Body mass index is 35.46 kg/m.   Physical Exam: General: Well developed, well nourished, in no acute distress. Head: Normocephalic, atraumatic, sclera non-icteric, no xanthomas, nares without discharge.  Neck: Negative for carotid bruits. JVD not elevated. Lungs: Coarse bilateral breath sounds with diffuse wheezing. Breathing is unlabored. Heart: RRR with S1 S2. No murmurs, rubs, or gallops appreciated. Abdomen: Soft, non-tender, non-distended with normoactive bowel sounds. No hepatomegaly. No rebound/guarding. No obvious abdominal masses. Msk:  Strength and tone appear normal for age. Extremities: No clubbing or cyanosis. No edema. Distal pedal pulses are 2+ and equal bilaterally. Neuro: Alert and oriented X 3. No facial asymmetry. No focal deficit. Moves all extremities spontaneously. Psych:  Responds to questions appropriately with a normal affect.   EKG:  The EKG was personally reviewed and demonstrates: sinus tachycardia, 121 bpm, nonspecific IVCD  Telemetry:  Telemetry was personally reviewed and demonstrates: NSR, 10 beats NSVT  Weights: Filed Weights   08/20/17 0843 08/21/17 0427 08/22/17 0500  Weight: 179 lb (81.2 kg) 176 lb 6 oz (80 kg) 175 lb 9 oz (79.6 kg)    Relevant CV Studies: [facility] 02/2017: Procedural Findings:   Coronary angiography:  Coronary dominance: Right  Left mainstem: Large vessel that bifurcates into the LAD and left circumflex, no significant disease noted  Left anterior descending (LAD): Large vessel  that extends to the apical region, diagonal branch 2 of moderate size, no significant disease noted  Left circumflex (LCx): Large vessel with OM branch 2, no significant disease noted  Right coronary artery (RCA): Right dominant vessel with PL and PDA, no significant disease noted  Left ventriculography: Not performed as she had recent echo and has underlying CRI  Final Conclusions:  Normal coronary arteries  Right heart pressures: RA: mean 22 RV: 53/18/22 RVEDP: 22 LVEDP 38-43 Unable to advance catheter to wedge pressure despite multiple attempts   Recommendations:  Normal coronary arteries Wall motion secondary to LBBB Markedly elevated LVEDP Elevated right atrial pressures Numbers consistent with chronic diastolic CHF Blood pressure markedly elevated , tachycardic Will D/c amlodipine, start verapamil to assist with rate control  TTE 05/2017: Study Conclusions  - Left ventricle: Systolic function was moderately reduced. The   estimated ejection fraction was in the range of 35% to 40%. - Aortic valve: There was mild regurgitation. - Mitral valve: There was moderate regurgitation. - Left atrium: The atrium was mildly dilated. - Atrial septum: Echo contrast study showed no right-to-left atrial   level shunt, at baseline or with provocation.  Laboratory Data:  Chemistry Recent Labs  Lab 08/20/17 0847 08/21/17 0033  NA 142 141  K 4.4 4.2  CL 108 107  CO2 23 26  GLUCOSE 193* 160*  BUN 42* 46*  CREATININE 1.94* 1.93*  CALCIUM 8.4* 7.6*  GFRNONAA 24* 24*  GFRAA 28* 28*  ANIONGAP 11 8    Recent Labs  Lab 08/20/17 0847 08/21/17 0033  PROT 6.4* 5.6*  ALBUMIN 3.4* 2.9*  AST 36 19  ALT 27 20  ALKPHOS 47 42  BILITOT 0.7 0.3   Hematology Recent Labs  Lab 08/20/17 0847 08/21/17 0033 08/22/17 0550  WBC 19.0* 11.5* 11.7*  RBC 3.27* 2.66*  2.92*  HGB 9.1* 7.6* 8.0*  HCT 28.6* 22.9* 25.2*  MCV 87.2 85.9 86.3  MCH 27.9 28.6 27.3  MCHC 32.0 33.2 31.6*   RDW 18.9* 17.7* 17.8*  PLT 335 243 251   Cardiac Enzymes Recent Labs  Lab 08/20/17 0847 08/20/17 1347 08/20/17 1842 08/21/17 0033  TROPONINI 0.05* 0.10* 0.08* 0.09*   No results for input(s): TROPIPOC in the last 168 hours.  BNP Recent Labs  Lab 08/20/17 0847  BNP 215.0*    DDimer No results for input(s): DDIMER in the last 168 hours.  Radiology/Studies:  Dg Chest Portable 1 View  Result Date: 08/20/2017 IMPRESSION: Prominent central vascular structures with bibasilar densities, right side greater than left. Findings are likely related to asymmetric pulmonary edema. However, an infectious etiology in the right lower lung cannot be excluded. Cardiomegaly. Electronically Signed   By: Markus Daft M.D.   On: 08/20/2017 09:12    Assessment and Plan:   1. Acute on chronic respiratory failure with hypoxia: -Secondary to AECOPD  -Supportive care per IM  2. Chronic combined CHF/NICM: -This is not a primary CHF admission -Less likely exacerbation at this time -Renal function worsened with IV Lasix -Stop IV Lasix (already received a dose this AM) -Resume PO torsemide 11/27 pending renal function -Weight is down 10 pounds from 06/2017 admission -Hold spironolactone in the setting of her AKI -Coreg -Not on ACEi/ARB/ARNI 2/2 angioedema  -No reason to order echo given the patient just had one in September  3. Acute on CKD stage III: -Stop IV Lasix and spironolactone  -Per IM  4. AECOPD: -Predominant reason for admission -Per IM  5. Elevated troponin: -Demand ischemia in the setting of the above -Normal cors by recent cath   For questions or updates, please contact [facility] Please consult [email or web address] for contact info under Cardiology/STEMI.   Signed, Christell Faith, PA-C [place] Pager: [phone] 08/22/2017, 9:20 AM

## 2017-08-22 NOTE — Progress Notes (Signed)
RT visualized pt's cpap machine. Pt is waiting on another piece to be able to use her cpap.

## 2017-08-23 ENCOUNTER — Telehealth: Payer: Self-pay | Admitting: *Deleted

## 2017-08-23 DIAGNOSIS — I5042 Chronic combined systolic (congestive) and diastolic (congestive) heart failure: Secondary | ICD-10-CM

## 2017-08-23 DIAGNOSIS — I428 Other cardiomyopathies: Secondary | ICD-10-CM

## 2017-08-23 DIAGNOSIS — J9601 Acute respiratory failure with hypoxia: Secondary | ICD-10-CM

## 2017-08-23 LAB — BASIC METABOLIC PANEL
Anion gap: 9 (ref 5–15)
BUN: 41 mg/dL — ABNORMAL HIGH (ref 6–20)
CALCIUM: 8 mg/dL — AB (ref 8.9–10.3)
CHLORIDE: 108 mmol/L (ref 101–111)
CO2: 24 mmol/L (ref 22–32)
CREATININE: 1.6 mg/dL — AB (ref 0.44–1.00)
GFR calc non Af Amer: 31 mL/min — ABNORMAL LOW (ref >60)
GFR, EST AFRICAN AMERICAN: 36 mL/min — AB (ref >60)
Glucose, Bld: 125 mg/dL — ABNORMAL HIGH (ref 65–99)
Potassium: 4.2 mmol/L (ref 3.5–5.1)
SODIUM: 141 mmol/L (ref 135–145)

## 2017-08-23 LAB — GLUCOSE, CAPILLARY
GLUCOSE-CAPILLARY: 132 mg/dL — AB (ref 65–99)
GLUCOSE-CAPILLARY: 157 mg/dL — AB (ref 65–99)

## 2017-08-23 MED ORDER — TORSEMIDE 20 MG PO TABS: ORAL_TABLET | ORAL | 3 refills | Status: DC

## 2017-08-23 MED ORDER — BISOPROLOL FUMARATE 5 MG PO TABS: 5.0000 mg | ORAL_TABLET | Freq: Every day | ORAL | 1 refills | Status: DC

## 2017-08-23 MED ORDER — POTASSIUM CHLORIDE CRYS ER 20 MEQ PO TBCR: 20.0000 meq | EXTENDED_RELEASE_TABLET | Freq: Every day | ORAL | 0 refills | Status: DC

## 2017-08-23 MED ORDER — IRON SUCROSE 20 MG/ML IV SOLN
300.0000 mg | Freq: Once | INTRAVENOUS | Status: AC
  Administered 2017-08-23: 300 mg via INTRAVENOUS
  Filled 2017-08-23: qty 15

## 2017-08-23 NOTE — Progress Notes (Signed)
Pt transported via wheelchair   

## 2017-08-23 NOTE — Progress Notes (Signed)
Received message from [facility] that Bisoprolol is on Back order Will switch pt back to coreg for now

## 2017-08-23 NOTE — Progress Notes (Signed)
Went over discharge instructions including medications and follow-up appointment with the patient. Discontinue peripheral IV and telemetry monitor. Will call volunteer for transport.

## 2017-08-23 NOTE — Care Management Note (Signed)
Case Management Note  Patient Details  Name: Syesha Thaw MRN: [id number] Date of Birth: 1941/05/05  Subjective/Objective:                 Discharge home today.  CPAP issues has been resolved by [facility].  Action/Plan:  Notified [facility] of  discharge Expected Discharge Date:  08/23/17               Expected Discharge Plan:     In-House Referral:     Discharge planning Services  CM Consult  Post Acute Care Choice:    Choice offered to:     DME Arranged:    DME Agency:     HH Arranged:  PT, OT, Nurse's Aide, RN [place] Agency:  [facility]  Status of Service:     If discussed at [employer], dates discussed:    Additional Comments:  Katrina Stack, RN 08/23/2017, 11:49 AM

## 2017-08-23 NOTE — Plan of Care (Signed)
  Adequate for Discharge Education: Knowledge of General Education information will improve 08/23/2017 1216 - Adequate for Discharge by Feliberto Gottron, RN Health Behavior/Discharge Planning: Ability to manage health-related needs will improve 08/23/2017 1216 - Adequate for Discharge by Feliberto Gottron, RN Clinical Measurements: Ability to maintain clinical measurements within normal limits will improve 08/23/2017 1216 - Adequate for Discharge by Chriss Czar, Illene Bolus, RN Will remain free from infection 08/23/2017 1216 - Adequate for Discharge by Feliberto Gottron, RN Diagnostic test results will improve 08/23/2017 1216 - Adequate for Discharge by Feliberto Gottron, RN Respiratory complications will improve 08/23/2017 1216 - Adequate for Discharge by Feliberto Gottron, RN Cardiovascular complication will be avoided 08/23/2017 1216 - Adequate for Discharge by Chriss Czar, Illene Bolus, RN Activity: Risk for activity intolerance will decrease 08/23/2017 1216 - Adequate for Discharge by Feliberto Gottron, RN Nutrition: Adequate nutrition will be maintained 08/23/2017 1216 - Adequate for Discharge by Feliberto Gottron, RN Coping: Level of anxiety will decrease 08/23/2017 1216 - Adequate for Discharge by Chriss Czar, Illene Bolus, RN Elimination: Will not experience complications related to bowel motility 08/23/2017 1216 - Adequate for Discharge by Chriss Czar, Illene Bolus, RN Will not experience complications related to urinary retention 08/23/2017 1216 - Adequate for Discharge by Feliberto Gottron, RN Pain Managment: General experience of comfort will improve 08/23/2017 1216 - Adequate for Discharge by Feliberto Gottron, RN Safety: Ability to remain free from injury will improve 08/23/2017 1216 - Adequate for Discharge by Feliberto Gottron, RN Skin Integrity: Risk for impaired skin integrity will  decrease 08/23/2017 1216 - Adequate for Discharge by Feliberto Gottron, RN

## 2017-08-23 NOTE — Telephone Encounter (Signed)
In order to process PA for Garlon Hatchet we will need a copy of patient Medicaid card. Holding PA until card obtained. Patient is currently in hospital.

## 2017-08-23 NOTE — Progress Notes (Signed)
Progress Note  Patient Name: Angela Pham Date of Encounter: 08/23/2017  Primary Cardiologist: Fletcher Anon  Subjective   Less SOB this morning. Used her CPAP overnight with good results. Still coughing up yellow sputum. No chest pain. HGB remains low, though stable. Received IV iron on 11/26. Renal function pending.   Inpatient Medications    Scheduled Meds: . arformoterol  15 mcg Nebulization BID  . aspirin EC  81 mg Oral Daily  . atorvastatin  40 mg Oral Daily  . bisoprolol  5 mg Oral Daily  . budesonide  0.25 mg Nebulization BID  . heparin subcutaneous  5,000 Units Subcutaneous Q8H  . hydrALAZINE  25 mg Oral Q8H  . insulin aspart  0-9 Units Subcutaneous TID WC  . ipratropium-albuterol  3 mL Nebulization TID  . loratadine  10 mg Oral Daily  . mirtazapine  45 mg Oral QHS  . montelukast  10 mg Oral QHS  . potassium chloride  20 mEq Oral BID  . senna  2 tablet Oral BID  . sodium chloride flush  3 mL Intravenous Q12H  . tiotropium  18 mcg Inhalation Daily   Continuous Infusions:  PRN Meds: acetaminophen **OR** acetaminophen, ALPRAZolam, bisacodyl, morphine injection, ondansetron **OR** ondansetron (ZOFRAN) IV   Vital Signs    Vitals:   08/22/17 0805 08/22/17 1934 08/22/17 2054 08/23/17 0357  BP:  (!) 121/56  115/60  Pulse:  93  92  Resp:  18  18  Temp:  98.2 F (36.8 C)  97.9 F (36.6 C)  TempSrc:  Oral  Oral  SpO2: 95% 99% 98% 93%  Weight:    177 lb 3.2 oz (80.4 kg)  Height:        Intake/Output Summary (Last 24 hours) at 08/23/2017 0813 Last data filed at 08/23/2017 0500 Gross per 24 hour  Intake 600 ml  Output 600 ml  Net 0 ml   Filed Weights   08/21/17 0427 08/22/17 0500 08/23/17 0357  Weight: 176 lb 6 oz (80 kg) 175 lb 9 oz (79.6 kg) 177 lb 3.2 oz (80.4 kg)    Telemetry    NSR - Personally Reviewed  ECG    n/a - Personally Reviewed  Physical Exam   GEN: No acute distress.   Neck: No JVD. Cardiac: RRR, no murmurs, rubs, or gallops.    Respiratory: Clear to auscultation bilaterally.  GI: Soft, nontender, non-distended.   MS: Trace bilateral pre-tibial edema; No deformity. Neuro:  Alert and oriented x 3; Nonfocal.  Psych: Normal affect.  Labs    Chemistry Recent Labs  Lab 08/20/17 0847 08/21/17 0033 08/22/17 0550  NA 142 141  --   K 4.4 4.2  --   CL 108 107  --   CO2 23 26  --   GLUCOSE 193* 160*  --   BUN 42* 46*  --   CREATININE 1.94* 1.93* 1.63*  CALCIUM 8.4* 7.6*  --   PROT 6.4* 5.6*  --   ALBUMIN 3.4* 2.9*  --   AST 36 19  --   ALT 27 20  --   ALKPHOS 47 42  --   BILITOT 0.7 0.3  --   GFRNONAA 24* 24* 30*  GFRAA 28* 28* 35*  ANIONGAP 11 8  --      Hematology Recent Labs  Lab 08/20/17 0847 08/21/17 0033 08/22/17 0550  WBC 19.0* 11.5* 11.7*  RBC 3.27* 2.66* 2.92*  HGB 9.1* 7.6* 8.0*  HCT 28.6* 22.9* 25.2*  MCV 87.2  85.9 86.3  MCH 27.9 28.6 27.3  MCHC 32.0 33.2 31.6*  RDW 18.9* 17.7* 17.8*  PLT 335 243 251    Cardiac Enzymes Recent Labs  Lab 08/20/17 0847 08/20/17 1347 08/20/17 1842 08/21/17 0033  TROPONINI 0.05* 0.10* 0.08* 0.09*   No results for input(s): TROPIPOC in the last 168 hours.   BNP Recent Labs  Lab 08/20/17 0847  BNP 215.0*     DDimer No results for input(s): DDIMER in the last 168 hours.   Radiology    No results found.  Cardiac Studies   [facility] 02/2017: Procedural Findings:   Coronary angiography:  Coronary dominance: Right  Left mainstem: Large vessel that bifurcates into the LAD and left circumflex, no significant disease noted  Left anterior descending (LAD): Large vessel that extends to the apical region, diagonal branch 2 of moderate size, no significant disease noted  Left circumflex (LCx): Large vessel with OM branch 2, no significant disease noted  Right coronary artery (RCA): Right dominant vessel with PL and PDA, no significant disease noted  Left ventriculography: Not performed as she had recent echo and has underlying  CRI  Final Conclusions:  Normal coronary arteries  Right heart pressures: RA: mean 22 RV: 53/18/22 RVEDP: 22 LVEDP 38-43 Unable to advance catheter to wedge pressure despite multiple attempts   Recommendations:  Normal coronary arteries Wall motion secondary to LBBB Markedly elevated LVEDP Elevated right atrial pressures Numbers consistent with chronic diastolic CHF Blood pressure markedly elevated , tachycardic Will D/c amlodipine, start verapamil to assist with rate control  TTE 05/2017: Study Conclusions  - Left ventricle: Systolic function was moderately reduced. The estimated ejection fraction was in the range of 35% to 40%. - Aortic valve: There was mild regurgitation. - Mitral valve: There was moderate regurgitation. - Left atrium: The atrium was mildly dilated. - Atrial septum: Echo contrast study showed no right-to-left atrial level shunt, at baseline or with provocation.   Patient Profile     76 y.o. female with history of chronic combined systolic and diastolic CHF (EF 56-43% by echo in 02/2017, declined to 35-40% in 05/2017), normal cors by cath in 02/2017, pulmonary HTN, Type 2 DM, HTN, HLD, and Stage 3 CKD  who is being seen today for the evaluation of AECOPD  Assessment & Plan    1. Acute on chronic respiratory failure with hypoxia: -Secondary to AECOPD  -Supportive care per IM  2. Chronic combined CHF/NICM: -This is not a primary CHF admission -Less likely exacerbation at this time -Renal function worsened with IV Lasix, pending this morning -Continue to hold diuretic until renal function has been completed this morning -Resume PO torsemide pending renal function -Weight is down 10 pounds from 06/2017 admission -Continue to hold spironolactone in the setting of her AKI -Coreg -Not on ACEi/ARB/ARNI 2/2 angioedema  -No reason to order echo given the patient just had one in September  3. Acute on CKD stage III: -Continue to hold IV Lasix  and spironolactone  -Per IM  4. AECOPD: -Predominant reason for admission -Per IM  5. Elevated troponin: -Demand ischemia in the setting of the above -Normal cors by recent cath  6. Iron deficiency anemia: -Likely playing a significant role in her SOB -Needs workup -Maintain HGB > 8-8.5 -IV iron, she is intolerant to PO -Per IM    For questions or updates, please contact [facility] Please consult [email or web address] for contact info under Cardiology/STEMI.    Signed, Christell Faith, PA-C [place] Pager: [phone] 08/23/2017,  8:13 AM

## 2017-08-23 NOTE — Care Management (Signed)
patient verbalized to CM that she has not received any home health visits since her previous discharge around 11/10.  CM spoke with [facility].  Patient has not answered the phone or returned voicemail messages.  Agency performed drive bys to the home and no answer to knock at door.  Time with [facility] will speak directly with patient to discuss her availsability

## 2017-08-23 NOTE — Discharge Summary (Addendum)
Angela Pham    MR#:  [id number]  DATE OF BIRTH:  06/02/1941  DATE OF ADMISSION:  08/20/2017 ADMITTING PHYSICIAN: Idelle Crouch, MD  DATE OF DISCHARGE: 08/23/2017 PRIMARY CARE PHYSICIAN: Cletis Athens, MD    ADMISSION DIAGNOSIS:  Acute pulmonary edema ([place]) [J81.0] Congestive heart failure, unspecified HF chronicity, unspecified heart failure type ([place]) [I50.9]  DISCHARGE DIAGNOSIS:  Acute on Chronic Systolic CHF  SECONDARY DIAGNOSIS:   Past Medical History:  Diagnosis Date  . Asthma   . Chronic combined systolic and diastolic CHF (congestive heart failure) ([place])    a. TTE 02/2017: EF 45-50%, basal amd midanteroseptal HK b. 05/2017: echo showing EF of 35-40%, mild AI, moderate MR, and mildly dilated LA.   Marland Kitchen Coronary artery disease, non-occlusive    a. LHC 02/2017 normal coronary arteries  . Hyperlipidemia   . Hypertension   . Morbid obesity ([place])   . Pulmonary hypertension ([place])   . Type II diabetes mellitus [facility])     HOSPITAL COURSE:   Angela Pham a 76 y.o.femalehas a past medical history significant forCOPD, CAD, CHF, and OSA now with 3-4 day hx of progressive SOB. Has not been using CPAP or O2 at home due to technical reasons. Presents to in in severe distress with SOB and CP requiring BiPAP. After treatment in ER, pt is now lees SOB with no CP. Weaned off BiPAP to 4L O2 per [place]  1. Acute on chronic respiratory failure with hypoxia-secondary to acute on chronic systolic CHF. Continue Lasix---GOOD UOP  continueDuoNeb's, Pulmicort nebs. Continue oxygen by nasal cannula. Pt now using CPAP Appears at baseline Torsemide 20 mg bid for 1 week and then daily  2. COPD exacerbation--mild As above. -Patient is on oxygen 2 L at home. -Not wheezing no need for steroids  3. CHF-acute on chronic systolic CHF. Continue Lasix, follow I's and O's and daily weights. -Continue bisoprolol,  hydralazine,Aldactone.  4. Chronic kidney disease stage III-creatinine is close to baseline.Stable.  5. Diabetes type 2 without complication-hold metformin, Place on sliding scale insulin.Resumemetformin after discharge.  6. Anxiety-on Xanax as needed.  7. Hyperlipidemia-continue atorvastatin.  8. Elevated troponin-secondary to supply demand ischemia from hypoxemia with CHF and COPD.  9. Iron def anemia Iron levels low--will start IV Iron 300 x 2 (doses) -pt intolerant to po iron. -she will f/u with Hematology as out pt--Dr Mike Gip (on call today)  Care management for discharge planning  D/c home  CONSULTS OBTAINED:  Treatment Team:  Wilhelmina Mcardle, MD Dimas Chyle, MD Wellington Hampshire, MD  DRUG ALLERGIES:   Allergies  Allergen Reactions  . Ace Inhibitors Anaphylaxis    Tongue swelling  . Shellfish Allergy Hives and Swelling    Swelling around face and mouth     DISCHARGE MEDICATIONS:   Current Discharge Medication List    START taking these medications   Details  potassium chloride SA (K-DUR,KLOR-CON) 20 MEQ tablet Take 1 tablet (20 mEq total) by mouth daily. Qty: 30 tablet, Refills: 0      CONTINUE these medications which have CHANGED   Details  torsemide (DEMADEX) 20 MG tablet Take one tablet twicw a day for 1 week and then change it to daily. May take one extra tablet daily for weight gain >3lbs overnight or >5lbs in a week. Qty: 60 tablet, Refills: 3   Associated Diagnoses: Chronic diastolic heart failure ([place])      CONTINUE these medications which  have NOT CHANGED   Details  ALPRAZolam (XANAX) 0.25 MG tablet Take 1 tablet by mouth 3 (three) times daily as needed for anxiety or sleep.     arformoterol (BROVANA) 15 MCG/2ML NEBU Take 2 mLs (15 mcg total) 2 (two) times daily by nebulization. DX: COPD J44.9 Qty: 60 mL, Refills: 0    aspirin EC 81 MG EC tablet Take 1 tablet (81 mg total) by mouth daily.    atorvastatin (LIPITOR) 40 MG  tablet Take 40 mg by mouth daily.    budesonide (PULMICORT) 0.25 MG/2ML nebulizer solution Take 2 mLs (0.25 mg total) 2 (two) times daily by nebulization. DX: COPD J44.9 Qty: 120 mL, Refills: 0    carvedilol (COREG) 6.25 MG tablet Take 1 tablet (6.25 mg total) by mouth 2 (two) times daily. Qty: 180 tablet, Refills: 3    hydrALAZINE (APRESOLINE) 25 MG tablet Take 1 tablet (25 mg total) by mouth every 8 (eight) hours. Qty: 90 tablet, Refills: 0    Ipratropium-Albuterol (COMBIVENT IN) Inhale 3 puffs into the lungs every 4 (four) hours as needed.    ipratropium-albuterol (DUONEB) 0.5-2.5 (3) MG/3ML SOLN Inhale 3 mLs into the lungs every 4 (four) hours as needed.    levocetirizine (XYZAL) 5 MG tablet Take 10 mg by mouth daily.    metFORMIN (GLUCOPHAGE) 850 MG tablet Take 1 tablet by mouth 3 (three) times daily.     mirtazapine (REMERON) 45 MG tablet Take 1 tablet by mouth at bedtime.    montelukast (SINGULAIR) 10 MG tablet Take 1 tablet (10 mg total) by mouth at bedtime. Qty: 30 tablet, Refills: 0    Multiple Vitamins-Minerals (EYE VITAMINS PO) Take 1 tablet by mouth 2 (two) times daily.     senna-docusate (SENOKOT-S) 8.6-50 MG tablet Take 2 tablets by mouth 2 (two) times daily. Qty: 120 tablet, Refills: 0    spironolactone (ALDACTONE) 25 MG tablet Take 0.5 tablets (12.5 mg total) by mouth daily. Qty: 45 tablet, Refills: 1    tiotropium (SPIRIVA HANDIHALER) 18 MCG inhalation capsule Place 1 capsule (18 mcg total) into inhaler and inhale daily. Qty: 30 capsule, Refills: 0      STOP taking these medications     guaiFENesin-dextromethorphan (ROBITUSSIN DM) 100-10 MG/5ML syrup      predniSONE (DELTASONE) 10 MG tablet         If you experience worsening of your admission symptoms, develop shortness of breath, life threatening emergency, suicidal or homicidal thoughts you must seek medical attention immediately by calling 911 or calling your MD immediately  if symptoms less  severe.  You Must read complete instructions/literature along with all the possible adverse reactions/side effects for all the Medicines you take and that have been prescribed to you. Take any new Medicines after you have completely understood and accept all the possible adverse reactions/side effects.   Please note  You were cared for by a hospitalist during your hospital stay. If you have any questions about your discharge medications or the care you received while you were in the hospital after you are discharged, you can call the unit and asked to speak with the hospitalist on call if the hospitalist that took care of you is not available. Once you are discharged, your primary care physician will handle any further medical issues. Please note that NO REFILLS for any discharge medications will be authorized once you are discharged, as it is imperative that you return to your primary care physician (or establish a relationship with a primary care  physician if you do not have one) for your aftercare needs so that they can reassess your need for medications and monitor your lab values. Today   SUBJECTIVE   Doing well  VITAL SIGNS:  Blood pressure (!) 107/59, pulse 91, temperature 98.1 F (36.7 C), resp. rate 18, height 4\' 11"  (1.499 m), weight 80.4 kg (177 lb 3.2 oz), SpO2 96 %.  I/O:    Intake/Output Summary (Last 24 hours) at 08/23/2017 1124 Last data filed at 08/23/2017 0500 Gross per 24 hour  Intake 240 ml  Output 600 ml  Net -360 ml    PHYSICAL EXAMINATION:  GENERAL:  76 y.o.-year-old patient lying in the bed with no acute distress. obese EYES: Pupils equal, round, reactive to light and accommodation. No scleral icterus. Extraocular muscles intact.  HEENT: Head atraumatic, normocephalic. Oropharynx and nasopharynx clear.  NECK:  Supple, no jugular venous distention. No thyroid enlargement, no tenderness.  LUNGS: Normal breath sounds bilaterally, no wheezing, rales,rhonchi or  crepitation. No use of accessory muscles of respiration.  CARDIOVASCULAR: S1, S2 normal. No murmurs, rubs, or gallops.  ABDOMEN: Soft, non-tender, non-distended. Bowel sounds present. No organomegaly or mass.  EXTREMITIES: No pedal edema, cyanosis, or clubbing.  NEUROLOGIC: Cranial nerves II through XII are intact. Muscle strength 5/5 in all extremities. Sensation intact. Gait not checked.  PSYCHIATRIC: The patient is alert and oriented x 3.  SKIN: No obvious rash, lesion, or ulcer.   DATA REVIEW:   CBC  Recent Labs  Lab 08/22/17 0550  WBC 11.7*  HGB 8.0*  HCT 25.2*  PLT 251    Chemistries  Recent Labs  Lab 08/21/17 0033 08/22/17 0550  NA 141  --   K 4.2  --   CL 107  --   CO2 26  --   GLUCOSE 160*  --   BUN 46*  --   CREATININE 1.93* 1.63*  CALCIUM 7.6*  --   AST 19  --   ALT 20  --   ALKPHOS 42  --   BILITOT 0.3  --     Microbiology Results   No results found for this or any previous visit (from the past 240 hour(s)).  RADIOLOGY:  No results found.   Management plans discussed with the patient, family and they are in agreement.  CODE STATUS:     Code Status Orders  (From admission, onward)        Start     Ordered   08/20/17 1249  Do not attempt resuscitation (DNR)  Continuous    Question Answer Comment  In the event of cardiac or respiratory ARREST Do not call a "code blue"   In the event of cardiac or respiratory ARREST Do not perform Intubation, CPR, defibrillation or ACLS   In the event of cardiac or respiratory ARREST Use medication by any route, position, wound care, and other measures to relive pain and suffering. May use oxygen, suction and manual treatment of airway obstruction as needed for comfort.      08/20/17 1249    Code Status History    Date Active Date Inactive Code Status Order ID Comments User Context   08/03/2017 14:09 08/06/2017 19:12 Full Code [id number]  Henreitta Leber, MD ED   06/27/2017 04:29 07/04/2017 16:59 DNR [id number]   Saundra Shelling, MD Inpatient   06/13/2017 04:25 06/16/2017 18:16 DNR [id number]  Mikael Spray, NP Inpatient   06/12/2017 03:28 06/13/2017 04:25 Full Code [id number]  Harvie Bridge, DO Inpatient   06/12/2017  02:00 06/12/2017 03:28 DNR [id number]  Harvie Bridge, DO ED   05/29/2017 02:49 06/02/2017 21:23 DNR [id number]  Verlin Dike, RN Inpatient   05/28/2017 22:59 05/29/2017 02:49 Full Code [id number]  Harvie Bridge, DO Inpatient   05/04/2017 17:19 05/08/2017 17:18 DNR [id number]  Loletha Grayer, MD ED   03/09/2017 12:34 03/11/2017 22:08 Full Code [id number]  Minna Merritts, MD Inpatient   03/07/2017 05:12 03/09/2017 12:34 Full Code [id number]  Saundra Shelling, MD Inpatient    Advance Directive Documentation     Most Recent Value  Type of Advance Directive  Healthcare Power of Attorney, Living will, Out of facility DNR (pink MOST or yellow form)  Pre-existing out of facility DNR order (yellow form or pink MOST form)  No data  "MOST" Form in Place?  No data      TOTAL TIME TAKING CARE OF THIS PATIENT: 40  minutes.    Fritzi Mandes M.D on 08/23/2017 at 11:24 AM  Between 7am to 6pm - Pager - [phone] After 6pm go to [email or web address] - password [email or web address] [place] [facility]  Office  [phone]  CC: Primary care physician; Cletis Athens, MD

## 2017-08-24 ENCOUNTER — Emergency Department: Payer: 59

## 2017-08-24 ENCOUNTER — Encounter: Payer: Self-pay | Admitting: Intensive Care

## 2017-08-24 ENCOUNTER — Other Ambulatory Visit: Payer: Self-pay

## 2017-08-24 ENCOUNTER — Inpatient Hospital Stay
Admission: EM | Admit: 2017-08-24 | Discharge: 2017-08-26 | DRG: 190 | Disposition: A | Discharge status: Skilled Nursing Facility | Payer: 59 | Attending: Internal Medicine | Admitting: Internal Medicine

## 2017-08-24 DIAGNOSIS — Z66 Do not resuscitate: Secondary | ICD-10-CM | POA: Diagnosis present

## 2017-08-24 DIAGNOSIS — J189 Pneumonia, unspecified organism: Secondary | ICD-10-CM | POA: Diagnosis present

## 2017-08-24 DIAGNOSIS — I5042 Chronic combined systolic (congestive) and diastolic (congestive) heart failure: Secondary | ICD-10-CM | POA: Diagnosis present

## 2017-08-24 DIAGNOSIS — J441 Chronic obstructive pulmonary disease with (acute) exacerbation: Secondary | ICD-10-CM

## 2017-08-24 DIAGNOSIS — Z515 Encounter for palliative care: Secondary | ICD-10-CM | POA: Diagnosis not present

## 2017-08-24 DIAGNOSIS — G4733 Obstructive sleep apnea (adult) (pediatric): Secondary | ICD-10-CM | POA: Diagnosis present

## 2017-08-24 DIAGNOSIS — E1122 Type 2 diabetes mellitus with diabetic chronic kidney disease: Secondary | ICD-10-CM | POA: Diagnosis present

## 2017-08-24 DIAGNOSIS — Z8249 Family history of ischemic heart disease and other diseases of the circulatory system: Secondary | ICD-10-CM | POA: Diagnosis not present

## 2017-08-24 DIAGNOSIS — I429 Cardiomyopathy, unspecified: Secondary | ICD-10-CM | POA: Diagnosis present

## 2017-08-24 DIAGNOSIS — D509 Iron deficiency anemia, unspecified: Secondary | ICD-10-CM | POA: Diagnosis present

## 2017-08-24 DIAGNOSIS — Z91013 Allergy to seafood: Secondary | ICD-10-CM | POA: Diagnosis not present

## 2017-08-24 DIAGNOSIS — F419 Anxiety disorder, unspecified: Secondary | ICD-10-CM | POA: Diagnosis present

## 2017-08-24 DIAGNOSIS — Z7189 Other specified counseling: Secondary | ICD-10-CM | POA: Diagnosis not present

## 2017-08-24 DIAGNOSIS — E785 Hyperlipidemia, unspecified: Secondary | ICD-10-CM | POA: Diagnosis present

## 2017-08-24 DIAGNOSIS — Z7951 Long term (current) use of inhaled steroids: Secondary | ICD-10-CM

## 2017-08-24 DIAGNOSIS — I130 Hypertensive heart and chronic kidney disease with heart failure and stage 1 through stage 4 chronic kidney disease, or unspecified chronic kidney disease: Secondary | ICD-10-CM | POA: Diagnosis present

## 2017-08-24 DIAGNOSIS — N183 Chronic kidney disease, stage 3 (moderate): Secondary | ICD-10-CM | POA: Diagnosis present

## 2017-08-24 DIAGNOSIS — I272 Pulmonary hypertension, unspecified: Secondary | ICD-10-CM | POA: Diagnosis present

## 2017-08-24 DIAGNOSIS — Z6835 Body mass index (BMI) 35.0-35.9, adult: Secondary | ICD-10-CM

## 2017-08-24 DIAGNOSIS — J9600 Acute respiratory failure, unspecified whether with hypoxia or hypercapnia: Secondary | ICD-10-CM | POA: Diagnosis present

## 2017-08-24 DIAGNOSIS — Z7982 Long term (current) use of aspirin: Secondary | ICD-10-CM

## 2017-08-24 DIAGNOSIS — Z888 Allergy status to other drugs, medicaments and biological substances status: Secondary | ICD-10-CM | POA: Diagnosis not present

## 2017-08-24 DIAGNOSIS — I2510 Atherosclerotic heart disease of native coronary artery without angina pectoris: Secondary | ICD-10-CM | POA: Diagnosis present

## 2017-08-24 DIAGNOSIS — Z79899 Other long term (current) drug therapy: Secondary | ICD-10-CM | POA: Diagnosis not present

## 2017-08-24 DIAGNOSIS — E669 Obesity, unspecified: Secondary | ICD-10-CM | POA: Diagnosis present

## 2017-08-24 DIAGNOSIS — Z7984 Long term (current) use of oral hypoglycemic drugs: Secondary | ICD-10-CM

## 2017-08-24 DIAGNOSIS — Z87891 Personal history of nicotine dependence: Secondary | ICD-10-CM | POA: Diagnosis not present

## 2017-08-24 DIAGNOSIS — J9601 Acute respiratory failure with hypoxia: Secondary | ICD-10-CM | POA: Diagnosis not present

## 2017-08-24 DIAGNOSIS — R0902 Hypoxemia: Secondary | ICD-10-CM | POA: Diagnosis not present

## 2017-08-24 DIAGNOSIS — J9621 Acute and chronic respiratory failure with hypoxia: Secondary | ICD-10-CM | POA: Diagnosis present

## 2017-08-24 DIAGNOSIS — J440 Chronic obstructive pulmonary disease with acute lower respiratory infection: Secondary | ICD-10-CM | POA: Diagnosis present

## 2017-08-24 DIAGNOSIS — Z9981 Dependence on supplemental oxygen: Secondary | ICD-10-CM

## 2017-08-24 LAB — BASIC METABOLIC PANEL
ANION GAP: 11 (ref 5–15)
BUN: 39 mg/dL — ABNORMAL HIGH (ref 6–20)
CALCIUM: 8.9 mg/dL (ref 8.9–10.3)
CHLORIDE: 107 mmol/L (ref 101–111)
CO2: 23 mmol/L (ref 22–32)
Creatinine, Ser: 1.53 mg/dL — ABNORMAL HIGH (ref 0.44–1.00)
GFR calc non Af Amer: 32 mL/min — ABNORMAL LOW (ref >60)
GFR, EST AFRICAN AMERICAN: 38 mL/min — AB (ref >60)
Glucose, Bld: 150 mg/dL — ABNORMAL HIGH (ref 65–99)
Potassium: 4.5 mmol/L (ref 3.5–5.1)
Sodium: 141 mmol/L (ref 135–145)

## 2017-08-24 LAB — CBC WITH DIFFERENTIAL/PLATELET
BASOS ABS: 0 10*3/uL (ref 0–0.1)
BASOS PCT: 0 %
Eosinophils Absolute: 0.1 10*3/uL (ref 0–0.7)
Eosinophils Relative: 1 %
HEMATOCRIT: 25.4 % — AB (ref 35.0–47.0)
HEMOGLOBIN: 8 g/dL — AB (ref 12.0–16.0)
Lymphocytes Relative: 7 %
Lymphs Abs: 0.8 10*3/uL — ABNORMAL LOW (ref 1.0–3.6)
MCH: 27.7 pg (ref 26.0–34.0)
MCHC: 31.7 g/dL — AB (ref 32.0–36.0)
MCV: 87.2 fL (ref 80.0–100.0)
MONOS PCT: 5 %
Monocytes Absolute: 0.6 10*3/uL (ref 0.2–0.9)
NEUTROS ABS: 10.4 10*3/uL — AB (ref 1.4–6.5)
NEUTROS PCT: 87 %
Platelets: 278 10*3/uL (ref 150–440)
RBC: 2.91 MIL/uL — AB (ref 3.80–5.20)
RDW: 18.2 % — ABNORMAL HIGH (ref 11.5–14.5)
WBC: 11.9 10*3/uL — ABNORMAL HIGH (ref 3.6–11.0)

## 2017-08-24 LAB — URINALYSIS, COMPLETE (UACMP) WITH MICROSCOPIC
BACTERIA UA: NONE SEEN
BILIRUBIN URINE: NEGATIVE
Glucose, UA: NEGATIVE mg/dL
Hgb urine dipstick: NEGATIVE
KETONES UR: NEGATIVE mg/dL
LEUKOCYTES UA: NEGATIVE
NITRITE: NEGATIVE
PH: 5 (ref 5.0–8.0)
PROTEIN: 30 mg/dL — AB
Specific Gravity, Urine: 1.005 (ref 1.005–1.030)

## 2017-08-24 LAB — GLUCOSE, CAPILLARY
GLUCOSE-CAPILLARY: 279 mg/dL — AB (ref 65–99)
Glucose-Capillary: 233 mg/dL — ABNORMAL HIGH (ref 65–99)
Glucose-Capillary: 173 mg/dL — ABNORMAL HIGH (ref 65–99)

## 2017-08-24 LAB — INFLUENZA PANEL BY PCR (TYPE A & B)
Influenza A By PCR: NEGATIVE
Influenza B By PCR: NEGATIVE

## 2017-08-24 LAB — TROPONIN I: Troponin I: 0.04 ng/mL (ref <0.03)

## 2017-08-24 LAB — BRAIN NATRIURETIC PEPTIDE: B NATRIURETIC PEPTIDE 5: 676 pg/mL — AB (ref 0.0–100.0)

## 2017-08-24 MED ORDER — ACETAMINOPHEN 325 MG PO TABS: 650.0000 mg | ORAL_TABLET | Freq: Four times a day (QID) | ORAL | Status: DC | PRN

## 2017-08-24 MED ORDER — CARVEDILOL 6.25 MG PO TABS
6.2500 mg | ORAL_TABLET | Freq: Two times a day (BID) | ORAL | Status: DC
  Administered 2017-08-24 – 2017-08-26 (×5): 6.25 mg via ORAL
  Filled 2017-08-24 (×5): qty 1

## 2017-08-24 MED ORDER — LEVOCETIRIZINE DIHYDROCHLORIDE 5 MG PO TABS: 10.0000 mg | ORAL_TABLET | Freq: Every day | ORAL | Status: DC

## 2017-08-24 MED ORDER — TORSEMIDE 20 MG PO TABS
20.0000 mg | ORAL_TABLET | Freq: Two times a day (BID) | ORAL | Status: DC
  Administered 2017-08-24 – 2017-08-26 (×4): 20 mg via ORAL
  Filled 2017-08-24 (×4): qty 1

## 2017-08-24 MED ORDER — ASPIRIN 81 MG PO TBEC
81.0000 mg | DELAYED_RELEASE_TABLET | Freq: Every day | ORAL | Status: DC
  Administered 2017-08-24 – 2017-08-26 (×3): 81 mg via ORAL
  Filled 2017-08-24 (×6): qty 1

## 2017-08-24 MED ORDER — ONDANSETRON HCL 4 MG/2ML IJ SOLN: 4.0000 mg | Freq: Four times a day (QID) | INTRAMUSCULAR | Status: DC | PRN

## 2017-08-24 MED ORDER — ALBUTEROL SULFATE (2.5 MG/3ML) 0.083% IN NEBU
5.0000 mg | INHALATION_SOLUTION | Freq: Once | RESPIRATORY_TRACT | Status: AC
  Administered 2017-08-24: 5 mg via RESPIRATORY_TRACT

## 2017-08-24 MED ORDER — METHYLPREDNISOLONE SODIUM SUCC 125 MG IJ SOLR
60.0000 mg | Freq: Three times a day (TID) | INTRAMUSCULAR | Status: DC
  Administered 2017-08-24 – 2017-08-25 (×4): 60 mg via INTRAVENOUS
  Filled 2017-08-24 (×4): qty 2

## 2017-08-24 MED ORDER — ONDANSETRON HCL 4 MG PO TABS: 4.0000 mg | ORAL_TABLET | Freq: Four times a day (QID) | ORAL | Status: DC | PRN

## 2017-08-24 MED ORDER — ALBUTEROL SULFATE (2.5 MG/3ML) 0.083% IN NEBU
INHALATION_SOLUTION | RESPIRATORY_TRACT | Status: AC
  Filled 2017-08-24: qty 6

## 2017-08-24 MED ORDER — POTASSIUM CHLORIDE CRYS ER 20 MEQ PO TBCR
20.0000 meq | EXTENDED_RELEASE_TABLET | Freq: Every day | ORAL | Status: DC
  Administered 2017-08-24 – 2017-08-26 (×3): 20 meq via ORAL
  Filled 2017-08-24 (×3): qty 1

## 2017-08-24 MED ORDER — IPRATROPIUM-ALBUTEROL 0.5-2.5 (3) MG/3ML IN SOLN
3.0000 mL | Freq: Four times a day (QID) | RESPIRATORY_TRACT | Status: DC
  Administered 2017-08-24 – 2017-08-26 (×9): 3 mL via RESPIRATORY_TRACT
  Filled 2017-08-24 (×9): qty 3

## 2017-08-24 MED ORDER — INSULIN ASPART 100 UNIT/ML ~~LOC~~ SOLN
0.0000 [IU] | Freq: Three times a day (TID) | SUBCUTANEOUS | Status: DC
  Administered 2017-08-24: 8 [IU] via SUBCUTANEOUS
  Administered 2017-08-25: 5 [IU] via SUBCUTANEOUS
  Administered 2017-08-25 – 2017-08-26 (×3): 3 [IU] via SUBCUTANEOUS
  Filled 2017-08-24 (×5): qty 1

## 2017-08-24 MED ORDER — BUDESONIDE 0.25 MG/2ML IN SUSP
0.2500 mg | Freq: Two times a day (BID) | RESPIRATORY_TRACT | Status: DC
  Administered 2017-08-24 – 2017-08-26 (×5): 0.25 mg via RESPIRATORY_TRACT
  Filled 2017-08-24 (×5): qty 2

## 2017-08-24 MED ORDER — ATORVASTATIN CALCIUM 20 MG PO TABS
40.0000 mg | ORAL_TABLET | Freq: Every day | ORAL | Status: DC
  Administered 2017-08-24 – 2017-08-26 (×3): 40 mg via ORAL
  Filled 2017-08-24 (×3): qty 2

## 2017-08-24 MED ORDER — ACETAMINOPHEN 650 MG RE SUPP: 650.0000 mg | Freq: Four times a day (QID) | RECTAL | Status: DC | PRN

## 2017-08-24 MED ORDER — CHLORHEXIDINE GLUCONATE 0.12 % MT SOLN
15.0000 mL | Freq: Two times a day (BID) | OROMUCOSAL | Status: DC
  Administered 2017-08-24 – 2017-08-25 (×3): 15 mL via OROMUCOSAL
  Filled 2017-08-24 (×4): qty 15

## 2017-08-24 MED ORDER — MONTELUKAST SODIUM 10 MG PO TABS
10.0000 mg | ORAL_TABLET | Freq: Every day | ORAL | Status: DC
  Administered 2017-08-24 – 2017-08-25 (×2): 10 mg via ORAL
  Filled 2017-08-24 (×2): qty 1

## 2017-08-24 MED ORDER — MIRTAZAPINE 15 MG PO TABS
45.0000 mg | ORAL_TABLET | Freq: Every day | ORAL | Status: DC
  Administered 2017-08-24 – 2017-08-25 (×2): 45 mg via ORAL
  Filled 2017-08-24 (×2): qty 3

## 2017-08-24 MED ORDER — ARFORMOTEROL TARTRATE 15 MCG/2ML IN NEBU
15.0000 ug | INHALATION_SOLUTION | Freq: Two times a day (BID) | RESPIRATORY_TRACT | Status: DC
  Administered 2017-08-24 – 2017-08-26 (×5): 15 ug via RESPIRATORY_TRACT
  Filled 2017-08-24 (×6): qty 2

## 2017-08-24 MED ORDER — IPRATROPIUM-ALBUTEROL 0.5-2.5 (3) MG/3ML IN SOLN
3.0000 mL | Freq: Once | RESPIRATORY_TRACT | Status: AC
  Administered 2017-08-24: 3 mL via RESPIRATORY_TRACT
  Filled 2017-08-24: qty 3

## 2017-08-24 MED ORDER — SODIUM CHLORIDE 0.9% FLUSH
3.0000 mL | Freq: Two times a day (BID) | INTRAVENOUS | Status: DC
  Administered 2017-08-24 – 2017-08-25 (×4): 3 mL via INTRAVENOUS

## 2017-08-24 MED ORDER — ORAL CARE MOUTH RINSE
15.0000 mL | Freq: Two times a day (BID) | OROMUCOSAL | Status: DC
  Administered 2017-08-25: 15 mL via OROMUCOSAL

## 2017-08-24 MED ORDER — ALPRAZOLAM 0.5 MG PO TABS
0.2500 mg | ORAL_TABLET | Freq: Three times a day (TID) | ORAL | Status: DC | PRN
  Administered 2017-08-24 – 2017-08-25 (×2): 0.25 mg via ORAL
  Filled 2017-08-24 (×2): qty 1

## 2017-08-24 MED ORDER — OCUVITE-LUTEIN PO CAPS
ORAL_CAPSULE | Freq: Two times a day (BID) | ORAL | Status: DC
  Administered 2017-08-24 – 2017-08-26 (×4): 1 via ORAL
  Filled 2017-08-24 (×6): qty 1

## 2017-08-24 MED ORDER — INSULIN ASPART 100 UNIT/ML ~~LOC~~ SOLN: 0.0000 [IU] | Freq: Every day | SUBCUTANEOUS | Status: DC

## 2017-08-24 MED ORDER — TIOTROPIUM BROMIDE MONOHYDRATE 18 MCG IN CAPS
18.0000 ug | ORAL_CAPSULE | Freq: Every day | RESPIRATORY_TRACT | Status: DC
  Administered 2017-08-24 – 2017-08-26 (×3): 18 ug via RESPIRATORY_TRACT
  Filled 2017-08-24: qty 5

## 2017-08-24 MED ORDER — SPIRONOLACTONE 12.5 MG HALF TABLET
12.5000 mg | ORAL_TABLET | Freq: Every day | ORAL | Status: DC
  Administered 2017-08-24 – 2017-08-26 (×3): 12.5 mg via ORAL
  Filled 2017-08-24 (×3): qty 1

## 2017-08-24 MED ORDER — HYDRALAZINE HCL 50 MG PO TABS
25.0000 mg | ORAL_TABLET | Freq: Three times a day (TID) | ORAL | Status: DC
  Administered 2017-08-24 – 2017-08-25 (×5): 25 mg via ORAL
  Filled 2017-08-24 (×5): qty 1

## 2017-08-24 MED ORDER — ENOXAPARIN SODIUM 30 MG/0.3ML ~~LOC~~ SOLN
30.0000 mg | SUBCUTANEOUS | Status: DC
  Administered 2017-08-24 – 2017-08-25 (×2): 30 mg via SUBCUTANEOUS
  Filled 2017-08-24 (×2): qty 0.3

## 2017-08-24 MED ORDER — METHYLPREDNISOLONE SODIUM SUCC 125 MG IJ SOLR
125.0000 mg | Freq: Once | INTRAMUSCULAR | Status: AC
  Administered 2017-08-24: 125 mg via INTRAVENOUS
  Filled 2017-08-24: qty 2

## 2017-08-24 MED ORDER — LORATADINE 10 MG PO TABS
10.0000 mg | ORAL_TABLET | Freq: Every day | ORAL | Status: DC
  Administered 2017-08-24 – 2017-08-26 (×3): 10 mg via ORAL
  Filled 2017-08-24 (×3): qty 1

## 2017-08-24 MED ORDER — FUROSEMIDE 10 MG/ML IJ SOLN
INTRAMUSCULAR | Status: AC
  Administered 2017-08-24: 40 mg via INTRAVENOUS
  Filled 2017-08-24: qty 10

## 2017-08-24 MED ORDER — ALBUTEROL SULFATE (2.5 MG/3ML) 0.083% IN NEBU: 2.5000 mg | INHALATION_SOLUTION | RESPIRATORY_TRACT | Status: DC | PRN

## 2017-08-24 MED ORDER — SENNOSIDES-DOCUSATE SODIUM 8.6-50 MG PO TABS
2.0000 | ORAL_TABLET | Freq: Two times a day (BID) | ORAL | Status: DC
  Administered 2017-08-24 – 2017-08-26 (×5): 2 via ORAL
  Filled 2017-08-24 (×5): qty 2

## 2017-08-24 MED ORDER — FUROSEMIDE 10 MG/ML IJ SOLN
40.0000 mg | Freq: Once | INTRAMUSCULAR | Status: AC
  Administered 2017-08-24: 40 mg via INTRAVENOUS

## 2017-08-24 NOTE — H&P (Signed)
[place] [facility]   PATIENT NAME: Angela Pham    MR#:  [id number]  DATE OF BIRTH:  10/28/1940  DATE OF ADMISSION:  08/24/2017  PRIMARY CARE PHYSICIAN: Cletis Athens, MD   REQUESTING/REFERRING PHYSICIAN: Arta Silence, MD  CHIEF COMPLAINT:   sob HISTORY OF PRESENT ILLNESS:  Angela Pham  is a 76 y.o. female with a known history of COPD, chronic diastolic CHF with ejection fraction 45-50%, coronary artery disease, diabetes mellitus was just discharged from the hospital with CHF exacerbation went home and was feeling short of breath and came into the hospital today.  Chest x-ray has revealed improving pneumonia and pulmonary   Edema.  Patient reports that she has oxygen at home and uses 2 L at nighttime but today she has been using oxygen continuously because she was feeling tight and wheezing and short of breath.  Patient was given IV Solu-Medrol and breathing treatments and hospitalist team was called to admit the patient  PAST MEDICAL HISTORY:   Past Medical History:  Diagnosis Date  . Asthma   . Chronic combined systolic and diastolic CHF (congestive heart failure) ([place])    a. TTE 02/2017: EF 45-50%, basal amd midanteroseptal HK b. 05/2017: echo showing EF of 35-40%, mild AI, moderate MR, and mildly dilated LA.   Marland Kitchen Coronary artery disease, non-occlusive    a. LHC 02/2017 normal coronary arteries  . Hyperlipidemia   . Hypertension   . Morbid obesity ([place])   . Pulmonary hypertension ([place])   . Type II diabetes mellitus ([place])     PAST SURGICAL HISTOIRY:   Past Surgical History:  Procedure Laterality Date  . c-section    . RIGHT/LEFT HEART CATH AND CORONARY ANGIOGRAPHY N/A 03/09/2017   Procedure: Right/Left Heart Cath and Coronary Angiography;  Surgeon: Minna Merritts, MD;  Location: [facility];  Service: Cardiovascular;  Laterality: N/A;    SOCIAL HISTORY:   Social History   Tobacco Use  . Smoking status: Former  Smoker    Packs/day: 0.30    Years: 20.00    Pack years: 6.00    Types: Cigarettes  . Smokeless tobacco: Never Used  Substance Use Topics  . Alcohol use: No    FAMILY HISTORY:   Family History  Problem Relation Age of Onset  . CAD Father   . Colon cancer Sister   . Leukemia Brother   . Throat cancer Brother   . Cervical cancer Sister     DRUG ALLERGIES:   Allergies  Allergen Reactions  . Ace Inhibitors Anaphylaxis    Tongue swelling  . Shellfish Allergy Hives and Swelling    Swelling around face and mouth     REVIEW OF SYSTEMS:  CONSTITUTIONAL: No fever, fatigue or weakness.  EYES: No blurred or double vision.  EARS, NOSE, AND THROAT: No tinnitus or ear pain.  RESPIRATORY: No cough, reporting shortness of breath, wheezing, denies hemoptysis.  CARDIOVASCULAR: No chest pain, orthopnea, edema.  GASTROINTESTINAL: No nausea, vomiting, diarrhea or abdominal pain.  GENITOURINARY: No dysuria, hematuria.  ENDOCRINE: No polyuria, nocturia,  HEMATOLOGY: No anemia, easy bruising or bleeding SKIN: No rash or lesion. MUSCULOSKELETAL: No joint pain or arthritis.   NEUROLOGIC: No tingling, numbness, weakness.  PSYCHIATRY: No anxiety or depression.   MEDICATIONS AT HOME:   Prior to Admission medications   Medication Sig Start Date End Date Taking? Authorizing Provider  ALPRAZolam Duanne Moron) 0.25 MG tablet Take 1 tablet by mouth 3 (three) times daily as  needed for anxiety or sleep.  06/10/17  Yes [provider]  arformoterol (BROVANA) 15 MCG/2ML NEBU Take 2 mLs (15 mcg total) 2 (two) times daily by nebulization. DX: COPD J44.9 08/08/17  Yes Wilhelmina Mcardle, MD  aspirin EC 81 MG EC tablet Take 1 tablet (81 mg total) by mouth daily. 03/12/17  Yes Mody, Ulice Bold, MD  atorvastatin (LIPITOR) 40 MG tablet Take 40 mg by mouth daily.   Yes [provider]  budesonide (PULMICORT) 0.25 MG/2ML nebulizer solution Take 2 mLs (0.25 mg total) 2 (two) times daily by nebulization. DX:  COPD J44.9 08/08/17  Yes Wilhelmina Mcardle, MD  carvedilol (COREG) 6.25 MG tablet Take 1 tablet (6.25 mg total) by mouth 2 (two) times daily. Patient taking differently: Take 6.25 mg by mouth daily.  06/23/17  Yes Strader, [place] M, PA-C  hydrALAZINE (APRESOLINE) 25 MG tablet Take 1 tablet (25 mg total) by mouth every 8 (eight) hours. 05/08/17  Yes Vaughan Basta, MD  Ipratropium-Albuterol (COMBIVENT IN) Inhale 3 puffs into the lungs every 4 (four) hours as needed.   Yes [provider]  ipratropium-albuterol (DUONEB) 0.5-2.5 (3) MG/3ML SOLN Inhale 3 mLs into the lungs every 4 (four) hours as needed.   Yes [provider]  levocetirizine (XYZAL) 5 MG tablet Take 10 mg by mouth daily. 03/18/17  Yes [provider]  metFORMIN (GLUCOPHAGE) 850 MG tablet Take 1 tablet by mouth 3 (three) times daily.    Yes [provider]  mirtazapine (REMERON) 45 MG tablet Take 1 tablet by mouth at bedtime.   Yes [provider]  montelukast (SINGULAIR) 10 MG tablet Take 1 tablet (10 mg total) by mouth at bedtime. 03/11/17  Yes Mody, Ulice Bold, MD  Multiple Vitamins-Minerals (EYE VITAMINS PO) Take 1 tablet by mouth 2 (two) times daily.    Yes [provider]  potassium chloride SA (K-DUR,KLOR-CON) 20 MEQ tablet Take 1 tablet (20 mEq total) by mouth daily. 08/23/17  Yes Fritzi Mandes, MD  senna-docusate (SENOKOT-S) 8.6-50 MG tablet Take 2 tablets by mouth 2 (two) times daily. 07/04/17  Yes Sudini, Alveta Heimlich, MD  spironolactone (ALDACTONE) 25 MG tablet Take 0.5 tablets (12.5 mg total) by mouth daily. 06/23/17  Yes Strader, [place], PA-C  tiotropium (SPIRIVA HANDIHALER) 18 MCG inhalation capsule Place 1 capsule (18 mcg total) into inhaler and inhale daily. 07/04/17 07/04/18 Yes Sudini, Alveta Heimlich, MD  torsemide (DEMADEX) 20 MG tablet Take one tablet twicw a day for 1 week and then change it to daily. May take one extra tablet daily for weight gain >3lbs overnight or >5lbs in a  week. Patient taking differently: 20 mg 2 (two) times daily.  08/23/17  Yes Fritzi Mandes, MD      VITAL SIGNS:  Blood pressure (!) 146/67, pulse (!) 106, resp. rate (!) 23, height 4\' 11"  (1.499 m), weight 80.3 kg (177 lb), SpO2 93 %.  PHYSICAL EXAMINATION:  GENERAL:  75 y.o.-year-old patient lying in the bed with no acute distress.  EYES: Pupils equal, round, reactive to light and accommodation. No scleral icterus. Extraocular muscles intact.  HEENT: Head atraumatic, normocephalic. Oropharynx and nasopharynx clear.  NECK:  Supple, no jugular venous distention. No thyroid enlargement, no tenderness.  LUNGS: Coarse bronchial breath sounds bilaterally, no wheezing, rales,rhonchi or crepitation. No use of accessory muscles of respiration.  CARDIOVASCULAR: S1, S2 normal. No murmurs, rubs, or gallops.  ABDOMEN: Soft, nontender, nondistended. Bowel sounds present. No organomegaly or mass.  EXTREMITIES: No pedal edema, cyanosis, or clubbing.  NEUROLOGIC: Cranial nerves II through XII are intact. Muscle strength 5/5 in all extremities. Sensation intact. Gait not checked.  PSYCHIATRIC: The patient is alert and oriented x 3.  SKIN: No obvious rash, lesion, or ulcer.   LABORATORY PANEL:   CBC Recent Labs  Lab 08/24/17 0815  WBC 11.9*  HGB 8.0*  HCT 25.4*  PLT 278   ------------------------------------------------------------------------------------------------------------------  Chemistries  Recent Labs  Lab 08/21/17 0033  08/24/17 0815  NA 141   < > 141  K 4.2   < > 4.5  CL 107   < > 107  CO2 26   < > 23  GLUCOSE 160*   < > 150*  BUN 46*   < > 39*  CREATININE 1.93*   < > 1.53*  CALCIUM 7.6*   < > 8.9  AST 19  --   --   ALT 20  --   --   ALKPHOS 42  --   --   BILITOT 0.3  --   --    < > = values in this interval not displayed.   ------------------------------------------------------------------------------------------------------------------  Cardiac Enzymes Recent Labs   Lab 08/24/17 0815  TROPONINI 0.04*   ------------------------------------------------------------------------------------------------------------------  RADIOLOGY:  Dg Chest Portable 1 View  Result Date: 08/24/2017 CLINICAL DATA:  Dyspnea EXAM: PORTABLE CHEST 1 VIEW COMPARISON:  08/20/2017 FINDINGS: Stable cardiomegaly. Improved hazy bibasilar airspace disease. Vascular congestion is stable. No pneumothorax. IMPRESSION: Stable vascular congestion. Improving bibasilar hazy airspace disease suggesting improved pneumonia or pulmonary edema. Electronically Signed   By: Marybelle Killings M.D.   On: 08/24/2017 09:41    EKG:   Orders placed or performed during the hospital encounter of 08/24/17  . EKG 12-Lead  . EKG 12-Lead  . ED EKG  . ED EKG    IMPRESSION AND PLAN:   76 year old female with past medical history of COPD CHF and obstructive sleep apnea and is presenting to the ED with a chief complaint of shortness of breath.  Patient was just discharged from the hospital yesterday with a CHF exacerbation and mild COPD exacerbation   #Acute respiratory distress secondary to  Acute COPD.--mild Admit patient to MedSurg unit Solu-Medrol, bronchodilator treatment COPD Gold protocol as patient is frequently getting admitted in the past several months Continue 2 L of oxygen and wean off as tolerated   # .  chronic systolic CHF Not fluid overloaded Chest x-rays revealing improving infiltrate and pulmonary edema. Continue Lasix, follow I's and O's and daily weights. -Continue bisoprolol, hydralazine,Aldactone.  #. Chronic kidney disease stage III-creatinine is close to baseline.Stable.  #Obstructive sleep apnea continue CPAP nightly  #. Diabetes type 2 without complication-hold metformin, Place on sliding scale insulin.Resumemetformin after discharge.  # Anxiety-on Xanax as needed.  # Hyperlipidemia-continue atorvastatin.  # Iron def anemia -pt intolerant to po iron.  IV  iron was given during the previous admission -she will f/u with Hematology as out pt--Dr Mike Gip (on call today)      All the records are reviewed and case discussed with ED provider. Management plans discussed with the patient, family and they are in agreement.  CODE STATUS: DO NOT RESUSCITATE, niece [name] is the healthcare power of attorney  TOTAL TIME TAKING CARE OF THIS PATIENT: 43  minutes.   Note: This dictation was prepared with Dragon dictation along with smaller phrase technology. Any transcriptional errors that result from this process are unintentional.  Nicholes Mango M.D on 08/24/2017 at 11:55 AM  Between 7am to 6pm -  Pager - [phone]  After 6pm go to [email or web address] - password [email or web address] [facility]  Office  [phone]  CC: Primary care physician; Cletis Athens, MD

## 2017-08-24 NOTE — Progress Notes (Signed)
Order for enoxaparin 40 mg subcutaneously daily was changed to 30 mg once daily for DVT ppx. Per protocol for CrCl < 30 mL/min.  Lenis Noon, PharmD 08/24/17 1:32 PM

## 2017-08-24 NOTE — ED Notes (Signed)
Pt currently up to the restroom at this time. Pt able to ambulate independently and is in NAD at this time.

## 2017-08-24 NOTE — Progress Notes (Signed)
Pt still have some dypsnea with exertion when she came in the floor. Now resting and states she feels a lot better now, but states still dyspneic when move around. Pt was oriented to the unit upon admission

## 2017-08-24 NOTE — Clinical Social Work Note (Signed)
CSW received consult that patient is under COPD Gold protocol, case manager has addressed.  CSW to sign off please reconsult if other social work needs arise.  Jones Broom. [place], MSW, [place]  08/24/2017 2:15 PM

## 2017-08-24 NOTE — ED Triage Notes (Signed)
Patient is here for dyspnea. Was just released from [facility] yesterday for same problem. HX CHF, HTN, COPD. Wears CPAP at night. Denies wearing O2 during day. A&O x4 upon arrival. C/o trouble breathing and wheezing

## 2017-08-24 NOTE — Progress Notes (Signed)
Patient refuses the bed alarm 

## 2017-08-24 NOTE — Care Management (Addendum)
Notified Angela Pham of readmission. Admitted under COPD Gold Protocol.  Patient has been followed by home in in the past under [facility] and declined the home health services because very intrusive in her home life.  Discharged in August 06 2017 and patient did not make herself available for the home visits, but relayed that "no one came to see her after she discharged." Patient has home nebulizer, oxygen 2-4 liters, cpap- which was just placed in the home 11.20.2018.  According to Epic- patient does not meet requirements for [facility] referral.  Have requested palliative care consult for freq admissions.  Will anticipated SN PT OT Aide SW and possibly palliative at discharge if able to return home. Have also reached out to ltac facilities to see patient would meet criteria and the likelihood of Medicare uhc approving that level of care. Spoke with patient regarding skilled nursing placement. Patient's has a skilled need of daily follow up round the clock to allow for a period of medical stability

## 2017-08-24 NOTE — Care Management Note (Signed)
Case Management Note  Patient Details  Name: Angela Pham MRN: [id number] Date of Birth: 1941-04-11  Subjective/Objective:       Spoke to pt. Who says she had spoken to [facility] staff, and arranged for them to see her today, but felt worse and so called EMS to come here. I have let a canary yellow card with [facility]'s office number on it on her chart so that if she discharges, she will have the number handy to re-schedule with them.   I am also contacting Corliss Blacker so he can follow up.           Action/Plan:   Expected Discharge Date:                  Expected Discharge Plan:     In-House Referral:     Discharge planning Services     Post Acute Care Choice:    Choice offered to:     DME Arranged:    DME Agency:     HH Arranged:    HH Agency:     Status of Service:     If discussed at [employer] of Stay Meetings, dates discussed:    Additional Comments:  Beau Fanny, RN 08/24/2017, 9:06 AM

## 2017-08-24 NOTE — Care Management (Signed)
Have notified [facility] of presentation to the ED.  She is suppose to wear oxygen continuous. Please communicate with [facility] to see if measures can be taken to prevent readmission. She has had seven admissions in past 6 months.  Notified ED CM

## 2017-08-24 NOTE — ED Provider Notes (Signed)
[facility] Emergency Department Provider Note ____________________________________________   First MD Initiated Contact with Patient 08/24/17 [phone]     (approximate)  I have reviewed the triage vital signs and the nursing notes.   HISTORY  Chief Complaint Shortness of Breath    HPI Angela Pham is a 76 y.o. female with a history of COPD, heart failure, hypertension, and other past medical history as noted below who presents with shortness of breath, gradual onset over the last day since she was discharged from the hospital, somewhat relieved when she used her CPAP machine at night, but otherwise severe and not helped by oxygen by nasal cannula which she also uses at night.  Patient states that she was just discharged from the hospital, and states that she had some lower extremity swelling during her admission which had resolved but was starting to get worse again yesterday.  She denies cough or fever, chest pain, abdominal pain, vomiting or diarrhea, or urinary symptoms.  Past Medical History:  Diagnosis Date  . Asthma   . Chronic combined systolic and diastolic CHF (congestive heart failure) ([place])    a. TTE 02/2017: EF 45-50%, basal amd midanteroseptal HK b. 05/2017: echo showing EF of 35-40%, mild AI, moderate MR, and mildly dilated LA.   Marland Kitchen Coronary artery disease, non-occlusive    a. LHC 02/2017 normal coronary arteries  . Hyperlipidemia   . Hypertension   . Morbid obesity ([place])   . Pulmonary hypertension ([place])   . Type II diabetes mellitus [facility])     Patient Active Problem List   Diagnosis Date Noted  . Acute URI 08/20/2017  . Acute respiratory failure with hypoxia ([place]) 08/03/2017  . DNR (do not resuscitate) 07/02/2017  . Respiratory failure ([place]) 06/27/2017  . Chronic diastolic heart failure ([place]) 06/24/2017  . Asthma 06/24/2017  . Diabetes ([place]) 06/24/2017  . Essential hypertension 06/23/2017  . Hyperlipidemia, unspecified 06/23/2017  . CKD  (chronic kidney disease) stage 3, GFR 30-59 ml/min (HCC) 06/23/2017  . Pressure injury of skin 06/12/2017  . COPD exacerbation ([place]) 05/28/2017  . SOB (shortness of breath)   . Chronic combined systolic and diastolic heart failure ([place]) 03/07/2017    Past Surgical History:  Procedure Laterality Date  . c-section    . RIGHT/LEFT HEART CATH AND CORONARY ANGIOGRAPHY N/A 03/09/2017   Procedure: Right/Left Heart Cath and Coronary Angiography;  Surgeon: Minna Merritts, MD;  Location: [facility];  Service: Cardiovascular;  Laterality: N/A;    Prior to Admission medications   Medication Sig Start Date End Date Taking? Authorizing Provider  ALPRAZolam Duanne Moron) 0.25 MG tablet Take 1 tablet by mouth 3 (three) times daily as needed for anxiety or sleep.  06/10/17  Yes [provider]  arformoterol (BROVANA) 15 MCG/2ML NEBU Take 2 mLs (15 mcg total) 2 (two) times daily by nebulization. DX: COPD J44.9 08/08/17  Yes Wilhelmina Mcardle, MD  aspirin EC 81 MG EC tablet Take 1 tablet (81 mg total) by mouth daily. 03/12/17  Yes Mody, Ulice Bold, MD  atorvastatin (LIPITOR) 40 MG tablet Take 40 mg by mouth daily.   Yes [provider]  budesonide (PULMICORT) 0.25 MG/2ML nebulizer solution Take 2 mLs (0.25 mg total) 2 (two) times daily by nebulization. DX: COPD J44.9 08/08/17  Yes Wilhelmina Mcardle, MD  carvedilol (COREG) 6.25 MG tablet Take 1 tablet (6.25 mg total) by mouth 2 (two) times daily. Patient taking differently: Take 6.25 mg by mouth daily.  06/23/17  Yes  Ahmed Prima, [place] M, PA-C  hydrALAZINE (APRESOLINE) 25 MG tablet Take 1 tablet (25 mg total) by mouth every 8 (eight) hours. 05/08/17  Yes Vaughan Basta, MD  Ipratropium-Albuterol (COMBIVENT IN) Inhale 3 puffs into the lungs every 4 (four) hours as needed.   Yes [provider]  ipratropium-albuterol (DUONEB) 0.5-2.5 (3) MG/3ML SOLN Inhale 3 mLs into the lungs every 4 (four) hours as needed.   Yes [provider]  levocetirizine (XYZAL) 5 MG tablet Take 10 mg by mouth daily. 03/18/17  Yes [provider]  metFORMIN (GLUCOPHAGE) 850 MG tablet Take 1 tablet by mouth 3 (three) times daily.    Yes [provider]  mirtazapine (REMERON) 45 MG tablet Take 1 tablet by mouth at bedtime.   Yes [provider]  montelukast (SINGULAIR) 10 MG tablet Take 1 tablet (10 mg total) by mouth at bedtime. 03/11/17  Yes Mody, Ulice Bold, MD  Multiple Vitamins-Minerals (EYE VITAMINS PO) Take 1 tablet by mouth 2 (two) times daily.    Yes [provider]  potassium chloride SA (K-DUR,KLOR-CON) 20 MEQ tablet Take 1 tablet (20 mEq total) by mouth daily. 08/23/17  Yes Fritzi Mandes, MD  senna-docusate (SENOKOT-S) 8.6-50 MG tablet Take 2 tablets by mouth 2 (two) times daily. 07/04/17  Yes Sudini, Alveta Heimlich, MD  spironolactone (ALDACTONE) 25 MG tablet Take 0.5 tablets (12.5 mg total) by mouth daily. 06/23/17  Yes Strader, [place], PA-C  tiotropium (SPIRIVA HANDIHALER) 18 MCG inhalation capsule Place 1 capsule (18 mcg total) into inhaler and inhale daily. 07/04/17 07/04/18 Yes Sudini, Alveta Heimlich, MD  torsemide (DEMADEX) 20 MG tablet Take one tablet twicw a day for 1 week and then change it to daily. May take one extra tablet daily for weight gain >3lbs overnight or >5lbs in a week. Patient taking differently: 20 mg 2 (two) times daily.  08/23/17  Yes Fritzi Mandes, MD    Allergies Ace inhibitors and Shellfish allergy  Family History  Problem Relation Age of Onset  . CAD Father   . Colon cancer Sister   . Leukemia Brother   . Throat cancer Brother   . Cervical cancer Sister     Social History Social History   Tobacco Use  . Smoking status: Former Smoker    Packs/day: 0.30    Years: 20.00    Pack years: 6.00    Types: Cigarettes  . Smokeless tobacco: Never Used  Substance Use Topics  . Alcohol use: No  . Drug use: No    Review of Systems  Constitutional: No fever/chills. Eyes: No  visual changes. ENT: No sore throat. Cardiovascular: Denies chest pain. Respiratory: Positive for shortness of breath. Gastrointestinal: No nausea, no vomiting.  No diarrhea.  Genitourinary: Negative for dysuria.  Musculoskeletal: Negative for back pain. Skin: Negative for rash. Neurological: Negative for headaches, focal weakness or numbness.   ____________________________________________   PHYSICAL EXAM:  VITAL SIGNS: ED Triage Vitals  Enc Vitals Group     BP      Pulse      Resp      Temp      Temp src      SpO2      Weight      Height      Head Circumference      Peak Flow      Pain Score      Pain Loc      Pain Edu?      Excl. in [place]?     Constitutional:  Alert and oriented.  Uncomfortable appearing. Eyes: Conjunctivae are normal.  Head: Atraumatic. Nose: No congestion/rhinnorhea. Mouth/Throat: Mucous membranes are moist.   Neck: Normal range of motion.  Cardiovascular: Tachycardic, regular rhythm. Grossly normal heart sounds.  Good peripheral circulation. Respiratory: Increased respiratory effort.  Mild respiratory distress.  Bilateral wheeze, no rales. Gastrointestinal: Soft and nontender. No distention.  Genitourinary: No CVA tenderness. Musculoskeletal: Trace bilateral lower extremity edema.  Extremities warm and well perfused.  Neurologic:  Normal speech and language. No gross focal neurologic deficits are appreciated.  Skin:  Skin is warm and dry. No rash noted. Psychiatric: Mood and affect are normal. Speech and behavior are normal.  ____________________________________________   LABS (all labs ordered are listed, but only abnormal results are displayed)  Labs Reviewed  BASIC METABOLIC PANEL - Abnormal; Notable for the following components:      Result Value   Glucose, Bld 150 (*)    BUN 39 (*)    Creatinine, Ser 1.53 (*)    GFR calc non Af Amer 32 (*)    GFR calc Af Amer 38 (*)    All other components within normal limits  CBC WITH  DIFFERENTIAL/PLATELET - Abnormal; Notable for the following components:   WBC 11.9 (*)    RBC 2.91 (*)    Hemoglobin 8.0 (*)    HCT 25.4 (*)    MCHC 31.7 (*)    RDW 18.2 (*)    Neutro Abs 10.4 (*)    Lymphs Abs 0.8 (*)    All other components within normal limits  BRAIN NATRIURETIC PEPTIDE - Abnormal; Notable for the following components:   B Natriuretic Peptide 676.0 (*)    All other components within normal limits  TROPONIN I - Abnormal; Notable for the following components:   Troponin I 0.04 (*)    All other components within normal limits  URINALYSIS, COMPLETE (UACMP) WITH MICROSCOPIC - Abnormal; Notable for the following components:   Color, Urine STRAW (*)    APPearance CLEAR (*)    Protein, ur 30 (*)    Squamous Epithelial / LPF 0-5 (*)    All other components within normal limits   ____________________________________________  EKG  ED ECG REPORT I, Arta Silence, the attending physician, personally viewed and interpreted this ECG.  Date: 08/24/2017 EKG Time: 757 Rate: 105 Rhythm: Sinus tachycardia QRS Axis: normal Intervals: Intraventricular conduction delay ST/T Wave abnormalities: normal Narrative Interpretation: no evidence of acute ischemia; no significant change when compared to EKG of 08/03/2017  ____________________________________________  RADIOLOGY  CXR: Stable vascular congestion  ____________________________________________   PROCEDURES  Procedure(s) performed: No    Critical Care performed: No ____________________________________________   INITIAL IMPRESSION / ASSESSMENT AND PLAN / ED COURSE  Pertinent labs & imaging results that were available during my care of the patient were reviewed by me and considered in my medical decision making (see chart for details).  76 year old female with past medical history as noted above presents with persistent worsening shortness of breath since her discharge from the hospital for CHF and COPD  exacerbation yesterday.  Patient states she had some relief when she used her CPAP at night, but otherwise has been significantly short of breath and wheezing despite her normal medicines.  She denies fever or chest pain.  Review of past medical records in Epic demonstrates the patient was admitted to the hospital and discharged yesterday.  Her primary diagnosis for this admission was CHF, although there was also thought to be component of COPD.  On  exam, vital signs are normal except for O2 sat which is low to mid 90s on 4 L nasal cannula.  She is somewhat uncomfortable appearing, and in mild respiratory distress, but able to speak in short sentences.  Lung exam demonstrates primarily wheezing.  Overall I suspect most likely COPD as the etiology, versus CHF, less likely pneumonia or acute bronchitis, or ACS or cardiac cause.  Plan for nebs, steroid, chest x-ray, lab workup, and reassess.  ----------------------------------------- 11:35 AM on 08/24/2017 -----------------------------------------  Patient workup reveals slightly improved x-ray from previously but slightly elevated BNP.  She does report improvement in her symptoms after nebs and steroid, but she is still slightly uncomfortable, and is still pursed lip breathing.  O2 sat is in the low 90s on 4 L by nasal cannula which is what patient is prescribed for at home.  She does desat with exertion.  Patient does not feel well to go home.  I will proceed with admission.  Signed out to hospitalist.     ____________________________________________   FINAL CLINICAL IMPRESSION(S) / ED DIAGNOSES  Final diagnoses:  COPD exacerbation ([place])  Hypoxia      NEW MEDICATIONS STARTED DURING THIS VISIT:  This SmartLink is deprecated. Use AVSMEDLIST instead to display the medication list for a patient.   Note:  This document was prepared using Dragon voice recognition software and may include unintentional dictation errors.    Arta Silence, MD 08/24/17 1137

## 2017-08-25 DIAGNOSIS — J441 Chronic obstructive pulmonary disease with (acute) exacerbation: Principal | ICD-10-CM

## 2017-08-25 DIAGNOSIS — R0902 Hypoxemia: Secondary | ICD-10-CM

## 2017-08-25 DIAGNOSIS — J9601 Acute respiratory failure with hypoxia: Secondary | ICD-10-CM

## 2017-08-25 DIAGNOSIS — Z7189 Other specified counseling: Secondary | ICD-10-CM

## 2017-08-25 LAB — GLUCOSE, CAPILLARY
GLUCOSE-CAPILLARY: 184 mg/dL — AB (ref 65–99)
GLUCOSE-CAPILLARY: 234 mg/dL — AB (ref 65–99)
GLUCOSE-CAPILLARY: 172 mg/dL — AB (ref 65–99)
Glucose-Capillary: 194 mg/dL — ABNORMAL HIGH (ref 65–99)

## 2017-08-25 LAB — COMPREHENSIVE METABOLIC PANEL
ALK PHOS: 68 U/L (ref 38–126)
ALT: 47 U/L (ref 14–54)
ANION GAP: 7 (ref 5–15)
AST: 25 U/L (ref 15–41)
Albumin: 3 g/dL — ABNORMAL LOW (ref 3.5–5.0)
BUN: 42 mg/dL — ABNORMAL HIGH (ref 6–20)
CALCIUM: 9.1 mg/dL (ref 8.9–10.3)
CHLORIDE: 109 mmol/L (ref 101–111)
CO2: 25 mmol/L (ref 22–32)
CREATININE: 1.56 mg/dL — AB (ref 0.44–1.00)
GFR, EST AFRICAN AMERICAN: 37 mL/min — AB (ref >60)
GFR, EST NON AFRICAN AMERICAN: 32 mL/min — AB (ref >60)
Glucose, Bld: 204 mg/dL — ABNORMAL HIGH (ref 65–99)
Potassium: 4.7 mmol/L (ref 3.5–5.1)
SODIUM: 141 mmol/L (ref 135–145)
Total Bilirubin: 0.5 mg/dL (ref 0.3–1.2)
Total Protein: 6.2 g/dL — ABNORMAL LOW (ref 6.5–8.1)

## 2017-08-25 LAB — CBC
HCT: 23.8 % — ABNORMAL LOW (ref 35.0–47.0)
HEMOGLOBIN: 7.5 g/dL — AB (ref 12.0–16.0)
MCH: 27.1 pg (ref 26.0–34.0)
MCHC: 31.4 g/dL — ABNORMAL LOW (ref 32.0–36.0)
MCV: 86.5 fL (ref 80.0–100.0)
PLATELETS: 273 10*3/uL (ref 150–440)
RBC: 2.75 MIL/uL — AB (ref 3.80–5.20)
RDW: 17.8 % — ABNORMAL HIGH (ref 11.5–14.5)
WBC: 13.6 10*3/uL — AB (ref 3.6–11.0)

## 2017-08-25 MED ORDER — MORPHINE SULFATE ER 30 MG PO TBCR
30.0000 mg | EXTENDED_RELEASE_TABLET | Freq: Two times a day (BID) | ORAL | Status: DC
  Administered 2017-08-25 – 2017-08-26 (×2): 30 mg via ORAL
  Filled 2017-08-25 (×2): qty 1

## 2017-08-25 MED ORDER — PREMIER PROTEIN SHAKE
11.0000 [oz_av] | Freq: Two times a day (BID) | ORAL | Status: DC
  Administered 2017-08-25: 11 [oz_av] via ORAL

## 2017-08-25 MED ORDER — ADULT MULTIVITAMIN W/MINERALS CH
1.0000 | ORAL_TABLET | Freq: Every day | ORAL | Status: DC
  Administered 2017-08-25 – 2017-08-26 (×2): 1 via ORAL
  Filled 2017-08-25 (×2): qty 1

## 2017-08-25 MED ORDER — METHYLPREDNISOLONE SODIUM SUCC 40 MG IJ SOLR
40.0000 mg | INTRAMUSCULAR | Status: DC
  Administered 2017-08-26: 40 mg via INTRAVENOUS
  Filled 2017-08-25: qty 1

## 2017-08-25 NOTE — NC FL2 (Signed)
[place] LEVEL OF CARE SCREENING TOOL     IDENTIFICATION  Patient Name: Angela Pham Birthdate: [birth year] Sex: female Admission Date (Current Location): 08/24/2017  Angela Pham and [place] Number:  Angela Pham [id number] [place] and Address:  [facility], [address] [place], [place] [address]      Provider Number: [id number]  Attending Physician Name and Address:  Demetrios Loll, MD  Relative Name and Phone Number:  Smith,Eleanor Niece [phone]  [phone]     Current Level of Care: Hospital Recommended Level of Care: [place] Prior Approval Number:    Date Approved/Denied:   PASRR Number: [id number]  Discharge Plan: SNF    Current Diagnoses: Patient Active Problem List   Diagnosis Date Noted  . Acute respiratory failure ([place]) 08/24/2017  . Acute URI 08/20/2017  . Acute respiratory failure with hypoxia ([place]) 08/03/2017  . DNR (do not resuscitate) 07/02/2017  . Respiratory failure ([place]) 06/27/2017  . Chronic diastolic heart failure ([place]) 06/24/2017  . Asthma 06/24/2017  . Diabetes ([place]) 06/24/2017  . Essential hypertension 06/23/2017  . Hyperlipidemia, unspecified 06/23/2017  . CKD (chronic kidney disease) stage 3, GFR 30-59 ml/min (HCC) 06/23/2017  . Pressure injury of skin 06/12/2017  . COPD exacerbation ([place]) 05/28/2017  . SOB (shortness of breath)   . Chronic combined systolic and diastolic heart failure (HCC) 03/07/2017    Orientation RESPIRATION BLADDER Height & Weight     Self, Time, Situation, Place  O2(2L) Continent Weight: 175 lb 4.8 oz (79.5 kg) Height:  4\' 11"  (149.9 cm)  BEHAVIORAL SYMPTOMS/MOOD NEUROLOGICAL BOWEL NUTRITION STATUS      Continent Diet(2L O2)  AMBULATORY STATUS COMMUNICATION OF NEEDS Skin   Limited Assist Verbally Normal                       Personal Care Assistance Level of Assistance  Bathing, Feeding, Dressing Bathing Assistance: Limited assistance Feeding  assistance: Independent Dressing Assistance: Limited assistance     Functional Limitations Info  Sight, Hearing, Speech Sight Info: Adequate Hearing Info: Adequate Speech Info: Adequate    SPECIAL CARE FACTORS FREQUENCY  PT (By licensed PT)     PT Frequency: 2x a week              Contractures Contractures Info: Not present    Additional Factors Info  Code Status, Allergies, Psychotropic Code Status Info: DNR Allergies Info: ACE INHIBITORS, SHELLFISH ALLERGY Psychotropic Info: mirtazapine (REMERON) tablet 45 mg          Current Medications (08/25/2017):  This is the current hospital active medication list Current Facility-Administered Medications  Medication Dose Route Frequency Provider Last Rate Last Dose  . acetaminophen (TYLENOL) tablet 650 mg  650 mg Oral Q6H PRN Gouru, Aruna, MD       Or  . acetaminophen (TYLENOL) suppository 650 mg  650 mg Rectal Q6H PRN Gouru, Aruna, MD      . albuterol (PROVENTIL) (2.5 MG/3ML) 0.083% nebulizer solution 2.5 mg  2.5 mg Nebulization Q4H PRN Gouru, Aruna, MD      . ALPRAZolam Duanne Moron) tablet 0.25 mg  0.25 mg Oral TID PRN Nicholes Mango, MD   0.25 mg at 08/24/17 2207  . arformoterol (BROVANA) nebulizer solution 15 mcg  15 mcg Nebulization BID Nicholes Mango, MD   15 mcg at 08/25/17 0804  . aspirin EC tablet 81 mg  81 mg Oral Daily Gouru, Aruna, MD   81 mg at 08/25/17 0844  . atorvastatin (LIPITOR) tablet 40  mg  40 mg Oral Daily Gouru, Aruna, MD   40 mg at 08/25/17 0844  . budesonide (PULMICORT) nebulizer solution 0.25 mg  0.25 mg Nebulization BID Gouru, Aruna, MD   0.25 mg at 08/25/17 0750  . carvedilol (COREG) tablet 6.25 mg  6.25 mg Oral BID Gouru, Aruna, MD   6.25 mg at 08/25/17 0844  . chlorhexidine (PERIDEX) 0.12 % solution 15 mL  15 mL Mouth Rinse BID Gouru, Aruna, MD   15 mL at 08/25/17 0843  . enoxaparin (LOVENOX) injection 30 mg  30 mg Subcutaneous Q24H Gouru, Aruna, MD   30 mg at 08/24/17 2209  . hydrALAZINE (APRESOLINE) tablet  25 mg  25 mg Oral Q8H Gouru, Aruna, MD   25 mg at 08/25/17 1359  . insulin aspart (novoLOG) injection 0-15 Units  0-15 Units Subcutaneous TID WC Gouru, Aruna, MD   5 Units at 08/25/17 1234  . insulin aspart (novoLOG) injection 0-5 Units  0-5 Units Subcutaneous QHS Gouru, Aruna, MD      . ipratropium-albuterol (DUONEB) 0.5-2.5 (3) MG/3ML nebulizer solution 3 mL  3 mL Inhalation Q6H Gouru, Aruna, MD   3 mL at 08/25/17 1332  . loratadine (CLARITIN) tablet 10 mg  10 mg Oral Daily Gouru, Aruna, MD   10 mg at 08/25/17 0844  . MEDLINE mouth rinse  15 mL Mouth Rinse q12n4p Gouru, Aruna, MD      . methylPREDNISolone sodium succinate (SOLU-MEDROL) 125 mg/2 mL injection 60 mg  60 mg Intravenous Q8H Gouru, Aruna, MD   60 mg at 08/25/17 1234  . mirtazapine (REMERON) tablet 45 mg  45 mg Oral QHS Gouru, Illene Silver, MD   45 mg at 08/24/17 2205  . montelukast (SINGULAIR) tablet 10 mg  10 mg Oral QHS Gouru, Illene Silver, MD   10 mg at 08/24/17 2205  . multivitamin with minerals tablet 1 tablet  1 tablet Oral Daily Demetrios Loll, MD      . multivitamin-lutein (OCUVITE-LUTEIN) capsule   Oral BID Nicholes Mango, MD   1 capsule at 08/25/17 0846  . ondansetron (ZOFRAN) tablet 4 mg  4 mg Oral Q6H PRN Gouru, Aruna, MD       Or  . ondansetron (ZOFRAN) injection 4 mg  4 mg Intravenous Q6H PRN Gouru, Aruna, MD      . potassium chloride SA (K-DUR,KLOR-CON) CR tablet 20 mEq  20 mEq Oral Daily Gouru, Aruna, MD   20 mEq at 08/25/17 0844  . protein supplement (PREMIER PROTEIN) liquid  11 oz Oral BID BM Demetrios Loll, MD      . senna-docusate (Senokot-S) tablet 2 tablet  2 tablet Oral BID Nicholes Mango, MD   2 tablet at 08/25/17 0843  . sodium chloride flush (NS) 0.9 % injection 3 mL  3 mL Intravenous Q12H Gouru, Aruna, MD   3 mL at 08/25/17 0847  . spironolactone (ALDACTONE) tablet 12.5 mg  12.5 mg Oral Daily Gouru, Aruna, MD   12.5 mg at 08/25/17 0845  . tiotropium (SPIRIVA) inhalation capsule 18 mcg  18 mcg Inhalation Daily Gouru, Aruna, MD   18  mcg at 08/25/17 0847  . torsemide (DEMADEX) tablet 20 mg  20 mg Oral BID Nicholes Mango, MD   20 mg at 08/25/17 5027     Discharge Medications: Please see discharge summary for a list of discharge medications.  Relevant Imaging Results:  Relevant Lab Results:   Additional Information SSN [id number]  Ross Ludwig, [place]

## 2017-08-25 NOTE — Progress Notes (Signed)
SATURATION QUALIFICATIONS: (This note is used to comply with regulatory documentation for home oxygen)  Patient Saturations on Room Air at Rest = 99%  Patient Saturations on Room Air while Ambulating = 91%  Patient Saturations on 1.5 Liters of oxygen while Ambulating = 96%  Please briefly explain why patient needs home oxygen:

## 2017-08-25 NOTE — Care Management Note (Signed)
Case Management Note  Patient Details  Name: Angela Pham MRN: [id number] Date of Birth: 06-Aug-1941  Subjective/Objective:                 Spoke with member services at [employer] regarding a Tourist information centre manager for patient once discharges.  Informed patient would have to give permission for this service and it can not be initiated until she discharges home.   Action/Plan:   Expected Discharge Date:                  Expected Discharge Plan:     In-House Referral:     Discharge planning Services     Post Acute Care Choice:    Choice offered to:     DME Arranged:    DME Agency:     HH Arranged:    HH Agency:     Status of Service:     If discussed at [employer] of Stay Meetings, dates discussed:    Additional Comments:  Katrina Stack, RN 08/25/2017, 4:36 PM

## 2017-08-25 NOTE — Progress Notes (Signed)
Inpatient Diabetes Program Recommendations  AACE/ADA: New Consensus Statement on Inpatient Glycemic Control (2015)  Target Ranges:  Prepandial:   less than 140 mg/dL      Peak postprandial:   less than 180 mg/dL (1-2 hours)      Critically ill patients:  140 - 180 mg/dL   Lab Results  Component Value Date   GLUCAP 184 (H) 08/25/2017   HGBA1C 5.8 (H) 03/09/2017    Review of Glycemic Control  Results for LAPARIS, DURRETT (MRN [id number]) as of 08/25/2017 10:23  Ref. Range 08/23/2017 11:11 08/24/2017 13:28 08/24/2017 16:34 08/24/2017 20:49 08/25/2017 07:51  Glucose-Capillary Latest Ref Range: 65 - 99 mg/dL 157 (H) 233 (H) 279 (H) 173 (H) 184 (H)    Diabetes history: Type 2 Outpatient Diabetes medications: Metformin 850mg  tid  Current orders for Inpatient glycemic control: Novolog 0-15 units tid, Novolog 0-5 units qhs * Solumedrol 60mg  q8h  Inpatient Diabetes Program Recommendations: Agree with current medications for blood sugar management.   Gentry Fitz, RN, BA, MHA, CDE Diabetes Coordinator Inpatient Diabetes Program  [phone] (Team Pager) [phone] ([place]) 08/25/2017 10:25 AM

## 2017-08-25 NOTE — Progress Notes (Signed)
Angela Pham at [place] NAME: Angela Pham    MR#:  [id number]  DATE OF BIRTH:  Mar 09, 1941  SUBJECTIVE:  CHIEF COMPLAINT:   Chief Complaint  Patient presents with  . Shortness of Breath   Still shortness of breath, wheezing and cough. She was on ventimask this am, then O2 [place] 2L. REVIEW OF SYSTEMS:  Review of Systems  Constitutional: Positive for malaise/fatigue. Negative for chills and fever.  HENT: Negative for sore throat.   Eyes: Negative for blurred vision and double vision.  Respiratory: Positive for cough, shortness of breath and wheezing. Negative for hemoptysis and stridor.   Cardiovascular: Negative for chest pain, palpitations, orthopnea and leg swelling.  Gastrointestinal: Negative for abdominal pain, blood in stool, diarrhea, melena, nausea and vomiting.  Genitourinary: Negative for dysuria, flank pain and hematuria.  Musculoskeletal: Negative for back pain and joint pain.  Neurological: Positive for weakness. Negative for dizziness, sensory change, focal weakness, seizures, loss of consciousness and headaches.  Endo/Heme/Allergies: Negative for polydipsia.  Psychiatric/Behavioral: Negative for depression. The patient is not nervous/anxious.     DRUG ALLERGIES:   Allergies  Allergen Reactions  . Ace Inhibitors Anaphylaxis    Tongue swelling  . Shellfish Allergy Hives and Swelling    Swelling around face and mouth    VITALS:  Blood pressure (!) 153/78, pulse (!) 108, temperature 98 F (36.7 C), resp. rate 16, height 4\' 11"  (1.499 m), weight 175 lb 4.8 oz (79.5 kg), SpO2 100 %. PHYSICAL EXAMINATION:  Physical Exam  Constitutional: She is oriented to person, place, and time and well-developed, well-nourished, and in no distress.  HENT:  Head: Normocephalic.  Mouth/Throat: Oropharynx is clear and moist.  Eyes: Conjunctivae and EOM are normal. Pupils are equal, round, and reactive to light. No scleral icterus.  Neck:  Normal range of motion. Neck supple. No JVD present. No tracheal deviation present.  Cardiovascular: Normal rate, regular rhythm and normal heart sounds. Exam reveals no gallop.  No murmur heard. Pulmonary/Chest: Effort normal. No respiratory distress. She has no wheezes. She has no rales.  Very diminished lung sounds  Abdominal: Soft. Bowel sounds are normal. She exhibits no distension. There is no tenderness. There is no rebound.  Musculoskeletal: Normal range of motion. She exhibits no edema or tenderness.  Neurological: She is alert and oriented to person, place, and time. No cranial nerve deficit.  Skin: No rash noted. No erythema.  Psychiatric: Affect normal.   LABORATORY PANEL:  Female CBC Recent Labs  Lab 08/25/17 0427  WBC 13.6*  HGB 7.5*  HCT 23.8*  PLT 273   ------------------------------------------------------------------------------------------------------------------ Chemistries  Recent Labs  Lab 08/25/17 0427  NA 141  K 4.7  CL 109  CO2 25  GLUCOSE 204*  BUN 42*  CREATININE 1.56*  CALCIUM 9.1  AST 25  ALT 47  ALKPHOS 68  BILITOT 0.5   RADIOLOGY:  No results found. ASSESSMENT AND PLAN:   76 year old female with past medical history of COPD CHF and obstructive sleep apnea and is presenting to the ED with a chief complaint of shortness of breath.  Patient was just discharged from the hospital yesterday with a CHF exacerbation and mild COPD exacerbation   #Acute respiratory distress secondary to COPD exacerbation. Solu-Medrol, bronchodilator treatment COPD Gold protocol as patient is frequently getting admitted in the past several months Continue 2 L of oxygen.  Pulmonary consult.  # .  chronic systolic CHF, ejection fraction 45-50% Not fluid overloaded Chest  x-rays revealing improving infiltrate and pulmonary edema. Continue Lasix, follow I's and O's and daily weights. -Continuebisoprolol, hydralazine,Aldactone.  #. Chronic kidney disease  stage III-creatinine is close to baseline.Stable.  #Obstructive sleep apnea continue CPAP nightly  #. Diabetes type 2 without complication-hold metformin, on sliding scale insulin.Resumemetformin after discharge.  # Anxiety-on Xanax as needed.  # Hyperlipidemia-continue atorvastatin.  # Iron def anemia -pt intolerant to po iron.  IV iron was given during the previous admission -she will f/u with Hematology as out pt--Dr Mike Gip.  She may need skilled nursing facility placement for case manager. Discussed with Dr. Felicie Morn and palliative care staff [facility].  All the records are reviewed and case discussed with Care Management/Social Worker. Management plans discussed with the patient, family and they are in agreement.  CODE STATUS: DNR  TOTAL TIME TAKING CARE OF THIS PATIENT: 43 minutes.   More than 50% of the time was spent in counseling/coordination of care: YES  POSSIBLE D/C IN 3 DAYS, DEPENDING ON CLINICAL CONDITION.   Demetrios Loll M.D on 08/25/2017 at 3:48 PM  Between 7am to 6pm - Pager - [phone]  After 6pm go to [email or web address] - [facility]

## 2017-08-25 NOTE — Evaluation (Signed)
Physical Therapy Evaluation Patient Details Name: Angela Pham MRN: [id number] DOB: September 05, 1941 Today's Date: 08/25/2017   History of Present Illness  Patient is a 76 y.o. female admitted on 11/28 with SOB. Pt was recently admitted 11/24-11/27. PMH includes chronic combined systolic/diastolic CHF, DMII, acute URI, COPD, CAD, and OSA.  Clinical Impression  Pt is a pleasant 76 year old female who was admitted for SOB. Recently admitted for similar symptoms. Pt demonstrates all bed mobility/transfers/ambulation at baseline level. No deficits at this time other than activity tolerance. OT addressing this need. Pt does not require any further PT needs at this time. Pt agreeable. All mobility performed on 2.5L of O2 with sats at 94-98% with exertion. Pt also performed 5 time sit<>Stand in 26 seconds with out SOB symptoms demonstrating good strength. Encouraged to stay active during hospital stay to prevent atrophy. Pt will be dc in house and does not require follow up. RN aware. Will dc current orders.      Follow Up Recommendations No PT follow up    Equipment Recommendations  None recommended by PT    Recommendations for Other Services       Precautions / Restrictions Precautions Precautions: Fall Restrictions Weight Bearing Restrictions: No      Mobility  Bed Mobility Overal bed mobility: Modified Independent             General bed mobility comments: not performed as received in recliner  Transfers Overall transfer level: Independent Equipment used: None             General transfer comment: safe technique without AD. on 2.5L of O2 with no SOB symptoms. 98% O2 sats.  Ambulation/Gait Ambulation/Gait assistance: Supervision Ambulation Distance (Feet): 40 Feet Assistive device: None Gait Pattern/deviations: WFL(Within Functional Limits)     General Gait Details: able to ambulate around room with safe technique and avoid obstacles safely. No LOB noted. All mobility  performed on 2.5 L of O2 with sats at 94% post exertion. Safe technique performed  Stairs            Wheelchair Mobility    Modified Rankin (Stroke Patients Only)       Balance Overall balance assessment: Modified Independent                                           Pertinent Vitals/Pain Pain Assessment: No/denies pain    Home Living Family/patient expects to be discharged to:: Private residence Living Arrangements: Other relatives Available Help at Discharge: Family;Available PRN/intermittently Type of Home: House Home Access: Stairs to enter Entrance Stairs-Rails: Can reach both Entrance Stairs-Number of Steps: 4 Home Layout: One level Home Equipment: Shower seat;Hand held shower head;Other (comment);Cane - single point Additional Comments: Patient was undergoing HHPT prior to recent admission.    Prior Function Level of Independence: Needs assistance   Gait / Transfers Assistance Needed: Performed independently short household distances due to fatigue/SOB, niece recently purchased a [facility] for pt for when/if she needs it  ADL's / Homemaking Assistance Needed: pt reports able to perform basic ADL with additional time/effort to complete; niece assists with cooking/cleaning; pt does not drive, reports having difficulty making appts due to no transportation  Comments: 1 fall in past 12 months due to tripping over rug     Hand Dominance   Dominant Hand: Left    Extremity/Trunk Assessment   Upper Extremity Assessment Upper  Extremity Assessment: Overall WFL for tasks assessed    Lower Extremity Assessment Lower Extremity Assessment: Overall WFL for tasks assessed    Cervical / Trunk Assessment Cervical / Trunk Assessment: Normal  Communication   Communication: No difficulties  Cognition Arousal/Alertness: Awake/alert Behavior During Therapy: WFL for tasks assessed/performed Overall Cognitive Status: Within Functional Limits for tasks  assessed                                 General Comments: pt expressed frustration with recent events      General Comments      Exercises Other Exercises Other Exercises: Pt educated in energy conservation strategies including activity pacing, home/routines modifications to minimize need to bend over (causing SOB, breathing restrictions), pursed lip breathing, and AE/DME to maximize safety. Handout provided. Pt verbalized understanding but would benefit from additional training to support carryover. Other Exercises: pt ambulated to bathroom with safe technique, able to perform self hygiene correctly. O2 tubing short, called RN to bring extension.    Assessment/Plan    PT Assessment [facility] does not need any further PT services  PT Problem List         PT Treatment Interventions      PT Goals (Current goals can be found in the Care Plan section)  Acute Rehab PT Goals Patient Stated Goal: to get better before going back home PT Goal Formulation: All assessment and education complete, DC therapy Time For Goal Achievement: 08/25/17 Potential to Achieve Goals: Good    Frequency     Barriers to discharge        Co-evaluation               AM-PAC PT "6 Clicks" Daily Activity  Outcome Measure Difficulty turning over in bed (including adjusting bedclothes, sheets and blankets)?: None Difficulty moving from lying on back to sitting on the side of the bed? : None Difficulty sitting down on and standing up from a chair with arms (e.g., wheelchair, bedside commode, etc,.)?: None Help needed moving to and from a bed to chair (including a wheelchair)?: None Help needed walking in hospital room?: None Help needed climbing 3-5 steps with a railing? : A Little 6 Click Score: 23    End of Session Equipment Utilized During Treatment: Oxygen Activity Tolerance: Patient tolerated treatment well Patient left: in chair Nurse Communication: Mobility status       Time: 6606-3016 PT Time Calculation (min) (ACUTE ONLY): 19 min   Charges:   PT Evaluation $PT Eval Low Complexity: 1 Low PT Treatments $Therapeutic Activity: 8-22 mins   PT G Codes:   PT G-Codes **NOT FOR INPATIENT CLASS** Functional Assessment Tool Used: AM-PAC 6 Clicks Basic Mobility;Clinical judgement Functional Limitation: Mobility: Walking and moving around Mobility: Walking and Moving Around Current Status (W1093): At least 1 percent but less than 20 percent impaired, limited or restricted Mobility: Walking and Moving Around Goal Status [phone]): At least 1 percent but less than 20 percent impaired, limited or restricted Mobility: Walking and Moving Around Discharge Status [phone]): At least 20 percent but less than 40 percent impaired, limited or restricted    Greggory Stallion, PT, DPT [phone]   Angela Pham 08/25/2017, 11:22 AM

## 2017-08-25 NOTE — Evaluation (Addendum)
Occupational Therapy Evaluation Patient Details Name: Angela Pham MRN: [id number] DOB: 17-Aug-1941 Today's Date: 08/25/2017    History of Present Illness Patient is a 76 y.o. female admitted on 11/28 with SOB. Pt was recently admitted 11/24-11/27. PMH includes chronic combined systolic/diastolic CHF, DMII, acute URI, COPD, CAD, and OSA.   Clinical Impression   Pt is 76 year old female who presents to [facility] hospital with SOB and history of COPD, CHF, and asthma. This is patient's 8th admission in 6 months and is COPD GOLD status. Prior to recent admissions, pt was able to perform mobility independently in the home (short distances), on 2L O2 only at night, and generally able to perform ADL tasks. Pt states that the hospital "had no business discharging me yesterday" and endorsed still wheezing when she left yesterday. Additionally, pt reports that when she was home prior to recent admission and this admission, was given the CPAP and when asked how to use her O2 with the CPAP was told by [facility] that there was not an MD order/prescription for O2. She was then mailed a connector piece for the CPAP but states that she was never given any instruction in how to use it. Pt very frustrated with care up to this point. Pt reports she was receiving [place] services prior to this and most recent admissions. RNCM notified of pt's frustrations.  Pt currently pt on 2.5L O2 and requires additional time/effort to perform ADLs due to SOB, demonstrating decreased endurance for functional tasks, decreased strength, impaired cardiopulmonary status, and at a greater risk for falls and increased caregiver burden. O2 sats 90-93% on 2.5L, HR 96-103. Pt would benefit from skilled OT services to increase independence in ADLs, education in energy conservation techniques, pursed lip breathing and recommendations for home modifications to increase safety and prevent falls. Recommend HHOT services to address energy conservation  strategies, bladder management, education/training in AE/DME, and home safety assessment in order to maximize functional independence and minimize risk of falls, COPD exacerbation, increased caregiver burden, and readmission.     Follow Up Recommendations  Home health OT    Equipment Recommendations  Other (comment)(reacher, sock aid, LH sponge, LH shoe horn)    Recommendations for Other Services       Precautions / Restrictions Precautions Precautions: Fall Restrictions Weight Bearing Restrictions: No      Mobility Bed Mobility Overal bed mobility: Modified Independent             General bed mobility comments: additional time/effort to perform  Transfers Overall transfer level: Modified independent Equipment used: None             General transfer comment: 2.5L O2, no assist required    Balance Overall balance assessment: Modified Independent                                         ADL either performed or assessed with clinical judgement   ADL Overall ADL's : Needs assistance/impaired                                       General ADL Comments: pt grossly able to perform without assist, however demonstrates increased SOB and fatigue. Pt educated in AE/DME for LB ADL tasks to minimize SOB and energy expenditure during basic ADL. Pt verbalized understanding. Would  benefit from additional training to maximize confidence and carryover of learned strategies.     Vision Patient Visual Report: No change from baseline       Perception     Praxis      Pertinent Vitals/Pain Pain Assessment: No/denies pain     Hand Dominance Left   Extremity/Trunk Assessment Upper Extremity Assessment Upper Extremity Assessment: Overall WFL for tasks assessed   Lower Extremity Assessment Lower Extremity Assessment: Overall WFL for tasks assessed   Cervical / Trunk Assessment Cervical / Trunk Assessment: Normal   Communication  Communication Communication: No difficulties   Cognition Arousal/Alertness: Awake/alert Behavior During Therapy: WFL for tasks assessed/performed Overall Cognitive Status: Within Functional Limits for tasks assessed                                 General Comments: pt expressed frustration with recent events   General Comments       Exercises Other Exercises Other Exercises: Pt educated in energy conservation strategies including activity pacing, home/routines modifications to minimize need to bend over (causing SOB, breathing restrictions), pursed lip breathing, and AE/DME to maximize safety. Handout provided. Pt verbalized understanding but would benefit from additional training to support carryover. Other Exercises: Pt reports difficulty with bladder mgt since starting on lasix. Educated in toileting schedule and use of incontinence products to minimize risk of leaks, need to rush to the bathroom (which can increase falls risk), and elevating feet during the day to minimize swelling. Pt verbalized understanding. Encouraged pt to implement toileting schedule to try to use the bathroom every 1 1/2 to 2 hours.    Shoulder Instructions      Home Living Family/patient expects to be discharged to:: Private residence Living Arrangements: Other relatives(niece lives with her) Available Help at Discharge: Family;Available PRN/intermittently(niece works during the day) Type of Home: House Home Access: Stairs to enter [employer] of Steps: 4 (previous chart review indicates 6) Entrance Stairs-Rails: None Home Layout: One level     Bathroom Shower/Tub: Teacher, early years/pre: Standard     Home Equipment: Shower seat;Hand held shower head;Other (comment);Cane - single point(tub rail)          Prior Functioning/Environment Level of Independence: Needs assistance  Gait / Transfers Assistance Needed: Performed independently short household distances  due to fatigue/SOB, niece recently purchased a SPC for pt for when/if she needs it ADL's / Homemaking Assistance Needed: pt reports able to perform basic ADL with additional time/effort to complete; niece assists with cooking/cleaning; pt does not drive, reports having difficulty making appts due to no transportation   Comments: 1 fall in past 12 months due to tripping over rug        OT Problem List: Decreased strength;Decreased knowledge of use of DME or AE;Cardiopulmonary status limiting activity;Decreased activity tolerance      OT Treatment/Interventions: Self-care/ADL training;Therapeutic exercise;Therapeutic activities;Energy conservation;DME and/or AE instruction;Patient/family education    OT Goals(Current goals can be found in the care plan section) Acute Rehab OT Goals Patient Stated Goal: to get better before going back home OT Goal Formulation: With patient Time For Goal Achievement: 09/08/17 Potential to Achieve Goals: Good  OT Frequency: Min 1X/week   Barriers to D/C:            Co-evaluation              AM-PAC PT "6 Clicks" Daily Activity     Outcome Measure  Help from another person eating meals?: None Help from another person taking care of personal grooming?: None Help from another person toileting, which includes using toliet, bedpan, or urinal?: None Help from another person bathing (including washing, rinsing, drying)?: A Little Help from another person to put on and taking off regular upper body clothing?: None Help from another person to put on and taking off regular lower body clothing?: A Little 6 Click Score: 22   End of Session    Activity Tolerance: Patient tolerated treatment well Patient left: in chair;with call bell/phone within reach;Other (comment)(PT in room to further assess mobility)  OT Visit Diagnosis: Other abnormalities of gait and mobility (R26.89);Muscle weakness (generalized) (M62.81)                Time: 9169-4503 OT Time  Calculation (min): 33 min Charges:  OT General Charges $OT Visit: 1 Visit OT Evaluation $OT Eval Low Complexity: 1 Low OT Treatments $Self Care/Home Management : 23-37 mins G-Codes: OT G-codes **NOT FOR INPATIENT CLASS** Functional Assessment Tool Used: AM-PAC 6 Clicks Daily Activity;Clinical judgement Functional Limitation: Self care Self Care Current Status (U8828): At least 20 percent but less than 40 percent impaired, limited or restricted Self Care Goal Status (M0349): At least 1 percent but less than 20 percent impaired, limited or restricted   Jeni Salles, MPH, MS, OTR/L ascom [phone] 08/25/17, 10:03 AM

## 2017-08-25 NOTE — Consult Note (Signed)
[facility] Consultation      Assessment and Plan:   Severe dyspnea with anxiety --The patient has severe dyspnea which I suspect is partly due to her underlying diseases as well as severe -I explained to the patient that her dyspnea may be at baseline, and may not improved that much.  She appears to be very upset about this and feels that she can no longer live like this.  I explained that she can tolerate this, and we can help her with medications to help reduce the sensation of dyspnea, as well as help her manage her dyspnea symptoms.  She appeared to understand this but will certainly require more reinforcement. -For the time being I would encourage management of her dyspnea symptoms with medications for anxiety and dizziness such as opioids and benzodiazepines in moderate doses.  She was should also be given frequent encouragement that her breathing is doing better than she thinks it is and that her symptoms are manageable.  I also explained to her that hospice is a option for her, however given her lack of oxygen requirement at this time it is very likely that she may not fit the 39-month timeline of hospice.  Chronic hypoxic respiratory failure -Oxygen supplementation as needed.  Emphysema. - We will wean down steroids as tolerated.  Continue nebulizers. -Discussed with her that she may benefit from pulmonary rehabilitation, she tells me that she does not have a ride to be able to go there.  Combined systolic and diastolic congestive heart failure.  - Continue current regimen.     Date: 08/25/2017  MRN# [id number] Angela Pham 1941/06/07  Referring Physician: Dr. Baldwin [place] Gonsalez is a 76 y.o. old female seen in consultation for chief complaint of:    Chief Complaint  Patient presents with  . 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  She has had numerous recurrent hospital admissions since June of this year, including 08/20/17, 11/7, 10/1, 9/16, 9/1, 8/8, 03/07/2017.  Most of these admissions have been due to CHF and/or COPD exacerbations.    We are called at this time because the patient has had numerous exacerbations" has had enough", and that she is "ready to die" feels that she cannot continue in this manner because she is too short of breath and cannot tolerate this degree of dyspnea.  She notes that she was discharged to the hospital, and at that time felt the same as when she came into the hospital.  She felt no better therefore she came back to the hospital.  Currently she feels a little bit better but still very short of breath and still does not feel back to normal. The patient was noted to be ranting for approximately 5 minutes, during this time I noted minimal conversational dyspnea.  I then challenged the patient that her dyspnea was certainly severe but tolerable, and we could learn how to help her better manage it.  We then ambulated the patient in the hallway, she had no oxygen requirement, and her dyspnea was moderate.  I encouraged her that her dyspnea was manageable as she was walking around and able to do this okay.  She appeared very nervous about this.  Images personally reviewed, chest x-ray 08/24/17; bibasilar atelectasis with mild pulmonary edema.  Cardiomegaly.  PMHX:   Past Medical  History:  Diagnosis Date  . Asthma   . Chronic combined systolic and diastolic CHF (congestive heart failure) ([place])    a. TTE 02/2017: EF 45-50%, basal amd midanteroseptal HK b. 05/2017: echo showing EF of 35-40%, mild AI, moderate MR, and mildly dilated LA.   Marland Kitchen Coronary artery disease, non-occlusive    a. LHC 02/2017 normal coronary arteries  . Hyperlipidemia   . Hypertension   . Morbid obesity ([place])   . Pulmonary hypertension ([place])   . Type II diabetes mellitus  ([place])    Surgical Hx:  Past Surgical History:  Procedure Laterality Date  . c-section    . RIGHT/LEFT HEART CATH AND CORONARY ANGIOGRAPHY N/A 03/09/2017   Procedure: Right/Left Heart Cath and Coronary Angiography;  Surgeon: Minna Merritts, MD;  Location: [facility];  Service: Cardiovascular;  Laterality: N/A;   Family Hx:  Family History  Problem Relation Age of Onset  . CAD Father   . Colon cancer Sister   . Leukemia Brother   . Throat cancer Brother   . Cervical cancer Sister    Social Hx:   Social History   Tobacco Use  . Smoking status: Former Smoker    Packs/day: 0.30    Years: 20.00    Pack years: 6.00    Types: Cigarettes  . Smokeless tobacco: Never Used  Substance Use Topics  . Alcohol use: No  . Drug use: No   Medication:    Current Facility-Administered Medications:  .  acetaminophen (TYLENOL) tablet 650 mg, 650 mg, Oral, Q6H PRN **OR** acetaminophen (TYLENOL) suppository 650 mg, 650 mg, Rectal, Q6H PRN, Gouru, Aruna, MD .  albuterol (PROVENTIL) (2.5 MG/3ML) 0.083% nebulizer solution 2.5 mg, 2.5 mg, Nebulization, Q4H PRN, Gouru, Aruna, MD .  ALPRAZolam Duanne Moron) tablet 0.25 mg, 0.25 mg, Oral, TID PRN, Gouru, Aruna, MD, 0.25 mg at 08/24/17 2207 .  arformoterol (BROVANA) nebulizer solution 15 mcg, 15 mcg, Nebulization, BID, Gouru, Aruna, MD, 15 mcg at 08/25/17 0804 .  aspirin EC tablet 81 mg, 81 mg, Oral, Daily, Gouru, Aruna, MD, 81 mg at 08/25/17 0844 .  atorvastatin (LIPITOR) tablet 40 mg, 40 mg, Oral, Daily, Gouru, Aruna, MD, 40 mg at 08/25/17 0844 .  budesonide (PULMICORT) nebulizer solution 0.25 mg, 0.25 mg, Nebulization, BID, Gouru, Aruna, MD, 0.25 mg at 08/25/17 0750 .  carvedilol (COREG) tablet 6.25 mg, 6.25 mg, Oral, BID, Gouru, Aruna, MD, 6.25 mg at 08/25/17 0844 .  chlorhexidine (PERIDEX) 0.12 % solution 15 mL, 15 mL, Mouth Rinse, BID, Gouru, Aruna, MD, 15 mL at 08/25/17 0843 .  enoxaparin (LOVENOX) injection 30 mg, 30 mg, Subcutaneous, Q24H,  Gouru, Aruna, MD, 30 mg at 08/24/17 2209 .  hydrALAZINE (APRESOLINE) tablet 25 mg, 25 mg, Oral, Q8H, Gouru, Aruna, MD, 25 mg at 08/25/17 1359 .  insulin aspart (novoLOG) injection 0-15 Units, 0-15 Units, Subcutaneous, TID WC, Gouru, Aruna, MD, 5 Units at 08/25/17 1234 .  insulin aspart (novoLOG) injection 0-5 Units, 0-5 Units, Subcutaneous, QHS, Gouru, Aruna, MD .  ipratropium-albuterol (DUONEB) 0.5-2.5 (3) MG/3ML nebulizer solution 3 mL, 3 mL, Inhalation, Q6H, Gouru, Aruna, MD, 3 mL at 08/25/17 1332 .  loratadine (CLARITIN) tablet 10 mg, 10 mg, Oral, Daily, Gouru, Aruna, MD, 10 mg at 08/25/17 0844 .  MEDLINE mouth rinse, 15 mL, Mouth Rinse, q12n4p, Gouru, Aruna, MD .  methylPREDNISolone sodium succinate (SOLU-MEDROL) 125 mg/2 mL injection 60 mg, 60 mg, Intravenous, Q8H, Gouru, Aruna, MD, 60 mg at 08/25/17 1234 .  mirtazapine (REMERON) tablet 45  mg, 45 mg, Oral, QHS, Gouru, Aruna, MD, 45 mg at 08/24/17 2205 .  montelukast (SINGULAIR) tablet 10 mg, 10 mg, Oral, QHS, Gouru, Aruna, MD, 10 mg at 08/24/17 2205 .  multivitamin with minerals tablet 1 tablet, 1 tablet, Oral, Daily, Demetrios Loll, MD .  multivitamin-lutein (OCUVITE-LUTEIN) capsule, , Oral, BID, Gouru, Aruna, MD, 1 capsule at 08/25/17 0846 .  ondansetron (ZOFRAN) tablet 4 mg, 4 mg, Oral, Q6H PRN **OR** ondansetron (ZOFRAN) injection 4 mg, 4 mg, Intravenous, Q6H PRN, Gouru, Aruna, MD .  potassium chloride SA (K-DUR,KLOR-CON) CR tablet 20 mEq, 20 mEq, Oral, Daily, Gouru, Aruna, MD, 20 mEq at 08/25/17 0844 .  protein supplement (PREMIER PROTEIN) liquid, 11 oz, Oral, BID BM, Demetrios Loll, MD .  senna-docusate (Senokot-S) tablet 2 tablet, 2 tablet, Oral, BID, Gouru, Aruna, MD, 2 tablet at 08/25/17 0843 .  sodium chloride flush (NS) 0.9 % injection 3 mL, 3 mL, Intravenous, Q12H, Gouru, Aruna, MD, 3 mL at 08/25/17 0847 .  spironolactone (ALDACTONE) tablet 12.5 mg, 12.5 mg, Oral, Daily, Gouru, Aruna, MD, 12.5 mg at 08/25/17 0845 .  tiotropium (SPIRIVA)  inhalation capsule 18 mcg, 18 mcg, Inhalation, Daily, Gouru, Aruna, MD, 18 mcg at 08/25/17 0847 .  torsemide (DEMADEX) tablet 20 mg, 20 mg, Oral, BID, Gouru, Aruna, MD, 20 mg at 08/25/17 9381   Allergies:  Ace inhibitors and Shellfish allergy  Review of Systems: Gen:  Denies  fever, sweats, chills HEENT: Denies blurred vision, double vision. bleeds, sore throat Cvc:  No dizziness, chest pain. Resp:   Denies cough or sputum production, shortness of breath Gi: Denies swallowing difficulty, stomach pain. Gu:  Denies bladder incontinence, burning urine Ext:   No Joint pain, stiffness. Skin: No skin rash,  hives  Endoc:  No polyuria, polydipsia. Psych: No depression, insomnia. Other:  All other systems were reviewed with the patient and were negative other that what is mentioned in the HPI.   Physical Examination:   VS: BP (!) 153/78   Pulse (!) 108   Temp 98 F (36.7 C)   Resp 16   Ht 4\' 11"  (1.499 m)   Wt 175 lb 4.8 oz (79.5 kg)   SpO2 100%   BMI 35.41 kg/m   General Appearance: No distress  Neuro:without focal findings,  speech normal,  HEENT: PERRLA, EOM intact.   Pulmonary: normal breath sounds, No wheezing.  CardiovascularNormal S1,S2.  No m/r/g.   Abdomen: Benign, Soft, non-tender. Renal:  No costovertebral tenderness  GU:  No performed at this time. Endoc: No evident thyromegaly, no signs of acromegaly. Skin:   warm, no rashes, no ecchymosis  Extremities: normal, no cyanosis, clubbing.  Other findings:    LABORATORY PANEL:   CBC Recent Labs  Lab 08/25/17 0427  WBC 13.6*  HGB 7.5*  HCT 23.8*  PLT 273   ------------------------------------------------------------------------------------------------------------------  Chemistries  Recent Labs  Lab 08/25/17 0427  NA 141  K 4.7  CL 109  CO2 25  GLUCOSE 204*  BUN 42*  CREATININE 1.56*  CALCIUM 9.1  AST 25  ALT 47  ALKPHOS 68  BILITOT 0.5    ------------------------------------------------------------------------------------------------------------------  Cardiac Enzymes Recent Labs  Lab 08/24/17 0815  TROPONINI 0.04*   ------------------------------------------------------------  RADIOLOGY:  Dg Chest Portable 1 View  Result Date: 08/24/2017 CLINICAL DATA:  Dyspnea EXAM: PORTABLE CHEST 1 VIEW COMPARISON:  08/20/2017 FINDINGS: Stable cardiomegaly. Improved hazy bibasilar airspace disease. Vascular congestion is stable. No pneumothorax. IMPRESSION: Stable vascular congestion. Improving bibasilar hazy airspace disease suggesting improved pneumonia  or pulmonary edema. Electronically Signed   By: Marybelle Killings M.D.   On: 08/24/2017 09:41       Thank  you for the consultation and for allowing [facility] to assist in the care of your patient. Our recommendations are noted above.  Please contact us if we can be of further service.   Marda Stalker, MD.  Board Certified in Internal Medicine, Pulmonary Medicine, [place], and Sleep Medicine.  [facility] Office Number: [phone]  Patricia Pesa, M.D.  Merton Border, M.D  08/25/2017

## 2017-08-25 NOTE — Consult Note (Addendum)
Consultation Note Date: 08/25/2017   Patient Name: Angela Pham  DOB: 04-06-1941  MRN: [id number]  Age / Sex: 76 y.o., female  PCP: Cletis Athens, MD Referring Physician: Demetrios Loll, MD  Reason for Consultation: Establishing goals of care  HPI/Patient Profile: Angela Pham is a 76 year old woman with history of chronic combined systolic and diastolic heart failure secondary to nonischemic cardiomyopathy, pulmonary hypertension, diabetes, hypertension, hyperlipidemia, chronic kidney disease, and COPD.  Clinical Assessment and Goals of Care: Angela Pham moved from [place] to this area to live with her niece and her husband in 2017. 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  She states she has been completing all ADL's but at a slow pace with frequent breaks. She states she is able to cook some as well. She states she cannot do it anymore. She states she wants to go to live in a facility where she can be taken care of. She states she is in the hospital more than she is at home with 7 admissions since June, and 3 of those this month. She states she is supposed to follow up with pulmonology, but is unable to make appointments due to transportation and being hospitalized. She is frustrated as she cannot afford her pulmonary medications.   A detailed discussion was had today regarding advanced directives.  Concepts specific to code status, artifical feeding and hydration,  IV antibiotics and rehospitalization was discussed.  The difference between a aggressive medical intervention path  and a hospice comfort care path was discussed. Natural trajectory and expectations at EOL were discussed.    She states at home she wakes up at night and is unable to breathe, she uses her rescue medications but it does not help and she calls  911 to be placed on the bipap. She states she was intubated in the 90's due to her pulmonary status, but states she never wants that again, she states she was young then, and now that she is older she would not be able to come off the vent.    Upon further conversation, she states her pulmonologist asked her to give him a year to improve her breathing. She states if her breathing can be improved, she would like to continue aggressive care including hospitalizations. She states that she cannot continue the ways she is now, and would be interested in home Hospice care if this is what her new norm will be.  I inquired about inviting her niece for a meeting and she stated that it wasn't needed, that her niece knew how she felt about things, and that she has a living will at home.    Pulmonology in to see patient, will return tomorrow for further discussion.     Patient is Media planner. She states niece would be decision maker if she were to become unable.     SUMMARY OF RECOMMENDATIONS   Will follow up tomorrow for  further conversation.  Outpatient palliative vs hospice.    Xanax TID PRN for anxiety. Would consider changing to Lexapro 5mg  daily.     Code Status/Advance Care Planning:  DNR    Symptom Management:   She denies pain.   Pulmonology consulted for assisted with resp status.   Additional Recommendations (Limitations, Scope, Preferences):  No intubation  Would not want: feeding tube or dialysis if they were ever indicated.    Prognosis:   < 6 months Given her respiratory status, CHF, CKD,  and frequent hospitalizations.   Discharge Planning: To Be Determined      Primary Diagnoses: Present on Admission: . Acute respiratory failure ([place])   I have reviewed the medical record, interviewed the patient and family, and examined the patient. The following aspects are pertinent.  Past Medical History:  Diagnosis Date  . Asthma   . Chronic combined systolic and  diastolic CHF (congestive heart failure) ([place])    a. TTE 02/2017: EF 45-50%, basal amd midanteroseptal HK b. 05/2017: echo showing EF of 35-40%, mild AI, moderate MR, and mildly dilated LA.   Marland Kitchen Coronary artery disease, non-occlusive    a. LHC 02/2017 normal coronary arteries  . Hyperlipidemia   . Hypertension   . Morbid obesity ([place])   . Pulmonary hypertension ([place])   . Type II diabetes mellitus ([place])    Social History   Socioeconomic History  . Marital status: Widowed    Spouse name: None  . Number of children: None  . Years of education: None  . Highest education level: None  Social Needs  . Financial resource strain: None  . Food insecurity - worry: None  . Food insecurity - inability: None  . Transportation needs - medical: None  . Transportation needs - non-medical: None  Occupational History  . None  Tobacco Use  . Smoking status: Former Smoker    Packs/day: 0.30    Years: 20.00    Pack years: 6.00    Types: Cigarettes  . Smokeless tobacco: Never Used  Substance and Sexual Activity  . Alcohol use: No  . Drug use: No  . Sexual activity: No  Other Topics Concern  . None  Social History Narrative  . None   Family History  Problem Relation Age of Onset  . CAD Father   . Colon cancer Sister   . Leukemia Brother   . Throat cancer Brother   . Cervical cancer Sister    Scheduled Meds: . arformoterol  15 mcg Nebulization BID  . aspirin  81 mg Oral Daily  . atorvastatin  40 mg Oral Daily  . budesonide  0.25 mg Nebulization BID  . carvedilol  6.25 mg Oral BID  . chlorhexidine  15 mL Mouth Rinse BID  . enoxaparin (LOVENOX) injection  30 mg Subcutaneous Q24H  . hydrALAZINE  25 mg Oral Q8H  . insulin aspart  0-15 Units Subcutaneous TID WC  . insulin aspart  0-5 Units Subcutaneous QHS  . ipratropium-albuterol  3 mL Inhalation Q6H  . loratadine  10 mg Oral Daily  . mouth rinse  15 mL Mouth Rinse q12n4p  . methylPREDNISolone (SOLU-MEDROL) injection  60 mg  Intravenous Q8H  . mirtazapine  45 mg Oral QHS  . montelukast  10 mg Oral QHS  . multivitamin-lutein   Oral BID  . potassium chloride SA  20 mEq Oral Daily  . senna-docusate  2 tablet Oral BID  . sodium chloride flush  3 mL Intravenous Q12H  . spironolactone  12.5 mg Oral Daily  . tiotropium  18 mcg Inhalation Daily  . torsemide  20 mg Oral BID   Continuous Infusions: PRN Meds:.acetaminophen **OR** acetaminophen, albuterol, ALPRAZolam, ondansetron **OR** ondansetron (ZOFRAN) IV Medications Prior to Admission:  Prior to Admission medications   Medication Sig Start Date End Date Taking? Authorizing Provider  ALPRAZolam Duanne Moron) 0.25 MG tablet Take 1 tablet by mouth 3 (three) times daily as needed for anxiety or sleep.  06/10/17  Yes [provider]  arformoterol (BROVANA) 15 MCG/2ML NEBU Take 2 mLs (15 mcg total) 2 (two) times daily by nebulization. DX: COPD J44.9 08/08/17  Yes Wilhelmina Mcardle, MD  aspirin EC 81 MG EC tablet Take 1 tablet (81 mg total) by mouth daily. 03/12/17  Yes Mody, Ulice Bold, MD  atorvastatin (LIPITOR) 40 MG tablet Take 40 mg by mouth daily.   Yes [provider]  budesonide (PULMICORT) 0.25 MG/2ML nebulizer solution Take 2 mLs (0.25 mg total) 2 (two) times daily by nebulization. DX: COPD J44.9 08/08/17  Yes Wilhelmina Mcardle, MD  carvedilol (COREG) 6.25 MG tablet Take 1 tablet (6.25 mg total) by mouth 2 (two) times daily. Patient taking differently: Take 6.25 mg by mouth daily.  06/23/17  Yes Strader, [place] M, PA-C  hydrALAZINE (APRESOLINE) 25 MG tablet Take 1 tablet (25 mg total) by mouth every 8 (eight) hours. 05/08/17  Yes Vaughan Basta, MD  Ipratropium-Albuterol (COMBIVENT IN) Inhale 3 puffs into the lungs every 4 (four) hours as needed.   Yes [provider]  ipratropium-albuterol (DUONEB) 0.5-2.5 (3) MG/3ML SOLN Inhale 3 mLs into the lungs every 4 (four) hours as needed.   Yes [provider]  levocetirizine (XYZAL) 5 MG  tablet Take 10 mg by mouth daily. 03/18/17  Yes [provider]  metFORMIN (GLUCOPHAGE) 850 MG tablet Take 1 tablet by mouth 3 (three) times daily.    Yes [provider]  mirtazapine (REMERON) 45 MG tablet Take 1 tablet by mouth at bedtime.   Yes [provider]  montelukast (SINGULAIR) 10 MG tablet Take 1 tablet (10 mg total) by mouth at bedtime. 03/11/17  Yes Mody, Ulice Bold, MD  Multiple Vitamins-Minerals (EYE VITAMINS PO) Take 1 tablet by mouth 2 (two) times daily.    Yes [provider]  potassium chloride SA (K-DUR,KLOR-CON) 20 MEQ tablet Take 1 tablet (20 mEq total) by mouth daily. 08/23/17  Yes Fritzi Mandes, MD  senna-docusate (SENOKOT-S) 8.6-50 MG tablet Take 2 tablets by mouth 2 (two) times daily. 07/04/17  Yes Sudini, Alveta Heimlich, MD  spironolactone (ALDACTONE) 25 MG tablet Take 0.5 tablets (12.5 mg total) by mouth daily. 06/23/17  Yes Strader, [place], PA-C  tiotropium (SPIRIVA HANDIHALER) 18 MCG inhalation capsule Place 1 capsule (18 mcg total) into inhaler and inhale daily. 07/04/17 07/04/18 Yes Sudini, Alveta Heimlich, MD  torsemide (DEMADEX) 20 MG tablet Take one tablet twicw a day for 1 week and then change it to daily. May take one extra tablet daily for weight gain >3lbs overnight or >5lbs in a week. Patient taking differently: 20 mg 2 (two) times daily.  08/23/17  Yes Fritzi Mandes, MD   Allergies  Allergen Reactions  . Ace Inhibitors Anaphylaxis    Tongue swelling  . Shellfish Allergy Hives and Swelling    Swelling around face and mouth    Review of Systems  Respiratory: Positive for shortness of breath and wheezing.     Physical Exam  Constitutional:  Sitting on side of bed, pursed lip breathing. RT in to  bedside to administer neb treatment.   Neurological: She is alert.  Oriented.    Vital Signs: BP 138/72 (BP Location: Left Arm)   Pulse (!) 101   Temp 98 F (36.7 C)   Resp 16   Ht 4\' 11"  (1.499 m)   Wt 79.5 kg (175 lb 4.8 oz)   SpO2 100%    BMI 35.41 kg/m  Pain Assessment: No/denies pain   Pain Score: 0-No pain   SpO2: SpO2: 100 % O2 Device:SpO2: 100 % O2 Flow Rate: .O2 Flow Rate (L/min): 2 L/min  IO: Intake/output summary:   Intake/Output Summary (Last 24 hours) at 08/25/2017 1406 Last data filed at 08/25/2017 1335 Gross per 24 hour  Intake 840 ml  Output 1300 ml  Net -460 ml    LBM: Last BM Date: 08/24/17 Baseline Weight: Weight: 80.3 kg (177 lb) Most recent weight: Weight: 79.5 kg (175 lb 4.8 oz)     Palliative Assessment/Data: 60%     Time In: 1:40 Time Out: 2:30 Time Total: 50 min Greater than 50%  of this time was spent counseling and coordinating care related to the above assessment and plan.  Signed by: Asencion Gowda, NP 08/25/2017 2:35 PM Office: [phone]) [phone] 7am-7pm  Please see Amion for pager number and availability  Call primary team after hours   Please contact Palliative Medicine Team phone at [phone] for questions and concerns.  For individual provider: See Shea Evans

## 2017-08-25 NOTE — Progress Notes (Signed)
Initial Nutrition Assessment  DOCUMENTATION CODES:   Obesity unspecified  INTERVENTION:   Recommend iron supplementation   Premier Protein BID, each supplement provides 160 kcal and 30 grams of protein.   MVI  Liberalize diet  NUTRITION DIAGNOSIS:   Increased nutrient needs related to catabolic illness(COPD) as evidenced by increased estimated needs from protein.  GOAL:   Patient will meet greater than or equal to 90% of their needs  MONITOR:   PO intake, Supplement acceptance, Labs, Weight trends  REASON FOR ASSESSMENT:   Consult Assessment of nutrition requirement/status  ASSESSMENT:   76 y.o. female with a known history of COPD, chronic diastolic CHF with ejection fraction 45-50%, coronary artery disease, diabetes mellitus was just discharged from the hospital with CHF exacerbation went home and was feeling short of breath and came back to the hospital. Chest x-ray has revealed improving pneumonia and pulmonary edema.   Unable to see pt today x 2 visits. Pt is currently eating 100% of meals. Per chart, pt has lost 17lbs(9%) in 2 months; this is severe. RD unsure as to why pt has lost weight but suspects pt is not meeting her high protein needs r/t COPD. RD will order supplements and liberalize diet. Will obtain nutrition related history and physical exam at follow up. Pt with low iron; per MD note pt does not tolerate oral iron; pt will f/u with Hematology as out pt.  Medications reviewed and include: aspirin, lovenox, insulin, solu-medrol, mirtazapine, MVI, Ocuvite, KCl, senokot, aldactone, torsemide   Labs reviewed: BUN 42(H), creat 1.56(H), alb 3.0(L) BNP- 676(H)- 11/28 Iron- 12(L) Wbc- 13.6(H), Hgb 7.5(L), Hct 23.8(L) cbgs- 125, 150, 204 x 48 hrs AIC 5.8(H)- 02/2017  Unable to complete Nutrition-Focused physical exam at this time.   Diet Order:  Diet Heart Room service appropriate? Yes; Fluid consistency: Thin  EDUCATION NEEDS:   Not appropriate for  education at this time  Skin:  Reviewed RN Assessment  Last BM:  11/28  Height:   Ht Readings from Last 1 Encounters:  08/24/17 4\' 11"  (1.499 m)    Weight:   Wt Readings from Last 1 Encounters:  08/25/17 175 lb 4.8 oz (79.5 kg)    Ideal Body Weight:  44.5 kg  BMI:  Body mass index is 35.41 kg/m.  Estimated Nutritional Needs:   Kcal:  1400-1600kcal/day   Protein:  80-95g/day   Fluid:  >1.4L/day   Koleen Distance MS, RD, LDN Pager #[phone] After Hours Pager: [phone]

## 2017-08-25 NOTE — Clinical Social Work Note (Signed)
Clinical Social Work Assessment  Patient Details  Name: Angela Pham MRN: [id number] Date of Birth: 1940-12-25  Date of referral:  08/25/17               Reason for consult:  Facility Placement                Permission sought to share information with:  Family Supports, Customer service manager Permission granted to share information::  Yes, Verbal Permission Granted  Name::     Smith,Eleanor Niece [phone]   Agency::  SNF admissions  Relationship::     Contact Information:     Housing/Transportation Living arrangements for the past 2 months:  Single Family Home Source of Information:  Patient Patient Interpreter Needed:  None Criminal Activity/Legal Involvement Pertinent to Current Situation/Hospitalization:  No - Comment as needed Significant Relationships:  Other Family Members Lives with:  Relatives Do you feel safe going back to the place where you live?  No Need for family participation in patient care:  No (Coment)  Care giving concerns:  Patient feels she needs to go to SNF for some rehab, and then decide if she needs SNF or ALF.   Social Worker assessment / plan:  Patient is a 76 year old female who is alert and oriented x4.  Patient states she has not been to rehab before, CSW explained to patient what to expect and process for looking for SNF.  CSW was informed how insurance will pay for stay at SNF, and role of CSW.  Patient states she feels like she needs to get a higher level of care, since she has been hospitalized 7 times in the past year.  Patient is considering long term care at SNF or maybe ALF.  Patient has [place] Medicare and Medicaid for insurance.  Patient states that she would like to receive some therapy and then progress to a lower level of care like ALF.  CSW provided patient with list of SNFs and ALFs to review.  Patient gave CSW permission to begin bed search in [facility] for SNF.  Employment status:  Retired Forensic scientist:  Information systems manager,  Medicaid In [place] PT Recommendations:  No Follow Up Information / Referral to community resources:  [place]  Patient/Family's Response to care:  Patient is in agreement to going to SNF for short term rehab before she goes to ALF.  Patient/Family's Understanding of and Emotional Response to Diagnosis, Current Treatment, and Prognosis:  Patient is aware of current prognosis, and treatment plan, she is motivated to get some therapy then maybe consider moving to ALF.  Emotional Assessment Appearance:  Appears younger than stated age Attitude/Demeanor/Rapport:    Affect (typically observed):  Appropriate, Calm, Stable, Pleasant Orientation:  Oriented to Self, Oriented to Place, Oriented to  Time, Oriented to Situation Alcohol / Substance use:  Not Applicable Psych involvement (Current and /or in the community):  No (Comment)  Discharge Needs  Concerns to be addressed:  Lack of Support Readmission within the last 30 days:  Yes(08/23/17 to home with niece) Current discharge risk:  Lack of support system Barriers to Discharge:  Continued Medical Work up   Ross Ludwig, [place] 08/25/2017, 5:00 PM

## 2017-08-25 NOTE — Plan of Care (Signed)
  Progressing Education: Knowledge of General Education information will improve 08/25/2017 0302 - Progressing by Loran Senters, RN Activity: Ability to implement measures to reduce episodes of fatigue will improve 08/25/2017 0302 - Progressing by Loran Senters, RN Ability to tolerate increased activity will improve 08/25/2017 0302 - Progressing by Loran Senters, RN Education: Knowledge of disease or condition will improve 08/25/2017 0302 - Progressing by Loran Senters, RN Knowledge of the prescribed therapeutic regimen will improve 08/25/2017 0302 - Progressing by Loran Senters, RN Respiratory: Ability to maintain a clear airway will improve 08/25/2017 0302 - Progressing by Loran Senters, RN Levels of oxygenation will improve 08/25/2017 0302 - Progressing by Loran Senters, RN Ability to maintain adequate ventilation will improve 08/25/2017 0302 - Progressing by Loran Senters, RN

## 2017-08-25 NOTE — Progress Notes (Signed)
Pt was refusing bed alarm and educated about safety.

## 2017-08-25 NOTE — Care Management (Addendum)
During progression discussed whether patient would benefit or if there is a need for prn medication for anxiety at home to be used when patient gets anxious due to respiratory issues.  Informed she does have xanax ordered at hs.  Palliative care consult pending to address sx management, frequent admissions.  Spoke with patient's niece [name]. She says  when patient discharged 48 hours prior- she was not wearing her oxygen and by the time she got home she was so short of breath I thought I was going to have to bring her back. Per [facility]- current oxygen is nocturnal.  Discussed patient not being available for home visits earlier in November and niece says that patient was probably at the doctor's office. Discussed that this does not address not returning voicemail messages.  She does feel that patient may benefit from skilled nursing short term for conitnuous medical assessments and interventions by a nurse.  Patient says that she uses her oxygen during the day and has told CM that she takes it to appointments but today she says she does not have portable tanks.  Have requested home oxygen assessment.  Provided patient with Med Alert brochure.  She is agreeable to pursue skill nursing.  She was denied ltac on a previous occasion and she  was thinking that skilled nursing facility placement was denied.  Updated CSW of need to seek SNF for medical stabilization

## 2017-08-25 NOTE — Progress Notes (Signed)
Pt was complaining about breathing this morning, but states shes breathing coimfortabley now at rest.

## 2017-08-26 DIAGNOSIS — Z515 Encounter for palliative care: Secondary | ICD-10-CM

## 2017-08-26 LAB — GLUCOSE, CAPILLARY
Glucose-Capillary: 114 mg/dL — ABNORMAL HIGH (ref 65–99)
Glucose-Capillary: 163 mg/dL — ABNORMAL HIGH (ref 65–99)

## 2017-08-26 MED ORDER — ALPRAZOLAM 0.25 MG PO TABS: 0.2500 mg | ORAL_TABLET | Freq: Three times a day (TID) | ORAL | 0 refills | Status: DC | PRN

## 2017-08-26 MED ORDER — PREDNISONE 10 MG PO TABS: ORAL_TABLET | ORAL | 0 refills | Status: DC

## 2017-08-26 MED ORDER — MORPHINE SULFATE ER 30 MG PO TBCR: 30.0000 mg | EXTENDED_RELEASE_TABLET | Freq: Two times a day (BID) | ORAL | 0 refills | Status: DC

## 2017-08-26 NOTE — Discharge Summary (Signed)
[facility] [facility] [place] NAME: Angela Pham    MR#:  [id number]  DATE OF BIRTH:  03-Jul-1941  DATE OF ADMISSION:  08/24/2017   ADMITTING PHYSICIAN: Nicholes Mango, MD  DATE OF DISCHARGE: 08/26/2017 PRIMARY CARE PHYSICIAN: Cletis Athens, MD   ADMISSION DIAGNOSIS:  Hypoxia [R09.02] COPD exacerbation ([place]) [J44.1] DISCHARGE DIAGNOSIS:  Active Problems:   Acute respiratory failure ([place])  SECONDARY DIAGNOSIS:   Past Medical History:  Diagnosis Date  . Asthma   . Chronic combined systolic and diastolic CHF (congestive heart failure) ([place])    a. TTE 02/2017: EF 45-50%, basal amd midanteroseptal HK b. 05/2017: echo showing EF of 35-40%, mild AI, moderate MR, and mildly dilated LA.   [place] Kitchen Coronary artery disease, non-occlusive    a. LHC 02/2017 normal coronary arteries  . Hyperlipidemia   . Hypertension   . Morbid obesity ([place])   . Pulmonary hypertension ([place])   . Type II diabetes mellitus [facility])    HOSPITAL COURSE:  76 year old female with past medical history of COPD CHF and obstructive sleep apnea and is presenting to the ED with a chief complaint of shortness of breath. Patient was just discharged from the hospital yesterday with a CHF exacerbation and mild COPD exacerbation   #Acute respiratory distress secondary to COPD exacerbation. Solu-Medrol, bronchodilator treatment COPD Gold protocol as patient is frequently getting admitted in the past several months Continue 2 L of oxygen.  change to prednisone taper. Per pulmonary consult,  encourage management of her dyspnea symptoms with medications for anxiety and dizziness such as opioids and benzodiazepines in moderate doses.    #. chronic systolic CHF, ejection fraction 45-50% Not fluid overloaded Chest x-rays revealing improving infiltrate and pulmonary edema. Continue Lasix, follow I's and O's and daily weights. -Continuebisoprolol, hydralazine,Aldactone.  #. Chronic kidney  disease stage III-creatinine is close to baseline.Stable.  #Obstructive sleep apnea continue CPAP nightly  #. Diabetes type 2 without complication-hold metformin, on sliding scale insulin.Resumemetformin after discharge.  #Anxiety-on Xanax as needed.  #Hyperlipidemia-continue atorvastatin.  #Iron def anemia -pt intolerant to po iron.IV iron was given during the previous admission -she will f/u with Hematology as out pt--Dr Mike Gip.  She needs skilled nursing facility placement.  DISCHARGE CONDITIONS:  Stable, discharge to SNF. CONSULTS OBTAINED:   DRUG ALLERGIES:   Allergies  Allergen Reactions  . Ace Inhibitors Anaphylaxis    Tongue swelling  . Shellfish Allergy Hives and Swelling    Swelling around face and mouth    DISCHARGE MEDICATIONS:   Allergies as of 08/26/2017      Reactions   Ace Inhibitors Anaphylaxis   Tongue swelling   Shellfish Allergy Hives, Swelling   Swelling around face and mouth      Medication List    STOP taking these medications   hydrALAZINE 25 MG tablet Commonly known as:  APRESOLINE     TAKE these medications   ALPRAZolam 0.25 MG tablet Commonly known as:  XANAX Take 1 tablet by mouth 3 (three) times daily as needed for anxiety or sleep.   arformoterol 15 MCG/2ML Nebu Commonly known as:  BROVANA Take 2 mLs (15 mcg total) 2 (two) times daily by nebulization. DX: COPD J44.9   aspirin 81 MG EC tablet Take 1 tablet (81 mg total) by mouth daily.   atorvastatin 40 MG tablet Commonly known as:  LIPITOR Take 40 mg by mouth daily.   budesonide 0.25 MG/2ML nebulizer solution Commonly known as:  PULMICORT Take 2 mLs (  0.25 mg total) 2 (two) times daily by nebulization. DX: COPD J44.9   carvedilol 6.25 MG tablet Commonly known as:  COREG Take 1 tablet (6.25 mg total) by mouth 2 (two) times daily. What changed:  when to take this   COMBIVENT IN Inhale 3 puffs into the lungs every 4 (four) hours as needed.   DUONEB  0.5-2.5 (3) MG/3ML Soln Generic drug:  ipratropium-albuterol Inhale 3 mLs into the lungs every 4 (four) hours as needed.   EYE VITAMINS PO Take 1 tablet by mouth 2 (two) times daily.   levocetirizine 5 MG tablet Commonly known as:  XYZAL Take 10 mg by mouth daily.   metFORMIN 850 MG tablet Commonly known as:  GLUCOPHAGE Take 1 tablet by mouth 3 (three) times daily.   mirtazapine 45 MG tablet Commonly known as:  REMERON Take 1 tablet by mouth at bedtime.   montelukast 10 MG tablet Commonly known as:  SINGULAIR Take 1 tablet (10 mg total) by mouth at bedtime.   morphine 30 MG 12 hr tablet Commonly known as:  MS CONTIN Take 1 tablet (30 mg total) by mouth every 12 (twelve) hours.   potassium chloride SA 20 MEQ tablet Commonly known as:  K-DUR,KLOR-CON Take 1 tablet (20 mEq total) by mouth daily.   senna-docusate 8.6-50 MG tablet Commonly known as:  Senokot-S Take 2 tablets by mouth 2 (two) times daily.   spironolactone 25 MG tablet Commonly known as:  ALDACTONE Take 0.5 tablets (12.5 mg total) by mouth daily.   tiotropium 18 MCG inhalation capsule Commonly known as:  SPIRIVA HANDIHALER Place 1 capsule (18 mcg total) into inhaler and inhale daily.   torsemide 20 MG tablet Commonly known as:  DEMADEX Take one tablet twicw a day for 1 week and then change it to daily. May take one extra tablet daily for weight gain >3lbs overnight or >5lbs in a week. What changed:    how much to take  when to take this  additional instructions        DISCHARGE INSTRUCTIONS:  See AVS.  If you experience worsening of your admission symptoms, develop shortness of breath, life threatening emergency, suicidal or homicidal thoughts you must seek medical attention immediately by calling 911 or calling your MD immediately  if symptoms less severe.  You Must read complete instructions/literature along with all the possible adverse reactions/side effects for all the Medicines you take  and that have been prescribed to you. Take any new Medicines after you have completely understood and accpet all the possible adverse reactions/side effects.   Please note  You were cared for by a hospitalist during your hospital stay. If you have any questions about your discharge medications or the care you received while you were in the hospital after you are discharged, you can call the unit and asked to speak with the hospitalist on call if the hospitalist that took care of you is not available. Once you are discharged, your primary care physician will handle any further medical issues. Please note that NO REFILLS for any discharge medications will be authorized once you are discharged, as it is imperative that you return to your primary care physician (or establish a relationship with a primary care physician if you do not have one) for your aftercare needs so that they can reassess your need for medications and monitor your lab values.    On the day of Discharge:  VITAL SIGNS:  Blood pressure 108/60, pulse 81, temperature 97.8 F (36.6  C), temperature source Oral, resp. rate 19, height 4\' 11"  (1.499 m), weight 175 lb 3.2 oz (79.5 kg), SpO2 93 %. PHYSICAL EXAMINATION:  GENERAL:  76 y.o.-year-old patient lying in the bed with no acute distress.  EYES: Pupils equal, round, reactive to light and accommodation. No scleral icterus. Extraocular muscles intact.  HEENT: Head atraumatic, normocephalic. Oropharynx and nasopharynx clear.  NECK:  Supple, no jugular venous distention. No thyroid enlargement, no tenderness.  LUNGS: diminished sounds bilaterally, tiny wheezing, rales,rhonchi or crepitation. No use of accessory muscles of respiration.  CARDIOVASCULAR: S1, S2 normal. No murmurs, rubs, or gallops.  ABDOMEN: Soft, non-tender, non-distended. Bowel sounds present. No organomegaly or mass.  EXTREMITIES: No pedal edema, cyanosis, or clubbing.  NEUROLOGIC: Cranial nerves II through XII are  intact. Muscle strength 5/5 in all extremities. Sensation intact. Gait not checked.  PSYCHIATRIC: The patient is alert and oriented x 3.  SKIN: No obvious rash, lesion, or ulcer.  DATA REVIEW:   CBC Recent Labs  Lab 08/25/17 0427  WBC 13.6*  HGB 7.5*  HCT 23.8*  PLT 273    Chemistries  Recent Labs  Lab 08/25/17 0427  NA 141  K 4.7  CL 109  CO2 25  GLUCOSE 204*  BUN 42*  CREATININE 1.56*  CALCIUM 9.1  AST 25  ALT 47  ALKPHOS 34  BILITOT 0.5     Microbiology Results  Results for orders placed or performed during the hospital encounter of 06/27/17  Culture, blood (Routine X 2) w Reflex to ID Panel     Status: None   Collection Time: 06/27/17  6:19 AM  Result Value Ref Range Status   Specimen Description BLOOD LEFT HAND  Final   Special Requests   Final    BOTTLES DRAWN AEROBIC AND ANAEROBIC Blood Culture adequate volume   Culture NO GROWTH 5 DAYS  Final   Report Status 07/02/2017 FINAL  Final  Culture, blood (Routine X 2) w Reflex to ID Panel     Status: None   Collection Time: 06/27/17  8:47 AM  Result Value Ref Range Status   Specimen Description BLOOD LEFT HAND  Final   Special Requests   Final    BOTTLES DRAWN AEROBIC AND ANAEROBIC Blood Culture adequate volume   Culture NO GROWTH 5 DAYS  Final   Report Status 07/02/2017 FINAL  Final  Urine Culture     Status: None   Collection Time: 07/03/17  4:40 PM  Result Value Ref Range Status   Specimen Description URINE, CLEAN CATCH  Final   Special Requests NONE  Final   Culture   Final    NO GROWTH Performed at [facility], [place] [address], [place], [place] [address]    Report Status 07/05/2017 FINAL  Final    RADIOLOGY:  No results found.   Management plans discussed with the patient, family and they are in agreement.  CODE STATUS: DNR   TOTAL TIME TAKING CARE OF THIS PATIENT: 36 minutes.    Demetrios Loll M.D on 08/26/2017 at 12:02 PM  Between 7am to 6pm - Pager - [phone]  After 6pm go  to [email or web address] - Proofreader  [facility]  Office  [phone]  CC: Primary care physician; Cletis Athens, MD   Note: This dictation was prepared with Dragon dictation along with smaller phrase technology. Any transcriptional errors that result from this process are unintentional.

## 2017-08-26 NOTE — Progress Notes (Addendum)
Daily Progress Note   Patient Name: Angela Pham       Date: 08/26/2017 DOB: 01/30/1941  Age: 76 y.o. MRN#: [id number] Attending Physician: Demetrios Loll, MD Primary Care Physician: Cletis Athens, MD Admit Date: 08/24/2017  Reason for Consultation/Follow-up: Establishing goals of care  Subjective: Angela Pham is resting in bed. She states she is feeling much better, and that the Morphine is really helping her. MSContin initiated yesterday by pulmonology. She does not desire hospice care.  She is discharging today to SNF. She will take things day by day. Palliative to follow outpatient.    MOST form completed yesterday and placed in chart. No feeding tube, no intubation, or ventilator (limited interventions). Ok for CPAP/BiPAP.   Length of Stay: 2  Current Medications: Scheduled Meds:  . arformoterol  15 mcg Nebulization BID  . aspirin  81 mg Oral Daily  . atorvastatin  40 mg Oral Daily  . budesonide  0.25 mg Nebulization BID  . carvedilol  6.25 mg Oral BID  . chlorhexidine  15 mL Mouth Rinse BID  . enoxaparin (LOVENOX) injection  30 mg Subcutaneous Q24H  . insulin aspart  0-15 Units Subcutaneous TID WC  . insulin aspart  0-5 Units Subcutaneous QHS  . ipratropium-albuterol  3 mL Inhalation Q6H  . loratadine  10 mg Oral Daily  . mouth rinse  15 mL Mouth Rinse q12n4p  . methylPREDNISolone (SOLU-MEDROL) injection  40 mg Intravenous Q24H  . mirtazapine  45 mg Oral QHS  . montelukast  10 mg Oral QHS  . morphine  30 mg Oral Q12H  . multivitamin with minerals  1 tablet Oral Daily  . multivitamin-lutein   Oral BID  . potassium chloride SA  20 mEq Oral Daily  . protein supplement shake  11 oz Oral BID BM  . senna-docusate  2 tablet Oral BID  . sodium chloride flush  3 mL Intravenous Q12H  .  spironolactone  12.5 mg Oral Daily  . tiotropium  18 mcg Inhalation Daily  . torsemide  20 mg Oral BID    Continuous Infusions:   PRN Meds: acetaminophen **OR** acetaminophen, albuterol, ALPRAZolam, ondansetron **OR** ondansetron (ZOFRAN) IV  Physical Exam  Constitutional: She does not appear ill. No distress.  HENT:  Head: Normocephalic.  Pulmonary/Chest: Effort normal.  Neurological: She is alert.  Oriented  Skin: Skin is warm and dry.            Vital Signs: BP 108/60 (BP Location: Left Arm)   Pulse 81   Temp 97.8 F (36.6 C) (Oral)   Resp 19   Ht 4\' 11"  (1.499 m)   Wt 79.5 kg (175 lb 3.2 oz)   SpO2 93%   BMI 35.39 kg/m  SpO2: SpO2: 93 % O2 Device: O2 Device: Not Delivered O2 Flow Rate: O2 Flow Rate (L/min): 2 L/min  Intake/output summary:   Intake/Output Summary (Last 24 hours) at 08/26/2017 1237 Last data filed at 08/26/2017 1005 Gross per 24 hour  Intake 1405 ml  Output -  Net 1405 ml   LBM: Last BM Date: 08/24/17 Baseline Weight: Weight: 80.3 kg (177 lb) Most recent weight: Weight: 79.5 kg (175 lb 3.2 oz)       Palliative Assessment/Data: 60%    Flowsheet Rows     Most Recent Value  Intake Tab  Referral Department  Hospitalist  Unit at Time of Referral  Med/Surg Unit  Palliative Care Primary Diagnosis  Pulmonary  Date Notified  08/24/17  Palliative Care Type  New Palliative care  Reason for referral  Clarify Goals of Care  Date of Admission  08/24/17  Date first seen by Palliative Care  08/25/17  # of days Palliative referral response time  1 Day(s)  # of days IP prior to Palliative referral  0  Clinical Assessment  Psychosocial & Spiritual Assessment  Palliative Care Outcomes      Patient Active Problem List   Diagnosis Date Noted  . Acute respiratory failure ([place]) 08/24/2017  . Acute URI 08/20/2017  . Acute respiratory failure with hypoxia ([place]) 08/03/2017  . DNR (do not resuscitate) 07/02/2017  . Respiratory failure ([place])  06/27/2017  . Chronic diastolic heart failure ([place]) 06/24/2017  . Asthma 06/24/2017  . Diabetes ([place]) 06/24/2017  . Essential hypertension 06/23/2017  . Hyperlipidemia, unspecified 06/23/2017  . CKD (chronic kidney disease) stage 3, GFR 30-59 ml/min ([facility]) 06/23/2017  . Pressure injury of skin 06/12/2017  . COPD exacerbation ([place]) 05/28/2017  . SOB (shortness of breath)   . Chronic combined systolic and diastolic heart failure ([place]) 03/07/2017    Palliative Care Assessment & Plan   Patient Profile: Ms. Shor is a 76 year old woman with history of chronic combined systolic and diastolic heart failure secondary to nonischemic cardiomyopathy, pulmonary hypertension, diabetes, hypertension, hyperlipidemia, chronic kidney disease, and COPD.     Assessment: Angela Pham feels much better today. She is happy to have rehab and ability not to focus on chores where she stays.   Recommendations/Plan:  Outpatient palliative.   Goals of Care and Additional Recommendations:  No feeding tube  Code Status:    Code Status Orders  (From admission, onward)        Start     Ordered   08/24/17 1246  Do not attempt resuscitation (DNR)  Continuous    Question Answer Comment  In the event of cardiac or respiratory ARREST Do not call a "code blue"   In the event of cardiac or respiratory ARREST Do not perform Intubation, CPR, defibrillation or ACLS   In the event of cardiac or respiratory ARREST Use medication by any route, position, wound care, and other measures to relive pain and suffering. May use oxygen, suction and manual treatment of airway obstruction as needed for comfort.   Comments RN may pronounce      08/24/17 1245  Code Status History    Date Active Date Inactive Code Status Order ID Comments User Context   08/20/2017 12:49 08/23/2017 21:42 DNR [id number]  Idelle Crouch, MD Inpatient   08/03/2017 14:09 08/06/2017 19:12 Full Code [id number]  Henreitta Leber, MD ED   06/27/2017  04:29 07/04/2017 16:59 DNR [id number]  Saundra Shelling, MD Inpatient   06/13/2017 04:25 06/16/2017 18:16 DNR [id number]  Mikael Spray, NP Inpatient   06/12/2017 03:28 06/13/2017 04:25 Full Code [id number]  Hugelmeyer, Ubaldo Glassing, DO Inpatient   06/12/2017 02:00 06/12/2017 03:28 DNR [id number]  Harvie Bridge, DO ED   05/29/2017 02:49 06/02/2017 21:23 DNR [id number]  Verlin Dike, RN Inpatient   05/28/2017 22:59 05/29/2017 02:49 Full Code [id number]  Harvie Bridge, DO Inpatient   05/04/2017 17:19 05/08/2017 17:18 DNR [id number]  Loletha Grayer, MD ED   03/09/2017 12:34 03/11/2017 22:08 Full Code [id number]  Minna Merritts, MD Inpatient   03/07/2017 05:12 03/09/2017 12:34 Full Code [id number]  Saundra Shelling, MD Inpatient    Advance Directive Documentation     Most Recent Value  Type of Advance Directive  Healthcare Power of Attorney, Living will  Pre-existing out of facility DNR order (yellow form or pink MOST form)  No data  "MOST" Form in Place?  No data       Prognosis:   Unable to determine Depending on her respiratory status.   Discharge Planning:  [facility] for rehab with Palliative care service follow-up  Thank you for allowing the Palliative Medicine Team to assist in the care of this patient.   Total Time 25 min Prolonged Time Billed No      Greater than 50%  of this time was spent counseling and coordinating care related to the above assessment and plan.  Asencion Gowda, NP 08/26/2017 12:54 PM Office: [phone] 7am-7pm  Please see Amion for pager number and availability  Call primary team after hours  Please contact Palliative Medicine Team phone at [phone] for questions and concerns.

## 2017-08-26 NOTE — Progress Notes (Signed)
Pt to be discharged to [facility] today. Valuables have been returned to her by security. Iv and tele removed. [facility]. Pt to be transported by e.m.s.

## 2017-08-26 NOTE — Clinical Social Work Placement (Signed)
   CLINICAL SOCIAL WORK PLACEMENT  NOTE  Date:  08/26/2017  Patient Details  Name: Angela Pham MRN: [id number] Date of Birth: 12-Jul-1941  Clinical Social Work is seeking post-discharge placement for this patient at the [place] level of care (*CSW will initial, date and re-position this form in  chart as items are completed):  Yes   Patient/family provided with [facility] Work Department's list of facilities offering this level of care within the geographic area requested by the patient (or if unable, by the patient's family).  Yes   Patient/family informed of their freedom to choose among providers that offer the needed level of care, that participate in Medicare, Medicaid or managed care program needed by the patient, have an available bed and are willing to accept the patient.  Yes   Patient/family informed of [facility]'s ownership interest in [facility] and [facility], as well as of the fact that they are under no obligation to receive care at these facilities.  PASRR submitted to EDS on 08/25/17     PASRR number received on 08/25/17     Existing PASRR number confirmed on       FL2 transmitted to all facilities in geographic area requested by pt/family on 08/25/17     FL2 transmitted to all facilities within larger geographic area on       Patient informed that his/her managed care company has contracts with or will negotiate with certain facilities, including the following:        Yes   Patient/family informed of bed offers received.  Patient [facility] bed at [facility]     Physician recommends and patient chooses bed at      Patient to be transferred to [facility] on 08/26/17.  Patient to be transferred to facility by [facility]     Patient family notified on 08/26/17 of transfer.  Name of family member notified:  Patient will notify her niece.     PHYSICIAN Please sign FL2, Please sign  DNR     Additional Comment:    _______________________________________________ Ross Ludwig, LCSWA 08/26/2017, 2:26 PM

## 2017-08-26 NOTE — Discharge Instructions (Signed)
Heart healthy and ADA diet. Weight daily.  CPAP at night. O2 [place] 2 L. Palliative care follow up.

## 2017-08-26 NOTE — Care Management Important Message (Signed)
Important Message  Patient Details  Name: Angela Pham MRN: [id number] Date of Birth: 09-Oct-1940   Medicare Important Message Given:  Yes Signed IM notice given   Katrina Stack, RN 08/26/2017, 9:29 AM

## 2017-08-26 NOTE — Clinical Social Work Note (Addendum)
CSW presented bed offers to patient and she chose [facility].  CSW contacted [facility] who can accept patient today.  Patient to be d/c'ed today to [facility].  Patient and family agreeable to plans will transport via ems RN to call report to [phone].  Patient has her own Bipap which is at hospital with her, she will bring it to SNF with her.   Evette Cristal, MSW, [place]

## 2017-08-26 NOTE — Progress Notes (Signed)
Patient's BP 99/51 at 0338; 101/55 on reassessment at 0416. Per MD Marcille Blanco, okay to hold 0600 dose of hydralazine. Nursing staff will continue to monitor for any changes in patient status. Earleen Reaper, RN

## 2017-08-30 ENCOUNTER — Telehealth: Payer: Self-pay | Admitting: *Deleted

## 2017-08-30 NOTE — Telephone Encounter (Signed)
PA for Garlon Hatchet has been faxed to [facility]. I had to call [facility] to get patient's medicaid ID [id number] Q. Pending medicaid decision.

## 2017-09-02 NOTE — Telephone Encounter (Signed)
Called to check the status of PA. Dx: J44.9 and J45.909 Talked to [facility] call ref: [id number] Angela Pham started PA per my request but stated Angela Pham is not covered by [facility] Medicaid. Awaiting 24 hrs decision Alen Bleacher [phone] Patient had Medicare as well and this has already been paid for once but patient wanted to also file thru [facility] Medicaid. Will check status off authorization on next week. Nothing further needed. [phone]

## 2017-09-16 ENCOUNTER — Encounter: Payer: Self-pay | Admitting: Pulmonary Disease

## 2017-09-16 ENCOUNTER — Ambulatory Visit (INDEPENDENT_AMBULATORY_CARE_PROVIDER_SITE_OTHER): Payer: 59 | Admitting: Pulmonary Disease

## 2017-09-16 VITALS — BP 140/86 | HR 101 | Ht 59.0 in | Wt 170.0 lb

## 2017-09-16 DIAGNOSIS — E668 Other obesity: Secondary | ICD-10-CM | POA: Diagnosis not present

## 2017-09-16 DIAGNOSIS — G4733 Obstructive sleep apnea (adult) (pediatric): Secondary | ICD-10-CM | POA: Diagnosis not present

## 2017-09-16 DIAGNOSIS — J449 Chronic obstructive pulmonary disease, unspecified: Secondary | ICD-10-CM | POA: Diagnosis not present

## 2017-09-16 DIAGNOSIS — I272 Pulmonary hypertension, unspecified: Secondary | ICD-10-CM | POA: Diagnosis not present

## 2017-09-16 NOTE — Patient Instructions (Signed)
Continue current regimen: Pulmicort, Brovana, Spiriva, Singulair, DuoNeb as you are currently doing Continue nocturnal CPAP Continue your excellent efforts at weight loss Follow-up in 3 months

## 2017-09-26 ENCOUNTER — Other Ambulatory Visit: Payer: Self-pay | Admitting: Hematology and Oncology

## 2017-09-26 ENCOUNTER — Inpatient Hospital Stay: Payer: 59 | Attending: Hematology and Oncology | Admitting: Hematology and Oncology

## 2017-09-26 DIAGNOSIS — D472 Monoclonal gammopathy: Secondary | ICD-10-CM | POA: Insufficient documentation

## 2017-09-26 DIAGNOSIS — D649 Anemia, unspecified: Secondary | ICD-10-CM | POA: Insufficient documentation

## 2017-09-26 NOTE — Progress Notes (Deleted)
[facility] day:  09/26/2017  Chief Complaint: Angela Pham is a 76 y.o. female with iron deficiency who is referred by Dr Demetrios Loll for assessment and management.  HPI: ***  She was admitted to [facility] from 08/03/2017 - 08/06/2017 with acute respiratory failure and hypoxia.  She has stage III chronic kidney disease.  She was readmitted to [facility] from 08/24/2017 - 08/26/2017 with hypoxia and a COPD exacerbation.  She was noted to be iron deficiennt and intoerant of oral iron.  She received Venofer 300 mg IV on 08/22/2017 and 08/23/2017.  Labs on 08/21/2017 revealed an iron saturation of 4% and a TIBC of 344.  Ferritin was 14 on 05/05/2017.  SPEP on 05/05/2017 revealed a 0.2 gm/dL monoclonal spike with an additional M spike at 0.2 gm/dL.  CBC on 08/25/2017 revealed a hematocrit of 23.8, hemoglobin 7.5, MCV 86.5, platelets 273,000, and WBC 13,600.  Creatinine was 1.56 (CrCl 32-37 ml/min).    Past Medical History:  Diagnosis Date  . Asthma   . Chronic combined systolic and diastolic CHF (congestive heart failure) ([place])    a. TTE 02/2017: EF 45-50%, basal amd midanteroseptal HK b. 05/2017: echo showing EF of 35-40%, mild AI, moderate MR, and mildly dilated LA.   Marland Kitchen Coronary artery disease, non-occlusive    a. LHC 02/2017 normal coronary arteries  . Hyperlipidemia   . Hypertension   . Morbid obesity ([place])   . Pulmonary hypertension ([place])   . Type II diabetes mellitus ([place])     Past Surgical History:  Procedure Laterality Date  . c-section    . RIGHT/LEFT HEART CATH AND CORONARY ANGIOGRAPHY N/A 03/09/2017   Procedure: Right/Left Heart Cath and Coronary Angiography;  Surgeon: Minna Merritts, MD;  Location: [facility];  Service: Cardiovascular;  Laterality: N/A;    Family History  Problem Relation Age of Onset  . CAD Father   . Colon cancer Sister   . Leukemia Brother   . Throat cancer Brother   . Cervical cancer Sister      Social History:  reports that she has quit smoking. Her smoking use included cigarettes. She has a 6.00 pack-year smoking history. she has never used smokeless tobacco. She reports that she does not drink alcohol or use drugs.  The patient is accompanied by *** alone today.  Allergies:  Allergies  Allergen Reactions  . Ace Inhibitors Anaphylaxis    Tongue swelling  . Shellfish Allergy Hives and Swelling    Swelling around face and mouth     Current Medications: Current Outpatient Medications  Medication Sig Dispense Refill  . ALPRAZolam (XANAX) 0.25 MG tablet Take 1 tablet (0.25 mg total) by mouth 3 (three) times daily as needed for anxiety or sleep. 14 tablet 0  . arformoterol (BROVANA) 15 MCG/2ML NEBU Take 2 mLs (15 mcg total) 2 (two) times daily by nebulization. DX: COPD J44.9 60 mL 0  . aspirin EC 81 MG EC tablet Take 1 tablet (81 mg total) by mouth daily.    Marland Kitchen atorvastatin (LIPITOR) 40 MG tablet Take 40 mg by mouth daily.    . budesonide (PULMICORT) 0.25 MG/2ML nebulizer solution Take 2 mLs (0.25 mg total) 2 (two) times daily by nebulization. DX: COPD J44.9 120 mL 0  . carvedilol (COREG) 6.25 MG tablet Take 1 tablet (6.25 mg total) by mouth 2 (two) times daily. (Patient taking differently: Take 6.25 mg by mouth daily. ) 180 tablet 3  . Ipratropium-Albuterol (COMBIVENT  IN) Inhale 3 puffs into the lungs every 4 (four) hours as needed.    Marland Kitchen ipratropium-albuterol (DUONEB) 0.5-2.5 (3) MG/3ML SOLN Inhale 3 mLs into the lungs every 4 (four) hours as needed.    Marland Kitchen levocetirizine (XYZAL) 5 MG tablet Take 10 mg by mouth daily.    . metFORMIN (GLUCOPHAGE) 850 MG tablet Take 1 tablet by mouth 3 (three) times daily.     . mirtazapine (REMERON) 45 MG tablet Take 1 tablet by mouth at bedtime.    . montelukast (SINGULAIR) 10 MG tablet Take 1 tablet (10 mg total) by mouth at bedtime. 30 tablet 0  . Multiple Vitamins-Minerals (EYE VITAMINS PO) Take 1 tablet by mouth 2 (two) times daily.     .  potassium chloride SA (K-DUR,KLOR-CON) 20 MEQ tablet Take 1 tablet (20 mEq total) by mouth daily. 30 tablet 0  . predniSONE (DELTASONE) 10 MG tablet 30 mg po daily for 1 day, 20 mg po daily for 1 day, 10 mg po daily for 1 day. 30 tablet 0  . senna-docusate (SENOKOT-S) 8.6-50 MG tablet Take 2 tablets by mouth 2 (two) times daily. 120 tablet 0  . spironolactone (ALDACTONE) 25 MG tablet Take 0.5 tablets (12.5 mg total) by mouth daily. 45 tablet 1  . tiotropium (SPIRIVA HANDIHALER) 18 MCG inhalation capsule Place 1 capsule (18 mcg total) into inhaler and inhale daily. 30 capsule 0  . torsemide (DEMADEX) 20 MG tablet Take one tablet twicw a day for 1 week and then change it to daily. May take one extra tablet daily for weight gain >3lbs overnight or >5lbs in a week. (Patient taking differently: 20 mg 2 (two) times daily. ) 60 tablet 3   No current facility-administered medications for this visit.     Review of Systems:  GENERAL:  Feels good.  Active.  No fevers, sweats or weight loss. PERFORMANCE STATUS (ECOG):  *** HEENT:  No visual changes, runny nose, sore throat, mouth sores or tenderness. Lungs: No shortness of breath or cough.  No hemoptysis. Cardiac:  No chest pain, palpitations, orthopnea, or PND. GI:  No nausea, vomiting, diarrhea, constipation, melena or hematochezia. GU:  No urgency, frequency, dysuria, or hematuria. Musculoskeletal:  No back pain.  No joint pain.  No muscle tenderness. Extremities:  No pain or swelling. Skin:  No rashes or skin changes. Neuro:  No headache, numbness or weakness, balance or coordination issues. Endocrine:  No diabetes, thyroid issues, hot flashes or night sweats. Psych:  No mood changes, depression or anxiety. Pain:  No focal pain. Review of systems:  All other systems reviewed and found to be negative.  Physical Exam: There were no vitals taken for this visit. GENERAL:  Well developed, well nourished, **man sitting comfortably in the exam room in  no acute distress. MENTAL STATUS:  Alert and oriented to person, place and time. HEAD:  *** hair.  Normocephalic, atraumatic, face symmetric, no Cushingoid features. EYES:  *** eyes.  Pupils equal round and reactive to light and accomodation.  No conjunctivitis or scleral icterus. ENT:  Oropharynx clear without lesion.  Tongue normal. Mucous membranes moist.  RESPIRATORY:  Clear to auscultation without rales, wheezes or rhonchi. CARDIOVASCULAR:  Regular rate and rhythm without murmur, rub or gallop. ABDOMEN:  Soft, non-tender, with active bowel sounds, and no hepatosplenomegaly.  No masses. SKIN:  No rashes, ulcers or lesions. EXTREMITIES: No edema, no skin discoloration or tenderness.  No palpable cords. LYMPH NODES: No palpable cervical, supraclavicular, axillary or inguinal adenopathy  NEUROLOGICAL: Unremarkable. PSYCH:  Appropriate.   No visits with results within 3 Day(s) from this visit.  Latest known visit with results is:  Admission on 08/24/2017, Discharged on 08/26/2017  Component Date Value Ref Range Status  . Sodium 08/24/2017 141  135 - 145 mmol/L Final  . Potassium 08/24/2017 4.5  3.5 - 5.1 mmol/L Final  . Chloride 08/24/2017 107  101 - 111 mmol/L Final  . CO2 08/24/2017 23  22 - 32 mmol/L Final  . Glucose, Bld 08/24/2017 150* 65 - 99 mg/dL Final  . BUN 08/24/2017 39* 6 - 20 mg/dL Final  . Creatinine, Ser 08/24/2017 1.53* 0.44 - 1.00 mg/dL Final  . Calcium 08/24/2017 8.9  8.9 - 10.3 mg/dL Final  . GFR calc non Af Amer 08/24/2017 32* >60 mL/min Final  . GFR calc Af Amer 08/24/2017 38* >60 mL/min Final   Comment: (NOTE) The eGFR has been calculated using the CKD EPI equation. This calculation has not been validated in all clinical situations. eGFR's persistently <60 mL/min signify possible Chronic Kidney Disease.   . Anion gap 08/24/2017 11  5 - 15 Final  . WBC 08/24/2017 11.9* 3.6 - 11.0 K/uL Final  . RBC 08/24/2017 2.91* 3.80 - 5.20 MIL/uL Final  . Hemoglobin  08/24/2017 8.0* 12.0 - 16.0 g/dL Final  . HCT 08/24/2017 25.4* 35.0 - 47.0 % Final  . MCV 08/24/2017 87.2  80.0 - 100.0 fL Final  . MCH 08/24/2017 27.7  26.0 - 34.0 pg Final  . MCHC 08/24/2017 31.7* 32.0 - 36.0 g/dL Final  . RDW 08/24/2017 18.2* 11.5 - 14.5 % Final  . Platelets 08/24/2017 278  150 - 440 K/uL Final  . Neutrophils Relative % 08/24/2017 87  % Final  . Neutro Abs 08/24/2017 10.4* 1.4 - 6.5 K/uL Final  . Lymphocytes Relative 08/24/2017 7  % Final  . Lymphs Abs 08/24/2017 0.8* 1.0 - 3.6 K/uL Final  . Monocytes Relative 08/24/2017 5  % Final  . Monocytes Absolute 08/24/2017 0.6  0.2 - 0.9 K/uL Final  . Eosinophils Relative 08/24/2017 1  % Final  . Eosinophils Absolute 08/24/2017 0.1  0 - 0.7 K/uL Final  . Basophils Relative 08/24/2017 0  % Final  . Basophils Absolute 08/24/2017 0.0  0 - 0.1 K/uL Final  . B Natriuretic Peptide 08/24/2017 676.0* 0.0 - 100.0 pg/mL Final  . Troponin I 08/24/2017 0.04* <0.03 ng/mL Final   CRITICAL VALUE NOTED. VALUE IS CONSISTENT WITH PREVIOUSLY REPORTED/CALLED VALUE...[place]  . Color, Urine 08/24/2017 STRAW* YELLOW Final  . APPearance 08/24/2017 CLEAR* CLEAR Final  . Specific Gravity, Urine 08/24/2017 1.005  1.005 - 1.030 Final  . pH 08/24/2017 5.0  5.0 - 8.0 Final  . Glucose, UA 08/24/2017 NEGATIVE  NEGATIVE mg/dL Final  . Hgb urine dipstick 08/24/2017 NEGATIVE  NEGATIVE Final  . Bilirubin Urine 08/24/2017 NEGATIVE  NEGATIVE Final  . Ketones, ur 08/24/2017 NEGATIVE  NEGATIVE mg/dL Final  . Protein, ur 08/24/2017 30* NEGATIVE mg/dL Final  . Nitrite 08/24/2017 NEGATIVE  NEGATIVE Final  . Leukocytes, UA 08/24/2017 NEGATIVE  NEGATIVE Final  . RBC / HPF 08/24/2017 0-5  0 - 5 RBC/hpf Final  . WBC, UA 08/24/2017 0-5  0 - 5 WBC/hpf Final  . Bacteria, UA 08/24/2017 NONE SEEN  NONE SEEN Final  . Squamous Epithelial / LPF 08/24/2017 0-5* NONE SEEN Final  . Influenza A By PCR 08/24/2017 NEGATIVE  NEGATIVE Final  . Influenza B By PCR 08/24/2017 NEGATIVE   NEGATIVE Final   Comment: (NOTE)  The Xpert Xpress Flu assay is intended as an aid in the diagnosis of  influenza and should not be used as a sole basis for treatment.  This  assay is FDA approved for nasopharyngeal swab specimens only. Nasal  washings and aspirates are unacceptable for Xpert Xpress Flu testing.   . Glucose-Capillary 08/24/2017 233* 65 - 99 mg/dL Final  . Glucose-Capillary 08/24/2017 279* 65 - 99 mg/dL Final  . Sodium 08/25/2017 141  135 - 145 mmol/L Final  . Potassium 08/25/2017 4.7  3.5 - 5.1 mmol/L Final  . Chloride 08/25/2017 109  101 - 111 mmol/L Final  . CO2 08/25/2017 25  22 - 32 mmol/L Final  . Glucose, Bld 08/25/2017 204* 65 - 99 mg/dL Final  . BUN 08/25/2017 42* 6 - 20 mg/dL Final  . Creatinine, Ser 08/25/2017 1.56* 0.44 - 1.00 mg/dL Final  . Calcium 08/25/2017 9.1  8.9 - 10.3 mg/dL Final  . Total Protein 08/25/2017 6.2* 6.5 - 8.1 g/dL Final  . Albumin 08/25/2017 3.0* 3.5 - 5.0 g/dL Final  . AST 08/25/2017 25  15 - 41 U/L Final  . ALT 08/25/2017 47  14 - 54 U/L Final  . Alkaline Phosphatase 08/25/2017 68  38 - 126 U/L Final  . Total Bilirubin 08/25/2017 0.5  0.3 - 1.2 mg/dL Final  . GFR calc non Af Amer 08/25/2017 32* >60 mL/min Final  . GFR calc Af Amer 08/25/2017 37* >60 mL/min Final   Comment: (NOTE) The eGFR has been calculated using the CKD EPI equation. This calculation has not been validated in all clinical situations. eGFR's persistently <60 mL/min signify possible Chronic Kidney Disease.   . Anion gap 08/25/2017 7  5 - 15 Final  . WBC 08/25/2017 13.6* 3.6 - 11.0 K/uL Final  . RBC 08/25/2017 2.75* 3.80 - 5.20 MIL/uL Final  . Hemoglobin 08/25/2017 7.5* 12.0 - 16.0 g/dL Final  . HCT 08/25/2017 23.8* 35.0 - 47.0 % Final  . MCV 08/25/2017 86.5  80.0 - 100.0 fL Final  . MCH 08/25/2017 27.1  26.0 - 34.0 pg Final  . MCHC 08/25/2017 31.4* 32.0 - 36.0 g/dL Final  . RDW 08/25/2017 17.8* 11.5 - 14.5 % Final  . Platelets 08/25/2017 273  150 - 440 K/uL  Final  . Glucose-Capillary 08/24/2017 173* 65 - 99 mg/dL Final  . Glucose-Capillary 08/25/2017 184* 65 - 99 mg/dL Final  . Comment 1 08/25/2017 Notify RN   Final  . Glucose-Capillary 08/25/2017 234* 65 - 99 mg/dL Final  . Comment 1 08/25/2017 Notify RN   Final  . Glucose-Capillary 08/25/2017 172* 65 - 99 mg/dL Final  . Comment 1 08/25/2017 Notify RN   Final  . Glucose-Capillary 08/25/2017 194* 65 - 99 mg/dL Final  . Glucose-Capillary 08/26/2017 114* 65 - 99 mg/dL Final  . Glucose-Capillary 08/26/2017 163* 65 - 99 mg/dL Final    Assessment:  Sahvannah Rieser is a 76 y.o. female ***  Plan: 1.   2.  Labs today:  CBC with diff, ferritin, iron studies, sed rate, B12, folate, TSH, myeloma panel, FLCA. 3.  Collect 24 hour urine for UPEP and free light chains. 4.  Preauth Venofer.    Lequita Asal, MD  09/26/2017, 5:01 AM

## 2017-09-27 NOTE — Progress Notes (Signed)
PULMONARY OFFICE FOLLOW UP NOTE  Requesting MD/Service: Hospitalist Service Date of initial consultation: 05/2016 Reason for consultation: Acute on chronic hypercarbic respiratory failure, COPD/asthma, OSA  PT PROFILE: 77 y.o. female never smoker with long standing asthma and repeated  Hospitalizations 03/2017 to 06/2017 for repeated episodes of hypercarbic respiratory failure. Also has history of CHF   DATA: 03/08/17 Echocardiogram: 45% to 50%. There is hypokinesis of the basal-midanteroseptal myocardium. LA mildly dilated. RSVP est 55-60 mmHg 05/28/17 CTA  Chest: No PE. No acute findings. PAs mildly dilated 06/23/17 PSG: Reduced sleep efficiency. AHI 10. Heavy snoring. Severe oxygen desaturation (lowest SpO2 71%) 07/21/17 CPAP titration: recommended CPAP 14 cm H2O (range 12-16) 11/21-12/20/18 Compliance: usage 28/30, > 4 hrs 27/28 days.   INTERVAL: Hospitalized 11/28-11/30 for "COPD exacerbation"   SUBJ: This is a post hospital follow up. Since recent hospitalization, she has done well. She is compliant with CPAP and meds as prescribed. She has difficulty affording Brovana.She is using Duonebs approx 3 times per day on avg.. Denies CP, fever, purulent sputum, hemoptysis, LE edema and calf tenderness.   Vitals:   09/16/17 1459  BP: 140/86  Pulse: (!) 101  SpO2: 96%  Weight: 77.1 kg (170 lb)  Height: 4\' 11"  (1.499 m)     EXAM:  Gen: No overt respiratory distress HEENT: NCAT, sclera white Neck: JVP not visualized Lungs: breath sounds moderately reduced, no wheezes  Cardiovascular: RRR, no murmurs noted Abdomen: obese soft, nontender, normal BS Ext: without clubbing, cyanosis, edema Neuro: grossly intact  DATA:   BMP Latest Ref Rng & Units 08/25/2017 08/24/2017 08/23/2017  Glucose 65 - 99 mg/dL 204(H) 150(H) 125(H)  BUN 6 - 20 mg/dL 42(H) 39(H) 41(H)  Creatinine 0.44 - 1.00 mg/dL 1.56(H) 1.53(H) 1.60(H)  BUN/Creat Ratio 12 - 28 - - -  Sodium 135 - 145 mmol/L 141 141  141  Potassium 3.5 - 5.1 mmol/L 4.7 4.5 4.2  Chloride 101 - 111 mmol/L 109 107 108  CO2 22 - 32 mmol/L 25 23 24   Calcium 8.9 - 10.3 mg/dL 9.1 8.9 8.0(L)    CBC Latest Ref Rng & Units 08/25/2017 08/24/2017 08/22/2017  WBC 3.6 - 11.0 K/uL 13.6(H) 11.9(H) 11.7(H)  Hemoglobin 12.0 - 16.0 g/dL 7.5(L) 8.0(L) 8.0(L)  Hematocrit 35.0 - 47.0 % 23.8(L) 25.4(L) 25.2(L)  Platelets 150 - 440 K/uL 273 278 251    CXR (08/24/17):   No infiltrates or edema  IMPRESSION:     ICD-10-CM   1. Chronic obstructive airway disease with asthma ([place]) J44.9   2. Moderate obesity E66.8   3. Pulmonary hypertension ([facility]) I27.20   4. Obstructive sleep apnea G47.33      PLAN:  Continue current regimen: Pulmicort, Brovana, Spiriva, Singulair, DuoNeb as currently doing Continue nocturnal CPAP Continue excellent efforts at weight loss Follow-up in 3 months   Merton Border, MD PCCM service Mobile [phone] Pager [phone] 09/27/2017 6:58 PM

## 2017-10-03 ENCOUNTER — Ambulatory Visit: Payer: 59 | Admitting: Family

## 2017-10-06 ENCOUNTER — Telehealth: Payer: Self-pay | Admitting: Pulmonary Disease

## 2017-10-06 MED ORDER — IPRATROPIUM-ALBUTEROL 0.5-2.5 (3) MG/3ML IN SOLN: 3.0000 mL | RESPIRATORY_TRACT | 5 refills | Status: DC | PRN

## 2017-10-06 MED ORDER — BUDESONIDE 0.25 MG/2ML IN SUSP: 0.2500 mg | Freq: Two times a day (BID) | RESPIRATORY_TRACT | 5 refills | Status: AC

## 2017-10-06 NOTE — Telephone Encounter (Signed)
Pt notified rx sent.

## 2017-10-06 NOTE — Telephone Encounter (Signed)
Patient cannot afford these .  Please call to discuss other options

## 2017-10-06 NOTE — Telephone Encounter (Signed)
°*  STAT* If patient is at the pharmacy, call can be transferred to refill team.   1. Which medications need to be refilled? (please list name of each medication and dose if known)  budesonide PULMICORT 0.25 MG/2ML arformoterol BROVANA 15 MCG/2ML  2. Which pharmacy/location (including street and city if local pharmacy) is medication [facility] sent to[facility] [place]   3. Do they need a 30 day or 90 day supply? Month supply of each, tell pharmacist to run to Medicare not Medicaid  Would like confirmation call  Please call

## 2017-10-06 NOTE — Telephone Encounter (Signed)
Patient called to request refill of Brovana and Pulmicort. She called her pharmacy to request cost. Patient called back stating she needs something cheaper as she can't afford these medications. Please advise.

## 2017-10-07 MED ORDER — SALMETEROL XINAFOATE 50 MCG/DOSE IN AEPB
1.0000 | INHALATION_SPRAY | Freq: Two times a day (BID) | RESPIRATORY_TRACT | 11 refills | Status: AC
Start: 2017-10-07 — End: 2018-10-07

## 2017-10-07 NOTE — Addendum Note (Signed)
Addended by: Alva Garnet, Tawna Alwin B on: 10/07/2017 01:09 PM   Modules accepted: Orders

## 2017-10-07 NOTE — Telephone Encounter (Signed)
We can go back to any ICS/LABA ... Breo, Advair, Symbicort, Dulera. Call her to see if she has any preference, please   Angela Pham

## 2017-10-07 NOTE — Telephone Encounter (Signed)
Pt states she can do Pulmicort only if that is ok. She has tried Advair 500/50 before. Please advise.

## 2017-10-07 NOTE — Telephone Encounter (Signed)
Serevent diskus ordered in place of arformoterol  Angela Pham

## 2017-10-07 NOTE — Telephone Encounter (Signed)
Pt informed of medication change. Nothing further needed.

## 2017-10-25 ENCOUNTER — Telehealth: Payer: Self-pay | Admitting: Pulmonary Disease

## 2017-10-25 NOTE — Telephone Encounter (Signed)
Pt needs a letter sent to [facility] stating pt has been seen by Dr. Alva Garnet for documentation.

## 2017-10-25 NOTE — Telephone Encounter (Signed)
Spoke with Ailene Ravel at [facility] and she states she will inform Corene Cornea that  He can pull note from 09/16/17 when pt seen DS. Nothing further needed.

## 2017-10-25 NOTE — Telephone Encounter (Signed)
Routed to pulmonary 

## 2017-10-26 NOTE — Telephone Encounter (Signed)
Patient called regarding status of message [facility] was informed and nothing further needed at this time

## 2017-11-11 ENCOUNTER — Inpatient Hospital Stay
Admission: EM | Admit: 2017-11-11 | Discharge: 2017-11-12 | DRG: 291 | Disposition: A | Discharge status: Home Health | Payer: 59 | Attending: Internal Medicine | Admitting: Internal Medicine

## 2017-11-11 ENCOUNTER — Encounter: Payer: Self-pay | Admitting: Emergency Medicine

## 2017-11-11 ENCOUNTER — Emergency Department: Payer: 59

## 2017-11-11 ENCOUNTER — Other Ambulatory Visit: Payer: Self-pay

## 2017-11-11 DIAGNOSIS — I5043 Acute on chronic combined systolic (congestive) and diastolic (congestive) heart failure: Secondary | ICD-10-CM | POA: Diagnosis present

## 2017-11-11 DIAGNOSIS — I248 Other forms of acute ischemic heart disease: Secondary | ICD-10-CM | POA: Diagnosis present

## 2017-11-11 DIAGNOSIS — I2510 Atherosclerotic heart disease of native coronary artery without angina pectoris: Secondary | ICD-10-CM | POA: Diagnosis present

## 2017-11-11 DIAGNOSIS — N179 Acute kidney failure, unspecified: Secondary | ICD-10-CM | POA: Diagnosis present

## 2017-11-11 DIAGNOSIS — I272 Pulmonary hypertension, unspecified: Secondary | ICD-10-CM | POA: Diagnosis present

## 2017-11-11 DIAGNOSIS — R0603 Acute respiratory distress: Secondary | ICD-10-CM | POA: Diagnosis not present

## 2017-11-11 DIAGNOSIS — Z7984 Long term (current) use of oral hypoglycemic drugs: Secondary | ICD-10-CM | POA: Diagnosis not present

## 2017-11-11 DIAGNOSIS — G4733 Obstructive sleep apnea (adult) (pediatric): Secondary | ICD-10-CM | POA: Diagnosis present

## 2017-11-11 DIAGNOSIS — E1122 Type 2 diabetes mellitus with diabetic chronic kidney disease: Secondary | ICD-10-CM | POA: Diagnosis present

## 2017-11-11 DIAGNOSIS — E785 Hyperlipidemia, unspecified: Secondary | ICD-10-CM | POA: Diagnosis present

## 2017-11-11 DIAGNOSIS — N183 Chronic kidney disease, stage 3 (moderate): Secondary | ICD-10-CM | POA: Diagnosis present

## 2017-11-11 DIAGNOSIS — I130 Hypertensive heart and chronic kidney disease with heart failure and stage 1 through stage 4 chronic kidney disease, or unspecified chronic kidney disease: Principal | ICD-10-CM | POA: Diagnosis present

## 2017-11-11 DIAGNOSIS — J449 Chronic obstructive pulmonary disease, unspecified: Secondary | ICD-10-CM | POA: Diagnosis present

## 2017-11-11 DIAGNOSIS — Z87891 Personal history of nicotine dependence: Secondary | ICD-10-CM | POA: Diagnosis not present

## 2017-11-11 DIAGNOSIS — I509 Heart failure, unspecified: Secondary | ICD-10-CM

## 2017-11-11 DIAGNOSIS — Z6832 Body mass index (BMI) 32.0-32.9, adult: Secondary | ICD-10-CM | POA: Diagnosis not present

## 2017-11-11 DIAGNOSIS — R748 Abnormal levels of other serum enzymes: Secondary | ICD-10-CM | POA: Diagnosis not present

## 2017-11-11 DIAGNOSIS — J4551 Severe persistent asthma with (acute) exacerbation: Secondary | ICD-10-CM | POA: Diagnosis not present

## 2017-11-11 DIAGNOSIS — Z7982 Long term (current) use of aspirin: Secondary | ICD-10-CM | POA: Diagnosis not present

## 2017-11-11 DIAGNOSIS — Z79899 Other long term (current) drug therapy: Secondary | ICD-10-CM

## 2017-11-11 DIAGNOSIS — R0602 Shortness of breath: Secondary | ICD-10-CM

## 2017-11-11 DIAGNOSIS — I5022 Chronic systolic (congestive) heart failure: Secondary | ICD-10-CM | POA: Diagnosis not present

## 2017-11-11 DIAGNOSIS — I5032 Chronic diastolic (congestive) heart failure: Secondary | ICD-10-CM

## 2017-11-11 DIAGNOSIS — I5042 Chronic combined systolic (congestive) and diastolic (congestive) heart failure: Secondary | ICD-10-CM | POA: Diagnosis not present

## 2017-11-11 DIAGNOSIS — I5030 Unspecified diastolic (congestive) heart failure: Secondary | ICD-10-CM

## 2017-11-11 HISTORY — DX: Obstructive sleep apnea (adult) (pediatric): G47.33

## 2017-11-11 LAB — CREATININE, SERUM
Creatinine, Ser: 1.71 mg/dL — ABNORMAL HIGH (ref 0.44–1.00)
GFR, EST AFRICAN AMERICAN: 33 mL/min — AB (ref >60)
GFR, EST NON AFRICAN AMERICAN: 28 mL/min — AB (ref >60)

## 2017-11-11 LAB — BASIC METABOLIC PANEL
Anion gap: 12 (ref 5–15)
BUN: 47 mg/dL — AB (ref 6–20)
CALCIUM: 8.9 mg/dL (ref 8.9–10.3)
CO2: 21 mmol/L — AB (ref 22–32)
Chloride: 110 mmol/L (ref 101–111)
Creatinine, Ser: 1.74 mg/dL — ABNORMAL HIGH (ref 0.44–1.00)
GFR calc non Af Amer: 28 mL/min — ABNORMAL LOW (ref >60)
GFR, EST AFRICAN AMERICAN: 32 mL/min — AB (ref >60)
GLUCOSE: 180 mg/dL — AB (ref 65–99)
Potassium: 4.3 mmol/L (ref 3.5–5.1)
Sodium: 143 mmol/L (ref 135–145)

## 2017-11-11 LAB — CBC
HCT: 34 % — ABNORMAL LOW (ref 35.0–47.0)
HCT: 31 % — ABNORMAL LOW (ref 35.0–47.0)
Hemoglobin: 11 g/dL — ABNORMAL LOW (ref 12.0–16.0)
Hemoglobin: 10 g/dL — ABNORMAL LOW (ref 12.0–16.0)
MCH: 28.5 pg (ref 26.0–34.0)
MCH: 28.1 pg (ref 26.0–34.0)
MCHC: 32.3 g/dL (ref 32.0–36.0)
MCHC: 32.1 g/dL (ref 32.0–36.0)
MCV: 88.3 fL (ref 80.0–100.0)
MCV: 87.5 fL (ref 80.0–100.0)
PLATELETS: 341 10*3/uL (ref 150–440)
PLATELETS: 296 10*3/uL (ref 150–440)
RBC: 3.85 MIL/uL (ref 3.80–5.20)
RBC: 3.55 MIL/uL — ABNORMAL LOW (ref 3.80–5.20)
RDW: 19.1 % — AB (ref 11.5–14.5)
RDW: 19.2 % — AB (ref 11.5–14.5)
WBC: 12.6 10*3/uL — ABNORMAL HIGH (ref 3.6–11.0)
WBC: 10.7 10*3/uL (ref 3.6–11.0)

## 2017-11-11 LAB — INFLUENZA PANEL BY PCR (TYPE A & B)
Influenza A By PCR: NEGATIVE
Influenza B By PCR: NEGATIVE

## 2017-11-11 LAB — GLUCOSE, CAPILLARY
GLUCOSE-CAPILLARY: 125 mg/dL — AB (ref 65–99)
GLUCOSE-CAPILLARY: 125 mg/dL — AB (ref 65–99)
Glucose-Capillary: 118 mg/dL — ABNORMAL HIGH (ref 65–99)
Glucose-Capillary: 101 mg/dL — ABNORMAL HIGH (ref 65–99)

## 2017-11-11 LAB — TROPONIN I
TROPONIN I: 0.05 ng/mL — AB (ref <0.03)
Troponin I: 0.06 ng/mL (ref <0.03)
Troponin I: 0.05 ng/mL (ref <0.03)
Troponin I: 0.05 ng/mL (ref <0.03)

## 2017-11-11 LAB — BRAIN NATRIURETIC PEPTIDE: B NATRIURETIC PEPTIDE 5: 906 pg/mL — AB (ref 0.0–100.0)

## 2017-11-11 MED ORDER — IPRATROPIUM-ALBUTEROL 0.5-2.5 (3) MG/3ML IN SOLN
3.0000 mL | Freq: Once | RESPIRATORY_TRACT | Status: AC
  Administered 2017-11-11: 3 mL via RESPIRATORY_TRACT

## 2017-11-11 MED ORDER — LORATADINE 10 MG PO TABS
10.0000 mg | ORAL_TABLET | Freq: Every day | ORAL | Status: DC
  Administered 2017-11-11 – 2017-11-12 (×2): 10 mg via ORAL
  Filled 2017-11-11 (×2): qty 1

## 2017-11-11 MED ORDER — ASPIRIN EC 81 MG PO TBEC
81.0000 mg | DELAYED_RELEASE_TABLET | Freq: Every day | ORAL | Status: DC
  Administered 2017-11-11 – 2017-11-12 (×2): 81 mg via ORAL
  Filled 2017-11-11 (×2): qty 1

## 2017-11-11 MED ORDER — CARVEDILOL 6.25 MG PO TABS
6.2500 mg | ORAL_TABLET | Freq: Two times a day (BID) | ORAL | Status: DC
  Administered 2017-11-11 – 2017-11-12 (×3): 6.25 mg via ORAL
  Filled 2017-11-11 (×3): qty 1

## 2017-11-11 MED ORDER — FUROSEMIDE 10 MG/ML IJ SOLN
40.0000 mg | Freq: Once | INTRAMUSCULAR | Status: AC
  Administered 2017-11-11: 40 mg via INTRAVENOUS
  Filled 2017-11-11: qty 4

## 2017-11-11 MED ORDER — ACETAMINOPHEN 325 MG PO TABS: 650.0000 mg | ORAL_TABLET | ORAL | Status: DC | PRN

## 2017-11-11 MED ORDER — ALPRAZOLAM 0.25 MG PO TABS: 0.2500 mg | ORAL_TABLET | Freq: Three times a day (TID) | ORAL | Status: DC | PRN

## 2017-11-11 MED ORDER — INSULIN ASPART 100 UNIT/ML ~~LOC~~ SOLN
0.0000 [IU] | Freq: Three times a day (TID) | SUBCUTANEOUS | Status: DC
  Filled 2017-11-11: qty 1

## 2017-11-11 MED ORDER — BUDESONIDE 0.25 MG/2ML IN SUSP
0.2500 mg | Freq: Two times a day (BID) | RESPIRATORY_TRACT | Status: DC
  Administered 2017-11-11 – 2017-11-12 (×3): 0.25 mg via RESPIRATORY_TRACT
  Filled 2017-11-11 (×3): qty 2

## 2017-11-11 MED ORDER — KETOTIFEN FUMARATE 0.025 % OP SOLN
1.0000 [drp] | Freq: Two times a day (BID) | OPHTHALMIC | Status: DC
  Administered 2017-11-11 – 2017-11-12 (×3): 1 [drp] via OPHTHALMIC
  Filled 2017-11-11: qty 5

## 2017-11-11 MED ORDER — ARFORMOTEROL TARTRATE 15 MCG/2ML IN NEBU: 15.0000 ug | INHALATION_SOLUTION | Freq: Two times a day (BID) | RESPIRATORY_TRACT | Status: DC

## 2017-11-11 MED ORDER — IPRATROPIUM-ALBUTEROL 0.5-2.5 (3) MG/3ML IN SOLN
3.0000 mL | RESPIRATORY_TRACT | Status: DC | PRN
  Administered 2017-11-11: 3 mL via RESPIRATORY_TRACT
  Filled 2017-11-11: qty 3

## 2017-11-11 MED ORDER — FUROSEMIDE 10 MG/ML IJ SOLN
40.0000 mg | Freq: Two times a day (BID) | INTRAMUSCULAR | Status: DC
  Administered 2017-11-11 – 2017-11-12 (×3): 40 mg via INTRAVENOUS
  Filled 2017-11-11 (×3): qty 4

## 2017-11-11 MED ORDER — ENOXAPARIN SODIUM 30 MG/0.3ML ~~LOC~~ SOLN
30.0000 mg | SUBCUTANEOUS | Status: DC
  Administered 2017-11-11 – 2017-11-12 (×2): 30 mg via SUBCUTANEOUS
  Filled 2017-11-11 (×2): qty 0.3

## 2017-11-11 MED ORDER — SODIUM CHLORIDE 0.9 % IV SOLN: 250.0000 mL | INTRAVENOUS | Status: DC | PRN

## 2017-11-11 MED ORDER — TIOTROPIUM BROMIDE MONOHYDRATE 18 MCG IN CAPS
18.0000 ug | ORAL_CAPSULE | Freq: Every day | RESPIRATORY_TRACT | Status: DC
  Administered 2017-11-11 – 2017-11-12 (×2): 18 ug via RESPIRATORY_TRACT
  Filled 2017-11-11: qty 5

## 2017-11-11 MED ORDER — METFORMIN HCL 850 MG PO TABS
850.0000 mg | ORAL_TABLET | Freq: Three times a day (TID) | ORAL | Status: DC
  Administered 2017-11-11 – 2017-11-12 (×3): 850 mg via ORAL
  Filled 2017-11-11 (×5): qty 1

## 2017-11-11 MED ORDER — ARFORMOTEROL TARTRATE 15 MCG/2ML IN NEBU
15.0000 ug | INHALATION_SOLUTION | Freq: Two times a day (BID) | RESPIRATORY_TRACT | Status: DC
  Administered 2017-11-11 – 2017-11-12 (×2): 15 ug via RESPIRATORY_TRACT
  Filled 2017-11-11 (×4): qty 2

## 2017-11-11 MED ORDER — SENNOSIDES-DOCUSATE SODIUM 8.6-50 MG PO TABS: 2.0000 | ORAL_TABLET | Freq: Two times a day (BID) | ORAL | Status: DC

## 2017-11-11 MED ORDER — SODIUM CHLORIDE 0.9% FLUSH
3.0000 mL | Freq: Two times a day (BID) | INTRAVENOUS | Status: DC
  Administered 2017-11-11 – 2017-11-12 (×3): 3 mL via INTRAVENOUS

## 2017-11-11 MED ORDER — SPIRONOLACTONE 12.5 MG HALF TABLET
12.5000 mg | ORAL_TABLET | Freq: Every day | ORAL | Status: DC
  Administered 2017-11-11 – 2017-11-12 (×2): 12.5 mg via ORAL
  Filled 2017-11-11 (×2): qty 1

## 2017-11-11 MED ORDER — IPRATROPIUM-ALBUTEROL 0.5-2.5 (3) MG/3ML IN SOLN
RESPIRATORY_TRACT | Status: AC
  Administered 2017-11-11: 3 mL via RESPIRATORY_TRACT
  Filled 2017-11-11: qty 6

## 2017-11-11 MED ORDER — ONDANSETRON HCL 4 MG/2ML IJ SOLN: 4.0000 mg | Freq: Four times a day (QID) | INTRAMUSCULAR | Status: DC | PRN

## 2017-11-11 MED ORDER — OCUVITE-LUTEIN PO CAPS
1.0000 | ORAL_CAPSULE | Freq: Two times a day (BID) | ORAL | Status: DC
  Administered 2017-11-11 – 2017-11-12 (×3): 1 via ORAL
  Filled 2017-11-11 (×5): qty 1

## 2017-11-11 MED ORDER — SENNOSIDES-DOCUSATE SODIUM 8.6-50 MG PO TABS
1.0000 | ORAL_TABLET | Freq: Every morning | ORAL | Status: DC
  Administered 2017-11-11 – 2017-11-12 (×2): 1 via ORAL
  Filled 2017-11-11 (×2): qty 1

## 2017-11-11 MED ORDER — IPRATROPIUM-ALBUTEROL 0.5-2.5 (3) MG/3ML IN SOLN
3.0000 mL | Freq: Two times a day (BID) | RESPIRATORY_TRACT | Status: DC
  Administered 2017-11-11 – 2017-11-12 (×3): 3 mL via RESPIRATORY_TRACT
  Filled 2017-11-11 (×3): qty 3

## 2017-11-11 MED ORDER — MIRTAZAPINE 15 MG PO TABS
45.0000 mg | ORAL_TABLET | Freq: Every day | ORAL | Status: DC
  Administered 2017-11-11: 45 mg via ORAL
  Filled 2017-11-11: qty 3

## 2017-11-11 MED ORDER — SENNOSIDES-DOCUSATE SODIUM 8.6-50 MG PO TABS
2.0000 | ORAL_TABLET | Freq: Every day | ORAL | Status: DC
  Administered 2017-11-11: 2 via ORAL
  Filled 2017-11-11: qty 2

## 2017-11-11 MED ORDER — MONTELUKAST SODIUM 10 MG PO TABS
10.0000 mg | ORAL_TABLET | Freq: Every day | ORAL | Status: DC
  Administered 2017-11-11: 10 mg via ORAL
  Filled 2017-11-11: qty 1

## 2017-11-11 MED ORDER — SODIUM CHLORIDE 0.9% FLUSH: 3.0000 mL | INTRAVENOUS | Status: DC | PRN

## 2017-11-11 MED ORDER — ATORVASTATIN CALCIUM 20 MG PO TABS
40.0000 mg | ORAL_TABLET | Freq: Every day | ORAL | Status: DC
  Administered 2017-11-11: 40 mg via ORAL
  Filled 2017-11-11: qty 2

## 2017-11-11 MED ORDER — INSULIN ASPART 100 UNIT/ML ~~LOC~~ SOLN: 0.0000 [IU] | Freq: Every day | SUBCUTANEOUS | Status: DC

## 2017-11-11 NOTE — ED Triage Notes (Signed)
Pt arrived via EMS from home where EMS reports pt called out for respiratory distress due to pt not able to get relief from home nebulizer and CPAP. Pt has Hx/o CHF and asthma. Pt on CPAP on arrival. MD at bedside for further evaluation. O2 on RA without CPAP is 94%.

## 2017-11-11 NOTE — Progress Notes (Signed)
Talked to Dr. Duane Boston about patient requesting to wear CPAP at noc as she wears CPAP at night, order given. RN will continue to monitor.

## 2017-11-11 NOTE — Progress Notes (Signed)
Pt refusing bed alarm but was educated about safety. Will continue to monitor.

## 2017-11-11 NOTE — Plan of Care (Signed)
  Progressing Clinical Measurements: Ability to maintain clinical measurements within normal limits will improve 11/11/2017 0700 - Progressing by Liliane Channel, RN Respiratory complications will improve 11/11/2017 0700 - Progressing by Liliane Channel, RN Pain Managment: General experience of comfort will improve 11/11/2017 0700 - Progressing by Liliane Channel, RN Safety: Ability to remain free from injury will improve 11/11/2017 0700 - Progressing by Liliane Channel, RN

## 2017-11-11 NOTE — ED Notes (Signed)
Date and time results received: 11/11/17   Test: Troponin Critical Value: 0.05  Name of Provider Notified: Archie Balboa

## 2017-11-11 NOTE — ED Notes (Signed)
BIPAP taken off and pt placed on nasal canula at 3L.

## 2017-11-11 NOTE — Progress Notes (Signed)
Pharmacy dose adjustment: lovenox Patient ordered lovenox 40 mg subq daily. Patient's CrCl 25.4 ml/min  Will readjust dose to lovenox 30 mg subq daily.  Tobie Lords, PharmD, BCPS Clinical Pharmacist 11/11/2017

## 2017-11-11 NOTE — Consult Note (Signed)
Cardiology Consult    Patient ID: Angela Pham MRN: [id number], DOB/AGE: 75-Apr-1942   Admit date: 11/11/2017 Date of Consult: 11/11/2017  Primary Physician: Cletis Athens, MD Primary Cardiologist: Kathlyn Sacramento, MD Requesting Provider: Chauncey Cruel. Posey Pronto, MD  Patient Profile    Angela Pham is a 77 y.o. female with a history of combined CHF, HTN, HL, obesity, DM, PAH, and asthma, who is being seen today for the evaluation of CHF at the request of Dr. Posey Pronto.  Past Medical History   Past Medical History:  Diagnosis Date  . Asthma   . Chronic combined systolic and diastolic CHF (congestive heart failure) ([place])    a. TTE 02/2017: EF 45-50%, basal amd midanteroseptal HK b. 05/2017: echo showing EF of 35-40%, mild AI, moderate MR, and mildly dilated LA.   Marland Kitchen Coronary artery disease, non-occlusive    a. LHC 02/2017 normal coronary arteries  . Hyperlipidemia   . Hypertension   . Morbid obesity ([place])   . OSA (obstructive sleep apnea)    a. On CPAP w/ O2 bleed in.  . Pulmonary hypertension ([place])   . Type II diabetes mellitus ([place])     Past Surgical History:  Procedure Laterality Date  . c-section    . RIGHT/LEFT HEART CATH AND CORONARY ANGIOGRAPHY N/A 03/09/2017   Procedure: Right/Left Heart Cath and Coronary Angiography;  Surgeon: Minna Merritts, MD;  Location: [facility];  Service: Cardiovascular;  Laterality: N/A;     Allergies  Allergies  Allergen Reactions  . Ace Inhibitors Anaphylaxis    Tongue swelling  . Shellfish Allergy Hives and Swelling    Swelling around face and mouth     History of Present Illness    77 y/o ? with a h/o combined CHF, obesity, HTN, HL, PAH, DM, and PAH.  She was previously evaluated in the setting of CHF and dyspnea in 02/2017 with echo @ that time revealing an EF of 45-50%.  Cath showed nl cors and PAH.  F/u echo in 05/2017, in the setting of repeat admission for resp failure, showed further depression of LV fxn, with an EF of 35-40%. She was  admitted twice for resp failure in late November.  On the first admission she was treated for volume overload and bumped her creat with IV lasix.  It was ultimately felt that presentation was more consistent with AECOPD/Ashtma.  Following d/c, she was readmitted the following day and again treated for COPD flare.  She says that she's been doing well from a volume standpoint.  She has been compliant with meds and careful with her salt intake, though she did have some fast food this past Tuesday, 2/12. On 2/14, she felt well throughout the day and evening but prior to bed, noted mild wheezing.  She used her nebs and felt stable, and went off to bed.  Around 11p, she awoke with worsening dyspnea and wheezing.  She called EMS and was found to be hypoxic and was treated with a neb and placed on CPAP.  She was transported to the ED where she was mildly hypertensive and tachycardic (sinus).  CXR showed vascular congestion. Trop was mildly elevated @ 0.05.  She was given a neb and lasix 40 IV and subsequently admitted.  This AM, she feels much better.  Wheezing has subsided.  She denies any recent development of edema or change in abd girth.  Wt has been stable @ home ~ 170 lbs.  Inpatient Medications    . arformoterol  15  mcg Nebulization BID  . aspirin EC  81 mg Oral Daily  . atorvastatin  40 mg Oral Daily  . budesonide  0.25 mg Nebulization BID  . carvedilol  6.25 mg Oral BID  . enoxaparin (LOVENOX) injection  30 mg Subcutaneous Q24H  . furosemide  40 mg Intravenous BID  . insulin aspart  0-15 Units Subcutaneous TID WC  . insulin aspart  0-5 Units Subcutaneous QHS  . ipratropium-albuterol  3 mL Nebulization BID  . ketotifen  1 drop Both Eyes BID  . loratadine  10 mg Oral Daily  . metFORMIN  850 mg Oral TID WC  . mirtazapine  45 mg Oral QHS  . montelukast  10 mg Oral QHS  . multivitamin-lutein  1 capsule Oral BID  . senna-docusate  1 tablet Oral q morning - 10a  . senna-docusate  2 tablet Oral QHS    . sodium chloride flush  3 mL Intravenous Q12H  . spironolactone  12.5 mg Oral Daily  . tiotropium  18 mcg Inhalation Daily    Family History    Family History  Problem Relation Age of Onset  . CAD Father   . Colon cancer Sister   . Leukemia Brother   . Throat cancer Brother   . Cervical cancer Sister    indicated that her mother is deceased. She indicated that her father is deceased. She indicated that both of her sisters are deceased. She indicated that both of her brothers are deceased.   Social History    Social History   Socioeconomic History  . Marital status: Widowed    Spouse name: Not on file  . Number of children: Not on file  . Years of education: Not on file  . Highest education level: Not on file  Social Needs  . Financial resource strain: Not on file  . Food insecurity - worry: Not on file  . Food insecurity - inability: Not on file  . Transportation needs - medical: Not on file  . Transportation needs - non-medical: Not on file  Occupational History  . Occupation: retired  Tobacco Use  . Smoking status: Former Smoker    Packs/day: 0.30    Years: 20.00    Pack years: 6.00    Types: Cigarettes  . Smokeless tobacco: Never Used  Substance and Sexual Activity  . Alcohol use: No  . Drug use: No  . Sexual activity: No  Other Topics Concern  . Not on file  Social History Narrative  . Not on file     Review of Systems    General:  No chills, fever, night sweats or weight changes.  Cardiovascular:  No chest pain, +++ dyspnea on exertion, no edema, orthopnea, palpitations, paroxysmal nocturnal dyspnea. Dermatological: No rash, lesions/masses Respiratory: No cough, +++ dyspnea, +++ wheezing. Urologic: No hematuria, dysuria Abdominal:   No nausea, vomiting, diarrhea, bright red blood per rectum, melena, or hematemesis Neurologic:  No visual changes, wkns, changes in mental status. All other systems reviewed and are otherwise negative except as noted  above.  Physical Exam    Blood pressure 136/88, pulse (!) 104, temperature 98.3 F (36.8 C), temperature source Oral, resp. rate 18, weight 170 lb (77.1 kg), SpO2 100 %.  General: Pleasant, NAD Psych: Normal affect. Neuro: Alert and oriented X 3. Moves all extremities spontaneously. HEENT: Normal  Neck: Supple without bruits.  Obese, difficult to gauge JVP. Lungs:  Resp regular and unlabored, CTA posteriorly, no wheezing, rhonchi noted anteriorly. Heart: RRR no  s3, s4, or murmurs. Abdomen: Obese, soft, non-tender, non-distended, BS + x 4.  Extremities: No clubbing, cyanosis or edema. DP/PT/Radials 2+ and equal bilaterally.  Labs     Recent Labs    11/11/17 0107  TROPONINI 0.05*   Lab Results  Component Value Date   WBC 10.7 11/11/2017   HGB 10.0 (L) 11/11/2017   HCT 31.0 (L) 11/11/2017   MCV 87.5 11/11/2017   PLT 296 11/11/2017    Recent Labs  Lab 11/11/17 0107  NA 143  K 4.3  CL 110  CO2 21*  BUN 47*  CREATININE 1.74*  CALCIUM 8.9  GLUCOSE 180*    Radiology Studies    Dg Chest Portable 1 View  Result Date: 11/11/2017 CLINICAL DATA:  Shortness of breath. EXAM: PORTABLE CHEST 1 VIEW COMPARISON:  08/24/2017 FINDINGS: Unchanged cardiomegaly and mediastinal contours. Worsening in pulmonary vascular congestion. Minimal central peribronchial cuffing may be mild pulmonary edema. No confluent airspace disease. No large pleural effusion. No pneumothorax. IMPRESSION: Increase in vascular congestion, possible mild central pulmonary edema. Unchanged cardiomegaly from prior exams. Electronically Signed   By: Jeb Levering M.D.   On: 11/11/2017 01:24    ECG & Cardiac Imaging    Sinus tachycardia, 120, PVC, rightward axis, LAE, IVCD. No acute changes.  Assessment & Plan    1.  Acute on chronic resp failure:  Likely multifactorial in setting of h/o asthma and combined CHF.  She had been doing well @ home w/o edema or weight change but developed wheezing prior to bed last  night that seemed to subside with neb.  She awoke later in the evening with worsening dyspnea and wheezing and called EMS.  In the ED, CXR showed edema and she was treated with CPAP, lasix, and nebs.  She feels much better this AM.  Not currently wheezing, though rhonchi noted anteriorly.  Volume status difficult to gauge due to body habitus.  Cont inhalers/nebs.  F/u creat this AM  can likely transition to home dose of PO torsemide (20 daily).  2.  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  Volume appears relatively stable, though exam somewhat challenging due to body habitus.  F/u creat this AM.  She already received her AM dose of lasix.  Can prob switch back to torsemide 20 daily to start tomorrow.  HR/BP stable.  Cont  blocker and spiro.  No acei/arb/arni 2/2 CKD III.  3.  Asthma: See #1.  4.  CKD III:  Creat up on admission compared to Nov.  F/u in setting of diuresis.  5.  Elevated trop:  In setting of #1/2.  No chest pain.  Prev nl cors on cath in 02/2017.  F/u trop pending.  Doubt ACS.  6.  OSA:  Compliant w/ CPAP @ home.  7.  HL:  On statin in setting of DM.  We don't have any lipids on file.  Will need to be followed as outpt.  8.  DMII:  Per IM.  Signed, Murray Hodgkins, NP 11/11/2017, 10:15 AM  For questions or updates, please contact   Please consult [email or web address] for contact info under Cardiology/STEMI.

## 2017-11-11 NOTE — ED Provider Notes (Signed)
[facility] Emergency Department Provider Note   ____________________________________________   I have reviewed the triage vital signs and the nursing notes.   HISTORY  Chief Complaint Respiratory Distress   History limited by: Not Limited   HPI Angela Pham is a 77 y.o. female who presents to the emergency department today because of concerns for respiratory distress.  Patient states that it started yesterday.  Progressively got worse.  Tonight before she went to bed she took her breathing treatments and was able to go to sleep.  However she shortly woke up with increasing respiratory distress.  Patient does have CPAP at home and when EMS arrived they placed patient on their CPAP device.  Patient denies any chest pain.  States she has a history of CHF and asthma.   Per medical record review patient has a history of CHF, asthma.   Past Medical History:  Diagnosis Date  . Asthma   . Chronic combined systolic and diastolic CHF (congestive heart failure) ([place])    a. TTE 02/2017: EF 45-50%, basal amd midanteroseptal HK b. 05/2017: echo showing EF of 35-40%, mild AI, moderate MR, and mildly dilated LA.   Marland Kitchen Coronary artery disease, non-occlusive    a. LHC 02/2017 normal coronary arteries  . Hyperlipidemia   . Hypertension   . Morbid obesity ([place])   . Pulmonary hypertension ([place])   . Type II diabetes mellitus [facility])     Patient Active Problem List   Diagnosis Date Noted  . Anemia 09/26/2017  . Monoclonal gammopathy 09/26/2017  . Acute respiratory failure ([place]) 08/24/2017  . Acute URI 08/20/2017  . Acute respiratory failure with hypoxia ([place]) 08/03/2017  . DNR (do not resuscitate) 07/02/2017  . Respiratory failure ([place]) 06/27/2017  . Chronic diastolic heart failure ([place]) 06/24/2017  . Asthma 06/24/2017  . Diabetes ([place]) 06/24/2017  . Essential hypertension 06/23/2017  . Hyperlipidemia, unspecified 06/23/2017  . CKD (chronic kidney disease) stage 3, GFR  30-59 ml/min ([facility]) 06/23/2017  . Pressure injury of skin 06/12/2017  . COPD exacerbation ([place]) 05/28/2017  . SOB (shortness of breath)   . Chronic combined systolic and diastolic heart failure ([place]) 03/07/2017    Past Surgical History:  Procedure Laterality Date  . c-section    . RIGHT/LEFT HEART CATH AND CORONARY ANGIOGRAPHY N/A 03/09/2017   Procedure: Right/Left Heart Cath and Coronary Angiography;  Surgeon: Minna Merritts, MD;  Location: [facility];  Service: Cardiovascular;  Laterality: N/A;    Prior to Admission medications   Medication Sig Start Date End Date Taking? Authorizing Provider  arformoterol (BROVANA) 15 MCG/2ML NEBU Take 2 mLs (15 mcg total) 2 (two) times daily by nebulization. DX: COPD J44.9 08/08/17  Yes Wilhelmina Mcardle, MD  aspirin EC 81 MG EC tablet Take 1 tablet (81 mg total) by mouth daily. 03/12/17  Yes Mody, Sital, MD  budesonide (PULMICORT) 0.25 MG/2ML nebulizer solution Take 2 mLs (0.25 mg total) by nebulization 2 (two) times daily. DX: COPD J44.9 10/06/17  Yes Wilhelmina Mcardle, MD  carvedilol (COREG) 6.25 MG tablet Take 1 tablet (6.25 mg total) by mouth 2 (two) times daily. Patient taking differently: Take 6.25 mg by mouth daily.  06/23/17  Yes Strader, [place] M, PA-C  mirtazapine (REMERON) 45 MG tablet Take 1 tablet by mouth at bedtime.   Yes [provider]  Multiple Vitamins-Minerals (EYE VITAMINS PO) Take 1 tablet by mouth 2 (two) times daily.    Yes [provider]  salmeterol (SEREVENT DISKUS)  50 MCG/DOSE diskus inhaler Inhale 1 puff into the lungs 2 (two) times daily. 10/07/17 10/07/18 Yes Wilhelmina Mcardle, MD  senna-docusate (SENOKOT-S) 8.6-50 MG tablet Take 2 tablets by mouth 2 (two) times daily. 07/04/17  Yes Sudini, Alveta Heimlich, MD  spironolactone (ALDACTONE) 25 MG tablet Take 0.5 tablets (12.5 mg total) by mouth daily. 06/23/17  Yes Strader, [place], PA-C  tiotropium (SPIRIVA HANDIHALER) 18 MCG inhalation capsule Place 1  capsule (18 mcg total) into inhaler and inhale daily. 07/04/17 07/04/18 Yes Sudini, Alveta Heimlich, MD  torsemide (DEMADEX) 20 MG tablet Take one tablet twicw a day for 1 week and then change it to daily. May take one extra tablet daily for weight gain >3lbs overnight or >5lbs in a week. Patient taking differently: 20 mg 2 (two) times daily.  08/23/17  Yes Fritzi Mandes, MD  ALPRAZolam Duanne Moron) 0.25 MG tablet Take 1 tablet (0.25 mg total) by mouth 3 (three) times daily as needed for anxiety or sleep. Patient not taking: Reported on 11/11/2017 08/26/17   Demetrios Loll, MD  atorvastatin (LIPITOR) 40 MG tablet Take 40 mg by mouth daily.    [provider]  Ipratropium-Albuterol (COMBIVENT IN) Inhale 3 puffs into the lungs every 4 (four) hours as needed.    [provider]  levocetirizine (XYZAL) 5 MG tablet Take 10 mg by mouth daily. 03/18/17   [provider]  metFORMIN (GLUCOPHAGE) 850 MG tablet Take 1 tablet by mouth 3 (three) times daily.     [provider]  montelukast (SINGULAIR) 10 MG tablet Take 1 tablet (10 mg total) by mouth at bedtime. Patient not taking: Reported on 11/11/2017 03/11/17   [name], MD  potassium chloride SA (K-DUR,KLOR-CON) 20 MEQ tablet Take 1 tablet (20 mEq total) by mouth daily. 08/23/17   Fritzi Mandes, MD  predniSONE (DELTASONE) 10 MG tablet 30 mg po daily for 1 day, 20 mg po daily for 1 day, 10 mg po daily for 1 day. Patient not taking: Reported on 11/11/2017 08/26/17   Demetrios Loll, MD    Allergies Ace inhibitors and Shellfish allergy  Family History  Problem Relation Age of Onset  . CAD Father   . Colon cancer Sister   . Leukemia Brother   . Throat cancer Brother   . Cervical cancer Sister     Social History Social History   Tobacco Use  . Smoking status: Former Smoker    Packs/day: 0.30    Years: 20.00    Pack years: 6.00    Types: Cigarettes  . Smokeless tobacco: Never Used  Substance Use Topics  . Alcohol use: No  . Drug use:  No    Review of Systems Constitutional: No fever/chills Eyes: No visual changes. ENT: No sore throat. Cardiovascular: Denies chest pain. Respiratory: Positive shortness of breath. Gastrointestinal: No abdominal pain.  No nausea, no vomiting.  No diarrhea.   Genitourinary: Negative for dysuria. Musculoskeletal: Negative for back pain. Skin: Negative for rash. Neurological: Negative for headaches, focal weakness or numbness.  ____________________________________________   PHYSICAL EXAM:  VITAL SIGNS: ED Triage Vitals  Enc Vitals Group     BP 11/11/17 0104 (!) 155/73     Pulse Rate 11/11/17 0104 (!) 113     Resp 11/11/17 0104 (!) 24     Temp 11/11/17 0104 98.4 F (36.9 C)     Temp Source 11/11/17 0104 Oral     SpO2 11/11/17 0100 100 %     Weight 11/11/17 0104 170 lb (77.1 kg)  Constitutional: Alert and oriented. Mild respiratory distress. Tachypnea.  Eyes: Conjunctivae are normal.  ENT   Head: Normocephalic and atraumatic.   Nose: No congestion/rhinnorhea.   Mouth/Throat: Mucous membranes are moist.   Neck: No stridor. Hematological/Lymphatic/Immunilogical: No cervical lymphadenopathy. Cardiovascular: Tachycardic, regular rhythm.  No murmurs, rubs, or gallops.  Respiratory: Increased respiratory effort and rate. Diminished breath sounds diffusely. Gastrointestinal: Soft and non tender. No rebound. No guarding.  Genitourinary: Deferred Musculoskeletal: Normal range of motion in all extremities. Mild lower extremity edema.  Neurologic:  Normal speech and language. No gross focal neurologic deficits are appreciated.  Skin:  Skin is warm, dry and intact. No rash noted. Psychiatric: Mood and affect are normal. Speech and behavior are normal. Patient exhibits appropriate insight and judgment.  ____________________________________________    LABS (pertinent positives/negatives)  Influenza negative Trop 0.05 Cbc wbc 12.6, hgb 11.0, plt 341 BMP na 143, k 4.3,  glu 180, cr 1.74  ____________________________________________   EKG  I, Nance Pear, attending physician, personally viewed and interpreted this EKG  EKG Time: 0101 Rate: 120 Rhythm: sinus tachycardia Axis: right axis deviation Intervals: qtc 506 QRS: nonspecific ivcd, q waves v1, v2 ST changes: no st elevation Impression: abnormal ekg  ____________________________________________    RADIOLOGY  CXR No pneumonia, some edema.  ____________________________________________   PROCEDURES  Procedures  CRITICAL CARE Performed by: Nance Pear   Total critical care time: 30 minutes  Critical care time was exclusive of separately billable procedures and treating other patients.  Critical care was necessary to treat or prevent imminent or life-threatening deterioration.  Critical care was time spent personally by me on the following activities: development of treatment plan with patient and/or surrogate as well as nursing, discussions with consultants, evaluation of patient's response to treatment, examination of patient, obtaining history from patient or surrogate, ordering and performing treatments and interventions, ordering and review of laboratory studies, ordering and review of radiographic studies, pulse oximetry and re-evaluation of patient's condition.  ____________________________________________   INITIAL IMPRESSION / ASSESSMENT AND PLAN / ED COURSE  Pertinent labs & imaging results that were available during my care of the patient were reviewed by me and considered in my medical decision making (see chart for details).  Patient presented to the emergency department today with respiratory distress and on CPAP by EMS.  Patient has a history of both CHF and asthma and on initial exam patient was in mild respiratory distress with diminished breath sounds diffusely.  Mild lower extremity edema.  Patient was placed on BiPAP and appeared more comfortable.  She  was given both Lasix and breathing treatments here in the emergency department.  She was able to be weaned off of her BiPAP.  Chest x-ray was obtained to evaluate for pneumonia or less likely pneumothorax causing shortness of breath.  This did not show any obvious signs of pneumonia blood work without leukocytosis.  Will plan on admitting to the hospital service.  Discussed with patient.   ____________________________________________   FINAL CLINICAL IMPRESSION(S) / ED DIAGNOSES  Final diagnoses:  Acute on chronic congestive heart failure, unspecified heart failure type ([place])  Respiratory distress  Shortness of breath     Note: This dictation was prepared with Dragon dictation. Any transcriptional errors that result from this process are unintentional     Nance Pear, MD 11/11/17 [phone]

## 2017-11-11 NOTE — Progress Notes (Signed)
[place] [facility]                                                                                                                                                                                  Patient Demographics   Angela Pham, is a 77 y.o. female, DOB - Oct 08, 1940, DQQ:[id number]  Admit date - 11/11/2017   Admitting Physician Saundra Shelling, MD  Outpatient Primary MD for the patient is Cletis Athens, MD   LOS - 0  Subjective: She is admitted with recurrent CHF. Breathing is improved    Review of Systems:   CONSTITUTIONAL: No documented fever. No fatigue, weakness. No weight gain, no weight loss.  EYES: No blurry or double vision.  ENT: No tinnitus. No postnasal drip. No redness of the oropharynx.  RESPIRATORY: No cough, no wheeze, no hemoptysis. Positive dyspnea.  CARDIOVASCULAR: No chest pain. No orthopnea. No palpitations. No syncope.  GASTROINTESTINAL: No nausea, no vomiting or diarrhea. No abdominal pain. No melena or hematochezia.  GENITOURINARY: No dysuria or hematuria.  ENDOCRINE: No polyuria or nocturia. No heat or cold intolerance.  HEMATOLOGY: No anemia. No bruising. No bleeding.  INTEGUMENTARY: No rashes. No lesions.  MUSCULOSKELETAL: No arthritis. No swelling. No gout.  NEUROLOGIC: No numbness, tingling, or ataxia. No seizure-type activity.  PSYCHIATRIC: No anxiety. No insomnia. No ADD.    Vitals:   Vitals:   11/11/17 0904 11/11/17 1042 11/11/17 1044 11/11/17 1300  BP: 136/88     Pulse: (!) 104     Resp:      Temp: 98.3 F (36.8 C)     TempSrc: Oral     SpO2: 100% 100% 100%   Weight:      Height:    4\' 11"  (1.499 m)    Wt Readings from Last 3 Encounters:  11/11/17 170 lb (77.1 kg)  09/16/17 170 lb (77.1 kg)  08/26/17 175 lb 3.2 oz (79.5 kg)     Intake/Output Summary (Last 24 hours) at 11/11/2017 1441 Last data filed at 11/11/2017 1411 Gross per 24 hour  Intake 360 ml  Output -  Net 360 ml    Physical Exam:    GENERAL: Pleasant-appearing in no apparent distress.  HEAD, EYES, EARS, NOSE AND THROAT: Atraumatic, normocephalic. Extraocular muscles are intact. Pupils equal and reactive to light. Sclerae anicteric. No conjunctival injection. No oro-pharyngeal erythema.  NECK: Supple. There is no jugular venous distention. No bruits, no lymphadenopathy, no thyromegaly.  HEART: Regular rate and rhythm,. No murmurs, no rubs, no clicks.  LUNGS: Bilateral crackles throughout both lungs without any associated muscle usage  ABDOMEN: Soft, flat, nontender, nondistended. Has good bowel sounds. No hepatosplenomegaly appreciated.  EXTREMITIES: No evidence of any cyanosis, clubbing, or peripheral edema.  +2 pedal and radial pulses bilaterally.  NEUROLOGIC: The patient is alert, awake, and oriented x3 with no focal motor or sensory deficits appreciated bilaterally.  SKIN: Moist and warm with no rashes appreciated.  Psych: Not anxious, depressed LN: No inguinal LN enlargement    Antibiotics   Anti-infectives (From admission, onward)   None      Medications   Scheduled Meds: . arformoterol  15 mcg Nebulization BID  . aspirin EC  81 mg Oral Daily  . atorvastatin  40 mg Oral Daily  . budesonide  0.25 mg Nebulization BID  . carvedilol  6.25 mg Oral BID  . enoxaparin (LOVENOX) injection  30 mg Subcutaneous Q24H  . furosemide  40 mg Intravenous BID  . insulin aspart  0-15 Units Subcutaneous TID WC  . insulin aspart  0-5 Units Subcutaneous QHS  . ipratropium-albuterol  3 mL Nebulization BID  . ketotifen  1 drop Both Eyes BID  . loratadine  10 mg Oral Daily  . metFORMIN  850 mg Oral TID WC  . mirtazapine  45 mg Oral QHS  . montelukast  10 mg Oral QHS  . multivitamin-lutein  1 capsule Oral BID  . senna-docusate  1 tablet Oral q morning - 10a  . senna-docusate  2 tablet Oral QHS  . sodium chloride flush  3 mL Intravenous Q12H  . spironolactone  12.5 mg Oral Daily  . tiotropium  18 mcg Inhalation Daily    Continuous Infusions: . sodium chloride     PRN Meds:.sodium chloride, acetaminophen, ALPRAZolam, ipratropium-albuterol, ondansetron (ZOFRAN) IV, sodium chloride flush   Data Review:   Micro Results No results found for this or any previous visit (from the past 240 hour(s)).  Radiology Reports Dg Chest Portable 1 View  Result Date: 11/11/2017 CLINICAL DATA:  Shortness of breath. EXAM: PORTABLE CHEST 1 VIEW COMPARISON:  08/24/2017 FINDINGS: Unchanged cardiomegaly and mediastinal contours. Worsening in pulmonary vascular congestion. Minimal central peribronchial cuffing may be mild pulmonary edema. No confluent airspace disease. No large pleural effusion. No pneumothorax. IMPRESSION: Increase in vascular congestion, possible mild central pulmonary edema. Unchanged cardiomegaly from prior exams. Electronically Signed   By: Jeb Levering M.D.   On: 11/11/2017 01:24     CBC Recent Labs  Lab 11/11/17 0107 11/11/17 0638  WBC 12.6* 10.7  HGB 11.0* 10.0*  HCT 34.0* 31.0*  PLT 341 296  MCV 88.3 87.5  MCH 28.5 28.1  MCHC 32.3 32.1  RDW 19.1* 19.2*    Chemistries  Recent Labs  Lab 11/11/17 0107 11/11/17 0638  NA 143  --   K 4.3  --   CL 110  --   CO2 21*  --   GLUCOSE 180*  --   BUN 47*  --   CREATININE 1.74* 1.71*  CALCIUM 8.9  --    ------------------------------------------------------------------------------------------------------------------ estimated creatinine clearance is 25.9 mL/min (A) (by C-G formula based on SCr of 1.71 mg/dL (H)). ------------------------------------------------------------------------------------------------------------------ No results for input(s): HGBA1C in the last 72 hours. ------------------------------------------------------------------------------------------------------------------ No results for input(s): CHOL, HDL, LDLCALC, TRIG, CHOLHDL, LDLDIRECT in the last 72  hours. ------------------------------------------------------------------------------------------------------------------ No results for input(s): TSH, T4TOTAL, T3FREE, THYROIDAB in the last 72 hours.  Invalid input(s): FREET3 ------------------------------------------------------------------------------------------------------------------ No results for input(s): VITAMINB12, FOLATE, FERRITIN, TIBC, IRON, RETICCTPCT in the last 72 hours.  Coagulation profile No results for input(s): INR, PROTIME in the last 168 hours.  No results for input(s): DDIMER in the last 72 hours.  Cardiac Enzymes Recent Labs  Lab 11/11/17 0107 11/11/17 0638 11/11/17 1215  TROPONINI 0.05* 0.06* 0.05*   ------------------------------------------------------------------------------------------------------------------ Invalid input(s): POCBNP    Assessment & Plan   Patient is a 77 year old female with history of chronic systolic CHF presenting with worsening shortness of breath  1. Acute on chronic systolic CHF Continued therapy with IV Lasix Continuing Coreg Discontinued. Echo patient had one in September  2. Pulmonary hypertension  out patient's pulmonary follow-up Could be related to not meant compliance with CPAP  3. Sleep apnea resume CPAP  4. Asthma and bronchospasm continue Pulmicort nebs, use nebs when necessary Continue home inhalers  5. Diabetes type 2 continue sliding-scale insulin I have resume her metformin  6. Hyperlipidemia unspecified continue Lipitor     Code Status Orders  (From admission, onward)        Start     Ordered   11/11/17 0614  Do not attempt resuscitation (DNR)  Continuous    Question Answer Comment  In the event of cardiac or respiratory ARREST Do not call a "code blue"   In the event of cardiac or respiratory ARREST Do not perform Intubation, CPR, defibrillation or ACLS   In the event of cardiac or respiratory ARREST Use medication by any route, position,  wound care, and other measures to relive pain and suffering. May use oxygen, suction and manual treatment of airway obstruction as needed for comfort.   Comments RN may pronounce      11/11/17 0109    Code Status History    Date Active Date Inactive Code Status Order ID Comments User Context   08/24/2017 12:45 08/26/2017 19:07 DNR [id number]  Nicholes Mango, MD ED   08/20/2017 12:49 08/23/2017 21:42 DNR [id number]  Idelle Crouch, MD Inpatient   08/03/2017 14:09 08/06/2017 19:12 Full Code [id number]  Henreitta Leber, MD ED   06/27/2017 04:29 07/04/2017 16:59 DNR [id number]  Saundra Shelling, MD Inpatient   06/13/2017 04:25 06/16/2017 18:16 DNR [id number]  Mikael Spray, NP Inpatient   06/12/2017 03:28 06/13/2017 04:25 Full Code [id number]  Hugelmeyer, Ubaldo Glassing, DO Inpatient   06/12/2017 02:00 06/12/2017 03:28 DNR [id number]  Harvie Bridge, DO ED   05/29/2017 02:49 06/02/2017 21:23 DNR [id number]  Verlin Dike, RN Inpatient   05/28/2017 22:59 05/29/2017 02:49 Full Code [id number]  Harvie Bridge, DO Inpatient   05/04/2017 17:19 05/08/2017 17:18 DNR [id number]  Loletha Grayer, MD ED   03/09/2017 12:34 03/11/2017 22:08 Full Code [id number]  Minna Merritts, MD Inpatient   03/07/2017 05:12 03/09/2017 12:34 Full Code [id number]  Saundra Shelling, MD Inpatient    Advance Directive Documentation     Most Recent Value  Type of Advance Directive  Healthcare Power of Attorney  Pre-existing out of facility DNR order (yellow form or pink MOST form)  No data  "MOST" Form in Place?  No data           Consults  cardiology  DVT Prophylaxis Lovenox  Lab Results  Component Value Date   PLT 296 11/11/2017     Time Spent in minutes   45 minutes Greater than 50% of time spent in care coordination and counseling patient regarding the condition and plan of care.   Dustin Flock M.D on 11/11/2017 at 2:41 PM  Between 7am to 6pm - Pager - [phone]  After 6pm go to [email or web address] - password [email or web address]  [facility]   Office  [phone]

## 2017-11-11 NOTE — Plan of Care (Signed)
Pt is A&Ox4. VSS . 2L O2 [place] . NSR on monitor. Pt OOB with standby assist.  No complaints thus far. Will continue to monitor and report to oncoming RN .  Progressing Education: Knowledge of General Education information will improve 11/11/2017 1355 - Progressing by Aleen Campi, RN Health Behavior/Discharge Planning: Ability to manage health-related needs will improve 11/11/2017 1355 - Progressing by Aleen Campi, RN Clinical Measurements: Ability to maintain clinical measurements within normal limits will improve 11/11/2017 1355 - Progressing by Aleen Campi, RN Will remain free from infection 11/11/2017 1355 - Progressing by Aleen Campi, RN Diagnostic test results will improve 11/11/2017 1355 - Progressing by Aleen Campi, RN Respiratory complications will improve 11/11/2017 1355 - Progressing by Aleen Campi, RN Cardiovascular complication will be avoided 11/11/2017 1355 - Progressing by Aleen Campi, RN Activity: Risk for activity intolerance will decrease 11/11/2017 1355 - Progressing by Aleen Campi, RN Nutrition: Adequate nutrition will be maintained 11/11/2017 1355 - Progressing by Aleen Campi, RN Coping: Level of anxiety will decrease 11/11/2017 1355 - Progressing by Aleen Campi, RN Elimination: Will not experience complications related to bowel motility 11/11/2017 1355 - Progressing by Aleen Campi, RN Will not experience complications related to urinary retention 11/11/2017 1355 - Progressing by Aleen Campi, RN Pain Managment: General experience of comfort will improve 11/11/2017 1355 - Progressing by Aleen Campi, RN Safety: Ability to remain free from injury will improve 11/11/2017 1355 - Progressing by Aleen Campi, RN Skin Integrity: Risk for impaired skin integrity will decrease 11/11/2017 1355 - Progressing by Aleen Campi, RN Education: Knowledge of General Education information will improve 11/11/2017 1355 - Progressing by Aleen Campi, RN Health  Behavior/Discharge Planning: Ability to manage health-related needs will improve 11/11/2017 1355 - Progressing by Aleen Campi, RN Clinical Measurements: Ability to maintain clinical measurements within normal limits will improve 11/11/2017 1355 - Progressing by Aleen Campi, RN Will remain free from infection 11/11/2017 1355 - Progressing by Aleen Campi, RN Diagnostic test results will improve 11/11/2017 1355 - Progressing by Aleen Campi, RN Respiratory complications will improve 11/11/2017 1355 - Progressing by Aleen Campi, RN Cardiovascular complication will be avoided 11/11/2017 1355 - Progressing by Aleen Campi, RN Activity: Risk for activity intolerance will decrease 11/11/2017 1355 - Progressing by Aleen Campi, RN Nutrition: Adequate nutrition will be maintained 11/11/2017 1355 - Progressing by Aleen Campi, RN Coping: Level of anxiety will decrease 11/11/2017 1355 - Progressing by Aleen Campi, RN Elimination: Will not experience complications related to bowel motility 11/11/2017 1355 - Progressing by Aleen Campi, RN Will not experience complications related to urinary retention 11/11/2017 1355 - Progressing by Aleen Campi, RN Pain Managment: General experience of comfort will improve 11/11/2017 1355 - Progressing by Aleen Campi, RN Safety: Ability to remain free from injury will improve 11/11/2017 1355 - Progressing by Aleen Campi, RN Skin Integrity: Risk for impaired skin integrity will decrease 11/11/2017 1355 - Progressing by Aleen Campi, RN

## 2017-11-11 NOTE — H&P (Signed)
[facility] [place] NAME: Angela Pham    MR#:  [id number]  DATE OF BIRTH:  1941/07/30  DATE OF ADMISSION:  11/11/2017  PRIMARY CARE PHYSICIAN: Cletis Athens, MD   REQUESTING/REFERRING PHYSICIAN:   CHIEF COMPLAINT:   Chief Complaint  Patient presents with  . Respiratory Distress    HISTORY OF PRESENT ILLNESS: Angela Pham  is a 77 y.o. female with a known history of combined systolic diastolic heart failure, hyperlipidemia, hypertension, pulmonary hypertension, diabetes mellitus type 2 presented to the emergency room with increased shortness of breath for the last 2 days.  Patient was initially put on BiPAP and she came to the emergency room and once her breathing improved she was weaned to oxygen via nasal cannula.  Patient has orthopnea.  She was evaluated with chest x-ray which showed pulmonary edema and fluid overload.  Patient was given IV Lasix for diuresis in the emergency room.  No complaints of any chest pain.  Hospitalist service was consulted.  PAST MEDICAL HISTORY:   Past Medical History:  Diagnosis Date  . Asthma   . Chronic combined systolic and diastolic CHF (congestive heart failure) ([place])    a. TTE 02/2017: EF 45-50%, basal amd midanteroseptal HK b. 05/2017: echo showing EF of 35-40%, mild AI, moderate MR, and mildly dilated LA.   [facility] Kitchen Coronary artery disease, non-occlusive    a. LHC 02/2017 normal coronary arteries  . Hyperlipidemia   . Hypertension   . Morbid obesity ([place])   . Pulmonary hypertension ([place])   . Type II diabetes mellitus ([place])     PAST SURGICAL HISTORY:  Past Surgical History:  Procedure Laterality Date  . c-section    . RIGHT/LEFT HEART CATH AND CORONARY ANGIOGRAPHY N/A 03/09/2017   Procedure: Right/Left Heart Cath and Coronary Angiography;  Surgeon: Minna Merritts, MD;  Location: [facility];  Service: Cardiovascular;  Laterality: N/A;    SOCIAL HISTORY:  Social History   Tobacco  Use  . Smoking status: Former Smoker    Packs/day: 0.30    Years: 20.00    Pack years: 6.00    Types: Cigarettes  . Smokeless tobacco: Never Used  Substance Use Topics  . Alcohol use: No    FAMILY HISTORY:  Family History  Problem Relation Age of Onset  . CAD Father   . Colon cancer Sister   . Leukemia Brother   . Throat cancer Brother   . Cervical cancer Sister     DRUG ALLERGIES:  Allergies  Allergen Reactions  . Ace Inhibitors Anaphylaxis    Tongue swelling  . Shellfish Allergy Hives and Swelling    Swelling around face and mouth     REVIEW OF SYSTEMS:   CONSTITUTIONAL: No fever, fatigue or weakness.  EYES: No blurred or double vision.  EARS, NOSE, AND THROAT: No tinnitus or ear pain.  RESPIRATORY: Has cough, shortness of breath, no wheezing or hemoptysis.  CARDIOVASCULAR: No chest pain, has orthopnea,  No edema.  GASTROINTESTINAL: No nausea, vomiting, diarrhea or abdominal pain.  GENITOURINARY: No dysuria, hematuria.  ENDOCRINE: No polyuria, nocturia,  HEMATOLOGY: No anemia, easy bruising or bleeding SKIN: No rash or lesion. MUSCULOSKELETAL: No joint pain or arthritis.   NEUROLOGIC: No tingling, numbness, weakness.  PSYCHIATRY: No anxiety or depression.   MEDICATIONS AT HOME:  Prior to Admission medications   Medication Sig Start Date End Date Taking? Authorizing Provider  arformoterol (BROVANA) 15 MCG/2ML NEBU Take 2 mLs (15 mcg  total) 2 (two) times daily by nebulization. DX: COPD J44.9 08/08/17  Yes Wilhelmina Mcardle, MD  aspirin EC 81 MG EC tablet Take 1 tablet (81 mg total) by mouth daily. 03/12/17  Yes Mody, Sital, MD  budesonide (PULMICORT) 0.25 MG/2ML nebulizer solution Take 2 mLs (0.25 mg total) by nebulization 2 (two) times daily. DX: COPD J44.9 10/06/17  Yes Wilhelmina Mcardle, MD  carvedilol (COREG) 6.25 MG tablet Take 1 tablet (6.25 mg total) by mouth 2 (two) times daily. Patient taking differently: Take 6.25 mg by mouth daily.  06/23/17  Yes Strader,  [place] M, PA-C  mirtazapine (REMERON) 45 MG tablet Take 1 tablet by mouth at bedtime.   Yes [provider]  Multiple Vitamins-Minerals (EYE VITAMINS PO) Take 1 tablet by mouth 2 (two) times daily.    Yes [provider]  salmeterol (SEREVENT DISKUS) 50 MCG/DOSE diskus inhaler Inhale 1 puff into the lungs 2 (two) times daily. 10/07/17 10/07/18 Yes Wilhelmina Mcardle, MD  senna-docusate (SENOKOT-S) 8.6-50 MG tablet Take 2 tablets by mouth 2 (two) times daily. 07/04/17  Yes Sudini, Alveta Heimlich, MD  spironolactone (ALDACTONE) 25 MG tablet Take 0.5 tablets (12.5 mg total) by mouth daily. 06/23/17  Yes Strader, [place], PA-C  tiotropium (SPIRIVA HANDIHALER) 18 MCG inhalation capsule Place 1 capsule (18 mcg total) into inhaler and inhale daily. 07/04/17 07/04/18 Yes Sudini, Alveta Heimlich, MD  torsemide (DEMADEX) 20 MG tablet Take one tablet twicw a day for 1 week and then change it to daily. May take one extra tablet daily for weight gain >3lbs overnight or >5lbs in a week. Patient taking differently: 20 mg 2 (two) times daily.  08/23/17  Yes Fritzi Mandes, MD  ALPRAZolam Duanne Moron) 0.25 MG tablet Take 1 tablet (0.25 mg total) by mouth 3 (three) times daily as needed for anxiety or sleep. Patient not taking: Reported on 11/11/2017 08/26/17   Demetrios Loll, MD  atorvastatin (LIPITOR) 40 MG tablet Take 40 mg by mouth daily.    [provider]  Ipratropium-Albuterol (COMBIVENT IN) Inhale 3 puffs into the lungs every 4 (four) hours as needed.    [provider]  levocetirizine (XYZAL) 5 MG tablet Take 10 mg by mouth daily. 03/18/17   [provider]  metFORMIN (GLUCOPHAGE) 850 MG tablet Take 1 tablet by mouth 3 (three) times daily.     [provider]  montelukast (SINGULAIR) 10 MG tablet Take 1 tablet (10 mg total) by mouth at bedtime. Patient not taking: Reported on 11/11/2017 03/11/17   [name], MD  potassium chloride SA (K-DUR,KLOR-CON) 20 MEQ tablet Take 1 tablet (20 mEq  total) by mouth daily. 08/23/17   Fritzi Mandes, MD  predniSONE (DELTASONE) 10 MG tablet 30 mg po daily for 1 day, 20 mg po daily for 1 day, 10 mg po daily for 1 day. Patient not taking: Reported on 11/11/2017 08/26/17   Demetrios Loll, MD      PHYSICAL EXAMINATION:   VITAL SIGNS: Blood pressure 125/68, pulse 95, temperature 98.4 F (36.9 C), temperature source Oral, resp. rate 19, weight 77.1 kg (170 lb), SpO2 98 %.  GENERAL:  77 y.o.-year-old patient lying in the bed on oxygen via nasal canula.  EYES: Pupils equal, round, reactive to light and accommodation. No scleral icterus. Extraocular muscles intact.  HEENT: Head atraumatic, normocephalic. Oropharynx and nasopharynx clear.  NECK:  Supple, no jugular venous distention. No thyroid enlargement, no tenderness.  LUNGS: Decreased breath sounds bilaterally, bibasilar crepitations heard. No use of accessory muscles  of respiration.  CARDIOVASCULAR: S1, S2 normal. No murmurs, rubs, or gallops.  ABDOMEN: Soft, nontender, nondistended. Bowel sounds present. No organomegaly or mass.  EXTREMITIES: Trace pedal edema, no cyanosis, or clubbing.  NEUROLOGIC: Cranial nerves II through XII are intact. Muscle strength 5/5 in all extremities. Sensation intact. Gait not checked.  PSYCHIATRIC: The patient is alert and oriented x 3.  SKIN: No obvious rash, lesion, or ulcer.   LABORATORY PANEL:   CBC Recent Labs  Lab 11/11/17 0107  WBC 12.6*  HGB 11.0*  HCT 34.0*  PLT 341  MCV 88.3  MCH 28.5  MCHC 32.3  RDW 19.1*   ------------------------------------------------------------------------------------------------------------------  Chemistries  Recent Labs  Lab 11/11/17 0107  NA 143  K 4.3  CL 110  CO2 21*  GLUCOSE 180*  BUN 47*  CREATININE 1.74*  CALCIUM 8.9   ------------------------------------------------------------------------------------------------------------------ estimated creatinine clearance is 25.4 mL/min (A) (by C-G formula  based on SCr of 1.74 mg/dL (H)). ------------------------------------------------------------------------------------------------------------------ No results for input(s): TSH, T4TOTAL, T3FREE, THYROIDAB in the last 72 hours.  Invalid input(s): FREET3   Coagulation profile No results for input(s): INR, PROTIME in the last 168 hours. ------------------------------------------------------------------------------------------------------------------- No results for input(s): DDIMER in the last 72 hours. -------------------------------------------------------------------------------------------------------------------  Cardiac Enzymes Recent Labs  Lab 11/11/17 0107  TROPONINI 0.05*   ------------------------------------------------------------------------------------------------------------------ Invalid input(s): POCBNP  ---------------------------------------------------------------------------------------------------------------  Urinalysis    Component Value Date/Time   COLORURINE STRAW (A) 08/24/2017 0815   APPEARANCEUR CLEAR (A) 08/24/2017 0815   LABSPEC 1.005 08/24/2017 0815   PHURINE 5.0 08/24/2017 0815   GLUCOSEU NEGATIVE 08/24/2017 0815   HGBUR NEGATIVE 08/24/2017 0815   BILIRUBINUR NEGATIVE 08/24/2017 0815   KETONESUR NEGATIVE 08/24/2017 0815   PROTEINUR 30 (A) 08/24/2017 0815   NITRITE NEGATIVE 08/24/2017 0815   LEUKOCYTESUR NEGATIVE 08/24/2017 0815     RADIOLOGY: Dg Chest Portable 1 View  Result Date: 11/11/2017 CLINICAL DATA:  Shortness of breath. EXAM: PORTABLE CHEST 1 VIEW COMPARISON:  08/24/2017 FINDINGS: Unchanged cardiomegaly and mediastinal contours. Worsening in pulmonary vascular congestion. Minimal central peribronchial cuffing may be mild pulmonary edema. No confluent airspace disease. No large pleural effusion. No pneumothorax. IMPRESSION: Increase in vascular congestion, possible mild central pulmonary edema. Unchanged cardiomegaly from prior exams.  Electronically Signed   By: Jeb Levering M.D.   On: 11/11/2017 01:24    EKG: Orders placed or performed during the hospital encounter of 11/11/17  . EKG 12-Lead  . EKG 12-Lead  . ED EKG within 10 minutes  . ED EKG within 10 minutes    IMPRESSION AND PLAN: 77 year old female patient with history of combined systolic and diastolic heart failure, hypertension, hyperlipidemia, diabetes mellitus presented to the emergency room with increased shortness of breath.  Admitting diagnosis 1.  Acute on chronic congestive heart failure exacerbation 2.  Fluid overload 3.  Type 2 diabetes mellitus 4.  Hyperlipidemia 5 hypertension. Treatment plan Admit patient to medical floor Diurese patient with IV Lasix Oxygen via nasal cannula Cycle troponin and echocardiogram Resume cardiac medications Cardiology consultation  All the records are reviewed and case discussed with ED provider. Management plans discussed with the patient, family and they are in agreement.  CODE STATUS:FULL CODE Code Status History    Date Active Date Inactive Code Status Order ID Comments User Context   08/24/2017 12:45 08/26/2017 19:07 DNR [id number]  Nicholes Mango, MD ED   08/20/2017 12:49 08/23/2017 21:42 DNR [id number]  Idelle Crouch, MD Inpatient   08/03/2017 14:09 08/06/2017 19:12 Full Code [id number]  Henreitta Leber,  MD ED   06/27/2017 04:29 07/04/2017 16:59 DNR [id number]  Saundra Shelling, MD Inpatient   06/13/2017 04:25 06/16/2017 18:16 DNR [id number]  Mikael Spray, NP Inpatient   06/12/2017 03:28 06/13/2017 04:25 Full Code [id number]  Harvie Bridge, DO Inpatient   06/12/2017 02:00 06/12/2017 03:28 DNR [id number]  Harvie Bridge, DO ED   05/29/2017 02:49 06/02/2017 21:23 DNR [id number]  Verlin Dike, RN Inpatient   05/28/2017 22:59 05/29/2017 02:49 Full Code [id number]  Harvie Bridge, DO Inpatient   05/04/2017 17:19 05/08/2017 17:18 DNR [id number]  Loletha Grayer, MD ED   03/09/2017 12:34 03/11/2017  22:08 Full Code [id number]  Minna Merritts, MD Inpatient   03/07/2017 05:12 03/09/2017 12:34 Full Code [id number]  Saundra Shelling, MD Inpatient    Questions for Most Recent Historical Code Status (Order [id number])    Question Answer Comment   In the event of cardiac or respiratory ARREST Do not call a "code blue"    In the event of cardiac or respiratory ARREST Do not perform Intubation, CPR, defibrillation or ACLS    In the event of cardiac or respiratory ARREST Use medication by any route, position, wound care, and other measures to relive pain and suffering. May use oxygen, suction and manual treatment of airway obstruction as needed for comfort.    Comments RN may pronounce        TOTAL TIME TAKING CARE OF THIS PATIENT: 52 minutes.    Saundra Shelling M.D on 11/11/2017 at 5:36 AM  Between 7am to 6pm - Pager - [phone]  After 6pm go to [email or web address] - password [email or web address] [facility]  Office  [phone]  CC: Primary care physician; Cletis Athens, MD

## 2017-11-12 DIAGNOSIS — I272 Pulmonary hypertension, unspecified: Secondary | ICD-10-CM

## 2017-11-12 DIAGNOSIS — I5042 Chronic combined systolic (congestive) and diastolic (congestive) heart failure: Secondary | ICD-10-CM

## 2017-11-12 DIAGNOSIS — R748 Abnormal levels of other serum enzymes: Secondary | ICD-10-CM

## 2017-11-12 LAB — BASIC METABOLIC PANEL
Anion gap: 11 (ref 5–15)
BUN: 45 mg/dL — ABNORMAL HIGH (ref 6–20)
CHLORIDE: 107 mmol/L (ref 101–111)
CO2: 25 mmol/L (ref 22–32)
CREATININE: 1.67 mg/dL — AB (ref 0.44–1.00)
Calcium: 8.8 mg/dL — ABNORMAL LOW (ref 8.9–10.3)
GFR, EST AFRICAN AMERICAN: 34 mL/min — AB (ref >60)
GFR, EST NON AFRICAN AMERICAN: 29 mL/min — AB (ref >60)
Glucose, Bld: 100 mg/dL — ABNORMAL HIGH (ref 65–99)
POTASSIUM: 3.9 mmol/L (ref 3.5–5.1)
SODIUM: 143 mmol/L (ref 135–145)

## 2017-11-12 LAB — GLUCOSE, CAPILLARY: GLUCOSE-CAPILLARY: 116 mg/dL — AB (ref 65–99)

## 2017-11-12 MED ORDER — TORSEMIDE 20 MG PO TABS: 20.0000 mg | ORAL_TABLET | Freq: Two times a day (BID) | ORAL | Status: DC

## 2017-11-12 MED ORDER — CARVEDILOL 6.25 MG PO TABS: 6.2500 mg | ORAL_TABLET | Freq: Every day | ORAL | Status: AC

## 2017-11-12 NOTE — Discharge Summary (Signed)
[facility] [place] NAME: Angela Pham    MR#:  [id number]  DATE OF BIRTH:  02/23/1941  DATE OF ADMISSION:  11/11/2017 ADMITTING PHYSICIAN: Saundra Shelling, MD  DATE OF DISCHARGE: 11/12/2016  PRIMARY CARE PHYSICIAN: Cletis Athens, MD    ADMISSION DIAGNOSIS:  Shortness of breath [R06.02] Respiratory distress [R06.03] Acute on chronic congestive heart failure, unspecified heart failure type ([place]) [I50.9]  DISCHARGE DIAGNOSIS:  Active Problems:   CHF (congestive heart failure) ([place])   SECONDARY DIAGNOSIS:   Past Medical History:  Diagnosis Date  . Asthma   . Chronic combined systolic and diastolic CHF (congestive heart failure) ([place])    a. TTE 02/2017: EF 45-50%, basal amd midanteroseptal HK b. 05/2017: echo showing EF of 35-40%, mild AI, moderate MR, and mildly dilated LA.   Marland Kitchen Coronary artery disease, non-occlusive    a. LHC 02/2017 normal coronary arteries  . Hyperlipidemia   . Hypertension   . Morbid obesity ([place])   . OSA (obstructive sleep apnea)    a. On CPAP w/ O2 bleed in.  . Pulmonary hypertension ([place])   . Type II diabetes mellitus [facility])     HOSPITAL COURSE:  77 year old female with chronic systolic heart failure ejection fraction 45% who presents with shortness of breath.  1. Acute on chronic systolic heart failure: Patient has diuresed well with IV Lasix. She will be discharged today on torsemide which is her home regimen. She will follow-up with CHF clinic.  2. Hypertension: Patient can follow-up with her outpatient pulmonologist  3. Sleep apnea: Patient is encouraged to use CPAP  4. Mild intermittent asthma without signs of exacerbation at this time  5. Diabetes: Patient will continue outpatient medications with ADA diet   DISCHARGE CONDITIONS AND DIET:   Stable for discharge on heart healthy diabetic diet  CONSULTS OBTAINED:  Treatment Team:  Minna Merritts, MD  DRUG ALLERGIES:   Allergies  Allergen  Reactions  . Ace Inhibitors Anaphylaxis    Tongue swelling  . Shellfish Allergy Hives and Swelling    Swelling around face and mouth     DISCHARGE MEDICATIONS:   Allergies as of 11/12/2017      Reactions   Ace Inhibitors Anaphylaxis   Tongue swelling   Shellfish Allergy Hives, Swelling   Swelling around face and mouth      Medication List    STOP taking these medications   ALPRAZolam 0.25 MG tablet Commonly known as:  XANAX   montelukast 10 MG tablet Commonly known as:  SINGULAIR   predniSONE 10 MG tablet Commonly known as:  DELTASONE     TAKE these medications   arformoterol 15 MCG/2ML Nebu Commonly known as:  BROVANA Take 2 mLs (15 mcg total) 2 (two) times daily by nebulization. DX: COPD J44.9   aspirin 81 MG EC tablet Take 1 tablet (81 mg total) by mouth daily.   atorvastatin 40 MG tablet Commonly known as:  LIPITOR Take 40 mg by mouth daily.   budesonide 0.25 MG/2ML nebulizer solution Commonly known as:  PULMICORT Take 2 mLs (0.25 mg total) by nebulization 2 (two) times daily. DX: COPD J44.9   carvedilol 6.25 MG tablet Commonly known as:  COREG Take 1 tablet (6.25 mg total) by mouth daily.   COMBIVENT IN Inhale 3 puffs into the lungs every 4 (four) hours as needed.   EYE VITAMINS PO Take 1 tablet by mouth 2 (two) times daily.   levocetirizine 5 MG tablet Commonly known  asHarlow Ohms Take 10 mg by mouth daily.   metFORMIN 850 MG tablet Commonly known as:  GLUCOPHAGE Take 1 tablet by mouth 3 (three) times daily.   mirtazapine 45 MG tablet Commonly known as:  REMERON Take 1 tablet by mouth at bedtime.   potassium chloride SA 20 MEQ tablet Commonly known as:  K-DUR,KLOR-CON Take 1 tablet (20 mEq total) by mouth daily.   salmeterol 50 MCG/DOSE diskus inhaler Commonly known as:  SEREVENT DISKUS Inhale 1 puff into the lungs 2 (two) times daily.   senna-docusate 8.6-50 MG tablet Commonly known as:  Senokot-S Take 2 tablets by mouth 2 (two) times  daily.   spironolactone 25 MG tablet Commonly known as:  ALDACTONE Take 0.5 tablets (12.5 mg total) by mouth daily.   tiotropium 18 MCG inhalation capsule Commonly known as:  SPIRIVA HANDIHALER Place 1 capsule (18 mcg total) into inhaler and inhale daily.   torsemide 20 MG tablet Commonly known as:  DEMADEX Take 1 tablet (20 mg total) by mouth 2 (two) times daily. What changed:    how much to take  how to take this  when to take this  additional instructions         Today   CHIEF COMPLAINT:   Patient is feeling better this point. Denies shortness of breath, lower extremity edema or chest pain.   VITAL SIGNS:  Blood pressure (!) 147/68, pulse (!) 106, temperature 97.6 F (36.4 C), temperature source Oral, resp. rate 16, height 4\' 11"  (1.499 m), weight 74 kg (163 lb 3.2 oz), SpO2 97 %.   REVIEW OF SYSTEMS:  Review of Systems  Constitutional: Negative.  Negative for chills, fever and malaise/fatigue.  HENT: Negative.  Negative for ear discharge, ear pain, hearing loss, nosebleeds and sore throat.   Eyes: Negative.  Negative for blurred vision and pain.  Respiratory: Negative.  Negative for cough, hemoptysis, shortness of breath and wheezing.   Cardiovascular: Negative.  Negative for chest pain, palpitations and leg swelling.  Gastrointestinal: Negative.  Negative for abdominal pain, blood in stool, diarrhea, nausea and vomiting.  Genitourinary: Negative.  Negative for dysuria.  Musculoskeletal: Negative.  Negative for back pain.  Skin: Negative.   Neurological: Negative for dizziness, tremors, speech change, focal weakness, seizures and headaches.  Endo/Heme/Allergies: Negative.  Does not bruise/bleed easily.  Psychiatric/Behavioral: Negative.  Negative for depression, hallucinations and suicidal ideas.     PHYSICAL EXAMINATION:  GENERAL:  77 y.o.-year-old patient lying in the bed with no acute distress.  NECK:  Supple, no jugular venous distention. No thyroid  enlargement, no tenderness.  LUNGS: Normal breath sounds bilaterally, no wheezing, rales,rhonchi  No use of accessory muscles of respiration.  CARDIOVASCULAR: S1, S2 normal. No murmurs, rubs, or gallops.  ABDOMEN: Soft, non-tender, non-distended. Bowel sounds present. No organomegaly or mass.  EXTREMITIES: No pedal edema, cyanosis, or clubbing.  PSYCHIATRIC: The patient is alert and oriented x 3.  SKIN: No obvious rash, lesion, or ulcer.   DATA REVIEW:   CBC Recent Labs  Lab 11/11/17 0638  WBC 10.7  HGB 10.0*  HCT 31.0*  PLT 296    Chemistries  Recent Labs  Lab 11/12/17 0519  NA 143  K 3.9  CL 107  CO2 25  GLUCOSE 100*  BUN 45*  CREATININE 1.67*  CALCIUM 8.8*    Cardiac Enzymes Recent Labs  Lab 11/11/17 0638 11/11/17 1215 11/11/17 1906  TROPONINI 0.06* 0.05* 0.05*    Microbiology Results  @[email or web address] @  RADIOLOGY:  Dg Chest  Portable 1 View  Result Date: 11/11/2017 CLINICAL DATA:  Shortness of breath. EXAM: PORTABLE CHEST 1 VIEW COMPARISON:  08/24/2017 FINDINGS: Unchanged cardiomegaly and mediastinal contours. Worsening in pulmonary vascular congestion. Minimal central peribronchial cuffing may be mild pulmonary edema. No confluent airspace disease. No large pleural effusion. No pneumothorax. IMPRESSION: Increase in vascular congestion, possible mild central pulmonary edema. Unchanged cardiomegaly from prior exams. Electronically Signed   By: Jeb Levering M.D.   On: 11/11/2017 01:24      Allergies as of 11/12/2017      Reactions   Ace Inhibitors Anaphylaxis   Tongue swelling   Shellfish Allergy Hives, Swelling   Swelling around face and mouth      Medication List    STOP taking these medications   ALPRAZolam 0.25 MG tablet Commonly known as:  XANAX   montelukast 10 MG tablet Commonly known as:  SINGULAIR   predniSONE 10 MG tablet Commonly known as:  DELTASONE     TAKE these medications   arformoterol 15 MCG/2ML Nebu Commonly known as:   BROVANA Take 2 mLs (15 mcg total) 2 (two) times daily by nebulization. DX: COPD J44.9   aspirin 81 MG EC tablet Take 1 tablet (81 mg total) by mouth daily.   atorvastatin 40 MG tablet Commonly known as:  LIPITOR Take 40 mg by mouth daily.   budesonide 0.25 MG/2ML nebulizer solution Commonly known as:  PULMICORT Take 2 mLs (0.25 mg total) by nebulization 2 (two) times daily. DX: COPD J44.9   carvedilol 6.25 MG tablet Commonly known as:  COREG Take 1 tablet (6.25 mg total) by mouth daily.   COMBIVENT IN Inhale 3 puffs into the lungs every 4 (four) hours as needed.   EYE VITAMINS PO Take 1 tablet by mouth 2 (two) times daily.   levocetirizine 5 MG tablet Commonly known as:  XYZAL Take 10 mg by mouth daily.   metFORMIN 850 MG tablet Commonly known as:  GLUCOPHAGE Take 1 tablet by mouth 3 (three) times daily.   mirtazapine 45 MG tablet Commonly known as:  REMERON Take 1 tablet by mouth at bedtime.   potassium chloride SA 20 MEQ tablet Commonly known as:  K-DUR,KLOR-CON Take 1 tablet (20 mEq total) by mouth daily.   salmeterol 50 MCG/DOSE diskus inhaler Commonly known as:  SEREVENT DISKUS Inhale 1 puff into the lungs 2 (two) times daily.   senna-docusate 8.6-50 MG tablet Commonly known as:  Senokot-S Take 2 tablets by mouth 2 (two) times daily.   spironolactone 25 MG tablet Commonly known as:  ALDACTONE Take 0.5 tablets (12.5 mg total) by mouth daily.   tiotropium 18 MCG inhalation capsule Commonly known as:  SPIRIVA HANDIHALER Place 1 capsule (18 mcg total) into inhaler and inhale daily.   torsemide 20 MG tablet Commonly known as:  DEMADEX Take 1 tablet (20 mg total) by mouth 2 (two) times daily. What changed:    how much to take  how to take this  when to take this  additional instructions        A  Management plans discussed with the patient and she is in agreement. Stable for discharge home  Patient should follow up with pcp  CODE STATUS:      Code Status Orders  (From admission, onward)        Start     Ordered   11/11/17 0614  Do not attempt resuscitation (DNR)  Continuous    Question Answer Comment  In the event of cardiac  or respiratory ARREST Do not call a "code blue"   In the event of cardiac or respiratory ARREST Do not perform Intubation, CPR, defibrillation or ACLS   In the event of cardiac or respiratory ARREST Use medication by any route, position, wound care, and other measures to relive pain and suffering. May use oxygen, suction and manual treatment of airway obstruction as needed for comfort.   Comments RN may pronounce      11/11/17 9242    Code Status History    Date Active Date Inactive Code Status Order ID Comments User Context   08/24/2017 12:45 08/26/2017 19:07 DNR [id number]  Nicholes Mango, MD ED   08/20/2017 12:49 08/23/2017 21:42 DNR [id number]  Idelle Crouch, MD Inpatient   08/03/2017 14:09 08/06/2017 19:12 Full Code [id number]  Henreitta Leber, MD ED   06/27/2017 04:29 07/04/2017 16:59 DNR [id number]  Saundra Shelling, MD Inpatient   06/13/2017 04:25 06/16/2017 18:16 DNR [id number]  Mikael Spray, NP Inpatient   06/12/2017 03:28 06/13/2017 04:25 Full Code [id number]  Hugelmeyer, Ubaldo Glassing, DO Inpatient   06/12/2017 02:00 06/12/2017 03:28 DNR [id number]  Harvie Bridge, DO ED   05/29/2017 02:49 06/02/2017 21:23 DNR [id number]  Verlin Dike, RN Inpatient   05/28/2017 22:59 05/29/2017 02:49 Full Code [id number]  Harvie Bridge, DO Inpatient   05/04/2017 17:19 05/08/2017 17:18 DNR [id number]  Loletha Grayer, MD ED   03/09/2017 12:34 03/11/2017 22:08 Full Code [id number]  Minna Merritts, MD Inpatient   03/07/2017 05:12 03/09/2017 12:34 Full Code [id number]  Saundra Shelling, MD Inpatient    Advance Directive Documentation     Most Recent Value  Type of Advance Directive  Healthcare Power of Attorney  Pre-existing out of facility DNR order (yellow form or pink MOST form)  No data  "MOST" Form in Place?  No  data      TOTAL TIME TAKING CARE OF THIS PATIENT: 38 minutes.    Note: This dictation was prepared with Dragon dictation along with smaller phrase technology. Any transcriptional errors that result from this process are unintentional.  Abbiegail Landgren M.D on 11/12/2017 at 11:26 AM  Between 7am to 6pm - Pager - [phone] After 6pm go to [email or web address] - password [email or web address] [facility]  Office  [phone]  CC: Primary care physician; Cletis Athens, MD

## 2017-11-12 NOTE — Care Management Obs Status (Signed)
[facility] NOTIFICATION   Patient Details  Name: Angela Pham MRN: [id number] Date of Birth: August 08, 1941   Medicare Observation Status Notification Given:  No, [name] had already left the hospital prior to delivery of OBS letter today.     Darwyn Ponzo A, RN 11/12/2017, 2:02 PM

## 2017-11-12 NOTE — Progress Notes (Signed)
IV and tele removed from patient. Discharge instructions given to patient. Verbalized Understanding. No distress at this time. Family to transport patient home.

## 2017-11-12 NOTE — Progress Notes (Signed)
Progress Note  Patient Name: Angela Pham Date of Encounter: 11/12/2017  Primary Cardiologist: Kathlyn Sacramento, MD   Subjective   Feels better.  SHe thinks her sx are more from her lungs.  She denies any chest pain, but has some tightness like she has had with asthma attacks in the past.   Inpatient Medications    Scheduled Meds: . arformoterol  15 mcg Nebulization BID  . aspirin EC  81 mg Oral Daily  . atorvastatin  40 mg Oral Daily  . budesonide  0.25 mg Nebulization BID  . carvedilol  6.25 mg Oral BID  . enoxaparin (LOVENOX) injection  30 mg Subcutaneous Q24H  . furosemide  40 mg Intravenous BID  . insulin aspart  0-15 Units Subcutaneous TID WC  . insulin aspart  0-5 Units Subcutaneous QHS  . ipratropium-albuterol  3 mL Nebulization BID  . ketotifen  1 drop Both Eyes BID  . loratadine  10 mg Oral Daily  . metFORMIN  850 mg Oral TID WC  . mirtazapine  45 mg Oral QHS  . montelukast  10 mg Oral QHS  . multivitamin-lutein  1 capsule Oral BID  . senna-docusate  1 tablet Oral q morning - 10a  . senna-docusate  2 tablet Oral QHS  . sodium chloride flush  3 mL Intravenous Q12H  . spironolactone  12.5 mg Oral Daily  . tiotropium  18 mcg Inhalation Daily   Continuous Infusions: . sodium chloride     PRN Meds: sodium chloride, acetaminophen, ALPRAZolam, ipratropium-albuterol, ondansetron (ZOFRAN) IV, sodium chloride flush   Vital Signs    Vitals:   11/12/17 0312 11/12/17 0559 11/12/17 0806 11/12/17 0830  BP: (!) 111/45   (!) 147/68  Pulse: 90   (!) 106  Resp: 18   16  Temp: 98.3 F (36.8 C)   97.6 F (36.4 C)  TempSrc: Oral   Oral  SpO2: 96%  95% 97%  Weight:  163 lb 3.2 oz (74 kg)    Height:        Intake/Output Summary (Last 24 hours) at 11/12/2017 1059 Last data filed at 11/12/2017 1021 Gross per 24 hour  Intake 840 ml  Output 450 ml  Net 390 ml   Filed Weights   11/11/17 0104 11/12/17 0559  Weight: 170 lb (77.1 kg) 163 lb 3.2 oz (74 kg)    Telemetry    NSR - Personally Reviewed  ECG    Sinus tach, LBBB on 2/15 - Personally Reviewed  Physical Exam   GEN: No acute distress.   Neck: No JVD Cardiac: RRR, no murmurs, rubs, or gallops.  Respiratory: Clear to auscultation bilaterally. GI: Soft, nontender, non-distended  MS: No edema; No deformity. Neuro:  Nonfocal  Psych: Normal affect   Labs    Chemistry Recent Labs  Lab 11/11/17 0107 11/11/17 0638 11/12/17 0519  NA 143  --  143  K 4.3  --  3.9  CL 110  --  107  CO2 21*  --  25  GLUCOSE 180*  --  100*  BUN 47*  --  45*  CREATININE 1.74* 1.71* 1.67*  CALCIUM 8.9  --  8.8*  GFRNONAA 28* 28* 29*  GFRAA 32* 33* 34*  ANIONGAP 12  --  11     Hematology Recent Labs  Lab 11/11/17 0107 11/11/17 0638  WBC 12.6* 10.7  RBC 3.85 3.55*  HGB 11.0* 10.0*  HCT 34.0* 31.0*  MCV 88.3 87.5  MCH 28.5 28.1  MCHC 32.3 32.1  RDW 19.1* 19.2*  PLT 341 296    Cardiac Enzymes Recent Labs  Lab 11/11/17 0107 11/11/17 0638 11/11/17 1215 11/11/17 1906  TROPONINI 0.05* 0.06* 0.05* 0.05*   No results for input(s): TROPIPOC in the last 168 hours.   BNP Recent Labs  Lab 11/11/17 0638  BNP 906.0*     DDimer No results for input(s): DDIMER in the last 168 hours.   Radiology    Dg Chest Portable 1 View  Result Date: 11/11/2017 CLINICAL DATA:  Shortness of breath. EXAM: PORTABLE CHEST 1 VIEW COMPARISON:  08/24/2017 FINDINGS: Unchanged cardiomegaly and mediastinal contours. Worsening in pulmonary vascular congestion. Minimal central peribronchial cuffing may be mild pulmonary edema. No confluent airspace disease. No large pleural effusion. No pneumothorax. IMPRESSION: Increase in vascular congestion, possible mild central pulmonary edema. Unchanged cardiomegaly from prior exams. Electronically Signed   By: Jeb Levering M.D.   On: 11/11/2017 01:24    Cardiac Studies   2018 echo: Left ventricle: The cavity size was at the upper limits of   normal. Wall thickness was  increased in a pattern of mild LVH.   Systolic function was mildly reduced. The estimated ejection   fraction was in the range of 45% to 50%. There is hypokinesis of   the basal-midanteroseptal myocardium. - Aortic valve: Poorly visualized. A bicuspid morphology cannot be   excluded. There was mild regurgitation. - Mitral valve: Calcified annulus. Mildly thickened leaflets .   Leaflet separation was mildly reduced. There was moderate   regurgitation directed posteriorly. - Left atrium: The atrium was mildly dilated. - Right ventricle: The cavity size was normal. Wall thickness was   normal. Systolic function was normal. - Pulmonary arteries: Systolic pressure was moderately increased,   in the range of 55 mm Hg to 60 mm Hg.  Patient Profile     77 y.o. female with mildly decreased EF, elevated troponin  Assessment & Plan    1) Chronic systolic and diastolic heart failure: She appears euvolemic on exam but BNP was > 900.  She has had AKI with diuresis.  Given that clinically, she does not appear volume overloaded, would plan to switch back to home dose of torsemide 20 mg BID.  Follow renal function and decrease diuretics if Cr. Increases.   2) Known pulmonary HTN from prior echo, likely from chronic lung disease, asthma, OSA.  3) Minimal troponin elevation.  No CAD by prior cath.  Flat trend.  Does not appear to be ACS.  Likely demand ischemia.  THis should improve as lung issues are treated.   For questions or updates, please contact [place] Please consult [email or web address] for contact info under Cardiology/STEMI.      Signed, Larae Grooms, MD  11/12/2017, 10:59 AM

## 2017-11-12 NOTE — Care Management Note (Signed)
Case Management Note  Patient Details  Name: Angela Pham MRN: [id number] Date of Birth: 01-27-1941  Subjective/Objective:   [name] reports that she is open to [facility] for RN and PT. A referral for resumption of care was called to Fuller Plan at [facility].                Action/Plan:   Expected Discharge Date:  11/12/17               Expected Discharge Plan:     In-House Referral:     Discharge planning Services     Post Acute Care Choice:    Choice offered to:     DME Arranged:    DME Agency:     HH Arranged:    HH Agency:     Status of Service:     If discussed at [employer], dates discussed:    Additional Comments:  Jru Pense A, RN 11/12/2017, 11:48 AM

## 2017-11-12 NOTE — Care Management CC44 (Signed)
Condition Code 44 Documentation Completed  Patient Details  Name: Angela Pham MRN: [id number] Date of Birth: July 31, 1941   Condition Code 44 given:  (No) Patient signature on Condition Code 44 notice:   No Documentation of 2 MD's agreement:   No Code 44 added to claim:   No  No Code 44 given per [name] has been discharged and already left this hospital. At time of discharge [name] was an Inpatient and is still shown as Inpatient at the time this note is written.   Emanuelle Hammerstrom A, RN 11/12/2017, 2:03 PM

## 2017-11-16 ENCOUNTER — Telehealth: Payer: Self-pay

## 2017-11-16 NOTE — Telephone Encounter (Signed)
EMMI follow-up: Called Ms. Riehl, PT is there right now and asked that I call her back later. Will follow-up later.

## 2017-11-16 NOTE — Telephone Encounter (Signed)
EMMI follow-up: I talked with Angela Pham and she said she had contacted the [facility] program and they had been out to do a home visit and she would know by March 1st if she would get into that program.  She went to her primary care physician today but will not follow-up on other appts. till she hears back from [facility] as that would eliminate her transportation issues. No other concerns.

## 2017-11-18 ENCOUNTER — Telehealth: Payer: Self-pay

## 2017-11-18 NOTE — Telephone Encounter (Signed)
-----   Message from Alisa Graff, [place] sent at 11/13/2017  6:00 PM EST ----- Regarding: f/u appt needed Contact: [phone] Recently admitted; hospitalist made referral  She's been seen by [place] in the past

## 2017-11-18 NOTE — Telephone Encounter (Signed)
Spoke with Ms. Crittendon she has declined making an appointment at this time as she is currently trying to get on board with the [facility] program. She states that if she is unable to do so she will call us back and make an appointment but she would like to see how that works out first.

## 2017-12-23 ENCOUNTER — Telehealth: Payer: Self-pay | Admitting: Pulmonary Disease

## 2017-12-23 NOTE — Telephone Encounter (Signed)
Attempted to schedule 3 month follow up Patient has joined the [facility] program Deleting recall per patient request

## 2018-01-16 ENCOUNTER — Emergency Department: Payer: No Typology Code available for payment source

## 2018-01-16 ENCOUNTER — Encounter: Payer: Self-pay | Admitting: Emergency Medicine

## 2018-01-16 ENCOUNTER — Other Ambulatory Visit: Payer: Self-pay

## 2018-01-16 ENCOUNTER — Emergency Department
Admission: EM | Admit: 2018-01-16 | Discharge: 2018-01-16 | Disposition: A | Discharge status: Home / Self Care | Payer: No Typology Code available for payment source | Source: Home / Self Care | Attending: Emergency Medicine | Admitting: Emergency Medicine

## 2018-01-16 DIAGNOSIS — I5042 Chronic combined systolic (congestive) and diastolic (congestive) heart failure: Secondary | ICD-10-CM

## 2018-01-16 DIAGNOSIS — Z87891 Personal history of nicotine dependence: Secondary | ICD-10-CM | POA: Insufficient documentation

## 2018-01-16 DIAGNOSIS — J449 Chronic obstructive pulmonary disease, unspecified: Secondary | ICD-10-CM

## 2018-01-16 DIAGNOSIS — Z7984 Long term (current) use of oral hypoglycemic drugs: Secondary | ICD-10-CM | POA: Insufficient documentation

## 2018-01-16 DIAGNOSIS — N183 Chronic kidney disease, stage 3 (moderate): Secondary | ICD-10-CM | POA: Insufficient documentation

## 2018-01-16 DIAGNOSIS — J45909 Unspecified asthma, uncomplicated: Secondary | ICD-10-CM | POA: Insufficient documentation

## 2018-01-16 DIAGNOSIS — Z79899 Other long term (current) drug therapy: Secondary | ICD-10-CM

## 2018-01-16 DIAGNOSIS — E1122 Type 2 diabetes mellitus with diabetic chronic kidney disease: Secondary | ICD-10-CM

## 2018-01-16 DIAGNOSIS — R0602 Shortness of breath: Secondary | ICD-10-CM | POA: Insufficient documentation

## 2018-01-16 DIAGNOSIS — I130 Hypertensive heart and chronic kidney disease with heart failure and stage 1 through stage 4 chronic kidney disease, or unspecified chronic kidney disease: Secondary | ICD-10-CM | POA: Diagnosis not present

## 2018-01-16 DIAGNOSIS — R0603 Acute respiratory distress: Secondary | ICD-10-CM | POA: Diagnosis not present

## 2018-01-16 DIAGNOSIS — Z7982 Long term (current) use of aspirin: Secondary | ICD-10-CM | POA: Insufficient documentation

## 2018-01-16 LAB — COMPREHENSIVE METABOLIC PANEL
ALT: 7 U/L — ABNORMAL LOW (ref 14–54)
AST: 19 U/L (ref 15–41)
Albumin: 3.6 g/dL (ref 3.5–5.0)
Alkaline Phosphatase: 47 U/L (ref 38–126)
Anion gap: 10 (ref 5–15)
BUN: 66 mg/dL — AB (ref 6–20)
CHLORIDE: 103 mmol/L (ref 101–111)
CO2: 26 mmol/L (ref 22–32)
CREATININE: 2.11 mg/dL — AB (ref 0.44–1.00)
Calcium: 8.4 mg/dL — ABNORMAL LOW (ref 8.9–10.3)
GFR calc Af Amer: 25 mL/min — ABNORMAL LOW (ref >60)
GFR calc non Af Amer: 22 mL/min — ABNORMAL LOW (ref >60)
GLUCOSE: 129 mg/dL — AB (ref 65–99)
Potassium: 4.1 mmol/L (ref 3.5–5.1)
SODIUM: 139 mmol/L (ref 135–145)
Total Bilirubin: 0.7 mg/dL (ref 0.3–1.2)
Total Protein: 6.8 g/dL (ref 6.5–8.1)

## 2018-01-16 LAB — TROPONIN I: Troponin I: 0.05 ng/mL (ref <0.03)

## 2018-01-16 LAB — CBC
HCT: 30.4 % — ABNORMAL LOW (ref 35.0–47.0)
Hemoglobin: 10.5 g/dL — ABNORMAL LOW (ref 12.0–16.0)
MCH: 30.5 pg (ref 26.0–34.0)
MCHC: 34.3 g/dL (ref 32.0–36.0)
MCV: 88.9 fL (ref 80.0–100.0)
PLATELETS: 279 10*3/uL (ref 150–440)
RBC: 3.42 MIL/uL — ABNORMAL LOW (ref 3.80–5.20)
RDW: 15.4 % — AB (ref 11.5–14.5)
WBC: 13.2 10*3/uL — ABNORMAL HIGH (ref 3.6–11.0)

## 2018-01-16 LAB — BRAIN NATRIURETIC PEPTIDE: B Natriuretic Peptide: 319 pg/mL — ABNORMAL HIGH (ref 0.0–100.0)

## 2018-01-16 LAB — LIPASE, BLOOD: LIPASE: 51 U/L (ref 11–51)

## 2018-01-16 NOTE — ED Provider Notes (Signed)
[facility] Emergency Department Provider Note   ____________________________________________    I have reviewed the triage vital signs and the nursing notes.   HISTORY  Chief Complaint Shortness of breath    HPI Angela Pham is a 77 y.o. female who presents with complaints of shortness of breath.  Patient reports she developed shortness of breath overnight she developed relatively abruptly.  She does report a history of CHF, CAD and COPD.  She is a patient of [facility].  She denies chest pain to me.  She does admit to feeling significantly better at this time.  No fevers chills or cough.  Her weight has been stable   Past Medical History:  Diagnosis Date  . Asthma   . Chronic combined systolic and diastolic CHF (congestive heart failure) ([place])    a. TTE 02/2017: EF 45-50%, basal amd midanteroseptal HK b. 05/2017: echo showing EF of 35-40%, mild AI, moderate MR, and mildly dilated LA.   Marland Kitchen Coronary artery disease, non-occlusive    a. LHC 02/2017 normal coronary arteries  . Hyperlipidemia   . Hypertension   . Morbid obesity ([place])   . OSA (obstructive sleep apnea)    a. On CPAP w/ O2 bleed in.  . Pulmonary hypertension ([place])   . Type II diabetes mellitus [facility])     Patient Active Problem List   Diagnosis Date Noted  . CHF (congestive heart failure) ([place]) 11/11/2017  . Anemia 09/26/2017  . Monoclonal gammopathy 09/26/2017  . Acute respiratory failure ([place]) 08/24/2017  . Acute URI 08/20/2017  . Acute respiratory failure with hypoxia ([place]) 08/03/2017  . DNR (do not resuscitate) 07/02/2017  . Respiratory failure ([place]) 06/27/2017  . Chronic diastolic heart failure ([place]) 06/24/2017  . Asthma 06/24/2017  . Diabetes ([place]) 06/24/2017  . Essential hypertension 06/23/2017  . Hyperlipidemia, unspecified 06/23/2017  . CKD (chronic kidney disease) stage 3, GFR 30-59 ml/min (HCC) 06/23/2017  . Pressure injury of skin 06/12/2017  . COPD exacerbation  ([place]) 05/28/2017  . SOB (shortness of breath)   . Chronic combined systolic and diastolic heart failure ([place]) 03/07/2017    Past Surgical History:  Procedure Laterality Date  . c-section    . RIGHT/LEFT HEART CATH AND CORONARY ANGIOGRAPHY N/A 03/09/2017   Procedure: Right/Left Heart Cath and Coronary Angiography;  Surgeon: Minna Merritts, MD;  Location: [place] CV [facility];  Service: Cardiovascular;  Laterality: N/A;    Prior to Admission medications   Medication Sig Start Date End Date Taking? Authorizing Provider  arformoterol (BROVANA) 15 MCG/2ML NEBU Take 2 mLs (15 mcg total) 2 (two) times daily by nebulization. DX: COPD J44.9 08/08/17   Wilhelmina Mcardle, MD  aspirin EC 81 MG EC tablet Take 1 tablet (81 mg total) by mouth daily. 03/12/17   [name], MD  atorvastatin (LIPITOR) 40 MG tablet Take 40 mg by mouth daily.    [provider]  budesonide (PULMICORT) 0.25 MG/2ML nebulizer solution Take 2 mLs (0.25 mg total) by nebulization 2 (two) times daily. DX: COPD J44.9 10/06/17   Wilhelmina Mcardle, MD  carvedilol (COREG) 6.25 MG tablet Take 1 tablet (6.25 mg total) by mouth daily. 11/12/17   [name], MD  Ipratropium-Albuterol (COMBIVENT IN) Inhale 3 puffs into the lungs every 4 (four) hours as needed.    [provider]  levocetirizine (XYZAL) 5 MG tablet Take 10 mg by mouth daily. 03/18/17   [provider]  metFORMIN (GLUCOPHAGE) 850 MG tablet Take 1  tablet by mouth 3 (three) times daily.     [provider]  mirtazapine (REMERON) 45 MG tablet Take 1 tablet by mouth at bedtime.    [provider]  Multiple Vitamins-Minerals (EYE VITAMINS PO) Take 1 tablet by mouth 2 (two) times daily.     [provider]  potassium chloride SA (K-DUR,KLOR-CON) 20 MEQ tablet Take 1 tablet (20 mEq total) by mouth daily. 08/23/17   Fritzi Mandes, MD  salmeterol (SEREVENT DISKUS) 50 MCG/DOSE diskus inhaler Inhale 1 puff into the lungs 2 (two) times  daily. 10/07/17 10/07/18  Wilhelmina Mcardle, MD  senna-docusate (SENOKOT-S) 8.6-50 MG tablet Take 2 tablets by mouth 2 (two) times daily. 07/04/17   Hillary Bow, MD  spironolactone (ALDACTONE) 25 MG tablet Take 0.5 tablets (12.5 mg total) by mouth daily. 06/23/17   Strader, Fransisco Hertz, PA-C  tiotropium (SPIRIVA HANDIHALER) 18 MCG inhalation capsule Place 1 capsule (18 mcg total) into inhaler and inhale daily. 07/04/17 07/04/18  Hillary Bow, MD  torsemide (DEMADEX) 20 MG tablet Take 1 tablet (20 mg total) by mouth 2 (two) times daily. 11/12/17   [name], MD     Allergies Ace inhibitors and Shellfish allergy  Family History  Problem Relation Age of Onset  . CAD Father   . Colon cancer Sister   . Leukemia Brother   . Throat cancer Brother   . Cervical cancer Sister     Social History Social History   Tobacco Use  . Smoking status: Former Smoker    Packs/day: 0.30    Years: 20.00    Pack years: 6.00    Types: Cigarettes  . Smokeless tobacco: Never Used  Substance Use Topics  . Alcohol use: No  . Drug use: No    Review of Systems  Constitutional: No fever/chills Eyes: No visual changes.  ENT: No sore throat. Cardiovascular: Denies chest pain. Respiratory: As above Gastrointestinal: No abdominal pain.  No nausea, no vomiting.   Genitourinary: Negative for dysuria. Musculoskeletal: Negative for back pain. Skin: Negative for rash. Neurological: Negative for headaches or weakness   ____________________________________________   PHYSICAL EXAM:  VITAL SIGNS: ED Triage Vitals  Enc Vitals Group     BP 01/16/18 0707 127/80     Pulse Rate 01/16/18 0707 (!) 102     Resp 01/16/18 0707 20     Temp 01/16/18 0707 97.7 F (36.5 C)     Temp Source 01/16/18 0707 Oral     SpO2 01/16/18 0707 94 %     Weight 01/16/18 0708 73 kg (161 lb)     Height 01/16/18 0708 1.499 m (4\' 11" )     Head Circumference --      Peak Flow --      Pain Score 01/16/18 0708 6     Pain Loc --       Pain Edu? --      Excl. in [place]? --     Constitutional: Alert and oriented. No acute distress. Pleasant and interactive Eyes: Conjunctivae are normal.   Nose: No congestion/rhinnorhea. Mouth/Throat: Mucous membranes are moist.    Cardiovascular: Normal rate, regular rhythm. Grossly normal heart sounds.  Good peripheral circulation. Respiratory: Normal respiratory effort.  No retractions. Lungs CTAB. Gastrointestinal: Soft and nontender. No distention.  No CVA tenderness. Genitourinary: deferred Musculoskeletal: No lower extremity tenderness nor edema.  Warm and well perfused Neurologic:  Normal speech and language. No gross focal neurologic deficits are appreciated.  Skin:  Skin is warm, dry and intact.  No rash noted. Psychiatric: Mood and affect are normal. Speech and behavior are normal.  ____________________________________________   LABS (all labs ordered are listed, but only abnormal results are displayed)  Labs Reviewed  COMPREHENSIVE METABOLIC PANEL - Abnormal; Notable for the following components:      Result Value   Glucose, Bld 129 (*)    BUN 66 (*)    Creatinine, Ser 2.11 (*)    Calcium 8.4 (*)    ALT 7 (*)    GFR calc non Af Amer 22 (*)    GFR calc Af Amer 25 (*)    All other components within normal limits  CBC - Abnormal; Notable for the following components:   WBC 13.2 (*)    RBC 3.42 (*)    Hemoglobin 10.5 (*)    HCT 30.4 (*)    RDW 15.4 (*)    All other components within normal limits  TROPONIN I - Abnormal; Notable for the following components:   Troponin I 0.05 (*)    All other components within normal limits  BRAIN NATRIURETIC PEPTIDE - Abnormal; Notable for the following components:   B Natriuretic Peptide 319.0 (*)    All other components within normal limits  LIPASE, BLOOD   ____________________________________________  EKG  ED ECG REPORT I, Lavonia Drafts, the attending physician, personally viewed and interpreted this ECG.  Date:  01/16/2018  Rhythm: normal sinus rhythm QRS Axis: normal Intervals: normal ST/T Wave abnormalities: normal Narrative Interpretation: no evidence of acute ischemia  ____________________________________________  RADIOLOGY  Chest x-ray negative for pulmonary edema ____________________________________________   PROCEDURES  Procedure(s) performed: No  Procedures   Critical Care performed: No ____________________________________________   INITIAL IMPRESSION / ASSESSMENT AND PLAN / ED COURSE  Pertinent labs & imaging results that were available during my care of the patient were reviewed by me and considered in my medical decision making (see chart for details).  Patient presents with shortness of breath, lab work overall is reassuring.  Chronically elevated troponin.  Chest x-ray negative for pulmonary edema.  No wheezing on exam.  Patient seen by her PCP Dr. Ovid Curd in the emergency department who will follow-up closely with her and feels that she is appropriate for discharge.  I agree with this plan.    ____________________________________________   FINAL CLINICAL IMPRESSION(S) / ED DIAGNOSES  Final diagnoses:  Shortness of breath        Note:  This document was prepared using Dragon voice recognition software and may include unintentional dictation errors.    Lavonia Drafts, MD 01/16/18 [id number]

## 2018-01-16 NOTE — ED Triage Notes (Signed)
shortness of breath began 4 am. Per report wheezing present on arrival of EMS relieved by breathing treatment. Also R sided pain x 3 days.

## 2018-01-18 ENCOUNTER — Inpatient Hospital Stay
Admission: EM | Admit: 2018-01-18 | Discharge: 2018-01-22 | DRG: 291 | Disposition: A | Discharge status: Skilled Nursing Facility | Payer: No Typology Code available for payment source | Attending: Family Medicine | Admitting: Family Medicine

## 2018-01-18 ENCOUNTER — Emergency Department: Payer: No Typology Code available for payment source

## 2018-01-18 ENCOUNTER — Other Ambulatory Visit: Payer: Self-pay

## 2018-01-18 DIAGNOSIS — F419 Anxiety disorder, unspecified: Secondary | ICD-10-CM | POA: Diagnosis not present

## 2018-01-18 DIAGNOSIS — Z91013 Allergy to seafood: Secondary | ICD-10-CM

## 2018-01-18 DIAGNOSIS — I5043 Acute on chronic combined systolic (congestive) and diastolic (congestive) heart failure: Secondary | ICD-10-CM | POA: Diagnosis not present

## 2018-01-18 DIAGNOSIS — Z8249 Family history of ischemic heart disease and other diseases of the circulatory system: Secondary | ICD-10-CM

## 2018-01-18 DIAGNOSIS — G4733 Obstructive sleep apnea (adult) (pediatric): Secondary | ICD-10-CM | POA: Diagnosis present

## 2018-01-18 DIAGNOSIS — Z800 Family history of malignant neoplasm of digestive organs: Secondary | ICD-10-CM

## 2018-01-18 DIAGNOSIS — J432 Centrilobular emphysema: Secondary | ICD-10-CM

## 2018-01-18 DIAGNOSIS — Z808 Family history of malignant neoplasm of other organs or systems: Secondary | ICD-10-CM

## 2018-01-18 DIAGNOSIS — J9601 Acute respiratory failure with hypoxia: Secondary | ICD-10-CM | POA: Diagnosis not present

## 2018-01-18 DIAGNOSIS — Z87891 Personal history of nicotine dependence: Secondary | ICD-10-CM | POA: Diagnosis not present

## 2018-01-18 DIAGNOSIS — Z6832 Body mass index (BMI) 32.0-32.9, adult: Secondary | ICD-10-CM

## 2018-01-18 DIAGNOSIS — Z888 Allergy status to other drugs, medicaments and biological substances status: Secondary | ICD-10-CM

## 2018-01-18 DIAGNOSIS — Z7984 Long term (current) use of oral hypoglycemic drugs: Secondary | ICD-10-CM

## 2018-01-18 DIAGNOSIS — Z8049 Family history of malignant neoplasm of other genital organs: Secondary | ICD-10-CM | POA: Diagnosis not present

## 2018-01-18 DIAGNOSIS — I272 Pulmonary hypertension, unspecified: Secondary | ICD-10-CM | POA: Diagnosis present

## 2018-01-18 DIAGNOSIS — Z66 Do not resuscitate: Secondary | ICD-10-CM | POA: Diagnosis present

## 2018-01-18 DIAGNOSIS — I248 Other forms of acute ischemic heart disease: Secondary | ICD-10-CM | POA: Diagnosis present

## 2018-01-18 DIAGNOSIS — E785 Hyperlipidemia, unspecified: Secondary | ICD-10-CM | POA: Diagnosis present

## 2018-01-18 DIAGNOSIS — J441 Chronic obstructive pulmonary disease with (acute) exacerbation: Secondary | ICD-10-CM | POA: Diagnosis present

## 2018-01-18 DIAGNOSIS — J9621 Acute and chronic respiratory failure with hypoxia: Secondary | ICD-10-CM | POA: Diagnosis present

## 2018-01-18 DIAGNOSIS — Z806 Family history of leukemia: Secondary | ICD-10-CM | POA: Diagnosis not present

## 2018-01-18 DIAGNOSIS — J9622 Acute and chronic respiratory failure with hypercapnia: Secondary | ICD-10-CM | POA: Diagnosis present

## 2018-01-18 DIAGNOSIS — J811 Chronic pulmonary edema: Secondary | ICD-10-CM

## 2018-01-18 DIAGNOSIS — E1122 Type 2 diabetes mellitus with diabetic chronic kidney disease: Secondary | ICD-10-CM | POA: Diagnosis present

## 2018-01-18 DIAGNOSIS — I5030 Unspecified diastolic (congestive) heart failure: Secondary | ICD-10-CM

## 2018-01-18 DIAGNOSIS — I447 Left bundle-branch block, unspecified: Secondary | ICD-10-CM | POA: Diagnosis present

## 2018-01-18 DIAGNOSIS — I130 Hypertensive heart and chronic kidney disease with heart failure and stage 1 through stage 4 chronic kidney disease, or unspecified chronic kidney disease: Secondary | ICD-10-CM | POA: Diagnosis present

## 2018-01-18 DIAGNOSIS — I2510 Atherosclerotic heart disease of native coronary artery without angina pectoris: Secondary | ICD-10-CM | POA: Diagnosis present

## 2018-01-18 DIAGNOSIS — N184 Chronic kidney disease, stage 4 (severe): Secondary | ICD-10-CM | POA: Diagnosis present

## 2018-01-18 DIAGNOSIS — Z87892 Personal history of anaphylaxis: Secondary | ICD-10-CM

## 2018-01-18 DIAGNOSIS — R0603 Acute respiratory distress: Secondary | ICD-10-CM | POA: Diagnosis present

## 2018-01-18 DIAGNOSIS — Z7982 Long term (current) use of aspirin: Secondary | ICD-10-CM

## 2018-01-18 DIAGNOSIS — Z79899 Other long term (current) drug therapy: Secondary | ICD-10-CM

## 2018-01-18 DIAGNOSIS — R0602 Shortness of breath: Secondary | ICD-10-CM

## 2018-01-18 LAB — CBC WITH DIFFERENTIAL/PLATELET
BASOS PCT: 1 %
Basophils Absolute: 0.2 10*3/uL — ABNORMAL HIGH (ref 0–0.1)
EOS ABS: 0.3 10*3/uL (ref 0–0.7)
Eosinophils Relative: 1 %
HCT: 33.9 % — ABNORMAL LOW (ref 35.0–47.0)
HEMOGLOBIN: 11.3 g/dL — AB (ref 12.0–16.0)
Lymphocytes Relative: 25 %
Lymphs Abs: 4.9 10*3/uL — ABNORMAL HIGH (ref 1.0–3.6)
MCH: 30.3 pg (ref 26.0–34.0)
MCHC: 33.3 g/dL (ref 32.0–36.0)
MCV: 90.8 fL (ref 80.0–100.0)
Monocytes Absolute: 1 10*3/uL — ABNORMAL HIGH (ref 0.2–0.9)
Monocytes Relative: 5 %
NEUTROS PCT: 68 %
Neutro Abs: 13.7 10*3/uL — ABNORMAL HIGH (ref 1.4–6.5)
PLATELETS: 353 10*3/uL (ref 150–440)
RBC: 3.73 MIL/uL — AB (ref 3.80–5.20)
RDW: 16 % — ABNORMAL HIGH (ref 11.5–14.5)
WBC: 20 10*3/uL — AB (ref 3.6–11.0)

## 2018-01-18 LAB — BLOOD GAS, VENOUS
O2 SAT: 57 %
PATIENT TEMPERATURE: 37
PCO2 VEN: 73 mmHg — AB (ref 44.0–60.0)
pH, Ven: 7.12 — CL (ref 7.250–7.430)
pO2, Ven: 41 mmHg (ref 32.0–45.0)

## 2018-01-18 LAB — URINALYSIS, COMPLETE (UACMP) WITH MICROSCOPIC
BILIRUBIN URINE: NEGATIVE
Bacteria, UA: NONE SEEN
Glucose, UA: NEGATIVE mg/dL
HGB URINE DIPSTICK: NEGATIVE
Ketones, ur: NEGATIVE mg/dL
Leukocytes, UA: NEGATIVE
NITRITE: NEGATIVE
PH: 5 (ref 5.0–8.0)
Protein, ur: NEGATIVE mg/dL
SPECIFIC GRAVITY, URINE: 1.009 (ref 1.005–1.030)

## 2018-01-18 LAB — GLUCOSE, CAPILLARY
GLUCOSE-CAPILLARY: 207 mg/dL — AB (ref 65–99)
GLUCOSE-CAPILLARY: 206 mg/dL — AB (ref 65–99)
Glucose-Capillary: 149 mg/dL — ABNORMAL HIGH (ref 65–99)

## 2018-01-18 LAB — MRSA PCR SCREENING: MRSA by PCR: NEGATIVE

## 2018-01-18 LAB — BASIC METABOLIC PANEL
Anion gap: 9 (ref 5–15)
BUN: 63 mg/dL — ABNORMAL HIGH (ref 6–20)
CHLORIDE: 104 mmol/L (ref 101–111)
CO2: 23 mmol/L (ref 22–32)
Calcium: 8.5 mg/dL — ABNORMAL LOW (ref 8.9–10.3)
Creatinine, Ser: 2.18 mg/dL — ABNORMAL HIGH (ref 0.44–1.00)
GFR calc non Af Amer: 21 mL/min — ABNORMAL LOW (ref >60)
GFR, EST AFRICAN AMERICAN: 24 mL/min — AB (ref >60)
Glucose, Bld: 295 mg/dL — ABNORMAL HIGH (ref 65–99)
POTASSIUM: 5 mmol/L (ref 3.5–5.1)
Sodium: 136 mmol/L (ref 135–145)

## 2018-01-18 LAB — BRAIN NATRIURETIC PEPTIDE: B Natriuretic Peptide: 813 pg/mL — ABNORMAL HIGH (ref 0.0–100.0)

## 2018-01-18 LAB — TROPONIN I: Troponin I: 0.04 ng/mL (ref <0.03)

## 2018-01-18 LAB — MAGNESIUM: Magnesium: 2.1 mg/dL (ref 1.7–2.4)

## 2018-01-18 LAB — CREATININE, SERUM
Creatinine, Ser: 2.08 mg/dL — ABNORMAL HIGH (ref 0.44–1.00)
GFR, EST AFRICAN AMERICAN: 26 mL/min — AB (ref >60)
GFR, EST NON AFRICAN AMERICAN: 22 mL/min — AB (ref >60)

## 2018-01-18 LAB — FIBRIN DERIVATIVES D-DIMER (ARMC ONLY): Fibrin derivatives D-dimer (ARMC): 1539.83 ng/mL (FEU) — ABNORMAL HIGH (ref 0.00–499.00)

## 2018-01-18 MED ORDER — IPRATROPIUM-ALBUTEROL 0.5-2.5 (3) MG/3ML IN SOLN
RESPIRATORY_TRACT | Status: AC
  Filled 2018-01-18: qty 3

## 2018-01-18 MED ORDER — METHYLPREDNISOLONE SODIUM SUCC 125 MG IJ SOLR
125.0000 mg | Freq: Once | INTRAMUSCULAR | Status: AC
  Administered 2018-01-18: 125 mg via INTRAVENOUS

## 2018-01-18 MED ORDER — ALBUTEROL SULFATE (2.5 MG/3ML) 0.083% IN NEBU
INHALATION_SOLUTION | RESPIRATORY_TRACT | Status: AC
  Filled 2018-01-18: qty 6

## 2018-01-18 MED ORDER — CEFTRIAXONE SODIUM 2 G IJ SOLR
2.0000 g | Freq: Once | INTRAMUSCULAR | Status: AC
  Administered 2018-01-18: 2 g via INTRAVENOUS
  Filled 2018-01-18: qty 20

## 2018-01-18 MED ORDER — ACETAMINOPHEN 325 MG PO TABS: 650.0000 mg | ORAL_TABLET | Freq: Four times a day (QID) | ORAL | Status: DC | PRN

## 2018-01-18 MED ORDER — INSULIN ASPART 100 UNIT/ML ~~LOC~~ SOLN
0.0000 [IU] | Freq: Every day | SUBCUTANEOUS | Status: DC
  Administered 2018-01-18 – 2018-01-21 (×2): 2 [IU] via SUBCUTANEOUS
  Filled 2018-01-18 (×2): qty 1

## 2018-01-18 MED ORDER — HYDROCODONE-ACETAMINOPHEN 5-325 MG PO TABS: 1.0000 | ORAL_TABLET | ORAL | Status: DC | PRN

## 2018-01-18 MED ORDER — FUROSEMIDE 10 MG/ML IJ SOLN
40.0000 mg | Freq: Two times a day (BID) | INTRAMUSCULAR | Status: DC
Start: 2018-01-18 — End: 2018-01-20
  Administered 2018-01-18 – 2018-01-19 (×3): 40 mg via INTRAVENOUS
  Filled 2018-01-18 (×3): qty 4

## 2018-01-18 MED ORDER — FUROSEMIDE 10 MG/ML IJ SOLN
INTRAMUSCULAR | Status: AC
  Filled 2018-01-18: qty 4

## 2018-01-18 MED ORDER — SODIUM CHLORIDE 0.9 % IV SOLN
500.0000 mg | Freq: Once | INTRAVENOUS | Status: AC
  Administered 2018-01-18: 500 mg via INTRAVENOUS
  Filled 2018-01-18: qty 500

## 2018-01-18 MED ORDER — METHYLPREDNISOLONE SODIUM SUCC 40 MG IJ SOLR
40.0000 mg | Freq: Four times a day (QID) | INTRAMUSCULAR | Status: DC
  Administered 2018-01-18: 40 mg via INTRAVENOUS
  Filled 2018-01-18: qty 1

## 2018-01-18 MED ORDER — SENNOSIDES-DOCUSATE SODIUM 8.6-50 MG PO TABS: 1.0000 | ORAL_TABLET | Freq: Every evening | ORAL | Status: DC | PRN

## 2018-01-18 MED ORDER — ASPIRIN EC 81 MG PO TBEC
81.0000 mg | DELAYED_RELEASE_TABLET | Freq: Every day | ORAL | Status: DC
  Administered 2018-01-18 – 2018-01-22 (×5): 81 mg via ORAL
  Filled 2018-01-18 (×5): qty 1

## 2018-01-18 MED ORDER — ARFORMOTEROL TARTRATE 15 MCG/2ML IN NEBU
15.0000 ug | INHALATION_SOLUTION | Freq: Two times a day (BID) | RESPIRATORY_TRACT | Status: DC
  Administered 2018-01-18 – 2018-01-22 (×8): 15 ug via RESPIRATORY_TRACT
  Filled 2018-01-18 (×12): qty 2

## 2018-01-18 MED ORDER — ONDANSETRON HCL 4 MG/2ML IJ SOLN: 4.0000 mg | Freq: Four times a day (QID) | INTRAMUSCULAR | Status: DC | PRN

## 2018-01-18 MED ORDER — SODIUM CHLORIDE 0.9 % IV SOLN: 250.0000 mL | INTRAVENOUS | Status: DC | PRN

## 2018-01-18 MED ORDER — MONTELUKAST SODIUM 10 MG PO TABS
10.0000 mg | ORAL_TABLET | Freq: Every day | ORAL | Status: DC
  Administered 2018-01-18 – 2018-01-21 (×4): 10 mg via ORAL
  Filled 2018-01-18 (×4): qty 1

## 2018-01-18 MED ORDER — SODIUM CHLORIDE 0.9% FLUSH
3.0000 mL | Freq: Two times a day (BID) | INTRAVENOUS | Status: DC
  Administered 2018-01-18 – 2018-01-22 (×8): 3 mL via INTRAVENOUS

## 2018-01-18 MED ORDER — MIRTAZAPINE 15 MG PO TABS
45.0000 mg | ORAL_TABLET | Freq: Every day | ORAL | Status: DC
  Administered 2018-01-18 – 2018-01-21 (×4): 45 mg via ORAL
  Filled 2018-01-18 (×4): qty 3

## 2018-01-18 MED ORDER — BISACODYL 5 MG PO TBEC
5.0000 mg | DELAYED_RELEASE_TABLET | Freq: Every day | ORAL | Status: DC | PRN
  Administered 2018-01-21: 15:00:00 5 mg via ORAL
  Filled 2018-01-18: qty 1

## 2018-01-18 MED ORDER — ONDANSETRON HCL 4 MG PO TABS: 4.0000 mg | ORAL_TABLET | Freq: Four times a day (QID) | ORAL | Status: DC | PRN

## 2018-01-18 MED ORDER — ATORVASTATIN CALCIUM 20 MG PO TABS
40.0000 mg | ORAL_TABLET | Freq: Every day | ORAL | Status: DC
  Administered 2018-01-18 – 2018-01-22 (×5): 40 mg via ORAL
  Filled 2018-01-18 (×5): qty 2

## 2018-01-18 MED ORDER — INSULIN ASPART 100 UNIT/ML ~~LOC~~ SOLN
0.0000 [IU] | Freq: Three times a day (TID) | SUBCUTANEOUS | Status: DC
  Administered 2018-01-18: 3 [IU] via SUBCUTANEOUS
  Administered 2018-01-19: 18:00:00 2 [IU] via SUBCUTANEOUS
  Administered 2018-01-19: 1 [IU] via SUBCUTANEOUS
  Administered 2018-01-19: 2 [IU] via SUBCUTANEOUS
  Administered 2018-01-20: 18:00:00 3 [IU] via SUBCUTANEOUS
  Administered 2018-01-20: 2 [IU] via SUBCUTANEOUS
  Administered 2018-01-20: 1 [IU] via SUBCUTANEOUS
  Administered 2018-01-21: 2 [IU] via SUBCUTANEOUS
  Administered 2018-01-21: 12:00:00 5 [IU] via SUBCUTANEOUS
  Administered 2018-01-21: 17:00:00 2 [IU] via SUBCUTANEOUS
  Administered 2018-01-22: 09:00:00 1 [IU] via SUBCUTANEOUS
  Filled 2018-01-18 (×11): qty 1

## 2018-01-18 MED ORDER — LORATADINE 10 MG PO TABS
10.0000 mg | ORAL_TABLET | Freq: Every day | ORAL | Status: DC
  Administered 2018-01-18 – 2018-01-22 (×5): 10 mg via ORAL
  Filled 2018-01-18 (×6): qty 1

## 2018-01-18 MED ORDER — SODIUM CHLORIDE 0.9% FLUSH: 3.0000 mL | INTRAVENOUS | Status: DC | PRN

## 2018-01-18 MED ORDER — CARVEDILOL 6.25 MG PO TABS
6.2500 mg | ORAL_TABLET | Freq: Two times a day (BID) | ORAL | Status: DC
  Administered 2018-01-18 (×2): 6.25 mg via ORAL
  Filled 2018-01-18 (×2): qty 1

## 2018-01-18 MED ORDER — BUDESONIDE 0.25 MG/2ML IN SUSP
0.2500 mg | Freq: Two times a day (BID) | RESPIRATORY_TRACT | Status: DC
  Administered 2018-01-18 – 2018-01-19 (×2): 0.25 mg via RESPIRATORY_TRACT
  Filled 2018-01-18 (×2): qty 2

## 2018-01-18 MED ORDER — TIOTROPIUM BROMIDE MONOHYDRATE 18 MCG IN CAPS
18.0000 ug | ORAL_CAPSULE | Freq: Every day | RESPIRATORY_TRACT | Status: DC
  Administered 2018-01-18 – 2018-01-22 (×5): 18 ug via RESPIRATORY_TRACT
  Filled 2018-01-18: qty 5

## 2018-01-18 MED ORDER — SENNOSIDES-DOCUSATE SODIUM 8.6-50 MG PO TABS
2.0000 | ORAL_TABLET | Freq: Two times a day (BID) | ORAL | Status: DC
  Administered 2018-01-18 – 2018-01-22 (×9): 2 via ORAL
  Filled 2018-01-18 (×9): qty 2

## 2018-01-18 MED ORDER — ALBUTEROL SULFATE (2.5 MG/3ML) 0.083% IN NEBU
2.5000 mg | INHALATION_SOLUTION | RESPIRATORY_TRACT | Status: DC | PRN
  Administered 2018-01-19 (×2): 2.5 mg via RESPIRATORY_TRACT
  Filled 2018-01-18 (×3): qty 3

## 2018-01-18 MED ORDER — MAGNESIUM SULFATE 2 GM/50ML IV SOLN
2.0000 g | INTRAVENOUS | Status: AC
  Administered 2018-01-18: 2 g via INTRAVENOUS

## 2018-01-18 MED ORDER — ACETAMINOPHEN 650 MG RE SUPP: 650.0000 mg | Freq: Four times a day (QID) | RECTAL | Status: DC | PRN

## 2018-01-18 MED ORDER — HEPARIN SODIUM (PORCINE) 5000 UNIT/ML IJ SOLN
5000.0000 [IU] | Freq: Three times a day (TID) | INTRAMUSCULAR | Status: DC
  Administered 2018-01-18 – 2018-01-22 (×12): 5000 [IU] via SUBCUTANEOUS
  Filled 2018-01-18 (×12): qty 1

## 2018-01-18 MED ORDER — GUAIFENESIN-DM 100-10 MG/5ML PO SYRP
5.0000 mL | ORAL_SOLUTION | ORAL | Status: DC | PRN
  Filled 2018-01-18: qty 5

## 2018-01-18 MED ORDER — SPIRONOLACTONE 12.5 MG HALF TABLET
12.5000 mg | ORAL_TABLET | Freq: Every day | ORAL | Status: DC
  Administered 2018-01-20 – 2018-01-22 (×3): 12.5 mg via ORAL
  Filled 2018-01-18 (×5): qty 1

## 2018-01-18 MED ORDER — ALBUTEROL SULFATE (2.5 MG/3ML) 0.083% IN NEBU: 2.5000 mg | INHALATION_SOLUTION | RESPIRATORY_TRACT | Status: DC | PRN

## 2018-01-18 MED ORDER — IPRATROPIUM-ALBUTEROL 0.5-2.5 (3) MG/3ML IN SOLN
3.0000 mL | Freq: Once | RESPIRATORY_TRACT | Status: AC
  Administered 2018-01-18: 3 mL via RESPIRATORY_TRACT

## 2018-01-18 MED ORDER — MAGNESIUM SULFATE 2 GM/50ML IV SOLN
INTRAVENOUS | Status: AC
  Filled 2018-01-18: qty 50

## 2018-01-18 MED ORDER — ALBUTEROL SULFATE (2.5 MG/3ML) 0.083% IN NEBU
5.0000 mg | INHALATION_SOLUTION | Freq: Once | RESPIRATORY_TRACT | Status: AC
  Administered 2018-01-18: 5 mg via RESPIRATORY_TRACT

## 2018-01-18 MED ORDER — METHYLPREDNISOLONE SODIUM SUCC 125 MG IJ SOLR
INTRAMUSCULAR | Status: AC
  Filled 2018-01-18: qty 2

## 2018-01-18 MED ORDER — FUROSEMIDE 10 MG/ML IJ SOLN
40.0000 mg | Freq: Once | INTRAMUSCULAR | Status: AC
  Administered 2018-01-18: 40 mg via INTRAVENOUS

## 2018-01-18 MED ORDER — FUROSEMIDE 10 MG/ML IJ SOLN: 20.0000 mg | Freq: Two times a day (BID) | INTRAMUSCULAR | Status: DC

## 2018-01-18 NOTE — ED Provider Notes (Signed)
[facility] Emergency Department Provider Note  ____________________________________________  Time seen: Approximately 9:58 AM  I have reviewed the triage vital signs and the nursing notes.   HISTORY  Chief Complaint Respiratory Distress    HPI Angela Pham is a 77 y.o. female with a history of diabetes hypertension chf morbid obesity and COPD who presents with respiratory distress.  The patient was recently seen in the ED 2 days ago for shortness of breath and treated for COPD exacerbation. She was able to be discharged home at that day, but this morning she reports that she rapidly got much worse. She tried using her nebulizer at home without relief. EMS arrived and noted her oxygen saturation to be 80% on room air and started her on CPAP. They gave one DuoNeb en route. Patient denies chest pain. She denies dizziness.  Shortness of breath is constant, severe, no aggravating or alleviating factors at this time.      Past Medical History:  Diagnosis Date  . Asthma   . Chronic combined systolic and diastolic CHF (congestive heart failure) ([place])    a. TTE 02/2017: EF 45-50%, basal amd midanteroseptal HK b. 05/2017: echo showing EF of 35-40%, mild AI, moderate MR, and mildly dilated LA.   Marland Kitchen Coronary artery disease, non-occlusive    a. LHC 02/2017 normal coronary arteries  . Hyperlipidemia   . Hypertension   . Morbid obesity ([place])   . OSA (obstructive sleep apnea)    a. On CPAP w/ O2 bleed in.  . Pulmonary hypertension ([place])   . Type II diabetes mellitus [facility])      Patient Active Problem List   Diagnosis Date Noted  . CHF (congestive heart failure) ([place]) 11/11/2017  . Anemia 09/26/2017  . Monoclonal gammopathy 09/26/2017  . Acute respiratory failure ([place]) 08/24/2017  . Acute URI 08/20/2017  . Acute respiratory failure with hypoxia ([place]) 08/03/2017  . DNR (do not resuscitate) 07/02/2017  . Respiratory failure ([place]) 06/27/2017  . Chronic diastolic  heart failure ([place]) 06/24/2017  . Asthma 06/24/2017  . Diabetes ([place]) 06/24/2017  . Essential hypertension 06/23/2017  . Hyperlipidemia, unspecified 06/23/2017  . CKD (chronic kidney disease) stage 3, GFR 30-59 ml/min (HCC) 06/23/2017  . Pressure injury of skin 06/12/2017  . COPD exacerbation ([place]) 05/28/2017  . SOB (shortness of breath)   . Chronic combined systolic and diastolic heart failure ([place]) 03/07/2017     Past Surgical History:  Procedure Laterality Date  . c-section    . RIGHT/LEFT HEART CATH AND CORONARY ANGIOGRAPHY N/A 03/09/2017   Procedure: Right/Left Heart Cath and Coronary Angiography;  Surgeon: Minna Merritts, MD;  Location: [place] [facility];  Service: Cardiovascular;  Laterality: N/A;     Prior to Admission medications   Medication Sig Start Date End Date Taking? Authorizing Provider  aspirin EC 81 MG EC tablet Take 1 tablet (81 mg total) by mouth daily. 03/12/17  Yes Mody, Ulice Bold, MD  atorvastatin (LIPITOR) 40 MG tablet Take 40 mg by mouth daily.   Yes [provider]  budesonide (PULMICORT) 0.25 MG/2ML nebulizer solution Take 2 mLs (0.25 mg total) by nebulization 2 (two) times daily. DX: COPD J44.9 10/06/17  Yes Wilhelmina Mcardle, MD  carvedilol (COREG) 6.25 MG tablet Take 1 tablet (6.25 mg total) by mouth daily. Patient taking differently: Take 6.25 mg by mouth 2 (two) times daily.  11/12/17  Yes Mody, Ulice Bold, MD  levocetirizine (XYZAL) 5 MG tablet Take 5 mg by mouth daily.  03/18/17  Yes [provider]  metFORMIN (GLUCOPHAGE) 1000 MG tablet Take 1 tablet by mouth 2 (two) times daily.    Yes [provider]  mirtazapine (REMERON) 45 MG tablet Take 1 tablet by mouth at bedtime.   Yes [provider]  montelukast (SINGULAIR) 10 MG tablet Take 10 mg by mouth at bedtime.   Yes [provider]  Multiple Vitamins-Minerals (EYE VITAMINS) CAPS Take 1 tablet by mouth 2 (two) times daily.    Yes [provider]   salmeterol (SEREVENT DISKUS) 50 MCG/DOSE diskus inhaler Inhale 1 puff into the lungs 2 (two) times daily. 10/07/17 10/07/18 Yes Wilhelmina Mcardle, MD  senna-docusate (SENOKOT-S) 8.6-50 MG tablet Take 2 tablets by mouth 2 (two) times daily. 07/04/17  Yes Sudini, Alveta Heimlich, MD  spironolactone (ALDACTONE) 25 MG tablet Take 0.5 tablets (12.5 mg total) by mouth daily. 06/23/17  Yes Strader, [place], PA-C  tiotropium (SPIRIVA HANDIHALER) 18 MCG inhalation capsule Place 1 capsule (18 mcg total) into inhaler and inhale daily. 07/04/17 07/04/18 Yes Sudini, Alveta Heimlich, MD  torsemide (DEMADEX) 20 MG tablet Take 1 tablet (20 mg total) by mouth 2 (two) times daily. 11/12/17  Yes Mody, Ulice Bold, MD  arformoterol (BROVANA) 15 MCG/2ML NEBU Take 2 mLs (15 mcg total) 2 (two) times daily by nebulization. DX: COPD J44.9 Patient not taking: Reported on 01/18/2018 08/08/17   Wilhelmina Mcardle, MD  potassium chloride SA (K-DUR,KLOR-CON) 20 MEQ tablet Take 1 tablet (20 mEq total) by mouth daily. Patient not taking: Reported on 01/18/2018 08/23/17   Fritzi Mandes, MD     Allergies Ace inhibitors and Shellfish allergy   Family History  Problem Relation Age of Onset  . CAD Father   . Colon cancer Sister   . Leukemia Brother   . Throat cancer Brother   . Cervical cancer Sister     Social History Social History   Tobacco Use  . Smoking status: Former Smoker    Packs/day: 0.30    Years: 20.00    Pack years: 6.00    Types: Cigarettes  . Smokeless tobacco: Never Used  Substance Use Topics  . Alcohol use: No  . Drug use: No    Review of Systems  Constitutional:   No fever or chills.  ENT:   No sore throat. No rhinorrhea. Cardiovascular:   No chest pain or syncope. Respiratory:   positive as above shortness of breath. Gastrointestinal:   Negative for abdominal pain, vomiting and diarrhea.  Musculoskeletal:   Negative for focal pain or swelling All other systems reviewed and are negative except as documented above in ROS  and HPI.  ____________________________________________   PHYSICAL EXAM:  VITAL SIGNS: ED Triage Vitals  Enc Vitals Group     BP 01/18/18 0750 (!) 158/81     Pulse Rate 01/18/18 0750 (!) 144     Resp 01/18/18 0750 (!) 31     Temp 01/18/18 0750 (!) 96.4 F (35.8 C)     Temp Source 01/18/18 0750 Axillary     SpO2 01/18/18 0746 (!) 80 %     Weight 01/18/18 0751 160 lb (72.6 kg)     Height 01/18/18 0751 5' (1.524 m)     Head Circumference --      Peak Flow --      Pain Score 01/18/18 0750 0     Pain Loc --      Pain Edu? --      Excl. in [place]? --     Vital signs  reviewed, nursing assessments reviewed.   Constitutional:   Alert and oriented. moderate respiratory distress Eyes:   Conjunctivae are normal. EOMI. PERRL. ENT      Head:   Normocephalic and atraumatic.      Nose:   No congestion/rhinnorhea.       Mouth/Throat:   MMM, no pharyngeal erythema. No peritonsillar mass.       Neck:   No meningismus. Full ROM.no JVD. Thyroid nonpalpable Hematological/Lymphatic/Immunilogical:   No cervical lymphadenopathy. Cardiovascular:   tachycardia heart rate 140. Symmetric bilateral radial and DP pulses.  No murmurs.  Respiratory:   tachypnea and increased work of breathing with accessory muscle use. Diminished air entry in all lung fields. Prolonged expiratory phase with diffuse expiratory wheezing. No focal crackles. Not suggestive of pneumothorax Gastrointestinal:   Soft and nontender. Non distended. There is no CVA tenderness.  No rebound, rigidity, or guarding. Musculoskeletal:   Normal range of motion in all extremities. No joint effusions.  No lower extremity tenderness.  trace pedal edema. Neurologic:   Normal speech and language.  Motor grossly intact. No acute focal neurologic deficits are appreciated.  Skin:    Skin is warm, dry and intact. No rash noted.  No petechiae, purpura, or bullae.  ____________________________________________    LABS (pertinent  positives/negatives) (all labs ordered are listed, but only abnormal results are displayed) Labs Reviewed  CBC WITH DIFFERENTIAL/PLATELET - Abnormal; Notable for the following components:      Result Value   WBC 20.0 (*)    RBC 3.73 (*)    Hemoglobin 11.3 (*)    HCT 33.9 (*)    RDW 16.0 (*)    Neutro Abs 13.7 (*)    Lymphs Abs 4.9 (*)    Monocytes Absolute 1.0 (*)    Basophils Absolute 0.2 (*)    All other components within normal limits  BLOOD GAS, VENOUS - Abnormal; Notable for the following components:   pH, Ven 7.12 (*)    pCO2, Ven 73 (*)    All other components within normal limits  BASIC METABOLIC PANEL - Abnormal; Notable for the following components:   Glucose, Bld 295 (*)    BUN 63 (*)    Creatinine, Ser 2.18 (*)    Calcium 8.5 (*)    GFR calc non Af Amer 21 (*)    GFR calc Af Amer 24 (*)    All other components within normal limits  TROPONIN I - Abnormal; Notable for the following components:   Troponin I 0.04 (*)    All other components within normal limits  FIBRIN DERIVATIVES D-DIMER ([facility] ONLY)   ____________________________________________   EKG  interpreted by me Sinus tachycardia rate 142, left axis, slightly prolonged QTC of 529 ms. Left bundle-branch block. No acute ischemic changes.  ____________________________________________    [id number]  Dg Chest Portable 1 View  Result Date: 01/18/2018 CLINICAL DATA:  Respiratory distress EXAM: PORTABLE CHEST 1 VIEW COMPARISON:  January 16, 2018 FINDINGS: Lungs are somewhat hyperexpanded. There is slight bibasilar interstitial edema. There is mild atelectasis in the right base with minimal right pleural effusion. Lungs elsewhere are clear. Heart is mildly enlarged with pulmonary venous hypertension. No adenopathy. No bone lesions. IMPRESSION: Pulmonary vascular congestion with slight bibasilar interstitial edema. Minimal right pleural effusion with mild right base atelectasis. No consolidation. No adenopathy  evident. Electronically Signed   By: Lowella Grip III M.D.   On: 01/18/2018 08:01    ____________________________________________   PROCEDURES .Critical Care Performed by: Carrie Mew,  MD Authorized by: Carrie Mew, MD   Critical care provider statement:    Critical care time (minutes):  35   Critical care time was exclusive of:  Separately billable procedures and treating other patients   Critical care was necessary to treat or prevent imminent or life-threatening deterioration of the following conditions:  Respiratory failure   Critical care was time spent personally by me on the following activities:  Development of treatment plan with patient or surrogate, discussions with consultants, evaluation of patient's response to treatment, examination of patient, obtaining history from patient or surrogate, ordering and performing treatments and interventions, ordering and review of laboratory studies, ordering and review of radiographic studies, pulse oximetry, re-evaluation of patient's condition and review of old charts    ____________________________________________  DIFFERENTIAL DIAGNOSIS   COPD exacerbation, CHF exacerbation, pulmonary edema, pneumothorax, pulmonary embolism, pneumonia  CLINICAL IMPRESSION / ASSESSMENT AND PLAN / ED COURSE  Pertinent labs & imaging results that were available during my care of the patient were reviewed by me and considered in my medical decision making (see chart for details).      Clinical Course as of Jan 18 957  Wed Jan 18, 2018  0749 Pt p/w respiratory distress, likely COPD exacerbation. Differential also includes PE or pneumonia. Patient is not septic at this time. We'll follow-up labs and chest x-ray. Would consider CT angiogram of the chest given worsening symptoms after just being treated 2 days ago, but review of electronic medical records shows renal insufficiency in the past that would likely preclude contrast-enhanced  CT imaging.   [PS]  [id number] chest x-ray consistent with pulmonary edema and congestive heart failure. Continue medications as above. I'll add ceftriaxone and azithromycin for mortality benefit given underlying structural lung disease.  DG Chest Portable 1 View [PS]  [id number] High wbc without pronounced left shift. Suspect steroid and stress related, not indicative of underlying bacterial infection.   WBC(!): 20.0 [PS]  2683 Still awaiting labs.    [PS]  [id number] Stable CKD. Renal function too poor for CTA chest to eval for PE. HR has improved significantly, and I have low suspicion at this point. I will check a d-dimer and plan to admit.   Creatinine(!): 2.18 [PS]    Clinical Course User Index [PS] Carrie Mew, MD     ----------------------------------------- 10:02 AM on 01/18/2018 -----------------------------------------  Maintaining oxygen saturation 100% on BiPAP.  ____________________________________________   FINAL CLINICAL IMPRESSION(S) / ED DIAGNOSES    Final diagnoses:  Acute on chronic respiratory failure with hypercapnia ([place])  Respiratory distress     ED Discharge Orders    None      Portions of this note were generated with dragon dictation software. Dictation errors may occur despite best attempts at proofreading.    Carrie Mew, MD 01/18/18 1002

## 2018-01-18 NOTE — H&P (Addendum)
[facility] NAME: Angela Pham    MR#:  [id number]  DATE OF BIRTH:  [birth year]  DATE OF ADMISSION:  01/18/2018  PRIMARY CARE PHYSICIAN: Gareth Morgan, MD   REQUESTING/REFERRING PHYSICIAN: Dr. Joni Fears.  CHIEF COMPLAINT:   Chief Complaint  Patient presents with  . Respiratory Distress   Cough, wheezing and shortness space for several days. HISTORY OF PRESENT ILLNESS:  Angela Pham  is a 77 y.o. female with a known history of multiple medical problems as below and the recurrent admission.  The patient presented to the ED base above chief complaints.  She has had these symptoms for several days, which has been worsening today.  She was seen in ED 2 days ago for shortness of breath and the treated for COPD exacerbation.  She use home nebulizer without any improvement.  She was found in the respiratory distress, wheezing and hypoxia at the 80% the room air and put on CPAP.  She is treated with nebulizer and put on BiPAP in ED. chest x-ray showed pulmonary edema.  PAST MEDICAL HISTORY:   Past Medical History:  Diagnosis Date  . Asthma   . Chronic combined systolic and diastolic CHF (congestive heart failure) ([place])    a. TTE 02/2017: EF 45-50%, basal amd midanteroseptal HK b. 05/2017: echo showing EF of 35-40%, mild AI, moderate MR, and mildly dilated LA.   Marland Kitchen Coronary artery disease, non-occlusive    a. LHC 02/2017 normal coronary arteries  . Hyperlipidemia   . Hypertension   . Morbid obesity ([place])   . OSA (obstructive sleep apnea)    a. On CPAP w/ O2 bleed in.  . Pulmonary hypertension ([place])   . Type II diabetes mellitus ([place])     PAST SURGICAL HISTORY:   Past Surgical History:  Procedure Laterality Date  . c-section    . RIGHT/LEFT HEART CATH AND CORONARY ANGIOGRAPHY N/A 03/09/2017   Procedure: Right/Left Heart Cath and Coronary Angiography;  Surgeon: Minna Merritts, MD;  Location: [facility];  Service:  Cardiovascular;  Laterality: N/A;    SOCIAL HISTORY:   Social History   Tobacco Use  . Smoking status: Former Smoker    Packs/day: 0.30    Years: 20.00    Pack years: 6.00    Types: Cigarettes  . Smokeless tobacco: Never Used  Substance Use Topics  . Alcohol use: No    FAMILY HISTORY:   Family History  Problem Relation Age of Onset  . CAD Father   . Colon cancer Sister   . Leukemia Brother   . Throat cancer Brother   . Cervical cancer Sister     DRUG ALLERGIES:   Allergies  Allergen Reactions  . Ace Inhibitors Anaphylaxis    Tongue swelling  . Shellfish Allergy Hives and Swelling    Swelling around face and mouth     REVIEW OF SYSTEMS:   Review of Systems  Constitutional: Positive for malaise/fatigue. Negative for chills and fever.  HENT: Negative for sore throat.   Eyes: Negative for blurred vision and double vision.  Respiratory: Positive for cough, sputum production, shortness of breath and wheezing. Negative for hemoptysis and stridor.   Cardiovascular: Positive for orthopnea and leg swelling. Negative for chest pain and palpitations.  Gastrointestinal: Negative for abdominal pain, blood in stool, diarrhea, melena, nausea and vomiting.  Genitourinary: Negative for dysuria, flank pain and hematuria.  Musculoskeletal: Negative for back pain and joint pain.  Skin:  Negative for rash.  Neurological: Negative for dizziness, sensory change, focal weakness, seizures, loss of consciousness, weakness and headaches.  Endo/Heme/Allergies: Negative for polydipsia.  Psychiatric/Behavioral: Negative for depression. The patient is not nervous/anxious.     MEDICATIONS AT HOME:   Prior to Admission medications   Medication Sig Start Date End Date Taking? Authorizing Provider  aspirin EC 81 MG EC tablet Take 1 tablet (81 mg total) by mouth daily. 03/12/17  Yes Mody, Ulice Bold, MD  atorvastatin (LIPITOR) 40 MG tablet Take 40 mg by mouth daily.   Yes [provider]    budesonide (PULMICORT) 0.25 MG/2ML nebulizer solution Take 2 mLs (0.25 mg total) by nebulization 2 (two) times daily. DX: COPD J44.9 10/06/17  Yes Wilhelmina Mcardle, MD  carvedilol (COREG) 6.25 MG tablet Take 1 tablet (6.25 mg total) by mouth daily. Patient taking differently: Take 6.25 mg by mouth 2 (two) times daily.  11/12/17  Yes Mody, Ulice Bold, MD  levocetirizine (XYZAL) 5 MG tablet Take 5 mg by mouth daily.  03/18/17  Yes [provider]  metFORMIN (GLUCOPHAGE) 1000 MG tablet Take 1 tablet by mouth 2 (two) times daily.    Yes [provider]  mirtazapine (REMERON) 45 MG tablet Take 1 tablet by mouth at bedtime.   Yes [provider]  montelukast (SINGULAIR) 10 MG tablet Take 10 mg by mouth at bedtime.   Yes [provider]  Multiple Vitamins-Minerals (EYE VITAMINS) CAPS Take 1 tablet by mouth 2 (two) times daily.    Yes [provider]  salmeterol (SEREVENT DISKUS) 50 MCG/DOSE diskus inhaler Inhale 1 puff into the lungs 2 (two) times daily. 10/07/17 10/07/18 Yes Wilhelmina Mcardle, MD  senna-docusate (SENOKOT-S) 8.6-50 MG tablet Take 2 tablets by mouth 2 (two) times daily. 07/04/17  Yes Sudini, Alveta Heimlich, MD  spironolactone (ALDACTONE) 25 MG tablet Take 0.5 tablets (12.5 mg total) by mouth daily. 06/23/17  Yes Strader, [place], PA-C  tiotropium (SPIRIVA HANDIHALER) 18 MCG inhalation capsule Place 1 capsule (18 mcg total) into inhaler and inhale daily. 07/04/17 07/04/18 Yes Sudini, Alveta Heimlich, MD  torsemide (DEMADEX) 20 MG tablet Take 1 tablet (20 mg total) by mouth 2 (two) times daily. 11/12/17  Yes Mody, Ulice Bold, MD  arformoterol (BROVANA) 15 MCG/2ML NEBU Take 2 mLs (15 mcg total) 2 (two) times daily by nebulization. DX: COPD J44.9 Patient not taking: Reported on 01/18/2018 08/08/17   Wilhelmina Mcardle, MD  potassium chloride SA (K-DUR,KLOR-CON) 20 MEQ tablet Take 1 tablet (20 mEq total) by mouth daily. Patient not taking: Reported on 01/18/2018 08/23/17   Fritzi Mandes, MD       VITAL SIGNS:  Blood pressure 91/64, pulse (!) 104, temperature (!) 96.4 F (35.8 C), temperature source Axillary, resp. rate (!) 22, height 5' (1.524 m), weight 160 lb (72.6 kg), SpO2 100 %.  PHYSICAL EXAMINATION:  Physical Exam  GENERAL:  77 y.o.-year-old patient lying in the bed with no acute distress.  EYES: Pupils equal, round, reactive to light and accommodation. No scleral icterus. Extraocular muscles intact.  HEENT: Head atraumatic, normocephalic. Oropharynx and nasopharynx clear.  NECK:  Supple, no jugular venous distention. No thyroid enlargement, no tenderness.  LUNGS: Very diminished breath sounds bilaterally, no wheezing, mild rales,no rhonchi or crepitation. No use of accessory muscles of respiration.  CARDIOVASCULAR: S1, S2 normal. No murmurs, rubs, or gallops.  ABDOMEN: Soft, nontender, nondistended. Bowel sounds present. No organomegaly or mass.  EXTREMITIES: No pedal edema, cyanosis, or clubbing.  Trace leg edema. NEUROLOGIC: Cranial nerves  II through XII are intact. Muscle strength 4/5 in all extremities. Sensation intact. Gait not checked.  PSYCHIATRIC: The patient is alert and oriented x 3.  SKIN: No obvious rash, lesion, or ulcer.   LABORATORY PANEL:   CBC Recent Labs  Lab 01/18/18 0751  WBC 20.0*  HGB 11.3*  HCT 33.9*  PLT 353   ------------------------------------------------------------------------------------------------------------------  Chemistries  Recent Labs  Lab 01/16/18 0733 01/18/18 0751  NA 139 136  K 4.1 5.0  CL 103 104  CO2 26 23  GLUCOSE 129* 295*  BUN 66* 63*  CREATININE 2.11* 2.18*  CALCIUM 8.4* 8.5*  AST 19  --   ALT 7*  --   ALKPHOS 47  --   BILITOT 0.7  --    ------------------------------------------------------------------------------------------------------------------  Cardiac Enzymes Recent Labs  Lab 01/18/18 0751  TROPONINI 0.04*    ------------------------------------------------------------------------------------------------------------------  RADIOLOGY:  Dg Chest Portable 1 View  Result Date: 01/18/2018 CLINICAL DATA:  Respiratory distress EXAM: PORTABLE CHEST 1 VIEW COMPARISON:  January 16, 2018 FINDINGS: Lungs are somewhat hyperexpanded. There is slight bibasilar interstitial edema. There is mild atelectasis in the right base with minimal right pleural effusion. Lungs elsewhere are clear. Heart is mildly enlarged with pulmonary venous hypertension. No adenopathy. No bone lesions. IMPRESSION: Pulmonary vascular congestion with slight bibasilar interstitial edema. Minimal right pleural effusion with mild right base atelectasis. No consolidation. No adenopathy evident. Electronically Signed   By: Lowella Grip III M.D.   On: 01/18/2018 08:01      IMPRESSION AND PLAN:   Acute on chronic respiratory failure with hypoxia due to COPD exacerbation and acute on chronic diastolic CHF. The patient will be admitted to stepdown unit. Continue BiPAP, follow-up intensivist recommendation.  Acute on chronic diastolic CHF. Start CHF protocol, Lasix IV every 12 hours, cardiology consult. Hold torsemide, continue spironolactone and Coreg if blood pressure allows.  COPD exacerbation. Continue IV Solu-Medrol, albuterol every 6 hours.  Robitussin as needed.  Elevated troponin, demanding ischemia due to above.  Continue aspirin.  Leukocytosis.  Unclear etiology, follow-up CBC and urinalysis.  Pulmonary hypertension.  Follow-up intensivist.  CKD stage IV.  Follow-up BMP will hold Lasix.  Diabetes.  Start sliding scale.  Discussed with Dr. Alva Garnet. All the records are reviewed and case discussed with ED provider. Management plans discussed with the patient, family and they are in agreement.  CODE STATUS: DNR. TOTAL CRITICAL TIME TAKING CARE OF THIS PATIENT: 58 minutes.    Demetrios Loll M.D on 01/18/2018 at 10:33  AM  Between 7am to 6pm - Pager - [phone]  After 6pm go to [email or web address] - Proofreader  [facility]  Office  [phone]  CC: Primary care physician; Gareth Morgan, MD   Note: This dictation was prepared with Dragon dictation along with smaller phrase technology. Any transcriptional errors that result from this process are unin

## 2018-01-18 NOTE — ED Notes (Signed)
Awaiting rocephin to arrive from pharmacy

## 2018-01-18 NOTE — ED Notes (Signed)
Emailed pharmacy to send rocephin

## 2018-01-18 NOTE — ED Triage Notes (Signed)
Pt arrived via ems for respiratory distress - pt has hx of CHF and asthma - she was in ED 2 days ago for same - pt took nebulizer and inhalers with no relief - EMS gave duoneb x1 with no relief

## 2018-01-18 NOTE — Care Management (Signed)
RNCM spoke with [facility] [phone].  [facility] goes to [facility] twice[facility] to see providers. She's on chronic 2 L [place] at home but per [facility], "she's never seen her with her Oxygen on".  Miley later confirmed that O2 at home is PRN. Miley does not see that patient is on CPAP/BiPAP at home.  Patient lives with niece and at baseline independent with ADLs. [facility] will follow patient's discharge needs and arrange.

## 2018-01-18 NOTE — Consult Note (Signed)
Patient admitted via ED on BiPAP with pulmonary edema pattern on CXR (mild).  She has a history of CKD and CHF.  Increasing ankle and pedal edema over the past couple of days.  Upon arrival to the stepdown unit, I removed the BiPAP mask and placed on nasal cannula oxygen.  She is tolerating this well.  Cardiology is seeing in evaluation.  B natruretic peptide is greater than 800.  Her exam reveals no wheezes, bibasilar crackles, no lower extremity edema  She has a history of chronic obstructive asthma but her current respiratory illness does not appear to be related to airways disease and is more easily attributed to pulmonary edema as noted on chest x-ray and exam.  I have discontinued systemic steroids.  She is chronically on nebulized steroids and long-acting beta agonist therapy.  These medications should be continued during her hospitalization.  She also uses Spiriva chronically  I have reviewed her medications.  I have changed her BiPAP order to as needed.  If she does well off of BiPAP through the night, she should be transferred to MedSurg floor in a.m. 04/25   Merton Border, MD PCCM service Mobile [phone] Pager [phone] 01/18/2018 4:16 PM

## 2018-01-18 NOTE — ED Notes (Signed)
Attempt was made by off-going RN to call report, but RN was unavailable in the ICU. Patient is calm, belongings were packed up. Patient remains on bi-pap.

## 2018-01-18 NOTE — ED Notes (Addendum)
Pt requesting to come off on bipap - discussed pros/cons with pt and she agreed to leave bipap in place

## 2018-01-18 NOTE — Consult Note (Signed)
Cardiology Consultation:   Patient ID: Angela Pham; [id number]; August 27, 1941   Admit date: 01/18/2018 Date of Consult: 01/18/2018  Primary Care Provider: Gareth Morgan, MD Primary Cardiologist: Fletcher Anon   Patient Profile:   Angela Pham is a 77 y.o. female with a hx of nonobstructive CAD by [facility] 02/2017, chronic combined CHF, PAH, COPD, HTN, asthma, DM2, and HLD  who is being seen today for the evaluation of SOB at the request of Dr. Bridgett Larsson.  History of Present Illness:   [name] was previously evaluated in the setting of CHF and dyspnea in 02/2017 with echo at that time revealing an EF of 45-50%. Cath showed normall coronary arteries and PAH. Follow up echo in 05/2017, in the setting of repeat admission for respiratory failure, showed further depression of LV function, with an EF of 35-40%. She was admitted twice for respiratory failure in late November. On the first admission she was treated for volume overload and bumped her SCr with IV Lasix. It was ultimately felt that presentation was more consistent with AECOPD/asthma. Following discharge, she was readmitted the following day and again treated for COPD flare. She was recently admitted in 10/2017 with acute on chronic respiratory failure due to AECOPD.   Patient was recently seen in the ED on 4/22 with 1 day history of increased SOB. CXR at that time showed low-grade CHF. Labs at that time showed a BNP of 319, troponin 0.05, WBC 13.7, HGB 10.5, SCr 2.11. She was discharged with outpatient follow up. On 4/23 she began to note bilateral pedal edema with continued worsening SOB and wheezing. Never with cough or chest pain. No palpitations. She reports compliance with all medications and diet. She has been going to [facility] weekly with good BPs per her report. She is uncertain of an inciting event.   Upon the patient's arrival to [facility] they were found to have BP 158/81, HR 144 bpm, temp 96.4, oxygen saturation 80% on room air, weight 160 pounds  (down three pounds from her discharge in 10/2017). EKG as below, CXR showed pulmonary vascular congestion with slight bibasilar interstitial edema with minimal right pleural effusion with mild right base atelectasis. Labs showed troponin 0.04, BUN/SCr 63/2.18, Na 136, K+ 5.0, glucose 295, WBC 20, HGB 11.3, Mg++ 2.1. Upon admission she was placed on BiPAP given her hypoxia and received nebulizers, steroids, azithromycin and Rocephin along with IV Lasix 40 mg x 1. Currently, remains on BiPAP. Documented UOP of 40 mL.   Past Medical History:  Diagnosis Date  . Asthma   . Chronic combined systolic and diastolic CHF (congestive heart failure) ([place])    a. TTE 02/2017: EF 45-50%, basal amd midanteroseptal HK b. 05/2017: echo showing EF of 35-40%, mild AI, moderate MR, and mildly dilated LA.   Marland Kitchen Coronary artery disease, non-occlusive    a. LHC 02/2017 normal coronary arteries  . Hyperlipidemia   . Hypertension   . Morbid obesity ([place])   . OSA (obstructive sleep apnea)    a. On CPAP w/ O2 bleed in.  . Pulmonary hypertension ([place])   . Type II diabetes mellitus ([place])     Past Surgical History:  Procedure Laterality Date  . c-section    . RIGHT/LEFT HEART CATH AND CORONARY ANGIOGRAPHY N/A 03/09/2017   Procedure: Right/Left Heart Cath and Coronary Angiography;  Surgeon: Minna Merritts, MD;  Location: [place] [facility];  Service: Cardiovascular;  Laterality: N/A;     Home Meds: Prior to Admission medications  Medication Sig Start Date End Date Taking? Authorizing Provider  aspirin EC 81 MG EC tablet Take 1 tablet (81 mg total) by mouth daily. 03/12/17  Yes Mody, Ulice Bold, MD  atorvastatin (LIPITOR) 40 MG tablet Take 40 mg by mouth daily.   Yes [provider]  budesonide (PULMICORT) 0.25 MG/2ML nebulizer solution Take 2 mLs (0.25 mg total) by nebulization 2 (two) times daily. DX: COPD J44.9 10/06/17  Yes Wilhelmina Mcardle, MD  carvedilol (COREG) 6.25 MG tablet Take 1 tablet (6.25 mg total) by  mouth daily. Patient taking differently: Take 6.25 mg by mouth 2 (two) times daily.  11/12/17  Yes Mody, Ulice Bold, MD  levocetirizine (XYZAL) 5 MG tablet Take 5 mg by mouth daily.  03/18/17  Yes [provider]  metFORMIN (GLUCOPHAGE) 1000 MG tablet Take 1 tablet by mouth 2 (two) times daily.    Yes [provider]  mirtazapine (REMERON) 45 MG tablet Take 1 tablet by mouth at bedtime.   Yes [provider]  montelukast (SINGULAIR) 10 MG tablet Take 10 mg by mouth at bedtime.   Yes [provider]  Multiple Vitamins-Minerals (EYE VITAMINS) CAPS Take 1 tablet by mouth 2 (two) times daily.    Yes [provider]  salmeterol (SEREVENT DISKUS) 50 MCG/DOSE diskus inhaler Inhale 1 puff into the lungs 2 (two) times daily. 10/07/17 10/07/18 Yes Wilhelmina Mcardle, MD  senna-docusate (SENOKOT-S) 8.6-50 MG tablet Take 2 tablets by mouth 2 (two) times daily. 07/04/17  Yes Sudini, Alveta Heimlich, MD  spironolactone (ALDACTONE) 25 MG tablet Take 0.5 tablets (12.5 mg total) by mouth daily. 06/23/17  Yes Strader, [place], PA-C  tiotropium (SPIRIVA HANDIHALER) 18 MCG inhalation capsule Place 1 capsule (18 mcg total) into inhaler and inhale daily. 07/04/17 07/04/18 Yes Sudini, Alveta Heimlich, MD  torsemide (DEMADEX) 20 MG tablet Take 1 tablet (20 mg total) by mouth 2 (two) times daily. 11/12/17  Yes Mody, Ulice Bold, MD  arformoterol (BROVANA) 15 MCG/2ML NEBU Take 2 mLs (15 mcg total) 2 (two) times daily by nebulization. DX: COPD J44.9 Patient not taking: Reported on 01/18/2018 08/08/17   Wilhelmina Mcardle, MD  potassium chloride SA (K-DUR,KLOR-CON) 20 MEQ tablet Take 1 tablet (20 mEq total) by mouth daily. Patient not taking: Reported on 01/18/2018 08/23/17   Fritzi Mandes, MD    Inpatient Medications: Scheduled Meds: . arformoterol  15 mcg Nebulization BID  . aspirin EC  81 mg Oral Daily  . atorvastatin  40 mg Oral Daily  . budesonide  0.25 mg Nebulization BID  . carvedilol  6.25 mg Oral BID  .  furosemide  20 mg Intravenous Q12H  . heparin  5,000 Units Subcutaneous Q8H  . insulin aspart  0-5 Units Subcutaneous QHS  . insulin aspart  0-9 Units Subcutaneous TID WC  . loratadine  10 mg Oral Daily  . methylPREDNISolone sodium succinate      . methylPREDNISolone (SOLU-MEDROL) injection  40 mg Intravenous Q6H  . mirtazapine  45 mg Oral QHS  . montelukast  10 mg Oral QHS  . senna-docusate  2 tablet Oral BID  . sodium chloride flush  3 mL Intravenous Q12H  . spironolactone  12.5 mg Oral Daily  . tiotropium  18 mcg Inhalation Daily   Continuous Infusions: . sodium chloride     PRN Meds: sodium chloride, acetaminophen **OR** acetaminophen, albuterol, albuterol, bisacodyl, guaiFENesin-dextromethorphan, HYDROcodone-acetaminophen, ondansetron **OR** ondansetron (ZOFRAN) IV, senna-docusate, sodium chloride flush  Allergies:   Allergies  Allergen Reactions  . Ace Inhibitors Anaphylaxis  Tongue swelling  . Shellfish Allergy Hives and Swelling    Swelling around face and mouth     Social History:   Social History   Socioeconomic History  . Marital status: Widowed    Spouse name: Not on file  . Number of children: Not on file  . Years of education: Not on file  . Highest education level: Not on file  Occupational History  . Occupation: retired  Scientific laboratory technician  . Financial resource strain: Not on file  . Food insecurity:    Worry: Not on file    Inability: Not on file  . Transportation needs:    Medical: Not on file    Non-medical: Not on file  Tobacco Use  . Smoking status: Former Smoker    Packs/day: 0.30    Years: 20.00    Pack years: 6.00    Types: Cigarettes  . Smokeless tobacco: Never Used  Substance and Sexual Activity  . Alcohol use: No  . Drug use: No  . Sexual activity: Never  Lifestyle  . Physical activity:    Days per week: Not on file    Minutes per session: Not on file  . Stress: Not on file  Relationships  . Social connections:    Talks on  phone: Not on file    Gets together: Not on file    Attends religious service: Not on file    Active member of club or organization: Not on file    Attends meetings of clubs or organizations: Not on file    Relationship status: Not on file  . Intimate partner violence:    Fear of current or ex partner: Not on file    Emotionally abused: Not on file    Physically abused: Not on file    Forced sexual activity: Not on file  Other Topics Concern  . Not on file  Social History Narrative  . Not on file     Family History:   Family History  Problem Relation Age of Onset  . CAD Father   . Colon cancer Sister   . Leukemia Brother   . Throat cancer Brother   . Cervical cancer Sister     ROS:  Review of Systems  Constitutional: Positive for malaise/fatigue. Negative for chills, diaphoresis, fever and weight loss.  HENT: Negative for congestion.   Eyes: Negative for discharge and redness.  Respiratory: Positive for shortness of breath and wheezing. Negative for cough, hemoptysis and sputum production.   Cardiovascular: Positive for leg swelling. Negative for chest pain, palpitations, orthopnea, claudication and PND.  Gastrointestinal: Negative for abdominal pain, blood in stool, heartburn, melena, nausea and vomiting.  Genitourinary: Negative for hematuria.  Musculoskeletal: Negative for falls and myalgias.  Skin: Negative for rash.  Neurological: Positive for weakness. Negative for dizziness, tingling, tremors, sensory change, speech change, focal weakness and loss of consciousness.  Endo/Heme/Allergies: Does not bruise/bleed easily.  Psychiatric/Behavioral: Negative for substance abuse. The patient is not nervous/anxious.   All other systems reviewed and are negative.     Physical Exam/Data:   Vitals:   01/18/18 1030 01/18/18 1100 01/18/18 1130 01/18/18 1200  BP: 107/72 106/63 102/70 113/63  Pulse: (!) 105 98 (!) 102 97  Resp: (!) 21 (!) 23  18  Temp:      TempSrc:        SpO2: 100% 100% 100% 100%  Weight:      Height:        Intake/Output Summary (Last 24  hours) at 01/18/2018 1346 Last data filed at 01/18/2018 1015 Gross per 24 hour  Intake 400 ml  Output -  Net 400 ml   Filed Weights   01/18/18 0751  Weight: 160 lb (72.6 kg)   Body mass index is 31.25 kg/m.   Physical Exam: General: Well developed, well nourished, in no acute distress. Head: Normocephalic, atraumatic, sclera non-icteric, no xanthomas, nares without discharge.  Neck: Negative for carotid bruits. JVD difficult to assess given BiPAP straps. Lungs: Diminished breath sounds bilaterally with coarse breath sounds. Breathing is unlabored. On BiPAP. Heart: RRR with S1 S2. No murmurs, rubs, or gallops appreciated. Abdomen: Soft, non-tender, non-distended with normoactive bowel sounds. No hepatomegaly. No rebound/guarding. No obvious abdominal masses. Msk:  Strength and tone appear normal for age. Extremities: No clubbing or cyanosis. No edema. Distal pedal pulses are 2+ and equal bilaterally. Neuro: Alert and oriented X 3. No facial asymmetry. No focal deficit. Moves all extremities spontaneously. Psych:  Responds to questions appropriately with a normal affect.   EKG:  The EKG was personally reviewed and demonstrates: sinus tachycardia, 142 bpm,LBBB (known), rare PVC Telemetry:  Telemetry was personally reviewed and demonstrates: NSR with heart rates in the 80s bpm currently, occasional PVCs, initially with sinus tachycardia with heart rates in the 130s to 140s bpm  Weights: Filed Weights   01/18/18 0751  Weight: 160 lb (72.6 kg)    Relevant CV Studies: TTE with bubble study 05/2017: Study Conclusions  - Left ventricle: Systolic function was moderately reduced. The   estimated ejection fraction was in the range of 35% to 40%. - Aortic valve: There was mild regurgitation. - Mitral valve: There was moderate regurgitation. - Left atrium: The atrium was mildly dilated. - Atrial  septum: Echo contrast study showed no right-to-left atrial   level shunt, at baseline or with provocation.   R/LHC 02/2017: Coronary angiography:  Coronary dominance: Right  Left mainstem: Large vessel that bifurcates into the LAD and left circumflex, no significant disease noted  Left anterior descending (LAD): Large vessel that extends to the apical region, diagonal branch 2 of moderate size, no significant disease noted  Left circumflex (LCx): Large vessel with OM branch 2, no significant disease noted  Right coronary artery (RCA): Right dominant vessel with PL and PDA, no significant disease noted  Left ventriculography: Not performed as she had recent echo and has underlying CRI  Final Conclusions:  Normal coronary arteries  Right heart pressures: RA: mean 22 RV: 53/18/22 RVEDP: 22 LVEDP 38-43 Unable to advance catheter to wedge pressure despite multiple attempts   Recommendations:  Normal coronary arteries Wall motion secondary to LBBB Markedly elevated LVEDP Elevated right atrial pressures Numbers consistent with chronic diastolic CHF Blood pressure markedly elevated, tachycardic   Laboratory Data:  Chemistry Recent Labs  Lab 01/16/18 0733 01/18/18 0751 01/18/18 1304  NA 139 136  --   K 4.1 5.0  --   CL 103 104  --   CO2 26 23  --   GLUCOSE 129* 295*  --   BUN 66* 63*  --   CREATININE 2.11* 2.18* 2.08*  CALCIUM 8.4* 8.5*  --   GFRNONAA 22* 21* 22*  GFRAA 25* 24* 26*  ANIONGAP 10 9  --     Recent Labs  Lab 01/16/18 0733  PROT 6.8  ALBUMIN 3.6  AST 19  ALT 7*  ALKPHOS 47  BILITOT 0.7   Hematology Recent Labs  Lab 01/16/18 0733 01/18/18 0751  WBC 13.2* 20.0*  RBC 3.42* 3.73*  HGB 10.5* 11.3*  HCT 30.4* 33.9*  MCV 88.9 90.8  MCH 30.5 30.3  MCHC 34.3 33.3  RDW 15.4* 16.0*  PLT 279 353   Cardiac Enzymes Recent Labs  Lab 01/16/18 0733 01/18/18 0751  TROPONINI 0.05* 0.04*   No results for input(s): TROPIPOC in the last 168 hours.   BNP Recent Labs  Lab 01/16/18 0734  BNP 319.0*    DDimer No results for input(s): DDIMER in the last 168 hours.  Radiology/Studies:  Dg Chest Portable 1 View  Result Date: 01/18/2018 IMPRESSION: Pulmonary vascular congestion with slight bibasilar interstitial edema. Minimal right pleural effusion with mild right base atelectasis. No consolidation. No adenopathy evident. Electronically Signed   By: Lowella Grip III M.D.   On: 01/18/2018 08:01   Dg Chest Port 1 View  Result Date: 01/16/2018 IMPRESSION: Low-grade CHF.  No alveolar pneumonia.  Stable cardiomegaly. Thoracic aortic atherosclerosis. Electronically Signed   By: David  Martinique M.D.   On: 01/16/2018 07:30    Assessment and Plan:   1. Acute on chronic respiratory failure with hypoxia: -Likely multifactorial including AECOPD, acute on chronic combined CHF and PAH -Continues to require BiPAP, wean as able per PCCM -Supportive care  2. Acute on chronic combined CHF/PAH: -She does appear mildly volume up -Escalate IV Lasix to 40 mg bid given renal function and volume overload  -Check BNP to compare to value checked on 4/22 in the ED -Recent echo in 05/2017, no need to repeat at this time -Coreg and spironolactone -Not on ACEi/ARB given history of angioedema  -CHF education  -Strict Is and Os, daily weights    3. CKD stage III: -Monitor with diuresis -She has previously had bumps in her renal function with diuresis   4. Elevated troponin/nonobstructive CAD: -Minimally elevated likely in the setting of supply demand ischemia secondary to AECOPD, volume overload, and CKD -Continue to cycle to rule out -Would not start heparin gtt unless there is dynamic elevation  -ASA  5. AECOPD: -ABX, nebs, steroids per IM  6. Leukocytosis: -Possibly from AECOPD -Per IM  7. HLD: -Lipitor   8. DM2: -Per IM  9. HTN: -Improved   For questions or updates, please contact [facility] Please consult [email or web address] for  contact info under Cardiology/STEMI.   Signed, Christell Faith, PA-C [facility] Pager: [phone] 01/18/2018, 1:46 PM

## 2018-01-19 ENCOUNTER — Other Ambulatory Visit: Payer: Self-pay | Admitting: *Deleted

## 2018-01-19 DIAGNOSIS — J9612 Chronic respiratory failure with hypercapnia: Secondary | ICD-10-CM

## 2018-01-19 DIAGNOSIS — N184 Chronic kidney disease, stage 4 (severe): Secondary | ICD-10-CM

## 2018-01-19 DIAGNOSIS — J811 Chronic pulmonary edema: Secondary | ICD-10-CM

## 2018-01-19 LAB — BASIC METABOLIC PANEL
Anion gap: 8 (ref 5–15)
BUN: 66 mg/dL — AB (ref 6–20)
CO2: 26 mmol/L (ref 22–32)
CREATININE: 2.04 mg/dL — AB (ref 0.44–1.00)
Calcium: 8.5 mg/dL — ABNORMAL LOW (ref 8.9–10.3)
Chloride: 107 mmol/L (ref 101–111)
GFR, EST AFRICAN AMERICAN: 26 mL/min — AB (ref >60)
GFR, EST NON AFRICAN AMERICAN: 23 mL/min — AB (ref >60)
Glucose, Bld: 139 mg/dL — ABNORMAL HIGH (ref 65–99)
POTASSIUM: 4.3 mmol/L (ref 3.5–5.1)
SODIUM: 141 mmol/L (ref 135–145)

## 2018-01-19 LAB — CBC
HCT: 29.6 % — ABNORMAL LOW (ref 35.0–47.0)
HEMOGLOBIN: 9.8 g/dL — AB (ref 12.0–16.0)
MCH: 30.1 pg (ref 26.0–34.0)
MCHC: 33.1 g/dL (ref 32.0–36.0)
MCV: 90.8 fL (ref 80.0–100.0)
Platelets: 250 10*3/uL (ref 150–440)
RBC: 3.26 MIL/uL — AB (ref 3.80–5.20)
RDW: 15.9 % — ABNORMAL HIGH (ref 11.5–14.5)
WBC: 13.7 10*3/uL — ABNORMAL HIGH (ref 3.6–11.0)

## 2018-01-19 LAB — GLUCOSE, CAPILLARY
GLUCOSE-CAPILLARY: 125 mg/dL — AB (ref 65–99)
GLUCOSE-CAPILLARY: 151 mg/dL — AB (ref 65–99)
Glucose-Capillary: 183 mg/dL — ABNORMAL HIGH (ref 65–99)
Glucose-Capillary: 164 mg/dL — ABNORMAL HIGH (ref 65–99)

## 2018-01-19 LAB — TROPONIN I
Troponin I: 0.28 ng/mL (ref <0.03)
Troponin I: 0.26 ng/mL (ref <0.03)

## 2018-01-19 LAB — MAGNESIUM: MAGNESIUM: 1.8 mg/dL (ref 1.7–2.4)

## 2018-01-19 MED ORDER — METHYLPREDNISOLONE SODIUM SUCC 40 MG IJ SOLR
40.0000 mg | Freq: Two times a day (BID) | INTRAMUSCULAR | Status: DC
  Administered 2018-01-19 – 2018-01-22 (×6): 40 mg via INTRAVENOUS
  Filled 2018-01-19 (×6): qty 1

## 2018-01-19 MED ORDER — ALBUTEROL SULFATE (2.5 MG/3ML) 0.083% IN NEBU
2.5000 mg | INHALATION_SOLUTION | RESPIRATORY_TRACT | Status: DC | PRN
  Administered 2018-01-19: 2.5 mg via RESPIRATORY_TRACT
  Filled 2018-01-19 (×2): qty 3

## 2018-01-19 MED ORDER — ALPRAZOLAM 0.25 MG PO TABS
0.2500 mg | ORAL_TABLET | Freq: Three times a day (TID) | ORAL | Status: DC | PRN
  Administered 2018-01-19 (×2): 0.25 mg via ORAL
  Filled 2018-01-19 (×2): qty 1

## 2018-01-19 MED ORDER — BUDESONIDE 0.25 MG/2ML IN SUSP: 0.5000 mg | Freq: Two times a day (BID) | RESPIRATORY_TRACT | Status: DC

## 2018-01-19 MED ORDER — BUDESONIDE 0.25 MG/2ML IN SUSP
0.5000 mg | Freq: Two times a day (BID) | RESPIRATORY_TRACT | Status: DC
  Administered 2018-01-19 – 2018-01-22 (×6): 0.5 mg via RESPIRATORY_TRACT
  Filled 2018-01-19 (×7): qty 4

## 2018-01-19 MED ORDER — CARVEDILOL 3.125 MG PO TABS
3.1250 mg | ORAL_TABLET | Freq: Two times a day (BID) | ORAL | Status: DC
  Administered 2018-01-19 – 2018-01-20 (×4): 3.125 mg via ORAL
  Filled 2018-01-19 (×5): qty 1

## 2018-01-19 MED ORDER — ALBUTEROL SULFATE (2.5 MG/3ML) 0.083% IN NEBU: 2.5000 mg | INHALATION_SOLUTION | Freq: Four times a day (QID) | RESPIRATORY_TRACT | Status: DC

## 2018-01-19 NOTE — Progress Notes (Addendum)
[place] [facility] [place] NAME: Angela Pham    MR#:  [id number]  DATE OF BIRTH:  [birth year]  SUBJECTIVE:  CHIEF COMPLAINT:   Chief Complaint  Patient presents with  . Respiratory Distress   The patient feels better, oxygen by nasal cannula 3 L, off BiPAP. REVIEW OF SYSTEMS:  Review of Systems  Constitutional: Positive for malaise/fatigue. Negative for chills and fever.  HENT: Negative for sore throat.   Eyes: Negative for blurred vision and double vision.  Respiratory: Positive for cough, sputum production, shortness of breath and wheezing. Negative for hemoptysis and stridor.   Cardiovascular: Negative for chest pain, palpitations, orthopnea and leg swelling.  Gastrointestinal: Negative for abdominal pain, blood in stool, diarrhea, melena, nausea and vomiting.  Genitourinary: Negative for dysuria, flank pain and hematuria.  Musculoskeletal: Negative for back pain and joint pain.  Skin: Negative for rash.  Neurological: Negative for dizziness, sensory change, focal weakness, seizures, loss of consciousness, weakness and headaches.  Endo/Heme/Allergies: Negative for polydipsia.  Psychiatric/Behavioral: Negative for depression. The patient is not nervous/anxious.     DRUG ALLERGIES:   Allergies  Allergen Reactions  . Ace Inhibitors Anaphylaxis    Tongue swelling  . Shellfish Allergy Hives and Swelling    Swelling around face and mouth    VITALS:  Blood pressure 124/71, pulse (!) 109, temperature 97.7 F (36.5 C), temperature source Oral, resp. rate 20, height 4\' 11"  (1.499 m), weight 162 lb 4.1 oz (73.6 kg), SpO2 99 %. PHYSICAL EXAMINATION:  Physical Exam  Constitutional: She is oriented to person, place, and time.  HENT:  Head: Normocephalic.  Mouth/Throat: Oropharynx is clear and moist.  Eyes: Pupils are equal, round, and reactive to light. Conjunctivae and EOM are normal. No scleral icterus.  Neck: Normal range of motion.  Neck supple. No JVD present. No tracheal deviation present.  Cardiovascular: Normal rate, regular rhythm and normal heart sounds. Exam reveals no gallop.  No murmur heard. Pulmonary/Chest: Effort normal. No respiratory distress. She has no wheezes. She has rales.  Abdominal: Soft. Bowel sounds are normal. She exhibits no distension. There is no tenderness. There is no rebound.  Musculoskeletal: Normal range of motion. She exhibits no edema or tenderness.  Neurological: She is alert and oriented to person, place, and time. No cranial nerve deficit.  Skin: No rash noted. No erythema.   LABORATORY PANEL:  Female CBC Recent Labs  Lab 01/19/18 0505  WBC 13.7*  HGB 9.8*  HCT 29.6*  PLT 250   ------------------------------------------------------------------------------------------------------------------ Chemistries  Recent Labs  Lab 01/16/18 0733  01/18/18 1304 01/19/18 0505  NA 139   < >  --  141  K 4.1   < >  --  4.3  CL 103   < >  --  107  CO2 26   < >  --  26  GLUCOSE 129*   < >  --  139*  BUN 66*   < >  --  66*  CREATININE 2.11*   < > 2.08* 2.04*  CALCIUM 8.4*   < >  --  8.5*  MG  --   --  2.1  --   AST 19  --   --   --   ALT 7*  --   --   --   ALKPHOS 47  --   --   --   BILITOT 0.7  --   --   --    < > =  values in this interval not displayed.   RADIOLOGY:  No results found. ASSESSMENT AND PLAN:   Acute on chronic respiratory failure with hypoxia due to acute on chronic diastolic CHF. On O2 [place] 3 L, off BiPAP.   Acute on chronic diastolic CHF. Continue Lasix IV 40 mg every 12 hours per Dr. Rockey Situ. Hold torsemide, continue spironolactone and Coreg if blood pressure allows.  COPD exacerbation. Per Dr. Alva Garnet, discontinued IV Solu-Medrol, continue albuterol prn.  Robitussin as needed. The patient still has shortness of breath and wheezing, tachycardia.  I will resume IV Solu-Medrol, albuterol every 6 hours.  VQ scan to rule out PE.  Elevated troponin, demanding  ischemia due to above.  Continue aspirin.  Leukocytosis.  Unclear etiology, improving.  Pulmonary hypertension.  Follow-up pulmonary physician as outpatient.  CKD stage IV.  Follow-up BMP while on Lasix.  Diabetes.  on sliding scale.  All the records are reviewed and case discussed with Care Management/Social Worker. Management plans discussed with the patient, family and they are in agreement.  CODE STATUS: DNR  TOTAL TIME TAKING CARE OF THIS PATIENT: 46 minutes.   More than 50% of the time was spent in counseling/coordination of care: YES  POSSIBLE D/C IN 2-3 DAYS, DEPENDING ON CLINICAL CONDITION.   Demetrios Loll M.D on 01/19/2018 at 2:13 PM  Between 7am to 6pm - Pager - [phone]  After 6pm go to [email or web address] - [facility]

## 2018-01-19 NOTE — Consult Note (Signed)
Consultation Note Date: 01/19/2018   Patient Name: Angela Pham  DOB: 1940-10-05  MRN: [id number]  Age / Sex: 77 y.o., female  PCP: Gareth Morgan, MD Referring Physician: Demetrios Loll, MD  Reason for Consultation: Establishing goals of care  HPI/Patient Profile: 77 y.o. female admitted on 01/18/2018 from home with shortness of breath. 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 She was seen in the ED 2 days prior to admission with shortness breath and treated for COPD exacerbation.  Patient reported feeling somewhat better the following day however on the day of admission she woke and was having severe shortness of breath, wheezing, and some chest tightness despite doing all of her home regimens including nebulizer and inhalers.  During her ED course she was found to have respiratory distress, wheezing, and hypoxia at 80% on room air.  He was placed on BiPAP and chest x-ray showed pulmonary edema. Since admission she has been weaned off BiPAP and tolerating 3L/[place]. Palliative Medicine consulted for goals of care discussion.   Clinical Assessment and Goals of Care: I have reviewed medical records including lab results, imaging, Epic notes, and MAR, received report from the bedside RN, and assessed the patient. I then met at the bedside with patient and Jerrell Belfast, RN (PACE) to discuss diagnosis prognosis, GOC, EOL wishes, disposition and options. Patient is alert and oriented x3. She is awake and maintaining incision saturations on 3 L nasal cannula.  She does experience some shortness of breath during conversation which requires her to take breaks and focus on breathing.  Patient is appropriate to engage in goals of care discussion  and make decisions.  I introduced Palliative Medicine as specialized medical care for people living with serious illness. It focuses on providing relief from the symptoms and stress of a serious illness. The goal is to improve quality of life for both the patient and the family.  She did verbalize she is somewhat familiar with services as she was receiving palliative services in December 2018 from [facility].  She states she became a part of the pace program in March 2019 and that that time palliative services was not continued.  We discussed a brief life review of the patient.  She states she recently moved to [place] in 2017 after living in [place] since 1965.  She resides with her niece Barnett Applebaum, who she states is her primary caregiver and health care power of attorney.  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  She does not have any children of her own.  She is a woman of [place] and lives music, dancing, and being around friends and family.  As far as functional and nutritional status patient states she does not have a concern with her appetite.  She laughed and stated actually her doctors have more of a concern because she eats too  much.  [name] states she has always had asthma since she was a child.  She reports not having any complications from the ages of [age].  During 1995-1999 she reports beginning to have complications with asthma again causing her to be hospitalized almost every month for several years in a row.  She reports having a severe exacerbation with her asthma back in March 2018.  At that time she was told that she has COPD and congestive heart failure.  Since then she feels that she has had difficulty managing her respiratory status as well as her fluid volume.  She reports noticing her weight being up and down due to fluid as well as swelling in her legs and feet.  She reports being hospitalized or being seen in the emergency room at least  once a month since last August.  After her hospitalization in December 2018 she went to [facility] for rehabilitation and stayed for 2 weeks and later came home with home health services.  She is able to ambulate independently without use of assistive devices.  However she reports feeling weaker over the past month and requiring more assistance with ADLs.  We discussed her current illness and what it means in the larger context of her on-going co-morbidities.  Natural disease trajectory and expectations at EOL were discussed.  Patient verbalized she does not feel like she is nowhere near the end of her life, however she is aware that her condition is chronic and continue it is taking a toll on her body.  She states she is aware that years from now (hopefully) her conditions such as COPD and CHF will result in her death.   I attempted to elicit values and goals of care important to the patient.    The difference between aggressive medical intervention and comfort care was considered in light of the patient's goals of care. Patient feels at this time she would like to continue with aggressive measures and continue to treat in the treatable. She feels that having some rehabilitation and more supportive care now that she is with [facility]ACE she can live for many more years.    Advanced directives, concepts specific to code status, artifical feeding and hydration, and rehospitalization were considered and discussed. She is currently a DNR/DNI at her request.  She states she would like to continue with aggressive treatment however she has no desire or wants, to be placed on any forms of life-sustaining machines or undergo CPR when that time comes.  Her wishes is to have a natural death if her heart was to stop or if she was to stop breathing.  She verbalizes that her niece and other family members are aware of her wishes.  Hospice and Palliative Care services outpatient were explained and offered.  Patient has  requested to have palliative care services initiated outpatient once she has been discharged.  [facility]ACE nurse verbalized understanding of patient's request and stated this service could be initiated with their services.   Questions and concerns were addressed. The family was encouraged to call with questions or concerns.  PMT will continue to support holistically.  HCPOA-[name] (niece).  She is alert and oriented x3, capable of making her own medical decisions at this time.    SUMMARY OF RECOMMENDATIONS    DNR/DNI at patient's request.  Continue to treat the treatable at patient's request.  Patient verbalizes wanting to be evaluated and considered for placement at rehabilitation facility to help her gain strength versus being  at home with therapy. Advise would make recommendation to attending regarding possible PT evaluation and recommendation.   Would recommend PT to evaluate and provide recommendations regarding needs.  Case manager consult for outpatient palliative services at discharge.  Palliative medicine will continue to support patient, family, and medical team during hospitalization as needed.  Please call if needed.   Code Status/Advance Care Planning:  DNR/DNI at patients request prior   Palliative Prophylaxis:   Bowel Regimen and Frequent Pain Assessment  Additional Recommendations (Limitations, Scope, Preferences):  Full Scope Treatment continue to treat the treatable   Psycho-social/Spiritual:   Desire for further Chaplaincy support:NO   Prognosis:   Unable to determine -guarded in the setting of CHF, COPD, asthma, frequent hospitalizations, generalized weakness.  Discharge Planning: Palliative care services outpatient in home setting vs. rehabilitation facility       Primary Diagnoses: Present on Admission: . Acute respiratory failure with hypoxia ([place])   I have reviewed the medical record, interviewed the patient and family, and examined the patient.  The following aspects are pertinent.  Past Medical History:  Diagnosis Date  . Asthma   . Chronic combined systolic and diastolic CHF (congestive heart failure) ([place])    a. TTE 02/2017: EF 45-50%, basal amd midanteroseptal HK b. 05/2017: echo showing EF of 35-40%, mild AI, moderate MR, and mildly dilated LA.   Marland Kitchen Coronary artery disease, non-occlusive    a. LHC 02/2017 normal coronary arteries  . Hyperlipidemia   . Hypertension   . Morbid obesity ([place])   . OSA (obstructive sleep apnea)    a. On CPAP w/ O2 bleed in.  . Pulmonary hypertension ([place])   . Type II diabetes mellitus ([place])    Social History   Socioeconomic History  . Marital status: Widowed    Spouse name: Not on file  . Number of children: Not on file  . Years of education: Not on file  . Highest education level: Not on file  Occupational History  . Occupation: retired  Scientific laboratory technician  . Financial resource strain: Not on file  . Food insecurity:    Worry: Not on file    Inability: Not on file  . Transportation needs:    Medical: Not on file    Non-medical: Not on file  Tobacco Use  . Smoking status: Former Smoker    Packs/day: 0.30    Years: 20.00    Pack years: 6.00    Types: Cigarettes  . Smokeless tobacco: Never Used  Substance and Sexual Activity  . Alcohol use: No  . Drug use: No  . Sexual activity: Never  Lifestyle  . Physical activity:    Days per week: Not on file    Minutes per session: Not on file  . Stress: Not on file  Relationships  . Social connections:    Talks on phone: Not on file    Gets together: Not on file    Attends religious service: Not on file    Active member of club or organization: Not on file    Attends meetings of clubs or organizations: Not on file    Relationship status: Not on file  Other Topics Concern  . Not on file  Social History Narrative  . Not on file   Family History  Problem Relation Age of Onset  . CAD Father   . Colon cancer Sister   . Leukemia  Brother   . Throat cancer Brother   . Cervical cancer Sister  Scheduled Meds: . arformoterol  15 mcg Nebulization BID  . aspirin EC  81 mg Oral Daily  . atorvastatin  40 mg Oral Daily  . budesonide  0.5 mg Nebulization BID  . carvedilol  3.125 mg Oral BID WC  . furosemide  40 mg Intravenous BID  . heparin  5,000 Units Subcutaneous Q8H  . insulin aspart  0-5 Units Subcutaneous QHS  . insulin aspart  0-9 Units Subcutaneous TID WC  . loratadine  10 mg Oral Daily  . mirtazapine  45 mg Oral QHS  . montelukast  10 mg Oral QHS  . senna-docusate  2 tablet Oral BID  . sodium chloride flush  3 mL Intravenous Q12H  . spironolactone  12.5 mg Oral Daily  . tiotropium  18 mcg Inhalation Daily   Continuous Infusions: . sodium chloride     PRN Meds:.sodium chloride, acetaminophen **OR** acetaminophen, albuterol, ALPRAZolam, bisacodyl, guaiFENesin-dextromethorphan, HYDROcodone-acetaminophen, [DISCONTINUED] ondansetron **OR** ondansetron (ZOFRAN) IV, senna-docusate, sodium chloride flush Medications Prior to Admission:  Prior to Admission medications   Medication Sig Start Date End Date Taking? Authorizing Provider  aspirin EC 81 MG EC tablet Take 1 tablet (81 mg total) by mouth daily. 03/12/17  Yes Mody, Ulice Bold, MD  atorvastatin (LIPITOR) 40 MG tablet Take 40 mg by mouth daily.   Yes [provider]  budesonide (PULMICORT) 0.25 MG/2ML nebulizer solution Take 2 mLs (0.25 mg total) by nebulization 2 (two) times daily. DX: COPD J44.9 10/06/17  Yes Wilhelmina Mcardle, MD  carvedilol (COREG) 6.25 MG tablet Take 1 tablet (6.25 mg total) by mouth daily. Patient taking differently: Take 6.25 mg by mouth 2 (two) times daily.  11/12/17  Yes Mody, Ulice Bold, MD  levocetirizine (XYZAL) 5 MG tablet Take 5 mg by mouth daily.  03/18/17  Yes [provider]  metFORMIN (GLUCOPHAGE) 1000 MG tablet Take 1 tablet by mouth 2 (two) times daily.    Yes [provider]  mirtazapine (REMERON) 45 MG  tablet Take 1 tablet by mouth at bedtime.   Yes [provider]  montelukast (SINGULAIR) 10 MG tablet Take 10 mg by mouth at bedtime.   Yes [provider]  Multiple Vitamins-Minerals (EYE VITAMINS) CAPS Take 1 tablet by mouth 2 (two) times daily.    Yes [provider]  salmeterol (SEREVENT DISKUS) 50 MCG/DOSE diskus inhaler Inhale 1 puff into the lungs 2 (two) times daily. 10/07/17 10/07/18 Yes Wilhelmina Mcardle, MD  senna-docusate (SENOKOT-S) 8.6-50 MG tablet Take 2 tablets by mouth 2 (two) times daily. 07/04/17  Yes Sudini, Alveta Heimlich, MD  spironolactone (ALDACTONE) 25 MG tablet Take 0.5 tablets (12.5 mg total) by mouth daily. 06/23/17  Yes Strader, Lincoln, PA-C  tiotropium (SPIRIVA HANDIHALER) 18 MCG inhalation capsule Place 1 capsule (18 mcg total) into inhaler and inhale daily. 07/04/17 07/04/18 Yes Sudini, Alveta Heimlich, MD  torsemide (DEMADEX) 20 MG tablet Take 1 tablet (20 mg total) by mouth 2 (two) times daily. 11/12/17  Yes Mody, Ulice Bold, MD  arformoterol (BROVANA) 15 MCG/2ML NEBU Take 2 mLs (15 mcg total) 2 (two) times daily by nebulization. DX: COPD J44.9 Patient not taking: Reported on 01/18/2018 08/08/17   Wilhelmina Mcardle, MD  potassium chloride SA (K-DUR,KLOR-CON) 20 MEQ tablet Take 1 tablet (20 mEq total) by mouth daily. Patient not taking: Reported on 01/18/2018 08/23/17   Fritzi Mandes, MD   Allergies  Allergen Reactions  . Ace Inhibitors Anaphylaxis    Tongue swelling  . Shellfish Allergy Hives and Swelling    Swelling  around face and mouth    Review of Systems  Constitutional: Positive for activity change and fatigue.  Respiratory: Positive for shortness of breath and wheezing.   Cardiovascular: Positive for leg swelling.  Neurological: Positive for weakness.  All other systems reviewed and are negative.   Physical Exam  Constitutional: She is oriented to person, place, and time. Vital signs are normal. She appears well-developed. She is cooperative.    Cardiovascular: Normal rate, regular rhythm, normal heart sounds, intact distal pulses and normal pulses.  Pulmonary/Chest: She has rales.  Shortness of breath during conversation  Abdominal: Soft. Normal appearance and bowel sounds are normal.  Musculoskeletal:  Generalized weakness   Neurological: She is alert and oriented to person, place, and time.  Skin: Skin is warm and dry.  Psychiatric: She has a normal mood and affect. Her speech is normal and behavior is normal. Judgment and thought content normal. Cognition and memory are normal.  Nursing note and vitals reviewed.   Vital Signs: BP 124/71 (BP Location: Left Arm)   Pulse (!) 109   Temp 97.7 F (36.5 C) (Oral)   Resp 20   Ht 4' 11"  (1.499 m)   Wt 73.6 kg (162 lb 4.1 oz)   SpO2 99%   BMI 32.77 kg/m  Pain Scale: 0-10   Pain Score: 0-No pain   SpO2: SpO2: 99 % O2 Device:SpO2: 99 % O2 Flow Rate: .O2 Flow Rate (L/min): 3 L/min  IO: Intake/output summary:   Intake/Output Summary (Last 24 hours) at 01/19/2018 1526 Last data filed at 01/19/2018 1200 Gross per 24 hour  Intake 240 ml  Output 1850 ml  Net -1610 ml    LBM: Last BM Date: 01/18/18 Baseline Weight: Weight: 72.6 kg (160 lb) Most recent weight: Weight: 73.6 kg (162 lb 4.1 oz)     Palliative Assessment/Data:PPS 50%   Time In: 1300 Time Out: 1415 Time Total: 75 min  Greater than 50%  of this time was spent counseling and coordinating care related to the above assessment and plan.  Signed by: Alda Lea, NP-BC Palliative Medicine Team  Phone: [phone] Fax: [phone]    Please contact Palliative Medicine Team phone at [phone] for questions and concerns.  For individual provider: See Shea Evans

## 2018-01-19 NOTE — Progress Notes (Addendum)
Progress Note  Patient Name: Angela Pham Date of Encounter: 01/19/2018  Primary Cardiologist: Fletcher Anon  Subjective   SOB a little improved. Wore BiPAP overnight, now on supplemental oxygen via nasal cannula at 3 L, sitting up in recliner. No chest pain. Documented UOP of 710 mL for the past 24 hours. Weight up 2 pounds from 160-->162 pounds. BP soft in the 40J systolic. Renal function stable at 2.04. Leukocytosis improving. HGB down trending from 11.3-->9.8.    Inpatient Medications    Scheduled Meds: . arformoterol  15 mcg Nebulization BID  . aspirin EC  81 mg Oral Daily  . atorvastatin  40 mg Oral Daily  . budesonide  0.25 mg Nebulization BID  . carvedilol  6.25 mg Oral BID  . furosemide  40 mg Intravenous BID  . heparin  5,000 Units Subcutaneous Q8H  . insulin aspart  0-5 Units Subcutaneous QHS  . insulin aspart  0-9 Units Subcutaneous TID WC  . loratadine  10 mg Oral Daily  . mirtazapine  45 mg Oral QHS  . montelukast  10 mg Oral QHS  . senna-docusate  2 tablet Oral BID  . sodium chloride flush  3 mL Intravenous Q12H  . spironolactone  12.5 mg Oral Daily  . tiotropium  18 mcg Inhalation Daily   Continuous Infusions: . sodium chloride     PRN Meds: sodium chloride, acetaminophen **OR** acetaminophen, albuterol, bisacodyl, guaiFENesin-dextromethorphan, HYDROcodone-acetaminophen, ondansetron **OR** ondansetron (ZOFRAN) IV, senna-docusate, sodium chloride flush   Vital Signs    Vitals:   01/19/18 0400 01/19/18 0500 01/19/18 0507 01/19/18 0700  BP: (!) 88/50 (!) 94/52  (!) 95/53  Pulse: 77 90  85  Resp: 11 (!) 22  (!) 23  Temp:      TempSrc:      SpO2: 97% 96%  99%  Weight:   162 lb 4.1 oz (73.6 kg)   Height:        Intake/Output Summary (Last 24 hours) at 01/19/2018 0731 Last data filed at 01/18/2018 2329 Gross per 24 hour  Intake 880 ml  Output 1590 ml  Net -710 ml   Filed Weights   01/18/18 0751 01/18/18 1300 01/19/18 0507  Weight: 160 lb (72.6 kg) 160 lb  11.5 oz (72.9 kg) 162 lb 4.1 oz (73.6 kg)    Telemetry    NSR with occasional PVC - Personally Reviewed  ECG    n/a - Personally Reviewed  Physical Exam   GEN: No acute distress.   Neck: JVD difficult to assess secondary to body habitus. Cardiac: RRR, no murmurs, rubs, or gallops.  Respiratory: Diminished breath sounds bilaterally.  GI: Soft, nontender, non-distended.   MS: No edema; No deformity. Neuro:  Alert and oriented x 3; Nonfocal.  Psych: Normal affect.  Labs    Chemistry Recent Labs  Lab 01/16/18 0733 01/18/18 0751 01/18/18 1304 01/19/18 0505  NA 139 136  --  141  K 4.1 5.0  --  4.3  CL 103 104  --  107  CO2 26 23  --  26  GLUCOSE 129* 295*  --  139*  BUN 66* 63*  --  66*  CREATININE 2.11* 2.18* 2.08* 2.04*  CALCIUM 8.4* 8.5*  --  8.5*  PROT 6.8  --   --   --   ALBUMIN 3.6  --   --   --   AST 19  --   --   --   ALT 7*  --   --   --  ALKPHOS 47  --   --   --   BILITOT 0.7  --   --   --   GFRNONAA 22* 21* 22* 23*  GFRAA 25* 24* 26* 26*  ANIONGAP 10 9  --  8     Hematology Recent Labs  Lab 01/16/18 0733 01/18/18 0751 01/19/18 0505  WBC 13.2* 20.0* 13.7*  RBC 3.42* 3.73* 3.26*  HGB 10.5* 11.3* 9.8*  HCT 30.4* 33.9* 29.6*  MCV 88.9 90.8 90.8  MCH 30.5 30.3 30.1  MCHC 34.3 33.3 33.1  RDW 15.4* 16.0* 15.9*  PLT 279 353 250    Cardiac Enzymes Recent Labs  Lab 01/16/18 0733 01/18/18 0751  TROPONINI 0.05* 0.04*   No results for input(s): TROPIPOC in the last 168 hours.   BNP Recent Labs  Lab 01/16/18 0734 01/18/18 1449  BNP 319.0* 813.0*     DDimer No results for input(s): DDIMER in the last 168 hours.   Radiology    Dg Chest Portable 1 View  Result Date: 01/18/2018 IMPRESSION: Pulmonary vascular congestion with slight bibasilar interstitial edema. Minimal right pleural effusion with mild right base atelectasis. No consolidation. No adenopathy evident. Electronically Signed   By: Lowella Grip III M.D.   On: 01/18/2018 08:01     Cardiac Studies   TTE with bubble study 05/2017: Study Conclusions  - Left ventricle: Systolic function was moderately reduced. The estimated ejection fraction was in the range of 35% to 40%. - Aortic valve: There was mild regurgitation. - Mitral valve: There was moderate regurgitation. - Left atrium: The atrium was mildly dilated. - Atrial septum: Echo contrast study showed no right-to-left atrial level shunt, at baseline or with provocation.   R/LHC 02/2017: Coronary angiography:  Coronary dominance: Right  Left mainstem: Large vessel that bifurcates into the LAD and left circumflex, no significant disease noted  Left anterior descending (LAD): Large vessel that extends to the apical region, diagonal branch 2 of moderate size, no significant disease noted  Left circumflex (LCx): Large vessel with OM branch 2, no significant disease noted  Right coronary artery (RCA): Right dominant vessel with PL and PDA, no significant disease noted  Left ventriculography: Not performed as she had recent echo and has underlying CRI  Final Conclusions:  Normal coronary arteries  Right heart pressures: RA: mean 22 RV: 53/18/22 RVEDP: 22 LVEDP 38-43 Unable to advance catheter to wedge pressure despite multiple attempts   Recommendations:  Normal coronary arteries Wall motion secondary to LBBB Markedly elevated LVEDP Elevated right atrial pressures Numbers consistent with chronic diastolic CHF Blood pressure markedly elevated, tachycardic    Patient Profile     77 y.o. female with history of nonobstructive CAD by [facility] 02/2017, chronic combined CHF, PAH, COPD, HTN, asthma, DM2, and HLD who was admitted with acute on chronic respiratory failure that was felt to be multifactorial as below.   Assessment & Plan    1. Acute on chronic respiratory failure with hypoxia: -Likely multifactorial including AECOPD, acute on chronic combined CHF and PAH -Continues to require  nighttime BiPAP, now on nasal cannula at 3 L, wean as able per PCCM -Supportive care  2. Acute on chronic combined CHF/PAH: -She does continue to appear mildly volume up -Continue IV Lasix to 40 mg bid   -BNP elevated at 813, which is up from 319 on 4/22 -Recent echo in 05/2017, no need to repeat at this time -Coreg and spironolactone -Not on ACEi/ARB given history of angioedema  -CHF education  -Strict Is  and Os, daily weights    3. CKD stage III: -Stable -Monitor with diuresis -She has previously had bumps in her renal function with diuresis   4. Elevated troponin/nonobstructive CAD: -Minimally elevated likely in the setting of supply demand ischemia secondary to AECOPD, volume overload, and CKD -Continue to cycle to rule out -Would not start heparin gtt unless there is dynamic elevation  -ASA  5. AECOPD: -Nebs per IM -Steroids and ABX held  6. Leukocytosis: -Possibly from AECOPD/steroids  -Improving  -Per IM  7. HLD: -Lipitor   8. DM2: -Per IM  9. HTN: -BP on the soft side -Decrease Coreg to 3.125 mg bid    For questions or updates, please contact [facility] Please consult [email or web address] for contact info under Cardiology/STEMI.    Signed, Christell Faith, PA-C [place] Pager: [phone] 01/19/2018, 7:31 AM   Attending Note Patient seen and examined, agree with detailed note above,  Patient presentation and plan discussed on rounds.    Still with significant shortness of breath this morning On BiPAP all night Short of breath when off the BiPAP Chest feels very tight when trying to breathe Not coughing anything up Had all of her nebulizer treatments this morning, inhalers still with symptoms Does not feel near her baseline  On exam unable to estimate JVP, lungs clear with scattered rales, wheezes Heart sounds regular normal S1-S2 tachycardic abdomen obese soft nontender no significant lower extremity edema  Telemetry reviewed  heart rate down from 120 yesterday evening now 100 bpm  Lab work reviewed showing stable to improving creatinine 2.04 BUN up slightly 66 hematocrit dropped 29.6 previously 33.9  A/P: 1 Acute respiratory distress Multifactorial, chronic diastolic CHF, underlying lung disease BNP elevated past 2 days, now 800   narrow therapeutic window Would continue aggressive IV diuretic Lasix 40 IV twice daily if not every 8 -Through the pace program she will require torsemide as needed to take at home and even metolazone as needed  2.  Sinus tachycardia Carvedilol, heart rate improving down to 100 Not a good candidate for calcium channel blockers given her systolic dysfunction unable to exclude tachycardia mediated cardiomyopathy  3.  Chronic kidney disease Worsening over the past year with aggressive diuresis Stable creatinine 2.0 even with diuresis  4.  Diastolic CHF/pulmonary hypertension Continue Lasix IV, spironolactone, Coreg  5.  COPD Steroids held by intensivist On inhalers  Greater than 50% was spent in counseling and coordination of care with patient Total encounter time 25 minutes or more   Signed: Esmond Plants  M.D., Ph.D. [facility]

## 2018-01-19 NOTE — Progress Notes (Addendum)
Patient on CPAP, continuous  Per MD order. New Med orders, see MAR.  Kyla Balzarine, LPN

## 2018-01-19 NOTE — Progress Notes (Signed)
Patient has order to transfer to floor care with cardiac monitoring, bed assignment received to room 113, patient updated.  Report called to receiving RN Japhet Morgenthaler with no further questions.

## 2018-01-19 NOTE — Progress Notes (Addendum)
Nuclear Medicine called to transport/prepare patient for test. Patient unable to tolerate flat position and NM stated CPAP machine is incompatible with NM machine. Patient also stated she did not want to go to NM at this time due to SOB.  Kyla Balzarine, LPN

## 2018-01-19 NOTE — Progress Notes (Signed)
She was much improved this AM and comfortable on [place] O2 but dyspneic with very modest exertion. I placed order for transfer to MedSurg floor this AM. I continue to believe that the major acute problem is pulmonary edema/CHF. There was no wheezing on exam.   Would continue her on her current COPD regimen and discharge her on her previous regimen. I will schedule follow up with me in the office in 3-4 weeks with CXR prior to that visit  PCCM will sign off. Please call if we can be of further assistance   Merton Border, MD PCCM service Mobile [phone] Pager [phone] 01/19/2018 2:20 PM

## 2018-01-19 NOTE — Progress Notes (Signed)
Pt O2 100% 3L Nasal canula Pulse 126 Patient stated increased SOB with exertion or moving in bed. States she wants to be back on BiPap. MD paged. Anxiety medication given at 1500. Kyla Balzarine, LPN

## 2018-01-19 NOTE — Progress Notes (Signed)
RN notified PA, Christell Faith of patient having troponin of 0.28, Ryan states "yes, I saw that, we'll just monitor her for now"  RN notified that patient bp islow,  the lowest seen this shift systolic 83, currently 14/78.  Ryan states to hold aldactone, and coreg, and give lasix only if systolic is >29.

## 2018-01-20 ENCOUNTER — Telehealth: Payer: Self-pay | Admitting: Pulmonary Disease

## 2018-01-20 ENCOUNTER — Inpatient Hospital Stay: Payer: No Typology Code available for payment source

## 2018-01-20 LAB — GLUCOSE, CAPILLARY
GLUCOSE-CAPILLARY: 148 mg/dL — AB (ref 65–99)
Glucose-Capillary: 191 mg/dL — ABNORMAL HIGH (ref 65–99)
Glucose-Capillary: 202 mg/dL — ABNORMAL HIGH (ref 65–99)
Glucose-Capillary: 166 mg/dL — ABNORMAL HIGH (ref 65–99)

## 2018-01-20 LAB — TSH: TSH: 0.432 u[IU]/mL (ref 0.350–4.500)

## 2018-01-20 LAB — BASIC METABOLIC PANEL
Anion gap: 8 (ref 5–15)
BUN: 57 mg/dL — AB (ref 6–20)
CALCIUM: 8.8 mg/dL — AB (ref 8.9–10.3)
CO2: 28 mmol/L (ref 22–32)
CREATININE: 1.82 mg/dL — AB (ref 0.44–1.00)
Chloride: 105 mmol/L (ref 101–111)
GFR calc Af Amer: 30 mL/min — ABNORMAL LOW (ref >60)
GFR, EST NON AFRICAN AMERICAN: 26 mL/min — AB (ref >60)
GLUCOSE: 178 mg/dL — AB (ref 65–99)
Potassium: 4 mmol/L (ref 3.5–5.1)
Sodium: 141 mmol/L (ref 135–145)

## 2018-01-20 MED ORDER — FUROSEMIDE 10 MG/ML IJ SOLN
40.0000 mg | INTRAMUSCULAR | Status: DC
  Administered 2018-01-20 – 2018-01-22 (×6): 40 mg via INTRAVENOUS
  Filled 2018-01-20 (×6): qty 4

## 2018-01-20 MED ORDER — OCUVITE-LUTEIN PO CAPS
1.0000 | ORAL_CAPSULE | Freq: Every day | ORAL | Status: DC
  Administered 2018-01-20 – 2018-01-22 (×3): 1 via ORAL
  Filled 2018-01-20 (×4): qty 1

## 2018-01-20 NOTE — NC FL2 (Signed)
[place] LEVEL OF CARE SCREENING TOOL     IDENTIFICATION  Patient Name: Angela Pham Birthdate: Jun 21, 1941 Sex: female Admission Date (Current Location): 01/18/2018  [place] and [place] Number:  Engineering geologist and Address:  [facility], [address] [place], [place] [address]      Provider Number: [id number]  Attending Physician Name and Address:  Demetrios Loll, MD  Relative Name and Phone Number:  [name]- Niece  [phone]    Current Level of Care: Hospital Recommended Level of Care: [place] Prior Approval Number:    Date Approved/Denied:   PASRR Number: [id number]  Discharge Plan: SNF    Current Diagnoses: Patient Active Problem List   Diagnosis Date Noted  . CHF (congestive heart failure) ([place]) 11/11/2017  . Anemia 09/26/2017  . Monoclonal gammopathy 09/26/2017  . Acute respiratory failure ([place]) 08/24/2017  . Acute URI 08/20/2017  . Acute respiratory failure with hypoxia ([place]) 08/03/2017  . DNR (do not resuscitate) 07/02/2017  . Respiratory failure ([place]) 06/27/2017  . Chronic diastolic heart failure ([place]) 06/24/2017  . Asthma 06/24/2017  . Diabetes ([place]) 06/24/2017  . Essential hypertension 06/23/2017  . Hyperlipidemia, unspecified 06/23/2017  . CKD (chronic kidney disease) stage 3, GFR 30-59 ml/min (HCC) 06/23/2017  . Pressure injury of skin 06/12/2017  . COPD exacerbation ([place]) 05/28/2017  . SOB (shortness of breath)   . Chronic combined systolic and diastolic heart failure ([place]) 03/07/2017    Orientation RESPIRATION BLADDER Height & Weight     Self, Time, Situation, Place  Normal Continent Weight: 157 lb 4.8 oz (71.4 kg) Height:  4\' 11"  (149.9 cm)  BEHAVIORAL SYMPTOMS/MOOD NEUROLOGICAL BOWEL NUTRITION STATUS  (none) (None) Continent Diet([facility] )  AMBULATORY STATUS COMMUNICATION OF NEEDS Skin   Extensive Assist Verbally Normal                       Personal Care  Assistance Level of Assistance  Bathing, Feeding, Dressing Bathing Assistance: Limited assistance Feeding assistance: Independent Dressing Assistance: Limited assistance     Functional Limitations Info  (None)          SPECIAL CARE FACTORS FREQUENCY  PT (By licensed PT), OT (By licensed OT)     PT Frequency: 5 Days  OT Frequency: 5 Days             Contractures Contractures Info: Not present    Additional Factors Info  Code Status, Allergies Code Status Info: DNR Allergies Info: Ace Inhibitors, Shellfish           Current Medications (01/20/2018):  This is the current hospital active medication list Current Facility-Administered Medications  Medication Dose Route Frequency Provider Last Rate Last Dose  . 0.9 %  sodium chloride infusion  250 mL Intravenous PRN Demetrios Loll, MD      . acetaminophen (TYLENOL) tablet 650 mg  650 mg Oral Q6H PRN Demetrios Loll, MD       Or  . acetaminophen (TYLENOL) suppository 650 mg  650 mg Rectal Q6H PRN Demetrios Loll, MD      . albuterol (PROVENTIL) (2.5 MG/3ML) 0.083% nebulizer solution 2.5 mg  2.5 mg Nebulization Q2H PRN Demetrios Loll, MD   2.5 mg at 01/19/18 1733  . ALPRAZolam Duanne Moron) tablet 0.25 mg  0.25 mg Oral TID PRN Demetrios Loll, MD   0.25 mg at 01/19/18 2313  . arformoterol (BROVANA) nebulizer solution 15 mcg  15 mcg Nebulization BID Demetrios Loll, MD  15 mcg at 01/20/18 0800  . aspirin EC tablet 81 mg  81 mg Oral Daily Demetrios Loll, MD   81 mg at 01/20/18 0929  . atorvastatin (LIPITOR) tablet 40 mg  40 mg Oral Daily Demetrios Loll, MD   40 mg at 01/20/18 0929  . bisacodyl (DULCOLAX) EC tablet 5 mg  5 mg Oral Daily PRN Demetrios Loll, MD      . budesonide (PULMICORT) nebulizer solution 0.5 mg  0.5 mg Nebulization BID Wilhelmina Mcardle, MD   0.5 mg at 01/20/18 0752  . carvedilol (COREG) tablet 3.125 mg  3.125 mg Oral BID WC Christell Faith M, PA-C   3.125 mg at 01/20/18 0929  . furosemide (LASIX) injection 40 mg  40 mg Intravenous [id number] Minna Merritts,  MD      . guaiFENesin-dextromethorphan [facility]) 100-10 MG/5ML syrup 5 mL  5 mL Oral Q4H PRN Demetrios Loll, MD      . heparin injection 5,000 Units  5,000 Units Subcutaneous Q8H Demetrios Loll, MD   5,000 Units at 01/20/18 [phone]  . HYDROcodone-acetaminophen (NORCO/VICODIN) 5-325 MG per tablet 1-2 tablet  1-2 tablet Oral Q4H PRN Demetrios Loll, MD      . insulin aspart (novoLOG) injection 0-5 Units  0-5 Units Subcutaneous QHS Demetrios Loll, MD   2 Units at 01/18/18 2209  . insulin aspart (novoLOG) injection 0-9 Units  0-9 Units Subcutaneous TID WC Demetrios Loll, MD   2 Units at 01/20/18 1330  . loratadine (CLARITIN) tablet 10 mg  10 mg Oral Daily Demetrios Loll, MD   10 mg at 01/20/18 0929  . methylPREDNISolone sodium succinate (SOLU-MEDROL) 40 mg/mL injection 40 mg  40 mg Intravenous Q12H Demetrios Loll, MD   40 mg at 01/20/18 0552  . mirtazapine (REMERON) tablet 45 mg  45 mg Oral QHS Demetrios Loll, MD   45 mg at 01/19/18 2313  . montelukast (SINGULAIR) tablet 10 mg  10 mg Oral QHS Demetrios Loll, MD   10 mg at 01/19/18 2313  . ondansetron (ZOFRAN) injection 4 mg  4 mg Intravenous Q6H PRN Demetrios Loll, MD      . senna-docusate (Senokot-S) tablet 1 tablet  1 tablet Oral QHS PRN Demetrios Loll, MD      . senna-docusate (Senokot-S) tablet 2 tablet  2 tablet Oral BID Demetrios Loll, MD   2 tablet at 01/20/18 [phone]  . sodium chloride flush (NS) 0.9 % injection 3 mL  3 mL Intravenous Q12H Demetrios Loll, MD   3 mL at 01/20/18 0933  . sodium chloride flush (NS) 0.9 % injection 3 mL  3 mL Intravenous PRN Demetrios Loll, MD      . spironolactone (ALDACTONE) tablet 12.5 mg  12.5 mg Oral Daily Demetrios Loll, MD   12.5 mg at 01/20/18 0929  . tiotropium (SPIRIVA) inhalation capsule 18 mcg  18 mcg Inhalation Daily Demetrios Loll, MD   18 mcg at 01/20/18 9518     Discharge Medications: Please see discharge summary for a list of discharge medications.  Relevant Imaging Results:  Relevant Lab Results:   Additional Information [facility] patient, SSN  [id number]  Annamaria Boots, [place]

## 2018-01-20 NOTE — Care Management Note (Signed)
Case Management Note  Patient Details  Name: Angela Pham MRN: [id number] Date of Birth: 10/12/1940  Subjective/Objective:  Admitted to [facility] with the diagnosis of acute respiratory failure. Lives in the home. Niece is Angela Pham [phone] A member of [facility] since March 1st 2019. Goes on Monday and Friday.  [place] in the past, [facility] 07/2017.  COPD [place] in the past. Nebulizer in the home. Home oxygen in the past. CPAP in the home.  Prescriptions have been filled at [facility] in [place] in the past.                  Action/Plan: Informed per [name] that [facility] program wants her to go to [facility] at discharge.   Expected Discharge Date:                  Expected Discharge Plan:     In-House Referral:     Discharge planning Services     Post Acute Care Choice:    Choice offered to:     DME Arranged:    DME Agency:     HH Arranged:    HH Agency:     Status of Service:     If discussed at [employer], dates discussed:    Additional Comments:  Shelbie Ammons, RN MSN CCM Care Management [phone] 01/20/2018, 12:57 PM

## 2018-01-20 NOTE — Telephone Encounter (Signed)
Spoke with Angela Pham on 1 c [facility] scheduled fu 5/20 at 10 .  Patient will be notified to have cxr prior to appt

## 2018-01-20 NOTE — Care Management Important Message (Signed)
Important Message  Patient Details  Name: Angela Pham MRN: [id number] Date of Birth: 12-29-1940   Medicare Important Message Given:  Yes    [name] 01/20/2018, 11:13 AM

## 2018-01-20 NOTE — Progress Notes (Addendum)
Progress Note  Patient Name: Angela Pham Date of Encounter: 01/20/2018  Primary Cardiologist: Fletcher Anon  Subjective   Transferred from the ICU to the floor on 4/25. Had a difficult time with wheezing on the floor requiring nebs and restarting of IV steroids. Family brought the patient's CPAP from home which helped. Currently without wheezing, receiving a breathing treatment. SOB improving. No chest pain. Repeat CXR this morning pending. Documented UOP of 700 mL for the past 24 hours with a net - 1.4 L for the admission (uncertain accuracy). Weight down 5 pounds from 162-->157 pounds. Renal function continues to improve with gentle IV diuresis from 2.04-->1.82. Potassium stable and at goal. No CBC today. BP stable with decreasing of Coreg on 4/25.   Inpatient Medications    Scheduled Meds: . arformoterol  15 mcg Nebulization BID  . aspirin EC  81 mg Oral Daily  . atorvastatin  40 mg Oral Daily  . budesonide  0.5 mg Nebulization BID  . carvedilol  3.125 mg Oral BID WC  . furosemide  40 mg Intravenous BID  . heparin  5,000 Units Subcutaneous Q8H  . insulin aspart  0-5 Units Subcutaneous QHS  . insulin aspart  0-9 Units Subcutaneous TID WC  . loratadine  10 mg Oral Daily  . methylPREDNISolone (SOLU-MEDROL) injection  40 mg Intravenous Q12H  . mirtazapine  45 mg Oral QHS  . montelukast  10 mg Oral QHS  . senna-docusate  2 tablet Oral BID  . sodium chloride flush  3 mL Intravenous Q12H  . spironolactone  12.5 mg Oral Daily  . tiotropium  18 mcg Inhalation Daily   Continuous Infusions: . sodium chloride     PRN Meds: sodium chloride, acetaminophen **OR** acetaminophen, albuterol, ALPRAZolam, bisacodyl, guaiFENesin-dextromethorphan, HYDROcodone-acetaminophen, [DISCONTINUED] ondansetron **OR** ondansetron (ZOFRAN) IV, senna-docusate, sodium chloride flush   Vital Signs    Vitals:   01/19/18 1737 01/19/18 2020 01/20/18 0535 01/20/18 0646  BP: 139/82 (!) 144/84 112/71   Pulse: (!)  138 (!) 130 (!) 109   Resp: (!) 22 18 18    Temp:  98.2 F (36.8 C) 98.1 F (36.7 C)   TempSrc:  Axillary Oral   SpO2: 100% 96% 94%   Weight:    157 lb 4.8 oz (71.4 kg)  Height:        Intake/Output Summary (Last 24 hours) at 01/20/2018 0749 Last data filed at 01/19/2018 1200 Gross per 24 hour  Intake -  Output 700 ml  Net -700 ml   Filed Weights   01/18/18 1300 01/19/18 0507 01/20/18 0646  Weight: 160 lb 11.5 oz (72.9 kg) 162 lb 4.1 oz (73.6 kg) 157 lb 4.8 oz (71.4 kg)    Telemetry    NSR to sinus tachycardia with frequent PVCs - Personally Reviewed  ECG    n/a - Personally Reviewed  Physical Exam   GEN: No acute distress.   Neck: JVD difficult to assess secondary to body habitus. Cardiac: RRR, no murmurs, rubs, or gallops.  Respiratory: Diminished, though improved breath sounds bilaterally.  GI: Soft, nontender, non-distended.   MS: No edema; No deformity. Neuro:  Alert and oriented x 3; Nonfocal.  Psych: Normal affect.  Labs    Chemistry Recent Labs  Lab 01/16/18 0733 01/18/18 0751 01/18/18 1304 01/19/18 0505 01/20/18 0419  NA 139 136  --  141 141  K 4.1 5.0  --  4.3 4.0  CL 103 104  --  107 105  CO2 26 23  --  26  28  GLUCOSE 129* 295*  --  139* 178*  BUN 66* 63*  --  66* 57*  CREATININE 2.11* 2.18* 2.08* 2.04* 1.82*  CALCIUM 8.4* 8.5*  --  8.5* 8.8*  PROT 6.8  --   --   --   --   ALBUMIN 3.6  --   --   --   --   AST 19  --   --   --   --   ALT 7*  --   --   --   --   ALKPHOS 47  --   --   --   --   BILITOT 0.7  --   --   --   --   GFRNONAA 22* 21* 22* 23* 26*  GFRAA 25* 24* 26* 26* 30*  ANIONGAP 10 9  --  8 8     Hematology Recent Labs  Lab 01/16/18 0733 01/18/18 0751 01/19/18 0505  WBC 13.2* 20.0* 13.7*  RBC 3.42* 3.73* 3.26*  HGB 10.5* 11.3* 9.8*  HCT 30.4* 33.9* 29.6*  MCV 88.9 90.8 90.8  MCH 30.5 30.3 30.1  MCHC 34.3 33.3 33.1  RDW 15.4* 16.0* 15.9*  PLT 279 353 250    Cardiac Enzymes Recent Labs  Lab 01/16/18 0733  01/18/18 0751 01/19/18 0801 01/19/18 1053  TROPONINI 0.05* 0.04* 0.28* 0.26*   No results for input(s): TROPIPOC in the last 168 hours.   BNP Recent Labs  Lab 01/16/18 0734 01/18/18 1449  BNP 319.0* 813.0*     DDimer No results for input(s): DDIMER in the last 168 hours.   Radiology    Dg Chest Portable 1 View  Result Date: 01/18/2018 IMPRESSION: Pulmonary vascular congestion with slight bibasilar interstitial edema. Minimal right pleural effusion with mild right base atelectasis. No consolidation. No adenopathy evident. Electronically Signed   By: Lowella Grip III M.D.   On: 01/18/2018 08:01    Cardiac Studies   TTE with bubble study 05/2017: Study Conclusions  - Left ventricle: Systolic function was moderately reduced. The estimated ejection fraction was in the range of 35% to 40%. - Aortic valve: There was mild regurgitation. - Mitral valve: There was moderate regurgitation. - Left atrium: The atrium was mildly dilated. - Atrial septum: Echo contrast study showed no right-to-left atrial level shunt, at baseline or with provocation.   R/LHC 02/2017: Coronary angiography:  Coronary dominance: Right  Left mainstem: Large vessel that bifurcates into the LAD and left circumflex, no significant disease noted  Left anterior descending (LAD): Large vessel that extends to the apical region, diagonal branch 2 of moderate size, no significant disease noted  Left circumflex (LCx): Large vessel with OM branch 2, no significant disease noted  Right coronary artery (RCA): Right dominant vessel with PL and PDA, no significant disease noted  Left ventriculography: Not performed as she had recent echo and has underlying CRI  Final Conclusions:  Normal coronary arteries  Right heart pressures: RA: mean 22 RV: 53/18/22 RVEDP: 22 LVEDP 38-43 Unable to advance catheter to wedge pressure despite multiple attempts   Recommendations:  Normal coronary  arteries Wall motion secondary to LBBB Markedly elevated LVEDP Elevated right atrial pressures Numbers consistent with chronic diastolic CHF Blood pressure markedly elevated, tachycardic   Patient Profile     77 y.o. female with history of nonobstructive CAD by [facility] 02/2017, chronic combined CHF, PAH, COPD, HTN, asthma, DM2, and HLDwho was admitted with acute on chronic respiratory failure that was felt to be multifactorial as  below.  Assessment & Plan    1. Acute on chronic respiratory failure with hypoxia: -Likely multifactorial includingAECOPD, acute on chronic combined CHF and PAH -Continues to require nighttime BiPAP, now on nasal cannula at 3 L, wean as able per PCCM -Supportive care  2. Acute on chronic combined CHF/PAH: -She does continue to appear mildly volume up -Continue IV Lasix to 40 mg bid   -BNP elevated at 813, which is up from 319 on 4/22 -Recent echo in 05/2017, no need to repeat at this time -Coreg and spironolactone -Not on ACEi/ARB given history of angioedema  -CHF education  -Strict Is and Os, daily weights  3. CKD stage III: -Improving with IV diuresis  -Monitor with diuresis   4. Elevated troponin/nonobstructive CAD: -Minimally elevated likely in the setting of supply demand ischemia secondary to AECOPD, volume overload, and CKD -Continue to cycle to rule out -Would not start heparin gtt unless there is dynamic elevation  -ASA  5. Frequent PVCs: -Coreg as above -Asymptomatic -Replete magnesium to goal > 2.0 -Potassium at goal -Check TSH  6. AECOPD: -Nebs per IM -Will need a longer course of steroids (restarted on 4/25 given worsening COPD) -Consider ABX  7. Leukocytosis: -Possibly fromAECOPD/steroids  -No CBC today -Per IM  8. HLD: -Lipitor   9. DM2: -Per IM  10. HTN: -Stable -Continue lower dose Coreg to 3.125 mg bid (had to be decreased secondary to soft BP in the ICU)   For questions or updates, please contact  [place] Please consult [email or web address] for contact info under Cardiology/STEMI.    Signed, Christell Faith, PA-C [place] Pager: [phone] 01/20/2018, 7:49 AM   Attending Note Patient seen and examined, agree with detailed note above,  Patient presentation and plan discussed on rounds.   Difficult night overnight with worsening wheezing, shortness of breath Even on CPAP did not have relief Given multiple nebulizer treatments, restarted on steroids Given benzodiazepines for anxiety Eventually able to get her breathing under control This morning more comfortable.  Reports symptoms were terrible, consistent with what brought her into the hospital in the first place  On physical exam unable to estimate JVD, lungs clear with scant wheezes, rales at the bases, heart sounds regular normal S1-S2 tachycardic, unable to appreciate any murmurs, abdomen obese soft nontender, no significant lower extremity edema  Lab work reviewed creatinine improving 1.82 BUN 57 potassium 4.0 normal TSH   A/P: 1 Acute respiratory distress Multifactorial, chronic diastolic CHF, underlying lung disease  narrow therapeutic window Would continue aggressive IV diuretic Lasix 40 Iv increased up to Q8 hr  -Through the pace program (outpt)she will require torsemide 40 po BID and metolazone 2.5  as needed for SOB, 3 lb weight gain   2. Sinus tachycardia On Carvedilol, heart rate improving down to 100 Not a good candidate for calcium channel blockers given her systolic dysfunction unable to exclude tachycardia mediated cardiomyopathy Consider changing to bisoprolol  3. Chronic kidney disease Improving with diuresis this week We will continue to monitor  4. Diastolic CHF/pulmonary hypertension Continue Lasix IV, spironolactone, Coreg Increase Lasix as above given continued severe symptoms worse at night when supine  5. COPD Steroids held by intensivist On inhalers   Long discussion  with her today concerning events of last night, treatment options, going forward Greater than 50% was spent in counseling and coordination of care with patient Total encounter time 35 minutes or more   Signed: Esmond Plants  M.D., Ph.D. [facility]

## 2018-01-20 NOTE — Telephone Encounter (Signed)
-----   Message from [facility] [place] sent at 0/96/2836  2:44 PM EDT ----- Please schedule hosp f/u in 30 minute slot with DS in 3-4 weeks. Please inform pt she needs to go to [facility] to get CXR no more than 2 days prior to the appt.  Thanks, Misty   ----- Message ----- From: Wilhelmina Mcardle, MD Sent: 01/19/2018   2:18 PM To: Renelda Mom, LPN  Please schedule office follow up with me in 3-4 weeks with CXR prior  Thanks  Waunita Schooner

## 2018-01-20 NOTE — Progress Notes (Signed)
[facility] [place] NAME: Angela Pham    MR#:  [id number]  DATE OF BIRTH:  02/05/1941  SUBJECTIVE:  CHIEF COMPLAINT:   Chief Complaint  Patient presents with  . Respiratory Distress   The patient feels better but is still wheezing and the cough this morning, on oxygen by nasal cannula 3 L REVIEW OF SYSTEMS:  Review of Systems  Constitutional: Positive for malaise/fatigue. Negative for chills and fever.  HENT: Negative for sore throat.   Eyes: Negative for blurred vision and double vision.  Respiratory: Positive for cough, sputum production, shortness of breath and wheezing. Negative for hemoptysis and stridor.   Cardiovascular: Negative for chest pain, palpitations, orthopnea and leg swelling.  Gastrointestinal: Negative for abdominal pain, blood in stool, diarrhea, melena, nausea and vomiting.  Genitourinary: Negative for dysuria, flank pain and hematuria.  Musculoskeletal: Negative for back pain and joint pain.  Skin: Negative for rash.  Neurological: Negative for dizziness, sensory change, focal weakness, seizures, loss of consciousness, weakness and headaches.  Endo/Heme/Allergies: Negative for polydipsia.  Psychiatric/Behavioral: Negative for depression. The patient is not nervous/anxious.     DRUG ALLERGIES:   Allergies  Allergen Reactions  . Ace Inhibitors Anaphylaxis    Tongue swelling  . Shellfish Allergy Hives and Swelling    Swelling around face and mouth    VITALS:  Blood pressure 112/71, pulse (!) 109, temperature 98.1 F (36.7 C), temperature source Oral, resp. rate 18, height 4\' 11"  (1.499 m), weight 157 lb 4.8 oz (71.4 kg), SpO2 94 %. PHYSICAL EXAMINATION:  Physical Exam  Constitutional: She is oriented to person, place, and time.  HENT:  Head: Normocephalic.  Mouth/Throat: Oropharynx is clear and moist.  Eyes: Pupils are equal, round, and reactive to light. Conjunctivae and EOM are normal. No scleral  icterus.  Neck: Normal range of motion. Neck supple. No JVD present. No tracheal deviation present.  Cardiovascular: Normal rate, regular rhythm and normal heart sounds. Exam reveals no gallop.  No murmur heard. Pulmonary/Chest: Effort normal. No respiratory distress. She has wheezes. She has rales.  Abdominal: Soft. Bowel sounds are normal. She exhibits no distension. There is no tenderness. There is no rebound.  Musculoskeletal: Normal range of motion. She exhibits no edema or tenderness.  Neurological: She is alert and oriented to person, place, and time. No cranial nerve deficit.  Skin: No rash noted. No erythema.   LABORATORY PANEL:  Female CBC Recent Labs  Lab 01/19/18 0505  WBC 13.7*  HGB 9.8*  HCT 29.6*  PLT 250   ------------------------------------------------------------------------------------------------------------------ Chemistries  Recent Labs  Lab 01/16/18 0733  01/19/18 1642 01/20/18 0419  NA 139   < >  --  141  K 4.1   < >  --  4.0  CL 103   < >  --  105  CO2 26   < >  --  28  GLUCOSE 129*   < >  --  178*  BUN 66*   < >  --  57*  CREATININE 2.11*   < >  --  1.82*  CALCIUM 8.4*   < >  --  8.8*  MG  --    < > 1.8  --   AST 19  --   --   --   ALT 7*  --   --   --   ALKPHOS 47  --   --   --   BILITOT 0.7  --   --   --    < > =  values in this interval not displayed.   RADIOLOGY:  Dg Chest 2 View  Result Date: 01/20/2018 CLINICAL DATA:  Shortness of breath EXAM: CHEST - 2 VIEW COMPARISON:  January 18, 2018 FINDINGS: There is a small left pleural effusion with mild left base atelectasis. There is no edema or consolidation. There is cardiomegaly with mild pulmonary venous hypertension. No adenopathy. No evident bone lesions. There is mild degenerative change in the thoracic spine. IMPRESSION: Pulmonary vascular congestion. No frank edema or consolidation. There is a small left pleural effusion with left base atelectasis. Electronically Signed   By: Lowella Grip III M.D.   On: 01/20/2018 08:49   ASSESSMENT AND PLAN:   Acute on chronic respiratory failure with hypoxia due to acute on chronic diastolic CHF and COPD exacerbation. On O2 [place] 3 L, off BiPAP.   Acute on chronic diastolic CHF. Lasix 40 Iv increased up to Q8 hr per Dr. Rockey Situ. Hold torsemide, continue spironolactone and Coreg if blood pressure allows.  COPD exacerbation. Per Dr. Alva Garnet, discontinued IV Solu-Medrol, continue albuterol prn.  Robitussin as needed. Since the patient still has shortness of breath and wheezing, tachycardia.  I resumed IV Solu-Medrol, albuterol every 6 hours.  The patient feels better.  Elevated troponin, demanding ischemia due to above.  Continue aspirin.  Leukocytosis.  Unclear etiology, improving.  Pulmonary hypertension.  Follow-up pulmonary physician as outpatient.  CKD stage IV.  Stable. Follow-up BMP while on Lasix.  Diabetes.  on sliding scale.  Discussed with Dr. Rockey Situ. All the records are reviewed and case discussed with Care Management/Social Worker. Management plans discussed with the patient, family and they are in agreement.  CODE STATUS: DNR  TOTAL TIME TAKING CARE OF THIS PATIENT: 36 minutes.   More than 50% of the time was spent in counseling/coordination of care: YES  POSSIBLE D/C IN 2-3 DAYS, DEPENDING ON CLINICAL CONDITION.   Demetrios Loll M.D on 01/20/2018 at 2:45 PM  Between 7am to 6pm - Pager - [phone]  After 6pm go to [email or web address] - [facility]

## 2018-01-20 NOTE — Clinical Social Work Note (Addendum)
Clinical Social Work Assessment  Patient Details  Name: Angela Pham MRN: [id number] Date of Birth: 1941-08-03  Date of referral:  01/20/18               Reason for consult:  Discharge Planning                Permission sought to share information with:  Facility Sport and exercise psychologist, Family Supports Permission granted to share information::  Yes, Verbal Permission Granted  Name::       SNF  Agency::   [facility]   Relationship::     Contact Information:     Housing/Transportation Living arrangements for the past 2 months:  [place] of Information:  Patient Patient Interpreter Needed:  None Criminal Activity/Legal Involvement Pertinent to Current Situation/Hospitalization:  No - Comment as needed Significant Relationships:  Other Family Members Lives with:  Self, Other (Comment)(Niece- [name] ) Do you feel safe going back to the place where you live?  Yes Need for family participation in patient care:  Yes (Comment)  Care giving concerns:  Patient lives with her niece [name] [phone] in [place]. She has been followed by [facility] prior to this admission.    Social Worker assessment / plan:  Holiday representative ([place]) received verbal consult from RN case manager that [facility] is requesting that patient goes [facility] at discharge. CSW met with patient and discussed discharge plan. Patient is in agreement with SNF placement at discharge. She has already been followed by [facility] and would like to go to [facility] if a bed is available. Dr. Orpah Melter with [facility] was in the room when CSW met with patient. She stated that [facility] recommends [facility]. CSW completed FL2 and sent to [facility]. Patient already had an existing PASRR.   Per Neoma Laming admissions coordinator at [facility] they can accept patient and will coordinate with [facility]. Sharita from [facility] is aware of above.   Employment status:  Retired Forensic scientist:  Other  (Comment Required)([facility]) PT Recommendations:  [place] / Referral to community resources:  [facility], [place]  Patient/Family's Response to care:  Patient is in agreement with SNF placement   Patient/Family's Understanding of and Emotional Response to Diagnosis, Current Treatment, and Prognosis:  Patient is in agreement with discharge plan. CSW attempted to call niece [name] [phone] to inform her of discharge plan as well but she was unavailable.   Emotional Assessment Appearance:  Appears stated age Attitude/Demeanor/Rapport:    Affect (typically observed):  Accepting, Pleasant Orientation:  Oriented to Self, Oriented to Place, Oriented to  Time, Oriented to Situation Alcohol / Substance use:  Not Applicable Psych involvement (Current and /or in the community):  No (Comment)  Discharge Needs  Concerns to be addressed:  Discharge Planning Concerns Readmission within the last 30 days:  No Current discharge risk:  Dependent with Mobility Barriers to Discharge:  Continued Medical Work up   [employer], [place] 01/20/2018, 2:08 PM

## 2018-01-20 NOTE — Clinical Social Work Placement (Signed)
   CLINICAL SOCIAL WORK PLACEMENT  NOTE  Date:  01/20/2018  Patient Details  Name: Angela Pham MRN: [id number] Date of Birth: May 14, 1941  Clinical Social Work is seeking post-discharge placement for this patient at the [place] level of care (*CSW will initial, date and re-position this form in  chart as items are completed):  Yes   Patient/family provided with [place] Work Department's list of facilities offering this level of care within the geographic area requested by the patient (or if unable, by the patient's family).  Yes   Patient/family informed of their freedom to choose among providers that offer the needed level of care, that participate in Medicare, Medicaid or managed care program needed by the patient, have an available bed and are willing to accept the patient.  Yes   Patient/family informed of [place] [facility]'s ownership interest in [facility] and [facility], as well as of the fact that they are under no obligation to receive care at these facilities.  PASRR submitted to E[facility] on 01/20/18     PASRR number received on       Existing PASRR number confirmed on 01/20/18     FL2 transmitted to all facilities in geographic area requested by pt/family on       FL2 transmitted to all facilities within larger geographic area on 01/20/18     Patient informed that his/her managed care company has contracts with or will negotiate with certain facilities, including the following:            Patient/family informed of bed offers received.  Patient chooses bed at       Physician recommends and patient chooses bed at      Patient to be transferred to   on  .  Patient to be transferred to facility by       Patient family notified on   of transfer.  Name of family member notified:        PHYSICIAN       Additional Comment:    _______________________________________________ Annamaria Boots, [place] 01/20/2018, 2:04 PM

## 2018-01-20 NOTE — Progress Notes (Signed)
Advanced Care Plan.  Purpose of Encounter: Palliative care Parties in Attendance: The patient and me. Patient's Decisional Capacity: Yes. /Medical Story: Angela Pham  is a 77 y.o. female with a known history of multiple medical problems including chronic respiratory failure, CHF, COPD, pulmonary hypertension, hypertension, diabetes, hyperlipidemia and OSA.  The patient is admitted for acute on chronic respiratory failure with hypoxia due to CHF and COPD exacerbation.  I discussed with the patient about her current condition, poor prognosis and palliative care.  She agrees to get palliative care consult. Goals of Care Determinations: Palliative care. Plan:  Code Status: DNR. Time spent discussing advance care planning: 17 minutes.

## 2018-01-21 DIAGNOSIS — J9601 Acute respiratory failure with hypoxia: Secondary | ICD-10-CM

## 2018-01-21 LAB — BASIC METABOLIC PANEL
Anion gap: 9 (ref 5–15)
BUN: 71 mg/dL — ABNORMAL HIGH (ref 6–20)
CO2: 27 mmol/L (ref 22–32)
CREATININE: 1.73 mg/dL — AB (ref 0.44–1.00)
Calcium: 8.6 mg/dL — ABNORMAL LOW (ref 8.9–10.3)
Chloride: 103 mmol/L (ref 101–111)
GFR calc Af Amer: 32 mL/min — ABNORMAL LOW (ref >60)
GFR, EST NON AFRICAN AMERICAN: 28 mL/min — AB (ref >60)
GLUCOSE: 191 mg/dL — AB (ref 65–99)
Potassium: 4.3 mmol/L (ref 3.5–5.1)
SODIUM: 139 mmol/L (ref 135–145)

## 2018-01-21 LAB — GLUCOSE, CAPILLARY
GLUCOSE-CAPILLARY: 152 mg/dL — AB (ref 65–99)
GLUCOSE-CAPILLARY: 162 mg/dL — AB (ref 65–99)
GLUCOSE-CAPILLARY: 232 mg/dL — AB (ref 65–99)
Glucose-Capillary: 256 mg/dL — ABNORMAL HIGH (ref 65–99)

## 2018-01-21 MED ORDER — SPIRONOLACTONE 25 MG PO TABS: 25.0000 mg | ORAL_TABLET | Freq: Every day | ORAL | 0 refills | Status: DC

## 2018-01-21 MED ORDER — GUAIFENESIN-DM 100-10 MG/5ML PO SYRP: 5.0000 mL | ORAL_SOLUTION | ORAL | 0 refills | Status: AC | PRN

## 2018-01-21 MED ORDER — MAGNESIUM HYDROXIDE 400 MG/5ML PO SUSP
15.0000 mL | Freq: Two times a day (BID) | ORAL | Status: DC | PRN
  Administered 2018-01-21: 12:00:00 15 mL via ORAL
  Filled 2018-01-21 (×2): qty 30

## 2018-01-21 MED ORDER — LACTULOSE 10 GM/15ML PO SOLN
30.0000 g | Freq: Two times a day (BID) | ORAL | Status: AC
  Administered 2018-01-21: 15:00:00 30 g via ORAL
  Filled 2018-01-21 (×2): qty 60

## 2018-01-21 MED ORDER — PREDNISONE 50 MG PO TABS: ORAL_TABLET | ORAL | 0 refills | Status: DC

## 2018-01-21 MED ORDER — CARVEDILOL 3.125 MG PO TABS
6.2500 mg | ORAL_TABLET | Freq: Two times a day (BID) | ORAL | Status: DC
  Administered 2018-01-21 – 2018-01-22 (×3): 6.25 mg via ORAL
  Filled 2018-01-21 (×2): qty 2

## 2018-01-21 NOTE — Discharge Summary (Addendum)
[facility] NAME: Angela Pham    MR#:  [id number]  DATE OF BIRTH:  10/31/1940  DATE OF ADMISSION:  01/18/2018 ADMITTING PHYSICIAN: Demetrios Loll, MD  DATE OF DISCHARGE: No discharge date for patient encounter.  PRIMARY CARE PHYSICIAN: Gareth Morgan, MD    ADMISSION DIAGNOSIS:  Respiratory distress [R06.03] Acute on chronic respiratory failure with hypercapnia (HCC) [J96.22] Stage 4 chronic kidney disease ([place]) [N18.4]  DISCHARGE DIAGNOSIS:  Active Problems:   Acute respiratory failure with hypoxia ([place])   SECONDARY DIAGNOSIS:   Past Medical History:  Diagnosis Date  . Asthma   . Chronic combined systolic and diastolic CHF (congestive heart failure) ([place])    a. TTE 02/2017: EF 45-50%, basal amd midanteroseptal HK b. 05/2017: echo showing EF of 35-40%, mild AI, moderate MR, and mildly dilated LA.   Marland Kitchen Coronary artery disease, non-occlusive    a. LHC 02/2017 normal coronary arteries  . Hyperlipidemia   . Hypertension   . Morbid obesity ([place])   . OSA (obstructive sleep apnea)    a. On CPAP w/ O2 bleed in.  . Pulmonary hypertension ([place])   . Type II diabetes mellitus ([place])     HOSPITAL COURSE:  Acute on chronic respiratory failure with hypoxia due to acute on chronic diastolic CHF and COPD exacerbation. On O2 [place] 3 L, off BiPAP.   Acute on chronic diastolic CHF Resolved Treated on our congestive heart failure protocol, provided IV Lasix while in house, cardiology to see patient while in-house, restarted on torsemide at discharge, increase spironolactone, placed on low-sodium fluid restricted diet, and to follow-up with cardiology/congestive heart failure clinic status post discharge for continued care  COPD exacerbation Resolved Successfully weaned off oxygen during the day-is on oxygen 2 L at night with CPAP, currently off oxygen She did not house with IV Solu-Medrol which was subsequently discontinued, will be  discharged on p.o. prednisone, breathing treatments, and to follow-up with pulmonology status post discharge for reevaluation  Elevated troponin Secondary to demand ischemia due to above Continue aspirin.  Leukocytosis Resolved Secondary to above  Pulmonary hypertension Stable Follow-up with pulmonology/cardiology status post discharge for continued care  CKD stage IV Stable on current regiment  Diabetes Controlled on current regiment  DISCHARGE CONDITIONS:  On the day of discharge patient is afebrile, hemodynamic is stable, weaned off oxygen-though patient is on 2 L at night, follow-up with primary care provider and cardiology as directed, for more specific details please see chart, resume home oxygen as well as CPAP, for SNF   CONSULTS OBTAINED:  Treatment Team:  Minna Merritts, MD Wilhelmina Mcardle, MD  DRUG ALLERGIES:   Allergies  Allergen Reactions  . Ace Inhibitors Anaphylaxis    Tongue swelling  . Shellfish Allergy Hives and Swelling    Swelling around face and mouth     DISCHARGE MEDICATIONS:   Allergies as of 01/21/2018      Reactions   Ace Inhibitors Anaphylaxis   Tongue swelling   Shellfish Allergy Hives, Swelling   Swelling around face and mouth      Medication List    TAKE these medications   arformoterol 15 MCG/2ML Nebu Commonly known as:  BROVANA Take 2 mLs (15 mcg total) 2 (two) times daily by nebulization. DX: COPD J44.9   aspirin 81 MG EC tablet Take 1 tablet (81 mg total) by mouth daily.   atorvastatin 40 MG tablet Commonly known as:  LIPITOR Take 40 mg  by mouth daily.   budesonide 0.25 MG/2ML nebulizer solution Commonly known as:  PULMICORT Take 2 mLs (0.25 mg total) by nebulization 2 (two) times daily. DX: COPD J44.9   carvedilol 6.25 MG tablet Commonly known as:  COREG Take 1 tablet (6.25 mg total) by mouth daily. What changed:  when to take this   EYE VITAMINS Caps Take 1 tablet by mouth 2 (two) times daily.    guaiFENesin-dextromethorphan 100-10 MG/5ML syrup Commonly known as:  ROBITUSSIN DM Take 5 mLs by mouth every 4 (four) hours as needed for cough.   levocetirizine 5 MG tablet Commonly known as:  XYZAL Take 5 mg by mouth daily.   metFORMIN 1000 MG tablet Commonly known as:  GLUCOPHAGE Take 1 tablet by mouth 2 (two) times daily.   mirtazapine 45 MG tablet Commonly known as:  REMERON Take 1 tablet by mouth at bedtime.   montelukast 10 MG tablet Commonly known as:  SINGULAIR Take 10 mg by mouth at bedtime.   potassium chloride SA 20 MEQ tablet Commonly known as:  K-DUR,KLOR-CON Take 1 tablet (20 mEq total) by mouth daily.   predniSONE 50 MG tablet Commonly known as:  DELTASONE 1 po daily   salmeterol 50 MCG/DOSE diskus inhaler Commonly known as:  SEREVENT DISKUS Inhale 1 puff into the lungs 2 (two) times daily.   senna-docusate 8.6-50 MG tablet Commonly known as:  Senokot-S Take 2 tablets by mouth 2 (two) times daily.   spironolactone 25 MG tablet Commonly known as:  ALDACTONE Take 1 tablet (25 mg total) by mouth daily. What changed:  how much to take   tiotropium 18 MCG inhalation capsule Commonly known as:  SPIRIVA HANDIHALER Place 1 capsule (18 mcg total) into inhaler and inhale daily.   torsemide 20 MG tablet Commonly known as:  DEMADEX Take 1 tablet (20 mg total) by mouth 2 (two) times daily.        DISCHARGE INSTRUCTIONS:   If you experience worsening of your admission symptoms, develop shortness of breath, life threatening emergency, suicidal or homicidal thoughts you must seek medical attention immediately by calling 911 or calling your MD immediately  if symptoms less severe.  You Must read complete instructions/literature along with all the possible adverse reactions/side effects for all the Medicines you take and that have been prescribed to you. Take any new Medicines after you have completely understood and accept all the possible adverse  reactions/side effects.   Please note  You were cared for by a hospitalist during your hospital stay. If you have any questions about your discharge medications or the care you received while you were in the hospital after you are discharged, you can call the unit and asked to speak with the hospitalist on call if the hospitalist that took care of you is not available. Once you are discharged, your primary care physician will handle any further medical issues. Please note that NO REFILLS for any discharge medications will be authorized once you are discharged, as it is imperative that you return to your primary care physician (or establish a relationship with a primary care physician if you do not have one) for your aftercare needs so that they can reassess your need for medications and monitor your lab values.    Today   CHIEF COMPLAINT:   Chief Complaint  Patient presents with  . Respiratory Distress    HISTORY OF PRESENT ILLNESS:  77 y.o. female with a known history of multiple medical problems as below and the  recurrent admission.  The patient presented to the ED base above chief complaints.  She has had these symptoms for several days, which has been worsening today.  She was seen in ED 2 days ago for shortness of breath and the treated for COPD exacerbation.  She use home nebulizer without any improvement.  She was found in the respiratory distress, wheezing and hypoxia at the 80% the room air and put on CPAP.  She is treated with nebulizer and put on BiPAP in ED. chest x-ray showed pulmonary edema. VITAL SIGNS:  Blood pressure 109/80, pulse (!) 102, temperature (!) 97.4 F (36.3 C), temperature source Oral, resp. rate 18, height 4\' 11"  (1.499 m), weight 72.5 kg (159 lb 13.3 oz), SpO2 97 %.  I/O:    Intake/Output Summary (Last 24 hours) at 01/21/2018 1147 Last data filed at 01/21/2018 0900 Gross per 24 hour  Intake 240 ml  Output 1800 ml  Net -1560 ml    PHYSICAL EXAMINATION:   GENERAL:  77 y.o.-year-old patient lying in the bed with no acute distress.  EYES: Pupils equal, round, reactive to light and accommodation. No scleral icterus. Extraocular muscles intact.  HEENT: Head atraumatic, normocephalic. Oropharynx and nasopharynx clear.  NECK:  Supple, no jugular venous distention. No thyroid enlargement, no tenderness.  LUNGS: Normal breath sounds bilaterally, no wheezing, rales,rhonchi or crepitation. No use of accessory muscles of respiration.  CARDIOVASCULAR: S1, S2 normal. No murmurs, rubs, or gallops.  ABDOMEN: Soft, non-tender, non-distended. Bowel sounds present. No organomegaly or mass.  EXTREMITIES: No pedal edema, cyanosis, or clubbing.  NEUROLOGIC: Cranial nerves II through XII are intact. Muscle strength 5/5 in all extremities. Sensation intact. Gait not checked.  PSYCHIATRIC: The patient is alert and oriented x 3.  SKIN: No obvious rash, lesion, or ulcer.   DATA REVIEW:   CBC Recent Labs  Lab 01/19/18 0505  WBC 13.7*  HGB 9.8*  HCT 29.6*  PLT 250    Chemistries  Recent Labs  Lab 01/16/18 0733  01/19/18 1642  01/21/18 0515  NA 139   < >  --    < > 139  K 4.1   < >  --    < > 4.3  CL 103   < >  --    < > 103  CO2 26   < >  --    < > 27  GLUCOSE 129*   < >  --    < > 191*  BUN 66*   < >  --    < > 71*  CREATININE 2.11*   < >  --    < > 1.73*  CALCIUM 8.4*   < >  --    < > 8.6*  MG  --    < > 1.8  --   --   AST 19  --   --   --   --   ALT 7*  --   --   --   --   ALKPHOS 47  --   --   --   --   BILITOT 0.7  --   --   --   --    < > = values in this interval not displayed.    Cardiac Enzymes Recent Labs  Lab 01/19/18 1053  TROPONINI 0.26*    Microbiology Results  Results for orders placed or performed during the hospital encounter of 01/18/18  MRSA PCR Screening     Status: None  Collection Time: 01/18/18  1:07 PM  Result Value Ref Range Status   MRSA by PCR NEGATIVE NEGATIVE Final    Comment:        The GeneXpert MRSA  Assay (FDA approved for NASAL specimens only), is one component of a comprehensive MRSA colonization surveillance program. It is not intended to diagnose MRSA infection nor to guide or monitor treatment for MRSA infections. Performed at [facility], [address] [place], [place] [address]     RADIOLOGY:  Dg Chest 2 View  Result Date: 01/20/2018 CLINICAL DATA:  Shortness of breath EXAM: CHEST - 2 VIEW COMPARISON:  January 18, 2018 FINDINGS: There is a small left pleural effusion with mild left base atelectasis. There is no edema or consolidation. There is cardiomegaly with mild pulmonary venous hypertension. No adenopathy. No evident bone lesions. There is mild degenerative change in the thoracic spine. IMPRESSION: Pulmonary vascular congestion. No frank edema or consolidation. There is a small left pleural effusion with left base atelectasis. Electronically Signed   By: Lowella Grip III M.D.   On: 01/20/2018 08:49    EKG:   Orders placed or performed during the hospital encounter of 01/18/18  . EKG 12-Lead  . EKG 12-Lead      Management plans discussed with the patient, family and they are in agreement.  CODE STATUS:     Code Status Orders  (From admission, onward)        Start     Ordered   01/18/18 1207  Do not attempt resuscitation (DNR)  Continuous    Question Answer Comment  In the event of cardiac or respiratory ARREST Do not call a "code blue"   In the event of cardiac or respiratory ARREST Do not perform Intubation, CPR, defibrillation or ACLS   In the event of cardiac or respiratory ARREST Use medication by any route, position, wound care, and other measures to relive pain and suffering. May use oxygen, suction and manual treatment of airway obstruction as needed for comfort.   Comments RN may pronounce      01/18/18 1206    Code Status History    Date Active Date Inactive Code Status Order ID Comments User Context   11/11/2017 0613 11/12/2017  1647 DNR [id number]  Saundra Shelling, MD Inpatient   08/24/2017 1245 08/26/2017 1907 DNR [id number]  Nicholes Mango, MD ED   08/20/2017 1249 08/23/2017 2142 DNR [id number]  Idelle Crouch, MD Inpatient   08/03/2017 1409 08/06/2017 1912 Full Code [id number]  Henreitta Leber, MD ED   06/27/2017 0429 07/04/2017 1659 DNR [id number]  Saundra Shelling, MD Inpatient   06/13/2017 0425 06/16/2017 1816 DNR [id number]  Mikael Spray, NP Inpatient   06/12/2017 0328 06/13/2017 0425 Full Code [id number]  [place], [place], DO Inpatient   06/12/2017 0200 06/12/2017 0328 DNR [id number]  Harvie Bridge, DO ED   05/29/2017 0249 06/02/2017 2123 DNR [id number]  Verlin Dike, RN Inpatient   05/28/2017 2259 05/29/2017 0249 Full Code [id number]  [place], [place], DO Inpatient   05/04/2017 1719 05/08/2017 1718 DNR [id number]  Loletha Grayer, MD ED   03/09/2017 1234 03/11/2017 2208 Full Code [id number]  Minna Merritts, MD Inpatient   03/07/2017 0512 03/09/2017 1234 Full Code [id number]  Saundra Shelling, MD Inpatient    Advance Directive Documentation     Most Recent Value  Type of Advance Directive  Living will  Pre-existing out of facility DNR order (yellow form or pink MOST form)  -  "MOST" Form in  Place?  -      TOTAL TIME TAKING CARE OF THIS PATIENT: 45 minutes.    Avel Peace Maycee Blasco M.D on 01/21/2018 at 11:47 AM  Between 7am to 6pm - Pager - [phone]  After 6pm go to [email or web address] - password [email or web address] [facility]  Office  [phone]  CC: Primary care physician; Gareth Morgan, MD   Note: This dictation was prepared with Dragon dictation along with smaller phrase technology. Any transcriptional errors that result from this process are unintentional.

## 2018-01-21 NOTE — Clinical Social Work Note (Signed)
The patient will discharge to [facility] tomorrow pending medical clearance as the facility does not have her medications from [facility] and will receive them tomorrow. CSW has updated the attending RN and MD. CSW is following.  Santiago Bumpers, MSW, Latanya Presser [phone]

## 2018-01-21 NOTE — Progress Notes (Signed)
Progress Note  Patient Name: Angela Pham Date of Encounter: 01/21/2018  Primary Cardiologist: Kathlyn Sacramento, MD   Subjective   "I feel better"  Inpatient Medications    Scheduled Meds: . arformoterol  15 mcg Nebulization BID  . aspirin EC  81 mg Oral Daily  . atorvastatin  40 mg Oral Daily  . budesonide  0.5 mg Nebulization BID  . carvedilol  6.25 mg Oral BID WC  . furosemide  40 mg Intravenous [id number]  . heparin  5,000 Units Subcutaneous Q8H  . insulin aspart  0-5 Units Subcutaneous QHS  . insulin aspart  0-9 Units Subcutaneous TID WC  . loratadine  10 mg Oral Daily  . methylPREDNISolone (SOLU-MEDROL) injection  40 mg Intravenous Q12H  . mirtazapine  45 mg Oral QHS  . montelukast  10 mg Oral QHS  . multivitamin-lutein  1 capsule Oral Daily  . senna-docusate  2 tablet Oral BID  . sodium chloride flush  3 mL Intravenous Q12H  . spironolactone  12.5 mg Oral Daily  . tiotropium  18 mcg Inhalation Daily   Continuous Infusions: . sodium chloride     PRN Meds: sodium chloride, acetaminophen **OR** acetaminophen, albuterol, ALPRAZolam, bisacodyl, guaiFENesin-dextromethorphan, HYDROcodone-acetaminophen, magnesium hydroxide, [DISCONTINUED] ondansetron **OR** ondansetron (ZOFRAN) IV, senna-docusate, sodium chloride flush   Vital Signs    Vitals:   01/20/18 1944 01/21/18 0415 01/21/18 0725 01/21/18 0831  BP: 139/79 (!) 143/89  109/80  Pulse: (!) 110 (!) 107  (!) 102  Resp: 17 18    Temp: 97.7 F (36.5 C) (!) 97.4 F (36.3 C)    TempSrc: Oral Oral    SpO2: 100% 99% 97%   Weight:  159 lb 13.3 oz (72.5 kg)    Height:        Intake/Output Summary (Last 24 hours) at 01/21/2018 0922 Last data filed at 01/21/2018 0848 Gross per 24 hour  Intake 480 ml  Output 1800 ml  Net -1320 ml   Filed Weights   01/19/18 0507 01/20/18 0646 01/21/18 0415  Weight: 162 lb 4.1 oz (73.6 kg) 157 lb 4.8 oz (71.4 kg) 159 lb 13.3 oz (72.5 kg)    Telemetry    Sinus tachycardia -  Personally Reviewed  ECG    none - Personally Reviewed  Physical Exam   GEN: No acute distress.   Neck: 6 cm JVD Cardiac: RRR, no murmurs, rubs, or gallops.  Respiratory: Clear to auscultation bilaterally. GI: Soft, nontender, non-distended  MS: No edema; No deformity. Neuro:  Nonfocal  Psych: Normal affect   Labs    Chemistry Recent Labs  Lab 01/16/18 0733  01/19/18 0505 01/20/18 0419 01/21/18 0515  NA 139   < > 141 141 139  K 4.1   < > 4.3 4.0 4.3  CL 103   < > 107 105 103  CO2 26   < > 26 28 27   GLUCOSE 129*   < > 139* 178* 191*  BUN 66*   < > 66* 57* 71*  CREATININE 2.11*   < > 2.04* 1.82* 1.73*  CALCIUM 8.4*   < > 8.5* 8.8* 8.6*  PROT 6.8  --   --   --   --   ALBUMIN 3.6  --   --   --   --   AST 19  --   --   --   --   ALT 7*  --   --   --   --   ALKPHOS 47  --   --   --   --  BILITOT 0.7  --   --   --   --   GFRNONAA 22*   < > 23* 26* 28*  GFRAA 25*   < > 26* 30* 32*  ANIONGAP 10   < > 8 8 9    < > = values in this interval not displayed.     Hematology Recent Labs  Lab 01/16/18 0733 01/18/18 0751 01/19/18 0505  WBC 13.2* 20.0* 13.7*  RBC 3.42* 3.73* 3.26*  HGB 10.5* 11.3* 9.8*  HCT 30.4* 33.9* 29.6*  MCV 88.9 90.8 90.8  MCH 30.5 30.3 30.1  MCHC 34.3 33.3 33.1  RDW 15.4* 16.0* 15.9*  PLT 279 353 250    Cardiac Enzymes Recent Labs  Lab 01/16/18 0733 01/18/18 0751 01/19/18 0801 01/19/18 1053  TROPONINI 0.05* 0.04* 0.28* 0.26*   No results for input(s): TROPIPOC in the last 168 hours.   BNP Recent Labs  Lab 01/16/18 0734 01/18/18 1449  BNP 319.0* 813.0*     DDimer No results for input(s): DDIMER in the last 168 hours.   Radiology    Dg Chest 2 View  Result Date: 01/20/2018 CLINICAL DATA:  Shortness of breath EXAM: CHEST - 2 VIEW COMPARISON:  January 18, 2018 FINDINGS: There is a small left pleural effusion with mild left base atelectasis. There is no edema or consolidation. There is cardiomegaly with mild pulmonary venous  hypertension. No adenopathy. No evident bone lesions. There is mild degenerative change in the thoracic spine. IMPRESSION: Pulmonary vascular congestion. No frank edema or consolidation. There is a small left pleural effusion with left base atelectasis. Electronically Signed   By: Lowella Grip III M.D.   On: 01/20/2018 08:49    Cardiac Studies   Echo Sept 2018  LVEF 35 to 40%  Mild AI.  Mod MR.    Patient Profile     77 y.o. female with history of nonobstructive CAD by [facility] 02/2017, chronic combined CHF, PAH, COPD, HTN, asthma, DM2, and HLDwhowas admitted with acute on chronic respiratory failure that was felt to be multifactorial as below.     Assessment & Plan    1  Resp failure - she feels better and is close to her baseline  2  Acute on chronic systolic / diastolic CHF and PAH - her weight is up two lbs but she has not had a bm. Continue IV diuresis  3  Nonobstructive CAD    Minimal elev of trop consistent with demand ischemia   No additional workup 4   PVCs -asymptomatic.   5  CKD, stage 3 - her renal function continues to improve. We will follow.  For questions or updates, please contact [place] Please consult [email or web address] for contact info under Cardiology/STEMI.      Signed, Cristopher Peru, MD  01/21/2018, 9:22 AM

## 2018-01-22 LAB — BASIC METABOLIC PANEL
ANION GAP: 8 (ref 5–15)
BUN: 78 mg/dL — ABNORMAL HIGH (ref 6–20)
CALCIUM: 8.9 mg/dL (ref 8.9–10.3)
CHLORIDE: 103 mmol/L (ref 101–111)
CO2: 29 mmol/L (ref 22–32)
Creatinine, Ser: 2.08 mg/dL — ABNORMAL HIGH (ref 0.44–1.00)
GFR calc non Af Amer: 22 mL/min — ABNORMAL LOW (ref >60)
GFR, EST AFRICAN AMERICAN: 26 mL/min — AB (ref >60)
Glucose, Bld: 181 mg/dL — ABNORMAL HIGH (ref 65–99)
Potassium: 4.2 mmol/L (ref 3.5–5.1)
Sodium: 140 mmol/L (ref 135–145)

## 2018-01-22 LAB — GLUCOSE, CAPILLARY: Glucose-Capillary: 144 mg/dL — ABNORMAL HIGH (ref 65–99)

## 2018-01-22 NOTE — Clinical Social Work Placement (Signed)
   CLINICAL SOCIAL WORK PLACEMENT  NOTE  Date:  01/22/2018  Patient Details  Name: Angela Pham MRN: [id number] Date of Birth: January 23, 1941  Clinical Social Work is seeking post-discharge placement for this patient at the [place] level of care (*CSW will initial, date and re-position this form in  chart as items are completed):  Yes   Patient/family provided with [facility] Work Department's list of facilities offering this level of care within the geographic area requested by the patient (or if unable, by the patient's family).  Yes   Patient/family informed of their freedom to choose among providers that offer the needed level of care, that participate in Medicare, Medicaid or managed care program needed by the patient, have an available bed and are willing to accept the patient.  Yes   Patient/family informed of [facility]'s ownership interest in [facility] and [facility], as well as of the fact that they are under no obligation to receive care at these facilities.  PASRR submitted to EDS on 01/20/18     PASRR number received on       Existing PASRR number confirmed on 01/20/18     FL2 transmitted to all facilities in geographic area requested by pt/family on       FL2 transmitted to all facilities within larger geographic area on 01/20/18     Patient informed that his/her managed care company has contracts with or will negotiate with certain facilities, including the following:  ([facility]; [employer])     Yes   Patient/family informed of bed offers received.  Patient chooses bed at [facility]     Physician recommends and patient chooses bed at [facility]    Patient to be transferred to [facility] on 01/22/18.  Patient to be transferred to facility by [facility]     Patient family notified on 01/22/18 of transfer.  Name of family member notified:  None/patient has no  family contacts     PHYSICIAN Please sign DNR     Additional Comment:    _______________________________________________ Zettie Pho, LCSW 01/22/2018, 10:31 AM

## 2018-01-22 NOTE — Clinical Social Work Note (Signed)
The patient will discharge today to [facility]. Should [facility] be unable to transport, the medical necessity form is included in the discharge packet for EMS. The facility and patient are aware and in agreement with this plan. The CSW has delivered the discharge packet including the DNR. The CSW is signing off. Please consult should additional needs arise.  Santiago Bumpers, MSW, Latanya Presser [phone]

## 2018-01-22 NOTE — Progress Notes (Signed)
Progress Note  Patient Name: Angela Pham Date of Encounter: 01/22/2018  Primary Cardiologist: Kathlyn Sacramento, MD   Subjective   "I am going to rehab", denies chest pain or sob.   Inpatient Medications    Scheduled Meds: . arformoterol  15 mcg Nebulization BID  . aspirin EC  81 mg Oral Daily  . atorvastatin  40 mg Oral Daily  . budesonide  0.5 mg Nebulization BID  . carvedilol  6.25 mg Oral BID WC  . furosemide  40 mg Intravenous [id number]  . heparin  5,000 Units Subcutaneous Q8H  . insulin aspart  0-5 Units Subcutaneous QHS  . insulin aspart  0-9 Units Subcutaneous TID WC  . loratadine  10 mg Oral Daily  . methylPREDNISolone (SOLU-MEDROL) injection  40 mg Intravenous Q12H  . mirtazapine  45 mg Oral QHS  . montelukast  10 mg Oral QHS  . multivitamin-lutein  1 capsule Oral Daily  . senna-docusate  2 tablet Oral BID  . sodium chloride flush  3 mL Intravenous Q12H  . spironolactone  12.5 mg Oral Daily  . tiotropium  18 mcg Inhalation Daily   Continuous Infusions: . sodium chloride     PRN Meds: sodium chloride, acetaminophen **OR** acetaminophen, albuterol, ALPRAZolam, bisacodyl, guaiFENesin-dextromethorphan, HYDROcodone-acetaminophen, magnesium hydroxide, [DISCONTINUED] ondansetron **OR** ondansetron (ZOFRAN) IV, senna-docusate, sodium chloride flush   Vital Signs    Vitals:   01/21/18 1955 01/22/18 0423 01/22/18 0500 01/22/18 0842  BP:  117/76  (!) 152/80  Pulse:  93  (!) 118  Resp:  18    Temp:  (!) 97.5 F (36.4 C)    TempSrc:  Oral    SpO2: 94% 100%    Weight:   156 lb 9.6 oz (71 kg)   Height:        Intake/Output Summary (Last 24 hours) at 01/22/2018 0959 Last data filed at 01/21/2018 1711 Gross per 24 hour  Intake 240 ml  Output 500 ml  Net -260 ml   Filed Weights   01/20/18 0646 01/21/18 0415 01/22/18 0500  Weight: 157 lb 4.8 oz (71.4 kg) 159 lb 13.3 oz (72.5 kg) 156 lb 9.6 oz (71 kg)    Physical Exam   GEN: No acute distress.   Neck: 6 cm  JVD Cardiac: RRR, no murmurs, rubs, or gallops.  Respiratory: Clear to auscultation bilaterally, soft wheeze. GI: Soft, nontender, non-distended  MS: No edema; No deformity. Neuro:  Nonfocal  Psych: Normal affect   Labs    Chemistry Recent Labs  Lab 01/16/18 0733  01/20/18 0419 01/21/18 0515 01/22/18 0454  NA 139   < > 141 139 140  K 4.1   < > 4.0 4.3 4.2  CL 103   < > 105 103 103  CO2 26   < > 28 27 29   GLUCOSE 129*   < > 178* 191* 181*  BUN 66*   < > 57* 71* 78*  CREATININE 2.11*   < > 1.82* 1.73* 2.08*  CALCIUM 8.4*   < > 8.8* 8.6* 8.9  PROT 6.8  --   --   --   --   ALBUMIN 3.6  --   --   --   --   AST 19  --   --   --   --   ALT 7*  --   --   --   --   ALKPHOS 47  --   --   --   --   BILITOT 0.7  --   --   --   --  GFRNONAA 22*   < > 26* 28* 22*  GFRAA 25*   < > 30* 32* 26*  ANIONGAP 10   < > 8 9 8    < > = values in this interval not displayed.     Hematology Recent Labs  Lab 01/16/18 0733 01/18/18 0751 01/19/18 0505  WBC 13.2* 20.0* 13.7*  RBC 3.42* 3.73* 3.26*  HGB 10.5* 11.3* 9.8*  HCT 30.4* 33.9* 29.6*  MCV 88.9 90.8 90.8  MCH 30.5 30.3 30.1  MCHC 34.3 33.3 33.1  RDW 15.4* 16.0* 15.9*  PLT 279 353 250    Cardiac Enzymes Recent Labs  Lab 01/16/18 0733 01/18/18 0751 01/19/18 0801 01/19/18 1053  TROPONINI 0.05* 0.04* 0.28* 0.26*   No results for input(s): TROPIPOC in the last 168 hours.   BNP Recent Labs  Lab 01/16/18 0734 01/18/18 1449  BNP 319.0* 813.0*     DDimer No results for input(s): DDIMER in the last 168 hours.   Radiology    No results found.  Cardiac Studies   Echo Sept 2018  LVEF 35 to 40%  Mild AI.  Mod MR.    Patient Profile     77 y.o. female with history of nonobstructive CAD by [facility] 02/2017, chronic combined CHF, PAH, COPD, HTN, asthma, DM2, and HLDwhowas admitted with acute on chronic respiratory failure that was felt to be multifactorial as below.     Assessment & Plan    1  Resp failure - she appears  euvolemic today. She is stable for DC to rehab  2  Acute on chronic systolic / diastolic CHF and PAH - see above. Creatinine up a bit and weight is down. She is probably euvolemic  3  Nonobstructive CAD    Minimal elev of trop consistent with demand ischemia   No additional workup 4   PVCs -asymptomatic.   5  CKD, stage 3 -.consider outpatient nephrology followup.  For questions or updates, please contact [place] Please consult [email or web address] for contact info under Cardiology/STEMI.      Signed, Cristopher [place], MD  01/22/2018, 9:59 AM

## 2018-01-22 NOTE — Plan of Care (Signed)
Pt d/ced to [facility].  Called report to Marlaine Hind, Admission Coord.  Pt will transport via [facility].  Had to get her there before noon.  Pt is A&O, ambulatory.  Wears CPAP at night and is back to using O2 as needed.  IV was removed when she was cleaning up.  She will d/c on prednisone.  No c/o of pain.  Still sounds diminished bilaterally.  Pt's weight is back to baseline after being on lasix.

## 2018-01-22 NOTE — Discharge Summary (Signed)
[facility] [place] NAME: Angela Pham    MR#:  [id number]  DATE OF BIRTH:  08-28-1941  DATE OF ADMISSION:  01/18/2018 ADMITTING PHYSICIAN: Demetrios Loll, MD  DATE OF DISCHARGE: No discharge date for patient encounter.  PRIMARY CARE PHYSICIAN: Gareth Morgan, MD    ADMISSION DIAGNOSIS:  Respiratory distress [R06.03] Acute on chronic respiratory failure with hypercapnia (HCC) [J96.22] Stage 4 chronic kidney disease ([place]) [N18.4]  DISCHARGE DIAGNOSIS:  Active Problems:   Acute respiratory failure with hypoxia ([place])   SECONDARY DIAGNOSIS:   Past Medical History:  Diagnosis Date  . Asthma   . Chronic combined systolic and diastolic CHF (congestive heart failure) ([place])    a. TTE 02/2017: EF 45-50%, basal amd midanteroseptal HK b. 05/2017: echo showing EF of 35-40%, mild AI, moderate MR, and mildly dilated LA.   Marland Kitchen Coronary artery disease, non-occlusive    a. LHC 02/2017 normal coronary arteries  . Hyperlipidemia   . Hypertension   . Morbid obesity ([place])   . OSA (obstructive sleep apnea)    a. On CPAP w/ O2 bleed in.  . Pulmonary hypertension ([place])   . Type II diabetes mellitus ([place])     HOSPITAL COURSE:  Acute on chronic respiratory failure with hypoxia due to acute on chronic diastolic CHFandCOPD exacerbation. On O2 [place] 3 L, off BiPAP.   Acute on chronic diastolic CHF Resolved Treated on our congestive heart failure protocol, provided IV Lasix while in house, cardiology to see patient while in-house, restarted on torsemide at discharge, increase spironolactone, placed on low-sodium fluid restricted diet, and to follow-up with cardiology/congestive heart failure clinic status post discharge for continued care  COPD exacerbation Resolved Successfully weaned off oxygen during the day-is on oxygen 2 L at night with CPAP, currently off oxygen She did not house with IV Solu-Medrol which was subsequently discontinued, will be  discharged on p.o. prednisone, breathing treatments, and to follow-up with pulmonology status post discharge for reevaluation  Elevated troponin Secondary to demand ischemia due to above Continue aspirin.  Leukocytosis Resolved Secondary to above  Pulmonary hypertension Stable Follow-up with pulmonology/cardiology status post discharge for continued care  CKD stage IV Stable on current regiment  Diabetes Controlled on current regiment  DISCHARGE CONDITIONS:  On the day of discharge patient is afebrile, hemodynamic is stable, weaned off oxygen-though patient is on 2 L at night, follow-up with primary care provider and cardiology as directed, for more specific details please see chart, resume home oxygen as well as CPAP, for SNF status post discharge CONSULTS OBTAINED:  Treatment Team:  Minna Merritts, MD Wilhelmina Mcardle, MD  DRUG ALLERGIES:   Allergies  Allergen Reactions  . Ace Inhibitors Anaphylaxis    Tongue swelling  . Shellfish Allergy Hives and Swelling    Swelling around face and mouth     DISCHARGE MEDICATIONS:   Allergies as of 01/22/2018      Reactions   Ace Inhibitors Anaphylaxis   Tongue swelling   Shellfish Allergy Hives, Swelling   Swelling around face and mouth      Medication List    TAKE these medications   arformoterol 15 MCG/2ML Nebu Commonly known as:  BROVANA Take 2 mLs (15 mcg total) 2 (two) times daily by nebulization. DX: COPD J44.9   aspirin 81 MG EC tablet Take 1 tablet (81 mg total) by mouth daily.   atorvastatin 40 MG tablet Commonly known as:  LIPITOR Take 40 mg by  mouth daily.   budesonide 0.25 MG/2ML nebulizer solution Commonly known as:  PULMICORT Take 2 mLs (0.25 mg total) by nebulization 2 (two) times daily. DX: COPD J44.9   carvedilol 6.25 MG tablet Commonly known as:  COREG Take 1 tablet (6.25 mg total) by mouth daily. What changed:  when to take this   EYE VITAMINS Caps Take 1 tablet by mouth 2 (two)  times daily.   guaiFENesin-dextromethorphan 100-10 MG/5ML syrup Commonly known as:  ROBITUSSIN DM Take 5 mLs by mouth every 4 (four) hours as needed for cough.   levocetirizine 5 MG tablet Commonly known as:  XYZAL Take 5 mg by mouth daily.   metFORMIN 1000 MG tablet Commonly known as:  GLUCOPHAGE Take 1 tablet by mouth 2 (two) times daily.   mirtazapine 45 MG tablet Commonly known as:  REMERON Take 1 tablet by mouth at bedtime.   montelukast 10 MG tablet Commonly known as:  SINGULAIR Take 10 mg by mouth at bedtime.   potassium chloride SA 20 MEQ tablet Commonly known as:  K-DUR,KLOR-CON Take 1 tablet (20 mEq total) by mouth daily.   predniSONE 50 MG tablet Commonly known as:  DELTASONE 1 po daily   salmeterol 50 MCG/DOSE diskus inhaler Commonly known as:  SEREVENT DISKUS Inhale 1 puff into the lungs 2 (two) times daily.   senna-docusate 8.6-50 MG tablet Commonly known as:  Senokot-S Take 2 tablets by mouth 2 (two) times daily.   spironolactone 25 MG tablet Commonly known as:  ALDACTONE Take 1 tablet (25 mg total) by mouth daily. What changed:  how much to take   tiotropium 18 MCG inhalation capsule Commonly known as:  SPIRIVA HANDIHALER Place 1 capsule (18 mcg total) into inhaler and inhale daily.   torsemide 20 MG tablet Commonly known as:  DEMADEX Take 1 tablet (20 mg total) by mouth 2 (two) times daily.        DISCHARGE INSTRUCTIONS:   If you experience worsening of your admission symptoms, develop shortness of breath, life threatening emergency, suicidal or homicidal thoughts you must seek medical attention immediately by calling 911 or calling your MD immediately  if symptoms less severe.  You Must read complete instructions/literature along with all the possible adverse reactions/side effects for all the Medicines you take and that have been prescribed to you. Take any new Medicines after you have completely understood and accept all the possible  adverse reactions/side effects.   Please note  You were cared for by a hospitalist during your hospital stay. If you have any questions about your discharge medications or the care you received while you were in the hospital after you are discharged, you can call the unit and asked to speak with the hospitalist on call if the hospitalist that took care of you is not available. Once you are discharged, your primary care physician will handle any further medical issues. Please note that NO REFILLS for any discharge medications will be authorized once you are discharged, as it is imperative that you return to your primary care physician (or establish a relationship with a primary care physician if you do not have one) for your aftercare needs so that they can reassess your need for medications and monitor your lab values.    Today   CHIEF COMPLAINT:   Chief Complaint  Patient presents with  . Respiratory Distress    HISTORY OF PRESENT ILLNESS:  77 y.o.femalewith a known history of multiple medical problems as below and the recurrent admission. The  patient presented to the ED base above chief complaints. She has had thesesymptoms for several days, which has been worsening today. She was seen in ED 2 days ago for shortness of breath and the treated for COPD exacerbation. She use home nebulizer without any improvement. She was foundin the respiratory distress, wheezing and hypoxia at the 80%the room air and put on CPAP. She is treated with nebulizer and put on BiPAP in ED.chest x-ray showed pulmonary edema. VITAL SIGNS:  Blood pressure (!) 152/80, pulse (!) 118, temperature (!) 97.5 F (36.4 C), temperature source Oral, resp. rate 18, height 4\' 11"  (1.499 m), weight 71 kg (156 lb 9.6 oz), SpO2 100 %.  I/O:    Intake/Output Summary (Last 24 hours) at 01/22/2018 1032 Last data filed at 01/21/2018 1711 Gross per 24 hour  Intake 240 ml  Output 500 ml  Net -260 ml    PHYSICAL  EXAMINATION:  GENERAL:  77 y.o.-year-old patient lying in the bed with no acute distress.  EYES: Pupils equal, round, reactive to light and accommodation. No scleral icterus. Extraocular muscles intact.  HEENT: Head atraumatic, normocephalic. Oropharynx and nasopharynx clear.  NECK:  Supple, no jugular venous distention. No thyroid enlargement, no tenderness.  LUNGS: Normal breath sounds bilaterally, no wheezing, rales,rhonchi or crepitation. No use of accessory muscles of respiration.  CARDIOVASCULAR: S1, S2 normal. No murmurs, rubs, or gallops.  ABDOMEN: Soft, non-tender, non-distended. Bowel sounds present. No organomegaly or mass.  EXTREMITIES: No pedal edema, cyanosis, or clubbing.  NEUROLOGIC: Cranial nerves II through XII are intact. Muscle strength 5/5 in all extremities. Sensation intact. Gait not checked.  PSYCHIATRIC: The patient is alert and oriented x 3.  SKIN: No obvious rash, lesion, or ulcer.   DATA REVIEW:   CBC Recent Labs  Lab 01/19/18 0505  WBC 13.7*  HGB 9.8*  HCT 29.6*  PLT 250    Chemistries  Recent Labs  Lab 01/16/18 0733  01/19/18 1642  01/22/18 0454  NA 139   < >  --    < > 140  K 4.1   < >  --    < > 4.2  CL 103   < >  --    < > 103  CO2 26   < >  --    < > 29  GLUCOSE 129*   < >  --    < > 181*  BUN 66*   < >  --    < > 78*  CREATININE 2.11*   < >  --    < > 2.08*  CALCIUM 8.4*   < >  --    < > 8.9  MG  --    < > 1.8  --   --   AST 19  --   --   --   --   ALT 7*  --   --   --   --   ALKPHOS 47  --   --   --   --   BILITOT 0.7  --   --   --   --    < > = values in this interval not displayed.    Cardiac Enzymes Recent Labs  Lab 01/19/18 1053  TROPONINI 0.26*    Microbiology Results  Results for orders placed or performed during the hospital encounter of 01/18/18  MRSA PCR Screening     Status: None   Collection Time: 01/18/18  1:07 PM  Result Value Ref Range Status  MRSA by PCR NEGATIVE NEGATIVE Final    Comment:        The  GeneXpert MRSA Assay (FDA approved for NASAL specimens only), is one component of a comprehensive MRSA colonization surveillance program. It is not intended to diagnose MRSA infection nor to guide or monitor treatment for MRSA infections. Performed at [facility], [address], [place], [place] [address]     RADIOLOGY:  No results found.  EKG:   Orders placed or performed during the hospital encounter of 01/18/18  . EKG 12-Lead  . EKG 12-Lead      Management plans discussed with the patient, family and they are in agreement.  CODE STATUS:     Code Status Orders  (From admission, onward)        Start     Ordered   01/18/18 1207  Do not attempt resuscitation (DNR)  Continuous    Question Answer Comment  In the event of cardiac or respiratory ARREST Do not call a "code blue"   In the event of cardiac or respiratory ARREST Do not perform Intubation, CPR, defibrillation or ACLS   In the event of cardiac or respiratory ARREST Use medication by any route, position, wound care, and other measures to relive pain and suffering. May use oxygen, suction and manual treatment of airway obstruction as needed for comfort.   Comments RN may pronounce      01/18/18 1206    Code Status History    Date Active Date Inactive Code Status Order ID Comments User Context   11/11/2017 0613 11/12/2017 1647 DNR [id number]  Saundra Shelling, MD Inpatient   08/24/2017 1245 08/26/2017 1907 DNR [id number]  Nicholes Mango, MD ED   08/20/2017 1249 08/23/2017 2142 DNR [id number]  Idelle Crouch, MD Inpatient   08/03/2017 1409 08/06/2017 1912 Full Code [id number]  Henreitta Leber, MD ED   06/27/2017 0429 07/04/2017 1659 DNR [id number]  Saundra Shelling, MD Inpatient   06/13/2017 0425 06/16/2017 1816 DNR [id number]  Mikael Spray, NP Inpatient   06/12/2017 0328 06/13/2017 0425 Full Code [id number]  [place], [place], DO Inpatient   06/12/2017 0200 06/12/2017 0328 DNR [id number]  Harvie Bridge, DO  ED   05/29/2017 0249 06/02/2017 2123 DNR [id number]  Verlin Dike, RN Inpatient   05/28/2017 2259 05/29/2017 0249 Full Code [id number]  [place], Mallory, DO Inpatient   05/04/2017 1719 05/08/2017 1718 DNR [id number]  Loletha Grayer, MD ED   03/09/2017 1234 03/11/2017 2208 Full Code [id number]  Minna Merritts, MD Inpatient   03/07/2017 0512 03/09/2017 1234 Full Code [id number]  Saundra Shelling, MD Inpatient    Advance Directive Documentation     Most Recent Value  Type of Advance Directive  Living will  Pre-existing out of facility DNR order (yellow form or pink MOST form)  -  "MOST" Form in Place?  -      TOTAL TIME TAKING CARE OF THIS PATIENT: 35 minutes.    Avel Peace Salary M.D on 01/22/2018 at 10:32 AM  Between 7am to 6pm - Pager - [phone]  After 6pm go to [email or web address] - password [email or web address] [place] [facility]  Office  [phone]  CC: Primary care physician; Gareth Morgan, MD   Note: This dictation was prepared with Dragon dictation along with smaller phrase technology. Any transcriptional errors that result from this process are unintentional.

## 2018-02-08 ENCOUNTER — Inpatient Hospital Stay (HOSPITAL_COMMUNITY)
Admit: 2018-02-08 | Discharge: 2018-02-08 | Disposition: A | Discharge status: Home / Self Care | Payer: Medicare (Managed Care) | Attending: Pulmonary Disease | Admitting: Pulmonary Disease

## 2018-02-08 ENCOUNTER — Other Ambulatory Visit: Payer: Self-pay

## 2018-02-08 ENCOUNTER — Inpatient Hospital Stay
Admission: EM | Admit: 2018-02-08 | Discharge: 2018-02-11 | DRG: 208 | Disposition: A | Discharge status: Home / Self Care | Payer: Medicare (Managed Care) | Attending: Internal Medicine | Admitting: Internal Medicine

## 2018-02-08 ENCOUNTER — Inpatient Hospital Stay: Payer: Medicare (Managed Care)

## 2018-02-08 ENCOUNTER — Emergency Department: Payer: Medicare (Managed Care)

## 2018-02-08 DIAGNOSIS — I2510 Atherosclerotic heart disease of native coronary artery without angina pectoris: Secondary | ICD-10-CM | POA: Diagnosis present

## 2018-02-08 DIAGNOSIS — Z7984 Long term (current) use of oral hypoglycemic drugs: Secondary | ICD-10-CM

## 2018-02-08 DIAGNOSIS — Z7189 Other specified counseling: Secondary | ICD-10-CM | POA: Diagnosis not present

## 2018-02-08 DIAGNOSIS — Z7951 Long term (current) use of inhaled steroids: Secondary | ICD-10-CM | POA: Diagnosis not present

## 2018-02-08 DIAGNOSIS — I420 Dilated cardiomyopathy: Secondary | ICD-10-CM | POA: Diagnosis not present

## 2018-02-08 DIAGNOSIS — I272 Pulmonary hypertension, unspecified: Secondary | ICD-10-CM | POA: Diagnosis present

## 2018-02-08 DIAGNOSIS — Z87891 Personal history of nicotine dependence: Secondary | ICD-10-CM

## 2018-02-08 DIAGNOSIS — J181 Lobar pneumonia, unspecified organism: Secondary | ICD-10-CM | POA: Diagnosis present

## 2018-02-08 DIAGNOSIS — Z7982 Long term (current) use of aspirin: Secondary | ICD-10-CM

## 2018-02-08 DIAGNOSIS — G4733 Obstructive sleep apnea (adult) (pediatric): Secondary | ICD-10-CM | POA: Diagnosis present

## 2018-02-08 DIAGNOSIS — Z9981 Dependence on supplemental oxygen: Secondary | ICD-10-CM

## 2018-02-08 DIAGNOSIS — E1122 Type 2 diabetes mellitus with diabetic chronic kidney disease: Secondary | ICD-10-CM | POA: Diagnosis present

## 2018-02-08 DIAGNOSIS — Z91013 Allergy to seafood: Secondary | ICD-10-CM | POA: Diagnosis not present

## 2018-02-08 DIAGNOSIS — I351 Nonrheumatic aortic (valve) insufficiency: Secondary | ICD-10-CM | POA: Diagnosis not present

## 2018-02-08 DIAGNOSIS — E1165 Type 2 diabetes mellitus with hyperglycemia: Secondary | ICD-10-CM | POA: Diagnosis present

## 2018-02-08 DIAGNOSIS — Z66 Do not resuscitate: Secondary | ICD-10-CM | POA: Diagnosis present

## 2018-02-08 DIAGNOSIS — J9601 Acute respiratory failure with hypoxia: Secondary | ICD-10-CM

## 2018-02-08 DIAGNOSIS — J9621 Acute and chronic respiratory failure with hypoxia: Secondary | ICD-10-CM | POA: Diagnosis present

## 2018-02-08 DIAGNOSIS — J9690 Respiratory failure, unspecified, unspecified whether with hypoxia or hypercapnia: Secondary | ICD-10-CM | POA: Diagnosis present

## 2018-02-08 DIAGNOSIS — I248 Other forms of acute ischemic heart disease: Secondary | ICD-10-CM | POA: Diagnosis present

## 2018-02-08 DIAGNOSIS — E872 Acidosis: Secondary | ICD-10-CM | POA: Diagnosis present

## 2018-02-08 DIAGNOSIS — Z515 Encounter for palliative care: Secondary | ICD-10-CM | POA: Diagnosis not present

## 2018-02-08 DIAGNOSIS — J449 Chronic obstructive pulmonary disease, unspecified: Secondary | ICD-10-CM | POA: Diagnosis not present

## 2018-02-08 DIAGNOSIS — J440 Chronic obstructive pulmonary disease with acute lower respiratory infection: Secondary | ICD-10-CM | POA: Diagnosis present

## 2018-02-08 DIAGNOSIS — N184 Chronic kidney disease, stage 4 (severe): Secondary | ICD-10-CM | POA: Diagnosis present

## 2018-02-08 DIAGNOSIS — N179 Acute kidney failure, unspecified: Secondary | ICD-10-CM | POA: Diagnosis present

## 2018-02-08 DIAGNOSIS — I5022 Chronic systolic (congestive) heart failure: Secondary | ICD-10-CM | POA: Diagnosis not present

## 2018-02-08 DIAGNOSIS — J9622 Acute and chronic respiratory failure with hypercapnia: Secondary | ICD-10-CM | POA: Diagnosis present

## 2018-02-08 DIAGNOSIS — I5032 Chronic diastolic (congestive) heart failure: Secondary | ICD-10-CM

## 2018-02-08 DIAGNOSIS — J9602 Acute respiratory failure with hypercapnia: Secondary | ICD-10-CM | POA: Diagnosis not present

## 2018-02-08 DIAGNOSIS — J189 Pneumonia, unspecified organism: Secondary | ICD-10-CM

## 2018-02-08 DIAGNOSIS — Z79899 Other long term (current) drug therapy: Secondary | ICD-10-CM

## 2018-02-08 DIAGNOSIS — D649 Anemia, unspecified: Secondary | ICD-10-CM | POA: Diagnosis present

## 2018-02-08 DIAGNOSIS — J441 Chronic obstructive pulmonary disease with (acute) exacerbation: Secondary | ICD-10-CM | POA: Diagnosis not present

## 2018-02-08 DIAGNOSIS — R0602 Shortness of breath: Secondary | ICD-10-CM | POA: Diagnosis present

## 2018-02-08 DIAGNOSIS — Z9911 Dependence on respirator [ventilator] status: Secondary | ICD-10-CM | POA: Diagnosis not present

## 2018-02-08 DIAGNOSIS — E785 Hyperlipidemia, unspecified: Secondary | ICD-10-CM | POA: Diagnosis present

## 2018-02-08 DIAGNOSIS — I5042 Chronic combined systolic (congestive) and diastolic (congestive) heart failure: Secondary | ICD-10-CM | POA: Diagnosis present

## 2018-02-08 DIAGNOSIS — I429 Cardiomyopathy, unspecified: Secondary | ICD-10-CM | POA: Diagnosis present

## 2018-02-08 DIAGNOSIS — I130 Hypertensive heart and chronic kidney disease with heart failure and stage 1 through stage 4 chronic kidney disease, or unspecified chronic kidney disease: Secondary | ICD-10-CM | POA: Diagnosis present

## 2018-02-08 DIAGNOSIS — J45901 Unspecified asthma with (acute) exacerbation: Secondary | ICD-10-CM | POA: Diagnosis present

## 2018-02-08 DIAGNOSIS — R531 Weakness: Secondary | ICD-10-CM | POA: Diagnosis not present

## 2018-02-08 DIAGNOSIS — Z4659 Encounter for fitting and adjustment of other gastrointestinal appliance and device: Secondary | ICD-10-CM

## 2018-02-08 LAB — CBC WITH DIFFERENTIAL/PLATELET
BASOS ABS: 0.1 10*3/uL (ref 0–0.1)
Basophils Relative: 1 %
EOS ABS: 0.5 10*3/uL (ref 0–0.7)
EOS PCT: 3 %
HCT: 34.1 % — ABNORMAL LOW (ref 35.0–47.0)
Hemoglobin: 10.9 g/dL — ABNORMAL LOW (ref 12.0–16.0)
Lymphocytes Relative: 28 %
Lymphs Abs: 4.8 10*3/uL — ABNORMAL HIGH (ref 1.0–3.6)
MCH: 30.1 pg (ref 26.0–34.0)
MCHC: 32.1 g/dL (ref 32.0–36.0)
MCV: 93.6 fL (ref 80.0–100.0)
Monocytes Absolute: 0.8 10*3/uL (ref 0.2–0.9)
Monocytes Relative: 5 %
Neutro Abs: 11.1 10*3/uL — ABNORMAL HIGH (ref 1.4–6.5)
Neutrophils Relative %: 63 %
PLATELETS: 309 10*3/uL (ref 150–440)
RBC: 3.64 MIL/uL — AB (ref 3.80–5.20)
RDW: 16.6 % — ABNORMAL HIGH (ref 11.5–14.5)
WBC: 17.4 10*3/uL — AB (ref 3.6–11.0)

## 2018-02-08 LAB — BRAIN NATRIURETIC PEPTIDE: B Natriuretic Peptide: 512 pg/mL — ABNORMAL HIGH (ref 0.0–100.0)

## 2018-02-08 LAB — BLOOD GAS, VENOUS
Acid-base deficit: 6.2 mmol/L — ABNORMAL HIGH (ref 0.0–2.0)
Bicarbonate: 26.4 mmol/L (ref 20.0–28.0)
O2 SAT: 46.6 %
PATIENT TEMPERATURE: 37
PCO2 VEN: 100 mmHg — AB (ref 44.0–60.0)
PO2 VEN: 39 mmHg (ref 32.0–45.0)
pH, Ven: 7.03 — CL (ref 7.250–7.430)

## 2018-02-08 LAB — BASIC METABOLIC PANEL
Anion gap: 14 (ref 5–15)
BUN: 66 mg/dL — ABNORMAL HIGH (ref 6–20)
CHLORIDE: 104 mmol/L (ref 101–111)
CO2: 22 mmol/L (ref 22–32)
CREATININE: 2.03 mg/dL — AB (ref 0.44–1.00)
Calcium: 8.6 mg/dL — ABNORMAL LOW (ref 8.9–10.3)
GFR calc non Af Amer: 23 mL/min — ABNORMAL LOW (ref >60)
GFR, EST AFRICAN AMERICAN: 27 mL/min — AB (ref >60)
Glucose, Bld: 311 mg/dL — ABNORMAL HIGH (ref 65–99)
Potassium: 4.9 mmol/L (ref 3.5–5.1)
SODIUM: 140 mmol/L (ref 135–145)

## 2018-02-08 LAB — STREP PNEUMONIAE URINARY ANTIGEN: Strep Pneumo Urinary Antigen: NEGATIVE

## 2018-02-08 LAB — GLUCOSE, CAPILLARY
GLUCOSE-CAPILLARY: 115 mg/dL — AB (ref 65–99)
Glucose-Capillary: 188 mg/dL — ABNORMAL HIGH (ref 65–99)
Glucose-Capillary: 150 mg/dL — ABNORMAL HIGH (ref 65–99)
Glucose-Capillary: 154 mg/dL — ABNORMAL HIGH (ref 65–99)

## 2018-02-08 LAB — LACTIC ACID, PLASMA: LACTIC ACID, VENOUS: 2.8 mmol/L — AB (ref 0.5–1.9)

## 2018-02-08 LAB — PROCALCITONIN

## 2018-02-08 LAB — TRIGLYCERIDES: Triglycerides: 79 mg/dL (ref <150)

## 2018-02-08 LAB — TROPONIN I
TROPONIN I: 0.59 ng/mL — AB (ref <0.03)
Troponin I: 0.05 ng/mL (ref <0.03)
Troponin I: 1.8 ng/mL (ref <0.03)

## 2018-02-08 LAB — MRSA PCR SCREENING: MRSA by PCR: NEGATIVE

## 2018-02-08 MED ORDER — CETIRIZINE HCL 10 MG PO TABS
5.0000 mg | ORAL_TABLET | Freq: Every day | ORAL | Status: DC
  Filled 2018-02-08 (×2): qty 1

## 2018-02-08 MED ORDER — BUDESONIDE 0.25 MG/2ML IN SUSP
0.2500 mg | Freq: Two times a day (BID) | RESPIRATORY_TRACT | Status: DC
  Administered 2018-02-09: 0.25 mg via RESPIRATORY_TRACT
  Filled 2018-02-08: qty 2

## 2018-02-08 MED ORDER — CARVEDILOL 6.25 MG PO TABS: 6.2500 mg | ORAL_TABLET | Freq: Two times a day (BID) | ORAL | Status: DC

## 2018-02-08 MED ORDER — ATORVASTATIN CALCIUM 20 MG PO TABS: 40.0000 mg | ORAL_TABLET | Freq: Every day | ORAL | Status: DC

## 2018-02-08 MED ORDER — ATORVASTATIN CALCIUM 20 MG PO TABS
40.0000 mg | ORAL_TABLET | Freq: Every day | ORAL | Status: DC
  Administered 2018-02-08: 40 mg
  Filled 2018-02-08: qty 2

## 2018-02-08 MED ORDER — SENNOSIDES-DOCUSATE SODIUM 8.6-50 MG PO TABS
2.0000 | ORAL_TABLET | Freq: Two times a day (BID) | ORAL | Status: DC
  Administered 2018-02-08 – 2018-02-11 (×6): 2 via ORAL
  Filled 2018-02-08 (×6): qty 2

## 2018-02-08 MED ORDER — FAMOTIDINE IN NACL 20-0.9 MG/50ML-% IV SOLN
20.0000 mg | INTRAVENOUS | Status: DC
  Administered 2018-02-08: 20 mg via INTRAVENOUS
  Filled 2018-02-08: qty 50

## 2018-02-08 MED ORDER — DOCUSATE SODIUM 50 MG/5ML PO LIQD: 100.0000 mg | Freq: Two times a day (BID) | ORAL | Status: DC | PRN

## 2018-02-08 MED ORDER — METHYLPREDNISOLONE SODIUM SUCC 40 MG IJ SOLR
40.0000 mg | Freq: Two times a day (BID) | INTRAMUSCULAR | Status: DC
  Administered 2018-02-08: 40 mg via INTRAVENOUS
  Filled 2018-02-08: qty 1

## 2018-02-08 MED ORDER — INSULIN GLARGINE 100 UNIT/ML ~~LOC~~ SOLN
30.0000 [IU] | Freq: Every day | SUBCUTANEOUS | Status: DC
  Administered 2018-02-08: 30 [IU] via SUBCUTANEOUS
  Filled 2018-02-08 (×2): qty 0.3

## 2018-02-08 MED ORDER — BISACODYL 10 MG RE SUPP: 10.0000 mg | Freq: Every day | RECTAL | Status: DC | PRN

## 2018-02-08 MED ORDER — ASPIRIN EC 81 MG PO TBEC: 81.0000 mg | DELAYED_RELEASE_TABLET | Freq: Every day | ORAL | Status: DC

## 2018-02-08 MED ORDER — MONTELUKAST SODIUM 10 MG PO TABS: 10.0000 mg | ORAL_TABLET | Freq: Every day | ORAL | Status: DC

## 2018-02-08 MED ORDER — ACETAMINOPHEN 325 MG PO TABS: 650.0000 mg | ORAL_TABLET | ORAL | Status: DC | PRN

## 2018-02-08 MED ORDER — LACTATED RINGERS IV SOLN: INTRAVENOUS | Status: DC

## 2018-02-08 MED ORDER — SODIUM CHLORIDE 0.9 % IV SOLN: 250.0000 mL | INTRAVENOUS | Status: DC | PRN

## 2018-02-08 MED ORDER — FENTANYL CITRATE (PF) 100 MCG/2ML IJ SOLN
50.0000 ug | INTRAMUSCULAR | Status: DC | PRN
  Filled 2018-02-08: qty 2

## 2018-02-08 MED ORDER — PROPOFOL 1000 MG/100ML IV EMUL
0.0000 ug/kg/min | INTRAVENOUS | Status: DC
  Administered 2018-02-08 (×2): 25 ug/kg/min via INTRAVENOUS
  Administered 2018-02-09: 15 ug/kg/min via INTRAVENOUS
  Filled 2018-02-08 (×2): qty 100

## 2018-02-08 MED ORDER — VANCOMYCIN HCL IN DEXTROSE 1-5 GM/200ML-% IV SOLN
1000.0000 mg | Freq: Once | INTRAVENOUS | Status: AC
  Administered 2018-02-08: 1000 mg via INTRAVENOUS
  Filled 2018-02-08: qty 200

## 2018-02-08 MED ORDER — NITROGLYCERIN IN D5W 200-5 MCG/ML-% IV SOLN
0.0000 ug/min | Freq: Once | INTRAVENOUS | Status: DC
  Filled 2018-02-08: qty 250

## 2018-02-08 MED ORDER — SPIRONOLACTONE 25 MG PO TABS
25.0000 mg | ORAL_TABLET | Freq: Every day | ORAL | Status: DC
  Administered 2018-02-09: 25 mg
  Filled 2018-02-08: qty 1

## 2018-02-08 MED ORDER — METHYLPREDNISOLONE SODIUM SUCC 125 MG IJ SOLR: 60.0000 mg | Freq: Four times a day (QID) | INTRAMUSCULAR | Status: DC

## 2018-02-08 MED ORDER — SODIUM CHLORIDE 0.9 % IV SOLN
1.0000 g | INTRAVENOUS | Status: DC
  Administered 2018-02-08 – 2018-02-10 (×3): 1 g via INTRAVENOUS
  Filled 2018-02-08 (×3): qty 1

## 2018-02-08 MED ORDER — IPRATROPIUM-ALBUTEROL 0.5-2.5 (3) MG/3ML IN SOLN
3.0000 mL | Freq: Four times a day (QID) | RESPIRATORY_TRACT | Status: DC
  Administered 2018-02-08 – 2018-02-10 (×9): 3 mL via RESPIRATORY_TRACT
  Filled 2018-02-08 (×8): qty 3

## 2018-02-08 MED ORDER — SUCCINYLCHOLINE CHLORIDE 20 MG/ML IJ SOLN
INTRAMUSCULAR | Status: AC | PRN
  Administered 2018-02-08: 100 mg via INTRAVENOUS

## 2018-02-08 MED ORDER — LORAZEPAM 2 MG/ML IJ SOLN
INTRAMUSCULAR | Status: AC
  Administered 2018-02-08: 2 mg via INTRAVENOUS
  Filled 2018-02-08: qty 1

## 2018-02-08 MED ORDER — ALBUTEROL SULFATE (2.5 MG/3ML) 0.083% IN NEBU: 2.5000 mg | INHALATION_SOLUTION | RESPIRATORY_TRACT | Status: DC | PRN

## 2018-02-08 MED ORDER — METHYLPREDNISOLONE SODIUM SUCC 40 MG IJ SOLR: 40.0000 mg | Freq: Two times a day (BID) | INTRAMUSCULAR | Status: DC

## 2018-02-08 MED ORDER — CHLORHEXIDINE GLUCONATE 0.12% ORAL RINSE (MEDLINE KIT)
15.0000 mL | Freq: Two times a day (BID) | OROMUCOSAL | Status: DC
  Administered 2018-02-08 – 2018-02-10 (×5): 15 mL via OROMUCOSAL

## 2018-02-08 MED ORDER — INSULIN ASPART 100 UNIT/ML ~~LOC~~ SOLN: 0.0000 [IU] | Freq: Every day | SUBCUTANEOUS | Status: DC

## 2018-02-08 MED ORDER — ONDANSETRON HCL 4 MG/2ML IJ SOLN
4.0000 mg | Freq: Four times a day (QID) | INTRAMUSCULAR | Status: DC | PRN
Start: 2018-02-08 — End: 2018-02-11

## 2018-02-08 MED ORDER — PROPOFOL 1000 MG/100ML IV EMUL
5.0000 ug/kg/min | Freq: Once | INTRAVENOUS | Status: AC
  Administered 2018-02-08: 5 ug/kg/min via INTRAVENOUS
  Filled 2018-02-08: qty 100

## 2018-02-08 MED ORDER — VANCOMYCIN HCL IN DEXTROSE 1-5 GM/200ML-% IV SOLN
1000.0000 mg | INTRAVENOUS | Status: DC
  Filled 2018-02-08: qty 200

## 2018-02-08 MED ORDER — ETOMIDATE 2 MG/ML IV SOLN
INTRAVENOUS | Status: AC | PRN
  Administered 2018-02-08: 20 mg via INTRAVENOUS

## 2018-02-08 MED ORDER — BUDESONIDE 0.25 MG/2ML IN SUSP
0.2500 mg | Freq: Two times a day (BID) | RESPIRATORY_TRACT | Status: DC
  Administered 2018-02-08 (×2): 0.25 mg via RESPIRATORY_TRACT
  Filled 2018-02-08 (×2): qty 2

## 2018-02-08 MED ORDER — FAMOTIDINE IN NACL 20-0.9 MG/50ML-% IV SOLN: 20.0000 mg | Freq: Two times a day (BID) | INTRAVENOUS | Status: DC

## 2018-02-08 MED ORDER — LORAZEPAM 2 MG/ML IJ SOLN
2.0000 mg | Freq: Once | INTRAMUSCULAR | Status: AC
  Administered 2018-02-08: 2 mg via INTRAVENOUS

## 2018-02-08 MED ORDER — TORSEMIDE 20 MG PO TABS: 20.0000 mg | ORAL_TABLET | Freq: Two times a day (BID) | ORAL | Status: DC

## 2018-02-08 MED ORDER — CARVEDILOL 6.25 MG PO TABS
6.2500 mg | ORAL_TABLET | Freq: Two times a day (BID) | ORAL | Status: DC
  Administered 2018-02-08 – 2018-02-09 (×2): 6.25 mg
  Filled 2018-02-08 (×2): qty 1

## 2018-02-08 MED ORDER — PIPERACILLIN-TAZOBACTAM 3.375 G IVPB 30 MIN
3.3750 g | Freq: Once | INTRAVENOUS | Status: AC
  Administered 2018-02-08: 3.375 g via INTRAVENOUS
  Filled 2018-02-08: qty 50

## 2018-02-08 MED ORDER — ASPIRIN 81 MG PO CHEW
81.0000 mg | CHEWABLE_TABLET | Freq: Once | ORAL | Status: AC
  Administered 2018-02-08: 81 mg
  Filled 2018-02-08: qty 1

## 2018-02-08 MED ORDER — SPIRONOLACTONE 25 MG PO TABS: 25.0000 mg | ORAL_TABLET | Freq: Every day | ORAL | Status: DC

## 2018-02-08 MED ORDER — INSULIN ASPART 100 UNIT/ML ~~LOC~~ SOLN
0.0000 [IU] | SUBCUTANEOUS | Status: DC
  Administered 2018-02-08: 2 [IU] via SUBCUTANEOUS
  Administered 2018-02-08: 3 [IU] via SUBCUTANEOUS
  Filled 2018-02-08 (×2): qty 1

## 2018-02-08 MED ORDER — HEPARIN SODIUM (PORCINE) 5000 UNIT/ML IJ SOLN
5000.0000 [IU] | Freq: Three times a day (TID) | INTRAMUSCULAR | Status: DC
  Administered 2018-02-08 – 2018-02-09 (×2): 5000 [IU] via SUBCUTANEOUS
  Filled 2018-02-08 (×2): qty 1

## 2018-02-08 MED ORDER — MONTELUKAST SODIUM 10 MG PO TABS
10.0000 mg | ORAL_TABLET | Freq: Every day | ORAL | Status: DC
  Administered 2018-02-08: 10 mg
  Filled 2018-02-08: qty 1

## 2018-02-08 MED ORDER — ORAL CARE MOUTH RINSE
15.0000 mL | OROMUCOSAL | Status: DC
  Administered 2018-02-08 – 2018-02-09 (×7): 15 mL via OROMUCOSAL

## 2018-02-08 MED ORDER — TORSEMIDE 20 MG PO TABS
20.0000 mg | ORAL_TABLET | Freq: Two times a day (BID) | ORAL | Status: DC
  Administered 2018-02-08 – 2018-02-09 (×2): 20 mg
  Filled 2018-02-08 (×2): qty 1

## 2018-02-08 MED ORDER — INSULIN ASPART 100 UNIT/ML ~~LOC~~ SOLN: 0.0000 [IU] | Freq: Three times a day (TID) | SUBCUTANEOUS | Status: DC

## 2018-02-08 MED ORDER — FENTANYL CITRATE (PF) 100 MCG/2ML IJ SOLN
50.0000 ug | INTRAMUSCULAR | Status: DC | PRN
  Administered 2018-02-08 (×2): 50 ug via INTRAVENOUS
  Filled 2018-02-08: qty 2

## 2018-02-08 MED ORDER — DEXTROSE 5 % IV SOLN
120.0000 mg | Freq: Once | INTRAVENOUS | Status: AC
  Administered 2018-02-08: 120 mg via INTRAVENOUS
  Filled 2018-02-08: qty 12

## 2018-02-08 NOTE — ED Notes (Addendum)
Per person who identified themselves as pts caregiver via phone pt is a DNR. EDP Dr Jimmye Norman and admitting MD notified. Unable to verifiy with pt due to intubation and sedation.

## 2018-02-08 NOTE — ED Notes (Signed)
Date and time results received: 02/08/18 1137 (use smartphrase ".now" to insert current time)  Test: Lactic Acid Critical Value: Lactic Acid: 2.8  Name of Provider Notified: Dr. Jimmye Norman

## 2018-02-08 NOTE — ED Notes (Signed)
Pt intubated at [place]

## 2018-02-08 NOTE — ED Notes (Signed)
Unable to draw second set of blood cultures. Dr. Jimmye Norman notified.

## 2018-02-08 NOTE — ED Notes (Signed)
Pt reaching for ett after propofol increased to 30 mcg/kg. Propofol increased to 40 mcg/kg

## 2018-02-08 NOTE — ED Triage Notes (Signed)
Pt arrived via [facility] for respiratory distress. Pt has hx of CHF and COPD. EMS states pt took one duoneb this morning without relief and EMS gave one duoneb without any relief en route. EMS states that O2 sats upon arrival to scene was in the 30s. Pt on cpap upon arrival.

## 2018-02-08 NOTE — ED Notes (Signed)
Date and time results received: 02/08/18 0955 (use smartphrase ".now" to insert current time)  Test: Troponin Critical Value: Trop: 0.05  Name of Provider Notified: Dr. Jimmye Norman

## 2018-02-08 NOTE — ED Provider Notes (Signed)
[facility] Emergency Department Provider Note       Time seen: ----------------------------------------- 9:18 AM on 02/08/2018 ----------------------------------------- Level V caveat: History/ROS limited by respiratory distress and altered mental status  I have reviewed the triage vital signs and the nursing notes.  HISTORY   Chief Complaint Respiratory Distress    HPI Angela Pham is a 77 y.o. female with a history of asthma, CHF, coronary artery disease, hyperlipidemia, hypertension, pulmonary hypertension and diabetes who presents to the ED for respiratory distress.  EMS reports prehospital oxygen saturations were very low less than 50%.  She was placed on CPAP and given a breathing treatment prior to arrival.  Reportedly she was recently here for similar and was placed on BiPAP and went to the ICU.  No further information is available, uncertain how long this shortness of breath episode has been going on.  Past Medical History:  Diagnosis Date  . Asthma   . Chronic combined systolic and diastolic CHF (congestive heart failure) ([place])    a. TTE 02/2017: EF 45-50%, basal amd midanteroseptal HK b. 05/2017: echo showing EF of 35-40%, mild AI, moderate MR, and mildly dilated LA.   Marland Kitchen Coronary artery disease, non-occlusive    a. LHC 02/2017 normal coronary arteries  . Hyperlipidemia   . Hypertension   . Morbid obesity ([place])   . OSA (obstructive sleep apnea)    a. On CPAP w/ O2 bleed in.  . Pulmonary hypertension ([place])   . Type II diabetes mellitus [facility])     Patient Active Problem List   Diagnosis Date Noted  . CHF (congestive heart failure) ([place]) 11/11/2017  . Anemia 09/26/2017  . Monoclonal gammopathy 09/26/2017  . Acute respiratory failure ([place]) 08/24/2017  . Acute URI 08/20/2017  . Acute respiratory failure with hypoxia ([place]) 08/03/2017  . DNR (do not resuscitate) 07/02/2017  . Respiratory failure ([place]) 06/27/2017  . Chronic diastolic heart failure  ([place]) 06/24/2017  . Asthma 06/24/2017  . Diabetes ([place]) 06/24/2017  . Essential hypertension 06/23/2017  . Hyperlipidemia, unspecified 06/23/2017  . CKD (chronic kidney disease) stage 3, GFR 30-59 ml/min (HCC) 06/23/2017  . Pressure injury of skin 06/12/2017  . COPD exacerbation ([place]) 05/28/2017  . SOB (shortness of breath)   . Chronic combined systolic and diastolic heart failure ([place]) 03/07/2017    Past Surgical History:  Procedure Laterality Date  . c-section    . RIGHT/LEFT HEART CATH AND CORONARY ANGIOGRAPHY N/A 03/09/2017   Procedure: Right/Left Heart Cath and Coronary Angiography;  Surgeon: Minna Merritts, MD;  Location: [place] CV LAB;  Service: Cardiovascular;  Laterality: N/A;    Allergies Ace inhibitors and Shellfish allergy  Social History Social History   Tobacco Use  . Smoking status: Former Smoker    Packs/day: 0.30    Years: 20.00    Pack years: 6.00    Types: Cigarettes  . Smokeless tobacco: Never Used  Substance Use Topics  . Alcohol use: No  . Drug use: No   Review of Systems Unknown, positive for shortness of breath and lethargy  All systems otherwise unknown at this point.  ____________________________________________   PHYSICAL EXAM:  VITAL SIGNS: ED Triage Vitals  Enc Vitals Group     BP 02/08/18 0916 (!) 172/79     Pulse Rate 02/08/18 0916 (!) 139     Resp 02/08/18 0916 (!) 30     Temp --      Temp src --      SpO2 02/08/18 0916 95 %  Weight 02/08/18 0915 150 lb (68 kg)     Height 02/08/18 0915 4\' 11"  (1.499 m)     Head Circumference --      Peak Flow --      Pain Score --      Pain Loc --      Pain Edu? --      Excl. in [place]? --    Constitutional: Lethargic but arouses to verbal stimuli, moderate to severe distress Eyes: Conjunctivae are normal. Normal extraocular movements. ENT   Head: Normocephalic and atraumatic.   Nose: No congestion/rhinnorhea.   Mouth/Throat: Mucous membranes are moist.   Neck:  No stridor. Cardiovascular: Rapid rate, regular rhythm. No murmurs, rubs, or gallops. Respiratory: Tachypnea with rales bilaterally Gastrointestinal: Soft and nontender. Normal bowel sounds Musculoskeletal: Nontender with normal range of motion in extremities.  Minimal edema Neurologic: Lethargic but arouses to verbal stimuli and follows commands. Skin:  Skin is warm, diaphoresis is noted Psychiatric: Patient is lethargic ____________________________________________  EKG: Interpreted by me.  Sinus tachycardia with a rate of 140 bpm, wide QRS, long QT, normal axis  ____________________________________________  ED COURSE:  As part of my medical decision making, I reviewed the following data within the [place] History obtained from family if available, nursing notes, old chart and ekg, as well as notes from prior ED visits. Patient presented for respiratory distress, we will assess with labs and imaging as indicated at this time.   Procedure Name: Intubation Date/Time: 02/08/2018 9:43 AM Performed by: Earleen Newport, MD Pre-anesthesia Checklist: Patient identified, Patient being monitored, Emergency Drugs available, Timeout performed and Suction available Oxygen Delivery Method: Non-rebreather mask Preoxygenation: Pre-oxygenation with 100% oxygen Induction Type: Rapid sequence Ventilation: Mask ventilation without difficulty Laryngoscope Size: 4 Tube size: 7.5 mm Number of attempts: 1 Airway Equipment and Method: Video-laryngoscopy Placement Confirmation: ETT inserted through vocal cords under direct vision,  CO2 detector and Breath sounds checked- equal and bilateral Dental Injury: Teeth and Oropharynx as per pre-operative assessment  Difficulty Due To: Difficulty was unanticipated      ____________________________________________   LABS (pertinent positives/negatives)  Labs Reviewed  CBC WITH DIFFERENTIAL/PLATELET - Abnormal; Notable for the  following components:      Result Value   WBC 17.4 (*)    RBC 3.64 (*)    Hemoglobin 10.9 (*)    HCT 34.1 (*)    RDW 16.6 (*)    Neutro Abs 11.1 (*)    Lymphs Abs 4.8 (*)    All other components within normal limits  BASIC METABOLIC PANEL - Abnormal; Notable for the following components:   Glucose, Bld 311 (*)    BUN 66 (*)    Creatinine, Ser 2.03 (*)    Calcium 8.6 (*)    GFR calc non Af Amer 23 (*)    GFR calc Af Amer 27 (*)    All other components within normal limits  TROPONIN I - Abnormal; Notable for the following components:   Troponin I 0.05 (*)    All other components within normal limits  BLOOD GAS, VENOUS - Abnormal; Notable for the following components:   pH, Ven 7.03 (*)    pCO2, Ven 100 (*)    Acid-base deficit 6.2 (*)    All other components within normal limits  CULTURE, BLOOD (ROUTINE X 2)  CULTURE, BLOOD (ROUTINE X 2)  BRAIN NATRIURETIC PEPTIDE  LACTIC ACID, PLASMA   CRITICAL CARE Performed by: Laurence Aly   Total critical care time:  30 minutes  Critical care time was exclusive of separately billable procedures and treating other patients.  Critical care was necessary to treat or prevent imminent or life-threatening deterioration.  Critical care was time spent personally by me on the following activities: development of treatment plan with patient and/or surrogate as well as nursing, discussions with consultants, evaluation of patient's response to treatment, examination of patient, obtaining history from patient or surrogate, ordering and performing treatments and interventions, ordering and review of laboratory studies, ordering and review of radiographic studies, pulse oximetry and re-evaluation of patient's condition.  RADIOLOGY Images were viewed by me  Chest x-ray Reveals middle lobe pneumonia ____________________________________________  DIFFERENTIAL DIAGNOSIS   CHF, COPD, acute respiratory failure, pneumonia, PE  FINAL  ASSESSMENT AND PLAN  Acute respiratory failure with hypoxia and hypercapnia, community-acquired pneumonia   Plan: The patient had presented for respiratory distress. Patient's labs did reveal market acidemia with CO2 retention, chronic kidney disease and leukocytosis likely reflective of her underlying pneumonia. Patient's imaging did confirm pneumonia in the right middle lobe.  She was started on broad-spectrum antibiotics.  Currently she is intubated and sedated.  I will discuss with the hospitalist for ICU admission.   Laurence Aly, MD   Note: This note was generated in part or whole with voice recognition software. Voice recognition is usually quite accurate but there are transcription errors that can and very often do occur. I apologize for any typographical errors that were not detected and corrected.     Earleen Newport, MD 02/08/18 [phone]

## 2018-02-08 NOTE — Progress Notes (Deleted)
Placed on high fowlers position, cuff deflated, suctioned orally and endotracheally and extubated to 2 lpm O2 Nasal canula

## 2018-02-08 NOTE — Care Management (Signed)
RNCM updated [facility] [phone] on patient's admission to ICU/intubation. [facility] services is basically all-inclusive care team providing care to patients. Physicians can access notes in chart. Case may be discussed with [facility] provider at above contact number. RNCM will follow. I asked to speak with case worker at [facility] however call was dropped.

## 2018-02-08 NOTE — H&P (Addendum)
[place] [facility] [place] NAME: Angela Pham    MR#:  [id number]  DATE OF BIRTH:  [birth year]  DATE OF ADMISSION:  02/08/2018  PRIMARY CARE PHYSICIAN: Gareth Morgan, MD   REQUESTING/REFERRING PHYSICIAN:   CHIEF COMPLAINT:   Chief Complaint  Patient presents with  . Respiratory Distress    HISTORY OF PRESENT ILLNESS: Angela Pham  is a 77 y.o. female with a known history per below, discharged from the hospital January 22, 2018 for similar presentation for COPD/congestive heart failure exacerbation, presents with acute respiratory distress from home via EMS, unknown how long the patient has been in distress, per records/the ED attending, patient had O2 saturation of 30% upon arrival, placed on CPAP without improvement, patient actually requested intubation, ER work-up noted for severe respiratory acidosis with pH of 7, PCO2 of 100, noted pneumonia on chest x-ray with white count of 17,000, patient subsequently intubated, hospitalist consulted for admission/care, patient evaluated in the emergency room, sedated/intubated, no family available, patient and chemically induced stupor, patient is now been admitted for acute hypoxic respiratory failure most likely secondary to HCAP and acute on chronic asthma/COPD exacerbation.  PAST MEDICAL HISTORY:   Past Medical History:  Diagnosis Date  . Asthma   . Chronic combined systolic and diastolic CHF (congestive heart failure) ([place])    a. TTE 02/2017: EF 45-50%, basal amd midanteroseptal HK b. 05/2017: echo showing EF of 35-40%, mild AI, moderate MR, and mildly dilated LA.   Marland Kitchen Coronary artery disease, non-occlusive    a. LHC 02/2017 normal coronary arteries  . Hyperlipidemia   . Hypertension   . Morbid obesity ([place])   . OSA (obstructive sleep apnea)    a. On CPAP w/ O2 bleed in.  . Pulmonary hypertension ([place])   . Type II diabetes mellitus ([place])     PAST SURGICAL HISTORY:  Past Surgical History:  Procedure  Laterality Date  . c-section    . RIGHT/LEFT HEART CATH AND CORONARY ANGIOGRAPHY N/A 03/09/2017   Procedure: Right/Left Heart Cath and Coronary Angiography;  Surgeon: Minna Merritts, MD;  Location: [place] [facility] LAB;  Service: Cardiovascular;  Laterality: N/A;    SOCIAL HISTORY:  Social History   Tobacco Use  . Smoking status: Former Smoker    Packs/day: 0.30    Years: 20.00    Pack years: 6.00    Types: Cigarettes  . Smokeless tobacco: Never Used  Substance Use Topics  . Alcohol use: No    FAMILY HISTORY:  Family History  Problem Relation Age of Onset  . CAD Father   . Colon cancer Sister   . Leukemia Brother   . Throat cancer Brother   . Cervical cancer Sister     DRUG ALLERGIES:  Allergies  Allergen Reactions  . Ace Inhibitors Anaphylaxis    Tongue swelling  . Shellfish Allergy Hives and Swelling    Swelling around face and mouth     REVIEW OF SYSTEMS:  Unable to be obtained given acute respiratory failure/encephalopathy, no family available  MEDICATIONS AT HOME:  Prior to Admission medications   Medication Sig Start Date End Date Taking? Authorizing Provider  aspirin EC 81 MG EC tablet Take 1 tablet (81 mg total) by mouth daily. 03/12/17   [name], MD  atorvastatin (LIPITOR) 40 MG tablet Take 40 mg by mouth daily.    [provider]  budesonide (PULMICORT) 0.25 MG/2ML nebulizer solution Take 2 mLs (0.25 mg total) by nebulization 2 (  two) times daily. DX: COPD J44.9 10/06/17   Wilhelmina Mcardle, MD  carvedilol (COREG) 6.25 MG tablet Take 1 tablet (6.25 mg total) by mouth daily. Patient taking differently: Take 6.25 mg by mouth 2 (two) times daily.  11/12/17   [name], MD  guaiFENesin-dextromethorphan (ROBITUSSIN DM) 100-10 MG/5ML syrup Take 5 mLs by mouth every 4 (four) hours as needed for cough. 01/21/18   Lucciana Head, Avel Peace, MD  levocetirizine (XYZAL) 5 MG tablet Take 5 mg by mouth daily.  03/18/17   [provider]  metFORMIN  (GLUCOPHAGE) 1000 MG tablet Take 1 tablet by mouth 2 (two) times daily.     [provider]  mirtazapine (REMERON) 45 MG tablet Take 1 tablet by mouth at bedtime.    [provider]  montelukast (SINGULAIR) 10 MG tablet Take 10 mg by mouth at bedtime.    [provider]  Multiple Vitamins-Minerals (EYE VITAMINS) CAPS Take 1 tablet by mouth 2 (two) times daily.     [provider]  salmeterol (SEREVENT DISKUS) 50 MCG/DOSE diskus inhaler Inhale 1 puff into the lungs 2 (two) times daily. 10/07/17 10/07/18  Wilhelmina Mcardle, MD  senna-docusate (SENOKOT-S) 8.6-50 MG tablet Take 2 tablets by mouth 2 (two) times daily. 07/04/17   Hillary Bow, MD  spironolactone (ALDACTONE) 25 MG tablet Take 1 tablet (25 mg total) by mouth daily. 01/21/18   Shaterrica Territo, Avel Peace, MD  tiotropium (SPIRIVA HANDIHALER) 18 MCG inhalation capsule Place 1 capsule (18 mcg total) into inhaler and inhale daily. 07/04/17 07/04/18  Hillary Bow, MD  torsemide (DEMADEX) 20 MG tablet Take 1 tablet (20 mg total) by mouth 2 (two) times daily. 11/12/17   [name], MD      PHYSICAL EXAMINATION:   VITAL SIGNS: Blood pressure 121/79, pulse (!) 127, temperature 98.1 F (36.7 C), temperature source Axillary, resp. rate 20, height 4\' 11"  (1.499 m), weight 68 kg (150 lb), SpO2 96 %.  GENERAL:  77 y.o.-year-old patient lying in the bed with no acute distress.  Obese EYES: Pupils equal, round, reactive to light and accommodation. No scleral icterus. Extraocular muscles intact.  HEENT: Head atraumatic, normocephalic. Oropharynx and nasopharynx clear.  Intubated-bloody secretions noted in ET tube NECK:  Supple, no jugular venous distention. No thyroid enlargement, no tenderness.  LUNGS: Bilateral diffuse rhonchi. No use of accessory muscles of respiration.  CARDIOVASCULAR: S1, S2 normal. No murmurs, rubs, or gallops.  ABDOMEN: Soft, nontender, nondistended. Bowel sounds present. No organomegaly or mass.   EXTREMITIES: No pedal edema, cyanosis, or clubbing.  NEUROLOGIC: PERRL, chemically induced stupor   PSYCHIATRIC: The patient is in chemically induced stupor   SKIN: No obvious rash, lesion, or ulcer.   LABORATORY PANEL:   CBC Recent Labs  Lab 02/08/18 0920  WBC 17.4*  HGB 10.9*  HCT 34.1*  PLT 309  MCV 93.6  MCH 30.1  MCHC 32.1  RDW 16.6*  LYMPHSABS 4.8*  MONOABS 0.8  EOSABS 0.5  BASOSABS 0.1   ------------------------------------------------------------------------------------------------------------------  Chemistries  Recent Labs  Lab 02/08/18 0920  NA 140  K 4.9  CL 104  CO2 22  GLUCOSE 311*  BUN 66*  CREATININE 2.03*  CALCIUM 8.6*   ------------------------------------------------------------------------------------------------------------------ estimated creatinine clearance is 20.4 mL/min (A) (by C-G formula based on SCr of 2.03 mg/dL (H)). ------------------------------------------------------------------------------------------------------------------ No results for input(s): TSH, T4TOTAL, T3FREE, THYROIDAB in the last 72 hours.  Invalid input(s): FREET3   Coagulation profile No results for input(s): INR, PROTIME in the last 168 hours. -------------------------------------------------------------------------------------------------------------------  No results for input(s): DDIMER in the last 72 hours. -------------------------------------------------------------------------------------------------------------------  Cardiac Enzymes Recent Labs  Lab 02/08/18 0920  TROPONINI 0.05*   ------------------------------------------------------------------------------------------------------------------ Invalid input(s): POCBNP  ---------------------------------------------------------------------------------------------------------------  Urinalysis    Component Value Date/Time   COLORURINE YELLOW (A) 01/18/2018 1307   APPEARANCEUR CLEAR (A)  01/18/2018 1307   LABSPEC 1.009 01/18/2018 1307   PHURINE 5.0 01/18/2018 1307   GLUCOSEU NEGATIVE 01/18/2018 1307   HGBUR NEGATIVE 01/18/2018 1307   BILIRUBINUR NEGATIVE 01/18/2018 1307   KETONESUR NEGATIVE 01/18/2018 1307   PROTEINUR NEGATIVE 01/18/2018 1307   NITRITE NEGATIVE 01/18/2018 1307   LEUKOCYTESUR NEGATIVE 01/18/2018 1307     RADIOLOGY: Dg Chest Port 1 View  Result Date: 02/08/2018 CLINICAL DATA:  Respiratory distress. EXAM: PORTABLE CHEST 1 VIEW COMPARISON:  01/20/2018 FINDINGS: Consolidation noted in the right mid and lower lung concerning for pneumonia. Endotracheal tube is 3 cm above the carina. No definite confluent opacity on the left. Heart is borderline in size. Hyperinflation/COPD. IMPRESSION: Consolidation in the right mid and lower lung concerning for pneumonia. Underlying COPD. Electronically Signed   By: Rolm Baptise M.D.   On: 02/08/2018 10:10    EKG: Orders placed or performed during the hospital encounter of 02/08/18  . EKG 12-Lead  . EKG 12-Lead  . ED EKG  . ED EKG    IMPRESSION AND PLAN: *Acute hypoxic respiratory failure with severe respiratory acidosis Most likely secondary to HCAP and acute on chronic asthma/COPD exacerbation Continue mechanical ventilation, case to be discussed with intensivist when available, admit on our ICU protocol, blood gas at noon, chest x-ray in the morning, rule out acute coronary syndrome with cardiac enzymes x3 sets, and continue close medical monitoring  *Acute on chronic asthma/COPD exacerbation Most likely secondary to pneumonia IV Solu-Medrol with tapering as tolerated, inhaled corticosteroids twice daily, aggressive pulmonary toilet bronchodilator therapy, supplemental oxygen weaning as tolerated, empiric antibiotics per below, follow-up on cultures, check sputum cultures  *Acute HCAP Pneumonia protocol, empiric cefepime/vancomycin, follow-up on cultures  *Chronic obstructive sleep apnea Stable on  vent  *Chronic diabetes mellitus type 2 Sliding scale insulin with Accu-Cheks per routine, Lantus at bedtime, and hold metformin  *History of systolic congestive heart failure Appears compensated Continue aspirin, Lipitor, spironolactone, torsemide, strict I&O monitoring, daily weights  *Chronic benign essential hypertension Stable Continue home regiment  *Sinus tachycardia Most likely secondary to above Gentle IV fluids for rehydration, continue Coreg, rule out acute coronary syndrome as stated above, and continue close medical monitoring  *Chronic stage IV kidney disease At baseline Avoid nephrotoxic agents  All the records are reviewed and case discussed with ED provider. Management plans discussed with the patient, family and they are in agreement.  CODE STATUS:full Code Status History    Date Active Date Inactive Code Status Order ID Comments User Context   01/18/2018 1207 01/22/2018 1522 DNR [id number]  Demetrios Loll, MD ED   11/11/2017 [phone] 11/12/2017 1647 DNR [id number]  Saundra Shelling, MD Inpatient   08/24/2017 1245 08/26/2017 1907 DNR [id number]  Nicholes Mango, MD ED   08/20/2017 1249 08/23/2017 2142 DNR [id number]  Idelle Crouch, MD Inpatient   08/03/2017 1409 08/06/2017 1912 Full Code [id number]  Henreitta Leber, MD ED   06/27/2017 0429 07/04/2017 1659 DNR [id number]  Saundra Shelling, MD Inpatient   06/13/2017 0425 06/16/2017 1816 DNR [id number]  Mikael Spray, NP Inpatient   06/12/2017 0328 06/13/2017 0425 Full Code [id number]  [place], Bradley, DO Inpatient   06/12/2017 0200 06/12/2017 0328 DNR [id number]  [place], [place], [place] ED   05/29/2017 0249 06/02/2017 2123 DNR [id number]  Verlin Dike, RN Inpatient   05/28/2017 2259 05/29/2017 0249 Full Code [id number]  Harvie Bridge, DO Inpatient   05/04/2017 1719 05/08/2017 1718 DNR [id number]  Loletha Grayer, MD ED   03/09/2017 1234 03/11/2017 2208 Full Code [id number]  Minna Merritts, MD Inpatient   03/07/2017 0512 03/09/2017 1234  Full Code [id number]  Saundra Shelling, MD Inpatient    Questions for Most Recent Historical Code Status (Order [id number])    Question Answer Comment   In the event of cardiac or respiratory ARREST Do not call a "code blue"    In the event of cardiac or respiratory ARREST Do not perform Intubation, CPR, defibrillation or ACLS    In the event of cardiac or respiratory ARREST Use medication by any route, position, wound care, and other measures to relive pain and suffering. May use oxygen, suction and manual treatment of airway obstruction as needed for comfort.    Comments RN may pronounce        TOTAL TIME TAKING CARE OF THIS PATIENT: 45 minutes.    Avel Peace Syrah Daughtrey M.D on 02/08/2018   Between 7am to 6pm - Pager - [phone]  After 6pm go to [email or web address] - password [email or web address] [facility]  Office  [phone]  CC: Primary care physician; Gareth Morgan, MD   Note: This dictation was prepared with Dragon dictation along with smaller phrase technology. Any transcriptional errors that result from this process are unintentional.

## 2018-02-08 NOTE — Consult Note (Signed)
Pharmacy Antibiotic Note  Angela Pham is a 77 y.o. female admitted on 02/08/2018 with pneumonia.  Pharmacy has been consulted for Vancomycin dosing. Patient received Vancomycin 1g IV and Zosyn 3.375 IV x 1 dose in ED. Cefepime ordered on admission.   Plan: Ke: 0.021   Vd: 37.1  T1/2: 34h  DW: 53kg  Start Vancomycin 1000 IV every 48 hours with 22 hours stack dosing.  Goal trough 15-20 mcg/mL. Calculated trough at Css is 19. IF MRSA PCR is negative- recommend discontinuation of Vancomycin   Cefepime dose adjusted to every 1gm IV every 24 hours based on current CrCl 11-36ml/min. Will monitor renal function and adjust dose as needed.   Height: 4\' 11"  (149.9 cm) Weight: 150 lb (68 kg) IBW/kg (Calculated) : 43.2  Temp (24hrs), Avg:98.1 F (36.7 C), Min:98.1 F (36.7 C), Max:98.1 F (36.7 C)  Recent Labs  Lab 02/08/18 0920 02/08/18 1014  WBC 17.4*  --   CREATININE 2.03*  --   LATICACIDVEN  --  2.8*    Estimated Creatinine Clearance: 20.4 mL/min (A) (by C-G formula based on SCr of 2.03 mg/dL (H)).    Allergies  Allergen Reactions  . Ace Inhibitors Anaphylaxis    Tongue swelling  . Shellfish Allergy Hives and Swelling    Swelling around face and mouth     Antimicrobials this admission: 5/15 cefepime >>  5/15 vancomycin >>   Microbiology results: 5/15 BCx: Pending 5/15 Sputum: pending  5/15  MRSA PCR: pending  Thank you for allowing pharmacy to be a part of this patient's care.  Pernell Dupre, PharmD, BCPS Clinical Pharmacist 02/08/2018 12:40 PM

## 2018-02-08 NOTE — Consult Note (Signed)
PULMONARY / CRITICAL CARE MEDICINE   Name: Angela Pham MRN: [id number] DOB: 1941-08-23    ADMISSION DATE:  02/08/2018  PT PROFILE: 77 y.o. female never smoker with long standing asthma and repeated hospitalizations 03/2017 to 06/2017 for repeated episodes of hypercarbic respiratory failure. Also has history of CHF.  Admitted via ED with acute respiratory distress and intubated.  CXR concerning for RLL pneumonia  MAJOR EVENTS/TEST RESULTS: 05/15 admission as above  INDWELLING DEVICES:: ETT 05/15 >>   MICRO DATA: MRSA PCR 05/15 >>   Resp 05/15 >>  Blood 05/15 >>  Legionella Ag 05/15 >> Strep Ag 05/15 >>   ANTIMICROBIALS:  Vancomycin 05/15 >>  Cefepime 05/15 >>     HISTORY OF PRESENT ILLNESS:   As above.  ER notes and admission H&P have been reviewed.  Patient is intubated and unable to provide much history.  However, she does indicate that she has had fever, cough, sputum production and chest discomfort.  PAST MEDICAL HISTORY :  She  has a past medical history of Asthma, Chronic combined systolic and diastolic CHF (congestive heart failure) ([place]), Coronary artery disease, non-occlusive, Hyperlipidemia, Hypertension, Morbid obesity ([place]), OSA (obstructive sleep apnea), Pulmonary hypertension ([place]), and Type II diabetes mellitus ([place]).  PAST SURGICAL HISTORY: She  has a past surgical history that includes c-section and RIGHT/LEFT HEART CATH AND CORONARY ANGIOGRAPHY (N/A, 03/09/2017).  Allergies  Allergen Reactions  . Ace Inhibitors Anaphylaxis    Tongue swelling  . Shellfish Allergy Hives and Swelling    Swelling around face and mouth     No current facility-administered medications on file prior to encounter.    Current Outpatient Medications on File Prior to Encounter  Medication Sig  . arformoterol (BROVANA) 15 MCG/2ML NEBU Take 15 mcg by nebulization 2 (two) times daily.  Marland Kitchen aspirin EC 81 MG EC tablet Take 1 tablet (81 mg total) by mouth daily.  Marland Kitchen atorvastatin  (LIPITOR) 40 MG tablet Take 40 mg by mouth every morning.   . budesonide (PULMICORT) 0.25 MG/2ML nebulizer solution Take 2 mLs (0.25 mg total) by nebulization 2 (two) times daily. DX: COPD J44.9  . carvedilol (COREG) 6.25 MG tablet Take 1 tablet (6.25 mg total) by mouth daily.  Marland Kitchen guaiFENesin-dextromethorphan (ROBITUSSIN DM) 100-10 MG/5ML syrup Take 5 mLs by mouth every 4 (four) hours as needed for cough.  . levocetirizine (XYZAL) 5 MG tablet Take 5 mg by mouth daily.   . metFORMIN (GLUCOPHAGE) 1000 MG tablet Take 1 tablet by mouth 2 (two) times daily.   . mirtazapine (REMERON) 45 MG tablet Take 1 tablet by mouth at bedtime.  . montelukast (SINGULAIR) 10 MG tablet Take 10 mg by mouth at bedtime.  . Multiple Vitamins-Minerals (EYE VITAMINS) CAPS Take 1 tablet by mouth 2 (two) times daily.   . potassium chloride SA (K-DUR,KLOR-CON) 20 MEQ tablet Take 20 mEq by mouth daily.  . salmeterol (SEREVENT DISKUS) 50 MCG/DOSE diskus inhaler Inhale 1 puff into the lungs 2 (two) times daily.  Marland Kitchen senna-docusate (SENOKOT-S) 8.6-50 MG tablet Take 2 tablets by mouth 2 (two) times daily.  Marland Kitchen spironolactone (ALDACTONE) 25 MG tablet Take 1 tablet (25 mg total) by mouth daily.  Marland Kitchen tiotropium (SPIRIVA HANDIHALER) 18 MCG inhalation capsule Place 1 capsule (18 mcg total) into inhaler and inhale daily.  Marland Kitchen torsemide (DEMADEX) 20 MG tablet Take 1 tablet (20 mg total) by mouth 2 (two) times daily.    FAMILY HISTORY:  Her indicated that her mother is deceased. She indicated that her father  is deceased. She indicated that both of her sisters are deceased. She indicated that both of her brothers are deceased.   SOCIAL HISTORY: She  reports that she has quit smoking. Her smoking use included cigarettes. She has a 6.00 pack-year smoking history. She has never used smokeless tobacco. She reports that she does not drink alcohol or use drugs.  REVIEW OF SYSTEMS:   Level 5 caveat  SUBJECTIVE:    VITAL SIGNS: BP 112/61   Pulse  (!) 104   Temp 97.7 F (36.5 C)   Resp 18   Ht 4\' 11"  (1.499 m)   Wt 150 lb (68 kg)   SpO2 99%   BMI 30.30 kg/m   HEMODYNAMICS:    VENTILATOR SETTINGS: Vent Mode: PRVC FiO2 (%):  [35 %-40 %] 35 % Set Rate:  [16 bmp-20 bmp] 16 bmp Vt Set:  [450 mL] 450 mL PEEP:  [5 cmH20] 5 cmH20  INTAKE / OUTPUT: No intake/output data recorded.  PHYSICAL EXAMINATION: General: Intubated, sedated on propofol, RASS -2 Neuro: CNs intact, motor and sensory intact HEENT: NCAT, sclerae white Cardiovascular: Regular, no M Lungs: Prolonged expiratory phase, right basilar crackles, no wheezes Abdomen: Soft, diminished BS, nontender Extremities warm, no edema Skin: Limited exam, no lesions noted  LABS:  BMET Recent Labs  Lab 02/08/18 0920  NA 140  K 4.9  CL 104  CO2 22  BUN 66*  CREATININE 2.03*  GLUCOSE 311*    Electrolytes Recent Labs  Lab 02/08/18 0920  CALCIUM 8.6*    CBC Recent Labs  Lab 02/08/18 0920  WBC 17.4*  HGB 10.9*  HCT 34.1*  PLT 309    Coag's No results for input(s): APTT, INR in the last 168 hours.  Sepsis Markers Recent Labs  Lab 02/08/18 1014  LATICACIDVEN 2.8*    ABG Recent Labs  Lab 02/08/18 1024  PHART 7.26*  PCO2ART 56*  PO2ART 81*    Liver Enzymes No results for input(s): AST, ALT, ALKPHOS, BILITOT, ALBUMIN in the last 168 hours.  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 Probable RLL CAP P:   Vent settings established Vent bundle implemented Daily SBT as indicated Nebulized steroids and bronchodilators Systemic steroids  CARDIOVASCULAR A:  History of CHF Chronic IVCD/LBBB Elevated troponin I P:  MAP goal > 65  mmHg Cycle cardiac markers Echocardiogram ordered  RENAL A:   AKI CKD (baseline Cr 1.75) P:   Monitor BMET intermittently Monitor I/Os Correct electrolytes as indicated  Maintenance IVFs ordered  GASTROINTESTINAL A:   No issues P:   SUP: IV famotidine Consider TF protocol in AM 05/16  HEMATOLOGIC A:   Mild chronic anemia P:  DVT px: SQ heparin Monitor CBC intermittently Transfuse per usual guidelines   INFECTIOUS A:   Suspected CAP P:   Monitor temp, WBC count Micro and abx as above  PCT protocol  ENDOCRINE A:   DM 2, poorly controlled  P:   SSI protocol  NEUROLOGIC A:   ICU/vent associated discomfort P:   RASS goal: -1, -2 PAD protocol - propofol, intermittent fentanyl   FAMILY: No family at bedside  CCM time: 35 mins  The above time includes time spent in consultation  with patient and/or family members and reviewing care plan on multidisciplinary rounds  Merton Border, MD PCCM service Mobile [phone] Pager [phone]    02/08/2018, 2:53 PM

## 2018-02-09 ENCOUNTER — Other Ambulatory Visit: Payer: Self-pay

## 2018-02-09 ENCOUNTER — Inpatient Hospital Stay: Payer: Medicare (Managed Care)

## 2018-02-09 DIAGNOSIS — J9621 Acute and chronic respiratory failure with hypoxia: Principal | ICD-10-CM

## 2018-02-09 DIAGNOSIS — Z66 Do not resuscitate: Secondary | ICD-10-CM

## 2018-02-09 DIAGNOSIS — I248 Other forms of acute ischemic heart disease: Secondary | ICD-10-CM

## 2018-02-09 DIAGNOSIS — I5022 Chronic systolic (congestive) heart failure: Secondary | ICD-10-CM

## 2018-02-09 DIAGNOSIS — R531 Weakness: Secondary | ICD-10-CM

## 2018-02-09 DIAGNOSIS — J9602 Acute respiratory failure with hypercapnia: Secondary | ICD-10-CM

## 2018-02-09 DIAGNOSIS — J9601 Acute respiratory failure with hypoxia: Secondary | ICD-10-CM

## 2018-02-09 DIAGNOSIS — Z9911 Dependence on respirator [ventilator] status: Secondary | ICD-10-CM

## 2018-02-09 DIAGNOSIS — I420 Dilated cardiomyopathy: Secondary | ICD-10-CM

## 2018-02-09 DIAGNOSIS — J9622 Acute and chronic respiratory failure with hypercapnia: Secondary | ICD-10-CM

## 2018-02-09 DIAGNOSIS — Z515 Encounter for palliative care: Secondary | ICD-10-CM

## 2018-02-09 DIAGNOSIS — Z7189 Other specified counseling: Secondary | ICD-10-CM

## 2018-02-09 DIAGNOSIS — J181 Lobar pneumonia, unspecified organism: Secondary | ICD-10-CM

## 2018-02-09 LAB — BASIC METABOLIC PANEL
Anion gap: 12 (ref 5–15)
BUN: 65 mg/dL — AB (ref 6–20)
CHLORIDE: 105 mmol/L (ref 101–111)
CO2: 25 mmol/L (ref 22–32)
CREATININE: 1.98 mg/dL — AB (ref 0.44–1.00)
Calcium: 8.9 mg/dL (ref 8.9–10.3)
GFR calc Af Amer: 27 mL/min — ABNORMAL LOW (ref >60)
GFR calc non Af Amer: 24 mL/min — ABNORMAL LOW (ref >60)
Glucose, Bld: 129 mg/dL — ABNORMAL HIGH (ref 65–99)
POTASSIUM: 3.6 mmol/L (ref 3.5–5.1)
SODIUM: 142 mmol/L (ref 135–145)

## 2018-02-09 LAB — GLUCOSE, CAPILLARY
GLUCOSE-CAPILLARY: 119 mg/dL — AB (ref 65–99)
GLUCOSE-CAPILLARY: 111 mg/dL — AB (ref 65–99)
GLUCOSE-CAPILLARY: 224 mg/dL — AB (ref 65–99)
Glucose-Capillary: 112 mg/dL — ABNORMAL HIGH (ref 65–99)
Glucose-Capillary: 179 mg/dL — ABNORMAL HIGH (ref 65–99)

## 2018-02-09 LAB — LEGIONELLA PNEUMOPHILA SEROGP 1 UR AG: L. PNEUMOPHILA SEROGP 1 UR AG: NEGATIVE

## 2018-02-09 LAB — BLOOD CULTURE ID PANEL (REFLEXED)
ACINETOBACTER BAUMANNII: NOT DETECTED
CANDIDA TROPICALIS: NOT DETECTED
Candida albicans: NOT DETECTED
Candida glabrata: NOT DETECTED
Candida krusei: NOT DETECTED
Candida parapsilosis: NOT DETECTED
Enterobacter cloacae complex: NOT DETECTED
Enterobacteriaceae species: NOT DETECTED
Enterococcus species: NOT DETECTED
Escherichia coli: NOT DETECTED
HAEMOPHILUS INFLUENZAE: NOT DETECTED
KLEBSIELLA OXYTOCA: NOT DETECTED
KLEBSIELLA PNEUMONIAE: NOT DETECTED
Listeria monocytogenes: NOT DETECTED
METHICILLIN RESISTANCE: NOT DETECTED
Neisseria meningitidis: NOT DETECTED
PROTEUS SPECIES: NOT DETECTED
Pseudomonas aeruginosa: NOT DETECTED
SERRATIA MARCESCENS: NOT DETECTED
STAPHYLOCOCCUS AUREUS BCID: NOT DETECTED
STAPHYLOCOCCUS SPECIES: DETECTED — AB
Streptococcus agalactiae: NOT DETECTED
Streptococcus pneumoniae: NOT DETECTED
Streptococcus pyogenes: NOT DETECTED
Streptococcus species: NOT DETECTED

## 2018-02-09 LAB — ECHOCARDIOGRAM COMPLETE
HEIGHTINCHES: 59 in
Weight: 2400 oz

## 2018-02-09 LAB — CBC
HCT: 28.5 % — ABNORMAL LOW (ref 35.0–47.0)
Hemoglobin: 9.6 g/dL — ABNORMAL LOW (ref 12.0–16.0)
MCH: 30.2 pg (ref 26.0–34.0)
MCHC: 33.5 g/dL (ref 32.0–36.0)
MCV: 90.3 fL (ref 80.0–100.0)
Platelets: 219 10*3/uL (ref 150–440)
RBC: 3.16 MIL/uL — ABNORMAL LOW (ref 3.80–5.20)
RDW: 16 % — ABNORMAL HIGH (ref 11.5–14.5)
WBC: 13.2 10*3/uL — AB (ref 3.6–11.0)

## 2018-02-09 LAB — PROCALCITONIN: PROCALCITONIN: 2.22 ng/mL

## 2018-02-09 LAB — TROPONIN I: Troponin I: 1.5 ng/mL (ref <0.03)

## 2018-02-09 MED ORDER — INSULIN ASPART 100 UNIT/ML ~~LOC~~ SOLN
0.0000 [IU] | Freq: Every day | SUBCUTANEOUS | Status: DC
  Administered 2018-02-09 – 2018-02-10 (×2): 2 [IU] via SUBCUTANEOUS
  Filled 2018-02-09 (×2): qty 1

## 2018-02-09 MED ORDER — ATORVASTATIN CALCIUM 20 MG PO TABS
40.0000 mg | ORAL_TABLET | Freq: Every day | ORAL | Status: DC
  Administered 2018-02-09 – 2018-02-10 (×2): 40 mg via ORAL
  Filled 2018-02-09 (×2): qty 2

## 2018-02-09 MED ORDER — ENOXAPARIN SODIUM 30 MG/0.3ML ~~LOC~~ SOLN
30.0000 mg | SUBCUTANEOUS | Status: DC
  Administered 2018-02-09 – 2018-02-10 (×2): 30 mg via SUBCUTANEOUS
  Filled 2018-02-09 (×2): qty 0.3

## 2018-02-09 MED ORDER — INSULIN GLARGINE 100 UNIT/ML ~~LOC~~ SOLN
15.0000 [IU] | Freq: Every day | SUBCUTANEOUS | Status: DC
  Administered 2018-02-09: 15 [IU] via SUBCUTANEOUS
  Filled 2018-02-09 (×3): qty 0.15

## 2018-02-09 MED ORDER — BUDESONIDE 0.25 MG/2ML IN SUSP
0.5000 mg | Freq: Two times a day (BID) | RESPIRATORY_TRACT | Status: DC
  Administered 2018-02-09: 0.5 mg via RESPIRATORY_TRACT
  Administered 2018-02-10: 0.25 mg via RESPIRATORY_TRACT
  Administered 2018-02-10 – 2018-02-11 (×2): 0.5 mg via RESPIRATORY_TRACT
  Filled 2018-02-09 (×4): qty 4

## 2018-02-09 MED ORDER — MIRTAZAPINE 15 MG PO TABS
45.0000 mg | ORAL_TABLET | Freq: Every day | ORAL | Status: DC
  Administered 2018-02-09 – 2018-02-10 (×2): 45 mg via ORAL
  Filled 2018-02-09 (×2): qty 3

## 2018-02-09 MED ORDER — PREDNISONE 20 MG PO TABS
20.0000 mg | ORAL_TABLET | Freq: Every day | ORAL | Status: DC
  Administered 2018-02-10 – 2018-02-11 (×2): 20 mg via ORAL
  Filled 2018-02-09 (×2): qty 1

## 2018-02-09 MED ORDER — CARVEDILOL 6.25 MG PO TABS
6.2500 mg | ORAL_TABLET | Freq: Two times a day (BID) | ORAL | Status: DC
  Administered 2018-02-09 – 2018-02-11 (×4): 6.25 mg via ORAL
  Filled 2018-02-09 (×4): qty 1

## 2018-02-09 MED ORDER — TORSEMIDE 20 MG PO TABS
20.0000 mg | ORAL_TABLET | Freq: Two times a day (BID) | ORAL | Status: DC
  Administered 2018-02-09 – 2018-02-11 (×4): 20 mg via ORAL
  Filled 2018-02-09 (×6): qty 1

## 2018-02-09 MED ORDER — ASPIRIN 81 MG PO CHEW
81.0000 mg | CHEWABLE_TABLET | Freq: Every day | ORAL | Status: DC
  Administered 2018-02-10 – 2018-02-11 (×2): 81 mg
  Filled 2018-02-09 (×2): qty 1

## 2018-02-09 MED ORDER — METHYLPREDNISOLONE SODIUM SUCC 40 MG IJ SOLR
40.0000 mg | Freq: Every day | INTRAMUSCULAR | Status: DC
  Administered 2018-02-09: 40 mg via INTRAVENOUS
  Filled 2018-02-09: qty 1

## 2018-02-09 MED ORDER — SPIRONOLACTONE 25 MG PO TABS
25.0000 mg | ORAL_TABLET | Freq: Every day | ORAL | Status: DC
  Administered 2018-02-10 – 2018-02-11 (×2): 25 mg via ORAL
  Filled 2018-02-09 (×2): qty 1

## 2018-02-09 MED ORDER — DOCUSATE SODIUM 50 MG/5ML PO LIQD: 100.0000 mg | Freq: Two times a day (BID) | ORAL | Status: DC | PRN

## 2018-02-09 MED ORDER — DEXTROSE 5 % IV SOLN
INTRAVENOUS | Status: DC
  Administered 2018-02-09: 09:00:00 via INTRAVENOUS

## 2018-02-09 MED ORDER — INSULIN ASPART 100 UNIT/ML ~~LOC~~ SOLN
0.0000 [IU] | Freq: Three times a day (TID) | SUBCUTANEOUS | Status: DC
  Administered 2018-02-09 – 2018-02-10 (×3): 3 [IU] via SUBCUTANEOUS
  Filled 2018-02-09 (×4): qty 1

## 2018-02-09 MED ORDER — MONTELUKAST SODIUM 10 MG PO TABS
10.0000 mg | ORAL_TABLET | Freq: Every day | ORAL | Status: DC
  Administered 2018-02-09 – 2018-02-10 (×2): 10 mg via ORAL
  Filled 2018-02-09 (×2): qty 1

## 2018-02-09 NOTE — Progress Notes (Signed)
[facility] [place] NAME: Angela Pham    MR#:  [id number]  DATE OF BIRTH:  Jul 15, 1941  SUBJECTIVE:    REVIEW OF SYSTEMS:   ROS Tolerating Diet: Tolerating PT:   DRUG ALLERGIES:   Allergies  Allergen Reactions  . Ace Inhibitors Anaphylaxis    Tongue swelling  . Shellfish Allergy Hives and Swelling    Swelling around face and mouth     VITALS:  Blood pressure 125/83, pulse 94, temperature 98.4 F (36.9 C), temperature source Oral, resp. rate 13, height 4\' 11"  (1.499 m), weight 74.7 kg (164 lb 10.9 oz), SpO2 99 %.  PHYSICAL EXAMINATION:   Physical Exam  GENERAL:  77 y.o.-year-old patient lying in the bed with no acute distress.  EYES: Pupils equal, round, reactive to light and accommodation. No scleral icterus. Extraocular muscles intact.  HEENT: Head atraumatic, normocephalic. Oropharynx and nasopharynx clear.  NECK:  Supple, no jugular venous distention. No thyroid enlargement, no tenderness.  LUNGS: Normal breath sounds bilaterally, no wheezing, rales, rhonchi. No use of accessory muscles of respiration.  CARDIOVASCULAR: S1, S2 normal. No murmurs, rubs, or gallops.  ABDOMEN: Soft, nontender, nondistended. Bowel sounds present. No organomegaly or mass.  EXTREMITIES: No cyanosis, clubbing or edema b/l.    NEUROLOGIC: Cranial nerves II through XII are intact. No focal Motor or sensory deficits b/l.   PSYCHIATRIC:  patient is alert and oriented x 3.  SKIN: No obvious rash, lesion, or ulcer.   LABORATORY PANEL:  CBC Recent Labs  Lab 02/09/18 0504  WBC 13.2*  HGB 9.6*  HCT 28.5*  PLT 219    Chemistries  Recent Labs  Lab 02/09/18 0504  NA 142  K 3.6  CL 105  CO2 25  GLUCOSE 129*  BUN 65*  CREATININE 1.98*  CALCIUM 8.9   Cardiac Enzymes Recent Labs  Lab 02/09/18 0039  TROPONINI 1.50*   RADIOLOGY:  Dg Chest Port 1 View  Result Date: 02/09/2018 CLINICAL DATA:  77 year old female with respiratory  distress, intubated. EXAM: PORTABLE CHEST 1 VIEW COMPARISON:  02/08/2018 and earlier. FINDINGS: Portable AP semi upright view at 0402 hours. Endotracheal tube tip projects within 1 centimeter of the carina and could be retracted up to 2 centimeters for more optimal placement. Enteric tube in place and courses to the abdomen, tip not included. Stable cardiac size and mediastinal contours. Increased confluent in veiling opacity behind the heart obscuring the medial portions of the diaphragm. At the same time confluent right lower lung airspace opacity about the hilum has regressed. No pneumothorax or pulmonary edema. Paucity of bowel gas in the upper abdomen. IMPRESSION: 1. Endotracheal tube tip projects within 1 cm of the carina. Retract 1-2 cm for optimal placement. 2. Enteric tube courses to the abdomen, tip not included. 3. Regression of right lower perihilar airspace disease since yesterday, but increasing retrocardiac opacity favored to be bilateral lower lobe atelectasis. Electronically Signed   By: Genevie Ann M.D.   On: 02/09/2018 09:53   Dg Chest Port 1 View  Result Date: 02/08/2018 CLINICAL DATA:  Respiratory distress. EXAM: PORTABLE CHEST 1 VIEW COMPARISON:  01/20/2018 FINDINGS: Consolidation noted in the right mid and lower lung concerning for pneumonia. Endotracheal tube is 3 cm above the carina. No definite confluent opacity on the left. Heart is borderline in size. Hyperinflation/COPD. IMPRESSION: Consolidation in the right mid and lower lung concerning for pneumonia. Underlying COPD. Electronically Signed   By: Rolm Baptise M.D.  On: 02/08/2018 10:10   Dg Abd Portable 1v  Result Date: 02/08/2018 CLINICAL DATA:  77 year old female enteric tube placement. EXAM: PORTABLE ABDOMEN - 1 VIEW COMPARISON:  Portable chest 0917 hours today. FINDINGS: Portable AP view at 1536 hours. Enteric tube courses to the midline of the upper abdomen. The side hole is at the level of the gastric body. Grossly stable  lung bases. Normal visible bowel gas pattern. No acute osseous abnormality identified. IMPRESSION: Enteric tube courses to the stomach, side hole at the level of the gastric body. Electronically Signed   By: Genevie Ann M.D.   On: 02/08/2018 16:17   ASSESSMENT AND PLAN:    Angela Pham  is a 77 y.o. female with a known history per below, discharged from the hospital January 22, 2018 for similar presentation for COPD/congestive heart failure exacerbation, presents with acute respiratory distress from home via EMS, unknown how long the patient has been in distress, per records/the ED attending, patient had O2 saturation of 30% upon arrival, placed on CPAP without improvement, patient actually requested intubation, ER work-up noted for severe respiratory acidosis with pH of 7, PCO2 of 100  *Acute on chronic hypoxic respiratory failure with severe respiratory acidosis -Most likely secondary to HCAP and acute on chronic asthma/COPD exacerbation -patient now extubated -IV antibiotics NPO steroid taper  *Acute on chronic asthma/COPD exacerbation Most likely secondary to pneumonia IV Solu-Medrol with tapering as tolerated, inhaled corticosteroids twice daily, aggressive pulmonary toilet bronchodilator therapy, supplemental oxygen weaning as tolerated, empiric antibiotics per below  *Acute HCAP Pneumonia protocol, empiric cefepime  *Chronic obstructive sleep apnea patient uses CPAP at home  *Chronic diabetes mellitus type 2 Sliding scale insulin with Accu-Cheks per routine, Lantus at bedtime, and hold metformin  *History of systolic congestive heart failure Appears compensated Continue aspirin, Lipitor, spironolactone, torsemide, strict I&O monitoring, daily weights  *Chronic benign essential hypertension Stable Continue home regiment  *Sinus tachycardia Most likely secondary to above cont Coreg  *Chronic stage IV kidney disease At baseline Avoid nephrotoxic agents  transfer to regular  floor  Case discussed with Care Management/Social Worker. Management plans discussed with the patient, family and they are in agreement.  CODE STATUS: full  DVT Prophylaxis: *lovenox*  TOTAL TIME TAKING CARE OF THIS PATIENT: *30* minutes.  >50% time spent on counselling and coordination of care  POSSIBLE D/C IN *1-2* DAYS, DEPENDING ON CLINICAL CONDITION.  Note: This dictation was prepared with Dragon dictation along with smaller phrase technology. Any transcriptional errors that result from this process are unintentional.  Fritzi Mandes M.D on 02/09/2018 at 3:33 PM  Between 7am to 6pm - Pager - [phone]  After 6pm go to [email or web address] - password [email or web address] [facility]  Office  [phone]  CC: Primary care physician; Gareth Morgan, MDPatient ID: Angela Pham, female   DOB: [birth year], 77 y.o.   MRN: [id number]

## 2018-02-09 NOTE — Progress Notes (Signed)
PHARMACY - PHYSICIAN COMMUNICATION CRITICAL VALUE ALERT - BLOOD CULTURE IDENTIFICATION (BCID)  Angela Pham is an 77 y.o. female who presented to [facility] on 02/08/2018 with a chief complaint of   Assessment:  1/4 Staph spp mecA(-) (include suspected source if known)  Name of physician (or Provider) Contacted: Marcille Blanco  Current antibiotics: cefepime  Changes to prescribed antibiotics recommended:    No results found for this or any previous visit.  [name] 02/09/2018  7:03 AM

## 2018-02-09 NOTE — Consult Note (Signed)
Consultation Note Date: 02/09/2018   Patient Name: Angela Pham  DOB: 1941/07/18  MRN: [id number]  Age / Sex: 77 y.o., female  PCP: Angela Morgan, MD Referring Physician: Fritzi Mandes, MD  Reason for Consultation: Establishing goals of care  HPI/Patient Profile: 77 y.o. female  admitted on 02/08/2018 from home with PACE services with acute respiratory distress. 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 Patient was recently discharged from the hospital on 4/28 for similar presentation of COPD/CHF exacerbation. It is unknown on arrival how long she had been in distress. Her oxygen saturation was 30% on arrival with no improvement on CPAP. She requested intubation. ER work-up noted for severe respiratory acidosis with pH of 7, PCO2 of 100, noted pneumonia on chest x-ray with white count of 17,000, patient subsequently intubated, no family available, patient chemically induced stupor. She was admitted for acute hypoxic respiratory failure most likely secondary to HCAP and acute on chronic asthma/COPD exacerbation. Since admission she has successfully been extubated. Palliative Medicine consulted for goals of care discussion.   Clinical Assessment and Goals of Care: I have reviewed medical records including lab results, imaging, Epic notes, and MAR, received report from the bedside RN, and assessed the patient. I then met at the bedside with niece, Angela Pham (documented HCPOA),  to discuss diagnosis prognosis, GOC, EOL wishes, disposition and options. Patient resting and tolerating 3L/nasal cannula post extubation.   I introduced Palliative Medicine as specialized medical care for people living with serious  illness. It focuses on providing relief from the symptoms and stress of a serious illness. The goal is to improve quality of life for both the patient and the family.  We discussed a brief life review of the patient. Niece reports she is the only elderly surviving relative. She does not have any children. Niece reports she was raised by Ms. Angela Pham. She lived in [place] For most of her life and recently moved here with here to [place] about 3 years ago when health started declining. 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 She loved music and is a very energetic and independently woman according to the niece.   As far as functional and nutritional status [name]'s health began declining about 3 years ago when she began having respiratory complications. Over the past year she then was diagnosed with CHF due to weight gain and edema. Her niece reports she is able to ambulate independently without any assistive devices. She does require assistance with ADLs such as bathing and meal preparation at times. She states she can wash herself some in the shower, but needs help with lower areas and getting in and out of the shower. Her appetite is good for the most part unless she is sick. Niece reports it feels that over the past 6 months she has been hospitalized monthly for respiratory or CHF  complications. She feels the patient continues to get weaker, although the patient does not like to admit it and tries to maintain her independency.   We discussed her current illness and what it means in the larger context of her on-going co-morbidities.  Natural disease trajectory and expectations at EOL were discussed.  I attempted to elicit values and goals of care important to the patient.    The difference between aggressive medical intervention and comfort care was considered in light of the patient's goals of care. At this time the patient and niece would like to continue to treat the  treatable. They are aware that frequent hospitalizations may continue to cause her more complications and deconditioning.   Advanced directives, concepts specific to code status, artifical feeding and hydration, and rehospitalization were considered and discussed. At this time patient/niece would like patient to be a DNR/DNI. We discussed what this would mean and they feel that her condition would be worst if she underwent continuous intubation or CPR. She is not interested in artificial feeding measures or tracheostomy. They verbalized understanding and aware that nursing staff will place a DNR bracelet on patient for identification of their request.   Hospice and Palliative Care services outpatient were explained and offered. Family is not prepared to consider hospice service, however appreciative of discussion regarding their services. Family would appreciate having Palliative services outpatient once she is discharged, with awareness of hospice care being readily available when needed or in the future. Patient also has PACE services outpatient.   Questions and concerns were addressed. The family was encouraged to call with questions or concerns.  PMT will continue to support holistically.  HCPOA-Angela Pham (niece)    SUMMARY OF RECOMMENDATIONS    DNR/DNI  Continue to treat the treatable at patient/family request.  Would recommend PT evaluation once patient is stable to participate. Family requesting input on needs once ready for discharge and if inpatient rehab is appropriate?  Would recommend outpatient Palliative services at minimum when discharged as they are not ready to initiate hospice services.  Palliative Medicine team will continue to support patient, family, and medical team during hospitalization.   Code Status/Advance Care Planning:  DNR/DNI as requested patient/niece  Palliative Prophylaxis:   Aspiration, Bowel Regimen, Delirium Protocol, Frequent Pain Assessment, Oral  Care and Turn Reposition  Additional Recommendations (Limitations, Scope, Preferences):  Full Scope Treatment-continue to treat the treatable   Psycho-social/Spiritual:   Desire for further Chaplaincy support:no   Prognosis:   Unable to determine guarded to poor in the setting of acute/chronic respiratory failure with hypoxemia, chronic obstructive asthma, CHF (LVEF 30-35%), chronic LBBB/IVCD, diabetes, and decreased mobility.   Discharge Planning: To Be Determined      Primary Diagnoses: Present on Admission: . Respiratory failure ([place])   I have reviewed the medical record, interviewed the patient and family, and examined the patient. The following aspects are pertinent.  Past Medical History:  Diagnosis Date  . Asthma   . Chronic combined systolic and diastolic CHF (congestive heart failure) ([place])    a. TTE 02/2017: EF 45-50%, basal amd midanteroseptal HK b. 05/2017: echo showing EF of 35-40%, mild AI, moderate MR, and mildly dilated LA.   [place] Kitchen Coronary artery disease, non-occlusive    a. LHC 02/2017 normal coronary arteries  . Hyperlipidemia   . Hypertension   . Morbid obesity ([place])   . OSA (obstructive sleep apnea)    a. On CPAP w/ O2 bleed in.  . Pulmonary hypertension ([place])   .  Type II diabetes mellitus ([place])    Social History   Socioeconomic History  . Marital status: Widowed    Spouse name: Not on file  . Number of children: Not on file  . Years of education: Not on file  . Highest education level: Not on file  Occupational History  . Occupation: retired  Scientific laboratory technician  . Financial resource strain: Not on file  . Food insecurity:    Worry: Not on file    Inability: Not on file  . Transportation needs:    Medical: Not on file    Non-medical: Not on file  Tobacco Use  . Smoking status: Former Smoker    Packs/day: 0.30    Years: 20.00    Pack years: 6.00    Types: Cigarettes  . Smokeless tobacco: Never Used  Substance and Sexual Activity  . Alcohol  use: No  . Drug use: No  . Sexual activity: Never  Lifestyle  . Physical activity:    Days per week: Not on file    Minutes per session: Not on file  . Stress: Not on file  Relationships  . Social connections:    Talks on phone: Not on file    Gets together: Not on file    Attends religious service: Not on file    Active member of club or organization: Not on file    Attends meetings of clubs or organizations: Not on file    Relationship status: Not on file  Other Topics Concern  . Not on file  Social History Narrative  . Not on file   Family History  Problem Relation Age of Onset  . CAD Father   . Colon cancer Sister   . Leukemia Brother   . Throat cancer Brother   . Cervical cancer Sister    Scheduled Meds: . [START ON 02/10/2018] aspirin  81 mg Per Tube Daily  . atorvastatin  40 mg Oral Daily  . budesonide  0.5 mg Nebulization BID  . carvedilol  6.25 mg Oral BID  . chlorhexidine gluconate (MEDLINE KIT)  15 mL Mouth Rinse BID  . enoxaparin (LOVENOX) injection  30 mg Subcutaneous Q24H  . insulin aspart  0-15 Units Subcutaneous TID WC  . insulin aspart  0-5 Units Subcutaneous QHS  . insulin glargine  15 Units Subcutaneous QHS  . ipratropium-albuterol  3 mL Nebulization Q6H  . mouth rinse  15 mL Mouth Rinse 10 times per day  . montelukast  10 mg Oral QHS  . [START ON 02/10/2018] predniSONE  20 mg Oral Q breakfast  . senna-docusate  2 tablet Oral BID  . [START ON 02/10/2018] spironolactone  25 mg Oral Daily  . torsemide  20 mg Oral BID   Continuous Infusions: . sodium chloride    . ceFEPime (MAXIPIME) IV 1 g (02/09/18 1453)   PRN Meds:.sodium chloride, acetaminophen, albuterol, ondansetron (ZOFRAN) IV Medications Prior to Admission:  Prior to Admission medications   Medication Sig Start Date End Date Taking? Authorizing Provider  arformoterol (BROVANA) 15 MCG/2ML NEBU Take 15 mcg by nebulization 2 (two) times daily.   Yes [provider]  aspirin EC 81 MG  EC tablet Take 1 tablet (81 mg total) by mouth daily. 03/12/17  Yes Mody, Ulice Bold, MD  atorvastatin (LIPITOR) 40 MG tablet Take 40 mg by mouth every morning.    Yes [provider]  budesonide (PULMICORT) 0.25 MG/2ML nebulizer solution Take 2 mLs (0.25 mg total) by nebulization 2 (two) times daily.  DX: COPD J44.9 10/06/17  Yes Wilhelmina Mcardle, MD  carvedilol (COREG) 6.25 MG tablet Take 1 tablet (6.25 mg total) by mouth daily. 11/12/17  Yes Mody, Ulice Bold, MD  guaiFENesin-dextromethorphan (ROBITUSSIN DM) 100-10 MG/5ML syrup Take 5 mLs by mouth every 4 (four) hours as needed for cough. 01/21/18  Yes Salary, Avel Peace, MD  levocetirizine (XYZAL) 5 MG tablet Take 5 mg by mouth daily.  03/18/17  Yes [provider]  metFORMIN (GLUCOPHAGE) 1000 MG tablet Take 1 tablet by mouth 2 (two) times daily.    Yes [provider]  mirtazapine (REMERON) 45 MG tablet Take 1 tablet by mouth at bedtime.   Yes [provider]  montelukast (SINGULAIR) 10 MG tablet Take 10 mg by mouth at bedtime.   Yes [provider]  Multiple Vitamins-Minerals (EYE VITAMINS) CAPS Take 1 tablet by mouth 2 (two) times daily.    Yes [provider]  potassium chloride SA (K-DUR,KLOR-CON) 20 MEQ tablet Take 20 mEq by mouth daily.   Yes [provider]  salmeterol (SEREVENT DISKUS) 50 MCG/DOSE diskus inhaler Inhale 1 puff into the lungs 2 (two) times daily. 10/07/17 10/07/18 Yes Wilhelmina Mcardle, MD  senna-docusate (SENOKOT-S) 8.6-50 MG tablet Take 2 tablets by mouth 2 (two) times daily. 07/04/17  Yes Sudini, Alveta Heimlich, MD  spironolactone (ALDACTONE) 25 MG tablet Take 1 tablet (25 mg total) by mouth daily. 01/21/18  Yes Salary, Avel Peace, MD  tiotropium (SPIRIVA HANDIHALER) 18 MCG inhalation capsule Place 1 capsule (18 mcg total) into inhaler and inhale daily. 07/04/17 07/04/18 Yes Sudini, Alveta Heimlich, MD  torsemide (DEMADEX) 20 MG tablet Take 1 tablet (20 mg total) by mouth 2 (two) times daily. 11/12/17   Yes [name], MD   Allergies  Allergen Reactions  . Ace Inhibitors Anaphylaxis    Tongue swelling  . Shellfish Allergy Hives and Swelling    Swelling around face and mouth    Review of Systems  Constitutional: Positive for fatigue.  All other systems reviewed and are negative.   Physical Exam  Constitutional: She is oriented to person, place, and time. She appears ill.  Cardiovascular: Intact distal pulses and normal pulses. An irregular rhythm present.  Pulmonary/Chest: Effort normal. She has decreased breath sounds.  3L/[place]  Abdominal: Normal appearance and bowel sounds are normal.  Neurological: She is alert and oriented to person, place, and time.  Psychiatric: She has a normal mood and affect. Her speech is normal and behavior is normal. Judgment and thought content normal. Cognition and memory are normal.  Nursing note and vitals reviewed.   Vital Signs: BP 125/83   Pulse 94   Temp 98.4 F (36.9 C) (Oral)   Resp 13   Ht _0  (1.499 m)   Wt 74.7 kg (164 lb 10.9 oz)   SpO2 99%   BMI 33.26 kg/m  Pain Scale: CPOT       SpO2: SpO2: 99 % O2 Device:SpO2: 99 % O2 Flow Rate: .   IO: Intake/output summary:   Intake/Output Summary (Last 24 hours) at 02/09/2018 1502 Last data filed at 02/09/2018 1200 Gross per 24 hour  Intake 558.52 ml  Output 2650 ml  Net -2091.48 ml    LBM: Last BM Date: (unknown ) Baseline Weight: Weight: 68 kg (150 lb) Most recent weight: Weight: 74.7 kg (164 lb 10.9 oz)     Palliative Assessment/Data:PPS 40%    Time In: 1115 Time Out: 1230 Time Total: 75 min.   Greater than 50%  of  this time was spent counseling and coordinating care related to the above assessment and plan.  Signed by: Alda Lea, NP-BC Palliative Medicine Team  Phone: [phone] Fax: [phone]   Please contact Palliative Medicine Team phone at [phone] for questions and concerns.  For individual provider: See  Shea Evans

## 2018-02-09 NOTE — Consult Note (Signed)
PULMONARY / CRITICAL CARE MEDICINE   Name: Angela Pham MRN: [id number] DOB: 03-Jan-1941    ADMISSION DATE:  02/08/2018  PT PROFILE: 77 y.o. female never smoker with long standing asthma and repeated hospitalizations 03/2017 to 06/2017 for repeated episodes of hypercarbic respiratory failure. Also has history of CHF.  Admitted via ED with acute respiratory distress and intubated.  CXR concerning for RLL pneumonia  MAJOR EVENTS/TEST RESULTS: 05/15 admission as above 05/15 Echocardiogram: LVEF 30-35% (LVEF was estimated at 45 to 50% 02/2017).  Grade 1 diastolic dysfunction.  Mild to moderate aortic regurgitation.  Moderate mitral regurgitation.  Mildly dilated left atrium.  Mild pulmonary hypertension. 05/16 passed SBT and extubated.  Tolerating well.  Transferred to [place] floor.  Troponin I elevated at 1.8   INDWELLING DEVICES:: ETT 05/15 >> 05/16  MICRO DATA: MRSA PCR 05/15 >> NEG Resp 05/15 >>  Blood 05/15 >>  Legionella Ag 05/15 >> Strep Ag 05/15 >> NEG  ANTIMICROBIALS:  Vancomycin 05/15 >> 05/16 Cefepime 05/15 >>      SUBJECTIVE:  Passed SBT.  Extubated and tolerating well.  Cognition intact.  Denies pain and shortness of breath.  Comfortable on [place] O2  VITAL SIGNS: BP 125/83   Pulse 94   Temp 98.4 F (36.9 C) (Oral)   Resp 13   Ht 4\' 11"  (1.499 m)   Wt 164 lb 10.9 oz (74.7 kg)   SpO2 99%   BMI 33.26 kg/m   HEMODYNAMICS:    VENTILATOR SETTINGS: Vent Mode: PRVC FiO2 (%):  [35 %] 35 % Set Rate:  [16 bmp] 16 bmp Vt Set:  [450 mL] 450 mL PEEP:  [5 cmH20] 5 cmH20  INTAKE / OUTPUT: I/O last 3 completed shifts: In: 596.3 [I.V.:196.3; IV Piggyback:400] Out: 1950 [Urine:1950]  PHYSICAL EXAMINATION: Gen: NAD HEENT: NCAT, sclera white Neck: No JVD Lungs: breath sounds diminished without wheezes.  Right basilar crackles noted Cardiovascular: RRR, no murmurs Abdomen: Soft, nontender, normal BS Ext: without clubbing, cyanosis, edema Neuro: grossly  intact Skin: Limited exam, no lesions noted   LABS:  BMET Recent Labs  Lab 02/08/18 0920 02/09/18 0504  NA 140 142  K 4.9 3.6  CL 104 105  CO2 22 25  BUN 66* 65*  CREATININE 2.03* 1.98*  GLUCOSE 311* 129*    Electrolytes Recent Labs  Lab 02/08/18 0920 02/09/18 0504  CALCIUM 8.6* 8.9    CBC Recent Labs  Lab 02/08/18 0920 02/09/18 0504  WBC 17.4* 13.2*  HGB 10.9* 9.6*  HCT 34.1* 28.5*  PLT 309 219    Coag's No results for input(s): APTT, INR in the last 168 hours.  Sepsis Markers Recent Labs  Lab 02/08/18 0920 02/08/18 1014 02/09/18 0504  LATICACIDVEN  --  2.8*  --   PROCALCITON <0.10  --  2.22    ABG Recent Labs  Lab 02/08/18 1024  PHART 7.26*  PCO2ART 56*  PO2ART 81*    Liver Enzymes No results for input(s): AST, ALT, ALKPHOS, BILITOT, ALBUMIN in the last 168 hours.  Cardiac Enzymes Recent Labs  Lab 02/08/18 1240 02/08/18 1926 02/09/18 0039  TROPONINI 0.59* 1.80* 1.50*    Glucose Recent Labs  Lab 02/08/18 1620 02/08/18 1935 02/08/18 2318 02/09/18 0323 02/09/18 0725 02/09/18 1140  GLUCAP 150* 154* 115* 119* 111* 112*    CXR: Improved aeration RLL  EKG: LBBB pattern (old)   ASSESSMENT / PLAN:  PULMONARY A: Acute/chronic respiratory failure with hypoxemia Chronic obstructive asthma, severe at baseline RLL CAP History of OSA P:  Supplemental oxygen to maintain SPO2 >90% Continue nebulized steroids and bronchodilators Change systemic steroids to prednisone and taper to off over next 3 to 5 days CPAP with sleep ordered  CARDIOVASCULAR A:  Systolic CHF-LVEF 33-54% Chronic IVCD/LBBB Elevated troponin I Primary cardiologist: Arida P:  Echocardiogram as noted above Cardiology consultation requested  RENAL A:   AKI, 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) P:   Monitor BMET intermittently Monitor I/Os Correct electrolytes as indicated   GASTROINTESTINAL A:   No issues P:   Advance diet as  tolerated  HEMATOLOGIC A:   Mild chronic anemia P:  DVT px: SQ heparin Monitor CBC intermittently Transfuse per usual guidelines   INFECTIOUS A:   RLL CAP P:   Monitor temp, WBC count Micro and abx as above  Follow-up on Legionella antigen and sputum culture Continue cefepime for now and narrow as able If sputum culture is unrevealing, would complete 7 days of antibiotics (transition to levofloxacin)  ENDOCRINE A:   DM 2, presently controlled  P:   Continue Lantus SSI protocol changed to ACHS Holding metformin  NEUROLOGIC A:   No acute issues P:   Monitor   DISPO: Transfer to MedSurg floor with cardiac monitoring  FAMILY: Further goals of care discussion had with patient and her niece (HCPOA).  She continues to wish to remain DNR in event of cardiac arrest.  However, she would undergo short-term intubation again if necessary.  She would not wish to undergo tracheostomy tube or long-term mechanical ventilation.  I have asked palliative care consultation to see her and help formalize these directives  CCM time: 40 mins  The above time includes time spent in consultation with patient and/or family members and reviewing care plan on multidisciplinary rounds.  The above time also includes several rechecks after extubation.  I have communicated with Dr. Posey Pronto.  Merton Border, MD PCCM service Mobile [phone] Pager [phone]    02/09/2018, 3:17 PM

## 2018-02-09 NOTE — Progress Notes (Signed)
Pt refused cpap , stated that it will make too much  Noise ,

## 2018-02-09 NOTE — Progress Notes (Signed)
Patient is placed on high fowlers position, cuff deflated, suctioned orally and endotracheally and then extubated to 3 lpm O2 Nasal canula

## 2018-02-09 NOTE — Progress Notes (Addendum)
Great day. Extubated at [place] and has done great since then. Tolerted her diet well. Foley d/ced per policy at 2 pm and voided at 1530 without issues. Monitor shows Sinus Tachy. Report called to [facility] on [place]. Up in room(gait steady) gathering her belongings.

## 2018-02-09 NOTE — Consult Note (Addendum)
Cardiology Consultation:   Patient ID: Angela Pham; [id number]; 08/26/1941   Admit date: 02/08/2018 Date of Consult: 02/09/2018  Primary Care Provider: Gareth Morgan, MD Primary Cardiologist: Fletcher Anon Physician requesting consult: Dr. Fritzi Mandes Reason for Consult: elevated troponin and cardiomyopathy   Patient Profile:   Angela Pham is a 77 y.o. female with a hx of nonobstructive CAD by [facility] 02/2017, chronic combined CHF, PAH, COPD, HTN, asthma, DM2, and HLD  who is being seen today for the evaluation of elevated troponin and cardiomyopathy  History of Present Illness:   Angela Pham  Has had numerous hospitalizations for acute respiratory distress With history of asthma, COPD Presenting with hypoxia, oxygen as low as 50% by EMS, placed on CPAP, Intubated for hypoxia,  In the setting she had elevated troponin peak 1.8 trending down 1.5 Extubated this morning and transferred to the floor  Hospitalization April 24 to  01/22/2018  Respiratory distress, hypoxia/hypercapnia Hospitalization February 2019 respiratory distress/COPD/CHF Hospitalization November 2018 acute respiratory failure COPD exacerbation Hospitalization November 2018 felt secondary to CHF/COPD exacerbation Hospitalization November 2018  Acute respiratory failure with hypoxia Hospitalization October 2018 respiratory failure Hospitalization September 2018 Pneumonia COPD exacerbation Hospitalization September 2018 COPD exacerbation Hospitalization August 2018 cute respiratory failure with hypoxia, bronchitis Hospitalization June 2018 felt secondary to CHF pulmonary edema  [facility] Currently denies any significant chest pain or symptoms concerning for angina Feels close to her baseline Has not been ambulating, on nasal cannula oxygen    Past Medical History:  Diagnosis Date  . Asthma   . Chronic combined systolic and diastolic CHF (congestive heart failure) ([place])    a. TTE 02/2017: EF 45-50%, basal amd  midanteroseptal HK b. 05/2017: echo showing EF of 35-40%, mild AI, moderate MR, and mildly dilated LA.   Marland Kitchen Coronary artery disease, non-occlusive    a. LHC 02/2017 normal coronary arteries  . Hyperlipidemia   . Hypertension   . Morbid obesity ([place])   . OSA (obstructive sleep apnea)    a. On CPAP w/ O2 bleed in.  . Pulmonary hypertension ([place])   . Type II diabetes mellitus ([place])     Past Surgical History:  Procedure Laterality Date  . c-section    . RIGHT/LEFT HEART CATH AND CORONARY ANGIOGRAPHY N/A 03/09/2017   Procedure: Right/Left Heart Cath and Coronary Angiography;  Surgeon: Minna Merritts, MD;  Location: [facility];  Service: Cardiovascular;  Laterality: N/A;    Inpatient Medications: Scheduled Meds: . [START ON 02/10/2018] aspirin  81 mg Per Tube Daily  . atorvastatin  40 mg Oral Daily  . budesonide  0.5 mg Nebulization BID  . carvedilol  6.25 mg Oral BID  . chlorhexidine gluconate (MEDLINE KIT)  15 mL Mouth Rinse BID  . enoxaparin (LOVENOX) injection  30 mg Subcutaneous Q24H  . insulin aspart  0-15 Units Subcutaneous TID WC  . insulin aspart  0-5 Units Subcutaneous QHS  . insulin glargine  15 Units Subcutaneous QHS  . ipratropium-albuterol  3 mL Nebulization Q6H  . mirtazapine  45 mg Oral QHS  . montelukast  10 mg Oral QHS  . [START ON 02/10/2018] predniSONE  20 mg Oral Q breakfast  . senna-docusate  2 tablet Oral BID  . [START ON 02/10/2018] spironolactone  25 mg Oral Daily  . torsemide  20 mg Oral BID   Continuous Infusions: . sodium chloride    . ceFEPime (MAXIPIME) IV Stopped (02/09/18 1525)   PRN Meds: sodium chloride, acetaminophen, albuterol,  ondansetron (ZOFRAN) IV  Allergies:   Allergies  Allergen Reactions  . Ace Inhibitors Anaphylaxis    Tongue swelling  . Shellfish Allergy Hives and Swelling    Swelling around face and mouth     Social History:   Social History   Socioeconomic History  . Marital status: Widowed    Spouse name: Not  on file  . Number of children: Not on file  . Years of education: Not on file  . Highest education level: Not on file  Occupational History  . Occupation: retired  Scientific laboratory technician  . Financial resource strain: Not on file  . Food insecurity:    Worry: Not on file    Inability: Not on file  . Transportation needs:    Medical: Not on file    Non-medical: Not on file  Tobacco Use  . Smoking status: Former Smoker    Packs/day: 0.30    Years: 20.00    Pack years: 6.00    Types: Cigarettes  . Smokeless tobacco: Never Used  Substance and Sexual Activity  . Alcohol use: No  . Drug use: No  . Sexual activity: Never  Lifestyle  . Physical activity:    Days per week: Not on file    Minutes per session: Not on file  . Stress: Not on file  Relationships  . Social connections:    Talks on phone: Not on file    Gets together: Not on file    Attends religious service: Not on file    Active member of club or organization: Not on file    Attends meetings of clubs or organizations: Not on file    Relationship status: Not on file  . Intimate partner violence:    Fear of current or ex partner: Not on file    Emotionally abused: Not on file    Physically abused: Not on file    Forced sexual activity: Not on file  Other Topics Concern  . Not on file  Social History Narrative  . Not on file     Family History:   Family History  Problem Relation Age of Onset  . CAD Father   . Colon cancer Sister   . Leukemia Brother   . Throat cancer Brother   . Cervical cancer Sister     ROS:  Review of Systems  Constitutional: Positive for malaise/fatigue. Negative for chills, diaphoresis, fever and weight loss.  HENT: Negative for congestion.   Eyes: Negative for discharge and redness.  Respiratory: Positive for shortness of breath. Negative for cough, hemoptysis and sputum production.   Cardiovascular: Negative for chest pain, palpitations, orthopnea, claudication and PND.  Gastrointestinal:  Negative for abdominal pain, blood in stool, heartburn, melena, nausea and vomiting.  Genitourinary: Negative for hematuria.  Musculoskeletal: Negative for falls and myalgias.  Skin: Negative for rash.  Neurological: Positive for weakness. Negative for dizziness, tingling, tremors, sensory change, speech change, focal weakness and loss of consciousness.  Endo/Heme/Allergies: Does not bruise/bleed easily.  Psychiatric/Behavioral: Negative for substance abuse. The patient is not nervous/anxious.   All other systems reviewed and are negative.     Physical Exam/Data:   Vitals:   02/09/18 1100 02/09/18 1200 02/09/18 1455 02/09/18 1604  BP: 125/83  114/69 115/62  Pulse: 94  (!) 112 (!) 115  Resp: 13  (!) 24 18  Temp:  98.4 F (36.9 C)  (!) 97 F (36.1 C)  TempSrc:  Oral  Axillary  SpO2: 99%  99% 99%  Weight:      Height:        Intake/Output Summary (Last 24 hours) at 02/09/2018 1911 Last data filed at 02/09/2018 1842 Gross per 24 hour  Intake 522.95 ml  Output 1800 ml  Net -1277.05 ml   Filed Weights   02/08/18 0915 02/09/18 0500  Weight: 150 lb (68 kg) 164 lb 10.9 oz (74.7 kg)   Body mass index is 33.26 kg/m.   Physical Exam: General: Well developed, well nourished, in no acute distress.no distress Head: Normocephalic, atraumatic, sclera non-icteric, no xanthomas, nares without discharge.  Neck: Negative for carotid bruits. JVD difficult to assess given BiPAP straps. Lungs: moderately decreased breath sounds throughout, scant wheezing Heart: RRR with S1 S2. No murmurs, rubs, or gallops appreciated. Abdomen: Soft, non-tender, non-distended with normoactive bowel sounds. No hepatomegaly. No rebound/guarding. No obvious abdominal masses. Msk:  Strength and tone appear normal for age. Extremities: No clubbing or cyanosis. No edema. Distal pedal pulses are 2+ and equal bilaterally. Neuro: Alert and oriented X 3. No facial asymmetry. No focal deficit. Moves all extremities  spontaneously. Psych:  Responds to questions appropriately with a normal affect.   EKG:  EKG showing sinus tachycardia rate 140 bpm left bundle branch block  Telemetry:  Telemetry was personally reviewed and demonstrates:  Sinus tachycardia rate 115 bpm  Weights: Filed Weights   02/08/18 0915 02/09/18 0500  Weight: 150 lb (68 kg) 164 lb 10.9 oz (74.7 kg)    Relevant CV Studies:  Echocardiogram - Left ventricle: The cavity size was mildly dilated. Systolic   function was moderately to severely reduced. The estimated   ejection fraction was in the range of 30% to 35%. Severe   hypokinesis of the anteroseptal and anterior myocardium. Doppler   parameters are consistent with abnormal left ventricular   relaxation (grade 1 diastolic dysfunction). - Aortic valve: There was mild to moderate regurgitation. - Mitral valve: There was moderate regurgitation. - Left atrium: The atrium was mildly dilated. - Pulmonary arteries: Systolic pressure was mildly increased. PA   peak pressure: 40 mm Hg (S). - Pericardium, extracardiac: A trivial pericardial effusion was   identified.   R/LHC 02/2017: Coronary angiography:  Coronary dominance: Right  Left mainstem: Large vessel that bifurcates into the LAD and left circumflex, no significant disease noted  Left anterior descending (LAD): Large vessel that extends to the apical region, diagonal branch 2 of moderate size, no significant disease noted  Left circumflex (LCx): Large vessel with OM branch 2, no significant disease noted  Right coronary artery (RCA): Right dominant vessel with PL and PDA, no significant disease noted  Left ventriculography: Not performed as she had recent echo and has underlying CRI  Final Conclusions:  Normal coronary arteries Wall motion secondary to LBBB Markedly elevated LVEDP Elevated right atrial pressures Numbers consistent with chronic diastolic CHF Blood pressure markedly elevated, tachycardic    Laboratory Data:  Chemistry Recent Labs  Lab 02/08/18 0920 02/09/18 0504  NA 140 142  K 4.9 3.6  CL 104 105  CO2 22 25  GLUCOSE 311* 129*  BUN 66* 65*  CREATININE 2.03* 1.98*  CALCIUM 8.6* 8.9  GFRNONAA 23* 24*  GFRAA 27* 27*  ANIONGAP 14 12    No results for input(s): PROT, ALBUMIN, AST, ALT, ALKPHOS, BILITOT in the last 168 hours. Hematology Recent Labs  Lab 02/08/18 0920 02/09/18 0504  WBC 17.4* 13.2*  RBC 3.64* 3.16*  HGB 10.9* 9.6*  HCT 34.1* 28.5*  MCV 93.6 90.3  MCH  30.1 30.2  MCHC 32.1 33.5  RDW 16.6* 16.0*  PLT 309 219   Cardiac Enzymes Recent Labs  Lab 02/08/18 0920 02/08/18 1240 02/08/18 1926 02/09/18 0039  TROPONINI 0.05* 0.59* 1.80* 1.50*   No results for input(s): TROPIPOC in the last 168 hours.  BNP Recent Labs  Lab 02/08/18 0920  BNP 512.0*    DDimer No results for input(s): DDIMER in the last 168 hours.  Radiology/Studies:  Dg Chest Portable 1 View  Result Date: 01/18/2018 IMPRESSION: Pulmonary vascular congestion with slight bibasilar interstitial edema. Minimal right pleural effusion with mild right base atelectasis. No consolidation. No adenopathy evident. Electronically Signed   By: Lowella Grip III M.D.   On: 01/18/2018 08:01   Dg Chest Port 1 View  Result Date: 01/16/2018 IMPRESSION: Low-grade CHF.  No alveolar pneumonia.  Stable cardiomegaly. Thoracic aortic atherosclerosis. Electronically Signed   By: David  Martinique M.D.   On: 01/16/2018 07:30    Assessment and Plan:   1. Acute on chronic respiratory failure with hypoxia: Frequent hospital admissions for hypercarbic respiratory failure, detailed above Chest x-ray concerning for right lower lobe pneumonia  AECOPD Required intubation on arrival for hypoxia, now extubated,  doing well Followed by pulmonary, on steroids, much improved  2. chronic combined CHF  predominant reason for admission was respiratory distress, COPD exacerbation Would continue regular  outpatient diuretic regiment -continue Coreg, spironolactone,  torsemide 20 twice a day -Not on ACEi/ARB given history of angioedema   3. Elevated troponin/demand ischemia In the setting of respiratory distress, hypoxia, tachycardia No further ischemic workup needed Prior cardiac catheterization 2018 with nonobstructive coronary disease  4. Cardiomyopathy, nonischemic Echocardiogram performed this admission ejection fraction 30-35%  mild to moderately elevated right heart pressures Prior echocardiogram September 2018 ejection fraction 35-40% with moderate MR unable to exclude tachycardia mediated cardiomyopathy -continue carvedilol spironolactone torsemide If blood pressure permits could consider long-acting nitrates/hydralazine  5. CKD stage III: Stable numbers Will climb with overdiuresis  6. HLD: -Lipitor   7. DM2: -Per IM   Total encounter time more than 110 minutes  Greater than 50% was spent in counseling and coordination of care with the patient   For questions or updates, please contact [place] Please consult [email or web address] for contact info under Cardiology/STEMI.   Signed, Signed, Esmond Plants, MD, Ph.D [facility]

## 2018-02-10 LAB — CULTURE, RESPIRATORY W GRAM STAIN

## 2018-02-10 LAB — BASIC METABOLIC PANEL
ANION GAP: 10 (ref 5–15)
BUN: 74 mg/dL — ABNORMAL HIGH (ref 6–20)
CALCIUM: 8.9 mg/dL (ref 8.9–10.3)
CO2: 28 mmol/L (ref 22–32)
CREATININE: 2.26 mg/dL — AB (ref 0.44–1.00)
Chloride: 105 mmol/L (ref 101–111)
GFR calc Af Amer: 23 mL/min — ABNORMAL LOW (ref >60)
GFR, EST NON AFRICAN AMERICAN: 20 mL/min — AB (ref >60)
GLUCOSE: 120 mg/dL — AB (ref 65–99)
Potassium: 3.9 mmol/L (ref 3.5–5.1)
Sodium: 143 mmol/L (ref 135–145)

## 2018-02-10 LAB — PROCALCITONIN: PROCALCITONIN: 1.75 ng/mL

## 2018-02-10 LAB — GLUCOSE, CAPILLARY
GLUCOSE-CAPILLARY: 157 mg/dL — AB (ref 65–99)
GLUCOSE-CAPILLARY: 201 mg/dL — AB (ref 65–99)
Glucose-Capillary: 87 mg/dL (ref 65–99)
Glucose-Capillary: 193 mg/dL — ABNORMAL HIGH (ref 65–99)

## 2018-02-10 LAB — CULTURE, RESPIRATORY: CULTURE: NORMAL

## 2018-02-10 LAB — HIV ANTIBODY (ROUTINE TESTING W REFLEX): HIV Screen 4th Generation wRfx: NONREACTIVE

## 2018-02-10 MED ORDER — OCUVITE-LUTEIN PO CAPS
1.0000 | ORAL_CAPSULE | Freq: Every day | ORAL | Status: DC
  Administered 2018-02-10 – 2018-02-11 (×2): 1 via ORAL
  Filled 2018-02-10 (×2): qty 1

## 2018-02-10 MED ORDER — ARFORMOTEROL TARTRATE 15 MCG/2ML IN NEBU
15.0000 ug | INHALATION_SOLUTION | Freq: Two times a day (BID) | RESPIRATORY_TRACT | Status: DC
  Administered 2018-02-10 – 2018-02-11 (×3): 15 ug via RESPIRATORY_TRACT
  Filled 2018-02-10 (×4): qty 2

## 2018-02-10 MED ORDER — TIOTROPIUM BROMIDE MONOHYDRATE 18 MCG IN CAPS
18.0000 ug | ORAL_CAPSULE | Freq: Every day | RESPIRATORY_TRACT | Status: DC
  Administered 2018-02-10 – 2018-02-11 (×2): 18 ug via RESPIRATORY_TRACT
  Filled 2018-02-10: qty 5

## 2018-02-10 MED ORDER — CEFDINIR 300 MG PO CAPS
300.0000 mg | ORAL_CAPSULE | Freq: Every day | ORAL | Status: DC
  Administered 2018-02-10 – 2018-02-11 (×2): 300 mg via ORAL
  Filled 2018-02-10 (×2): qty 1

## 2018-02-10 NOTE — Telephone Encounter (Signed)
Cancelled appt .  Patient is not authorized for outside [facility] program appt.  Per [facility] she will be fu with there.  Spoke with Niece Elenor. She is aware.

## 2018-02-10 NOTE — Progress Notes (Signed)
[facility] [place] NAME: Angela Pham    MR#:  [id number]  DATE OF BIRTH:  1941/04/10  SUBJECTIVE:   Patient feels a lot better. Out in the chair. REVIEW OF SYSTEMS:   Review of Systems  Constitutional: Negative for chills, fever and weight loss.  HENT: Negative for ear discharge, ear pain and nosebleeds.   Eyes: Negative for blurred vision, pain and discharge.  Respiratory: Negative for sputum production, shortness of breath, wheezing and stridor.   Cardiovascular: Negative for chest pain, palpitations, orthopnea and PND.  Gastrointestinal: Negative for abdominal pain, diarrhea, nausea and vomiting.  Genitourinary: Negative for frequency and urgency.  Musculoskeletal: Negative for back pain and joint pain.  Neurological: Positive for weakness. Negative for sensory change, speech change and focal weakness.  Psychiatric/Behavioral: Negative for depression and hallucinations. The patient is not nervous/anxious.    Tolerating Diet:yes Tolerating PT: ambulatory   DRUG ALLERGIES:   Allergies  Allergen Reactions  . Ace Inhibitors Anaphylaxis    Tongue swelling  . Shellfish Allergy Hives and Swelling    Swelling around face and mouth     VITALS:  Blood pressure 109/64, pulse 91, temperature (!) 97.4 F (36.3 C), temperature source Oral, resp. rate 18, height 4\' 11"  (1.499 m), weight 74.7 kg (164 lb 10.9 oz), SpO2 100 %.  PHYSICAL EXAMINATION:   Physical Exam  GENERAL:  77 y.o.-year-old patient lying in the bed with no acute distress. obese EYES: Pupils equal, round, reactive to light and accommodation. No scleral icterus. Extraocular muscles intact.  HEENT: Head atraumatic, normocephalic. Oropharynx and nasopharynx clear.  NECK:  Supple, no jugular venous distention. No thyroid enlargement, no tenderness.  LUNGS: Normal breath sounds bilaterally, no wheezing, rales, rhonchi. No use of accessory muscles of respiration.   CARDIOVASCULAR: S1, S2 normal. No murmurs, rubs, or gallops.  ABDOMEN: Soft, nontender, nondistended. Bowel sounds present. No organomegaly or mass.  EXTREMITIES: No cyanosis, clubbing or edema b/l.    NEUROLOGIC: Cranial nerves II through XII are intact. No focal Motor or sensory deficits b/l.   PSYCHIATRIC:  patient is alert and oriented x 3.  SKIN: No obvious rash, lesion, or ulcer.   LABORATORY PANEL:  CBC Recent Labs  Lab 02/09/18 0504  WBC 13.2*  HGB 9.6*  HCT 28.5*  PLT 219    Chemistries  Recent Labs  Lab 02/10/18 0515  NA 143  K 3.9  CL 105  CO2 28  GLUCOSE 120*  BUN 74*  CREATININE 2.26*  CALCIUM 8.9   Cardiac Enzymes Recent Labs  Lab 02/09/18 0039  TROPONINI 1.50*   RADIOLOGY:  Dg Chest Port 1 View  Result Date: 02/09/2018 CLINICAL DATA:  78 year old female with respiratory distress, intubated. EXAM: PORTABLE CHEST 1 VIEW COMPARISON:  02/08/2018 and earlier. FINDINGS: Portable AP semi upright view at 0402 hours. Endotracheal tube tip projects within 1 centimeter of the carina and could be retracted up to 2 centimeters for more optimal placement. Enteric tube in place and courses to the abdomen, tip not included. Stable cardiac size and mediastinal contours. Increased confluent in veiling opacity behind the heart obscuring the medial portions of the diaphragm. At the same time confluent right lower lung airspace opacity about the hilum has regressed. No pneumothorax or pulmonary edema. Paucity of bowel gas in the upper abdomen. IMPRESSION: 1. Endotracheal tube tip projects within 1 cm of the carina. Retract 1-2 cm for optimal placement. 2. Enteric tube courses to the abdomen, tip not  included. 3. Regression of right lower perihilar airspace disease since yesterday, but increasing retrocardiac opacity favored to be bilateral lower lobe atelectasis. Electronically Signed   By: Genevie Ann M.D.   On: 02/09/2018 09:53   Dg Abd Portable 1v  Result Date:  02/08/2018 CLINICAL DATA:  77 year old female enteric tube placement. EXAM: PORTABLE ABDOMEN - 1 VIEW COMPARISON:  Portable chest 0917 hours today. FINDINGS: Portable AP view at 1536 hours. Enteric tube courses to the midline of the upper abdomen. The side hole is at the level of the gastric body. Grossly stable lung bases. Normal visible bowel gas pattern. No acute osseous abnormality identified. IMPRESSION: Enteric tube courses to the stomach, side hole at the level of the gastric body. Electronically Signed   By: Genevie Ann M.D.   On: 02/08/2018 16:17   ASSESSMENT AND PLAN:    Angela Pham  is a 77 y.o. female with a known history per below, discharged from the hospital January 22, 2018 for similar presentation for COPD/congestive heart failure exacerbation, presents with acute respiratory distress from home via EMS, unknown how long the patient has been in distress, per records/the ED attending, patient had O2 saturation of 30% upon arrival, placed on CPAP without improvement, patient actually requested intubation, ER work-up noted for severe respiratory acidosis with pH of 7, PCO2 of 100  *Acute on chronic hypoxic respiratory failure with severe respiratory acidosis -Most likely secondary to HCAP and acute on chronic asthma/COPD exacerbation -patient now extubated -IV antibiotics, - steroid taper  *Acute on chronic asthma/COPD exacerbation Most likely secondary to pneumonia Received IV Solu-Medrol with tapering oral  as tolerated, inhaled corticosteroids twice daily, aggressive pulmonary toilet bronchodilator therapy, supplemental oxygen weaning as tolerated, empiric antibiotics per below  *Acute HCAP Pneumonia protocol, empiric cefepime-- change to oral antibiotic  *Chronic obstructive sleep apnea patient uses CPAP at home  *Chronic diabetes mellitus type 2 Sliding scale insulin with Accu-Cheks per routine, Lantus at bedtime, and hold metformin  *History of systolic congestive heart  failure Appears compensated Continue aspirin, Lipitor, spironolactone, torsemide, strict I&O monitoring, daily weights  *Chronic benign essential hypertension Stable Continue home regiment  *Sinus tachycardia Most likely secondary to above cont Coreg  *Chronic stage IV kidney disease At baseline Avoid nephrotoxic agents  overall improving. Will see how she does overnight. Continues to show improvement discharged home tomorrow. Patient agreeable.  Case discussed with Care Management/Social Worker. Management plans discussed with the patient, family and they are in agreement.  CODE STATUS: full  DVT Prophylaxis: *lovenox*  TOTAL TIME TAKING CARE OF THIS PATIENT: *30* minutes.  >50% time spent on counselling and coordination of care  POSSIBLE D/C IN *1-2* DAYS, DEPENDING ON CLINICAL CONDITION.  Note: This dictation was prepared with Dragon dictation along with smaller phrase technology. Any transcriptional errors that result from this process are unintentional.  Fritzi Mandes M.D on 02/10/2018 at 2:27 PM  Between 7am to 6pm - Pager - [phone]  After 6pm go to [email or web address] - password [email or web address] [facility]  Office  [phone]  CC: Primary care physician; Gareth Morgan, MDPatient ID: Angela Pham, female   DOB: 05/01/1941, 77 y.o.   MRN: [id number]

## 2018-02-10 NOTE — Progress Notes (Signed)
Daily Progress Note   Patient Name: Angela Pham       Date: 02/10/2018 DOB: 1941-07-09  Age: 77 y.o. MRN#: [id number] Attending Physician: Fritzi Mandes, MD Primary Care Physician: Gareth Morgan, MD Admit Date: 02/08/2018  Reason for Consultation/Follow-up: Establishing goals of care  Subjective: Patient alert and oriented x3. Sitting up in chair. RN at bedside administering medications. Patient is tolerating oxygen saturations on room air. Denies shortness of breath, pain, or discomfort. She is in a good mood and verbalizes she hopes to be discharged home on tomorrow. She states she will continue with PACE services and outpatient Palliative.   Chart reviewed and report received from bedside RN.   Length of Stay: 2  Current Medications: Scheduled Meds:  . arformoterol  15 mcg Nebulization BID  . aspirin  81 mg Per Tube Daily  . atorvastatin  40 mg Oral Daily  . budesonide  0.5 mg Nebulization BID  . carvedilol  6.25 mg Oral BID  . cefdinir  300 mg Oral Daily  . chlorhexidine gluconate (MEDLINE KIT)  15 mL Mouth Rinse BID  . enoxaparin (LOVENOX) injection  30 mg Subcutaneous Q24H  . insulin aspart  0-15 Units Subcutaneous TID WC  . insulin aspart  0-5 Units Subcutaneous QHS  . mirtazapine  45 mg Oral QHS  . montelukast  10 mg Oral QHS  . multivitamin-lutein  1 capsule Oral Daily  . predniSONE  20 mg Oral Q breakfast  . senna-docusate  2 tablet Oral BID  . spironolactone  25 mg Oral Daily  . tiotropium  18 mcg Inhalation Daily  . torsemide  20 mg Oral BID    Continuous Infusions: . sodium chloride      PRN Meds: sodium chloride, acetaminophen, albuterol, ondansetron (ZOFRAN) IV  Physical Exam  Constitutional: She is oriented to person, place, and time. Vital signs are  normal. She appears well-developed. She is cooperative.  Cardiovascular: Normal rate, regular rhythm, intact distal pulses and normal pulses.  Pulmonary/Chest: Effort normal.  Abdominal: Normal appearance and bowel sounds are normal.  Neurological: She is alert and oriented to person, place, and time.  Psychiatric: She has a normal mood and affect. Her speech is normal and behavior is normal. Judgment and thought content normal. Cognition and memory are normal.  Nursing note and vitals reviewed.  Vital Signs: BP 109/64 (BP Location: Right Arm)   Pulse 91   Temp (!) 97.4 F (36.3 C) (Oral)   Resp 18   Ht 4' 11"  (1.499 m)   Wt 74.7 kg (164 lb 10.9 oz)   SpO2 100%   BMI 33.26 kg/m  SpO2: SpO2: 100 % O2 Device: O2 Device: Nasal Cannula O2 Flow Rate: O2 Flow Rate (L/min): 3 L/min  Intake/output summary:   Intake/Output Summary (Last 24 hours) at 02/10/2018 1553 Last data filed at 02/10/2018 1356 Gross per 24 hour  Intake 960 ml  Output 500 ml  Net 460 ml   LBM: Last BM Date: 02/08/18 Baseline Weight: Weight: 68 kg (150 lb) Most recent weight: Weight: 74.7 kg (164 lb 10.9 oz)       Palliative Assessment/Data:PPS 50%    Flowsheet Rows     Most Recent Value  Intake Tab  Referral Department  Hospitalist  Unit at Time of Referral  Med/Surg Unit  Palliative Care Primary Diagnosis  Pulmonary  Date Notified  02/09/18  Palliative Care Type  Return patient Palliative Care  Reason for referral  Clarify Goals of Care  Date of Admission  02/08/18  Date first seen by Palliative Care  02/09/18  # of days Palliative referral response time  0 Day(s)  # of days IP prior to Palliative referral  1  Clinical Assessment  Psychosocial & Spiritual Assessment  Palliative Care Outcomes      Patient Active Problem List   Diagnosis Date Noted  . CHF (congestive heart failure) ([place]) 11/11/2017  . Anemia 09/26/2017  . Monoclonal gammopathy 09/26/2017  . Acute respiratory failure  ([place]) 08/24/2017  . Acute URI 08/20/2017  . Acute respiratory failure with hypoxia ([place]) 08/03/2017  . DNR (do not resuscitate) 07/02/2017  . Respiratory failure ([place]) 06/27/2017  . Chronic diastolic heart failure ([place]) 06/24/2017  . Asthma 06/24/2017  . Diabetes ([place]) 06/24/2017  . Essential hypertension 06/23/2017  . Hyperlipidemia, unspecified 06/23/2017  . CKD (chronic kidney disease) stage 3, GFR 30-59 ml/min ([facility]) 06/23/2017  . Pressure injury of skin 06/12/2017  . COPD exacerbation ([place]) 05/28/2017  . SOB (shortness of breath)   . Chronic combined systolic and diastolic heart failure ([place]) 03/07/2017    Palliative Care Assessment & Plan   Patient Profile: 77 y.o. female  admitted on 02/08/2018 from home with PACE services with acute respiratory distress. Patient has a past medical history significant for asthma, combined systolic and diastolic CHF (EF 34-28%), non-occlusive coronary artery disease, hyperlipidemia, hypertension, obesity, obstructive sleep apnea (CPAP), pulmonary hypertension, and diabetes. Patient was recently discharged from the hospital on 4/28 for similar presentation of COPD/CHF exacerbation. It is unknown on arrival how long she had been in distress. Her oxygen saturation was 30% on arrival with no improvement on CPAP. She requested intubation. ER work-up noted for severe respiratory acidosis with pH of 7, PCO2 of 100, noted pneumonia on chest x-ray with white count of 17,000, patient subsequently intubated, no family available, patient chemically induced stupor. She was admitted for acute hypoxic respiratory failure most likely secondary toHCAPand acute on chronic asthma/COPD exacerbation. Since admission she has successfully been extubated. Palliative Medicine consulted for goals of care discussion.   Recommendations/Plan:  DNR/DNI  Continue to treat the treatable.  Patient request to continue with PACE services outpatient and palliative services. Denies  need for home equipment.   Goals of Care and Additional Recommendations:  Limitations on Scope of Treatment: Full Scope Treatment  Code Status:  Code Status Orders  (From admission, onward)        Start     Ordered   02/09/18 1057  Do not attempt resuscitation (DNR)  Continuous    Question Answer Comment  In the event of cardiac or respiratory ARREST Do not call a "code blue"   In the event of cardiac or respiratory ARREST Do not perform Intubation, CPR, defibrillation or ACLS   In the event of cardiac or respiratory ARREST Use medication by any route, position, wound care, and other measures to relive pain and suffering. May use oxygen, suction and manual treatment of airway obstruction as needed for comfort.      02/09/18 1058    Code Status History    Date Active Date Inactive Code Status Order ID Comments User Context   02/08/2018 1240 02/09/2018 1058 Full Code [id number]  Gorden Harms, MD Inpatient   02/08/2018 1240 02/08/2018 1240 Full Code [id number]  Gorden Harms, MD Inpatient   01/18/2018 1207 01/22/2018 1522 DNR [id number]  Demetrios Loll, MD ED   11/11/2017 [phone] 11/12/2017 1647 DNR [id number]  Saundra Shelling, MD Inpatient   08/24/2017 1245 08/26/2017 1907 DNR [id number]  Nicholes Mango, MD ED   08/20/2017 1249 08/23/2017 2142 DNR [id number]  Idelle Crouch, MD Inpatient   08/03/2017 1409 08/06/2017 1912 Full Code [id number]  Henreitta Leber, MD ED   06/27/2017 0429 07/04/2017 1659 DNR [id number]  Saundra Shelling, MD Inpatient   06/13/2017 0425 06/16/2017 1816 DNR [id number]  Mikael Spray, NP Inpatient   06/12/2017 0328 06/13/2017 0425 Full Code [id number]  Orestes, [place], DO Inpatient   06/12/2017 0200 06/12/2017 0328 DNR [id number]  Harvie Bridge, DO ED   05/29/2017 0249 06/02/2017 2123 DNR [id number]  Verlin Dike, RN Inpatient   05/28/2017 2259 05/29/2017 0249 Full Code [id number]  Hernandez, [place], DO Inpatient   05/04/2017 1719 05/08/2017 1718 DNR [id number]  Loletha Grayer, MD ED   03/09/2017 1234 03/11/2017 2208 Full Code [id number]  Minna Merritts, MD Inpatient   03/07/2017 0512 03/09/2017 1234 Full Code [id number]  Saundra Shelling, MD Inpatient    Advance Directive Documentation     Most Recent Value  Type of Advance Directive  Living will  Pre-existing out of facility DNR order (yellow form or pink MOST form)  -  "MOST" Form in Place?  -       Prognosis:   Unable to determine -guarded in the setting of acute/chronic respiratory failure with hypoxemia, chronic obstructive asthma, CHF (LVEF 30-35%), chronic LBBB/IVCD, diabetes, and decreased mobility  Discharge Planning:  Home with Home Health (PACE) and outpatient Palliative services with PACE.  Care plan was discussed with patient, niece, bedside RN.   Thank you for allowing the Palliative Medicine Team to assist in the care of this patient.   Total Time 25 min.  Prolonged Time Billed  NO      Greater than 50%  of this time was spent counseling and coordinating care related to the above assessment and plan.  Alda Lea, NP  Please contact Palliative Medicine Team phone at [phone] for questions and concerns.

## 2018-02-10 NOTE — Progress Notes (Signed)
Patient has refused cpap ?

## 2018-02-10 NOTE — Progress Notes (Signed)
Patient was resting. Chaplain will follow-up as requested.

## 2018-02-10 NOTE — Care Management (Signed)
RNCM contacted by Jerrell Belfast from [facility].  Jerrell Belfast states that patient is from home with niece.  Patient has chronic prn O2.  Per Jerrell Belfast previous discharge on 4/28 patient was set up to go to [facility], however after discharge patient decided to go home instead.  Jerrell Belfast states that when the patient is discharged they will arrange any appropriate services in the home.

## 2018-02-11 LAB — CULTURE, BLOOD (ROUTINE X 2): Special Requests: ADEQUATE

## 2018-02-11 LAB — GLUCOSE, CAPILLARY: Glucose-Capillary: 110 mg/dL — ABNORMAL HIGH (ref 65–99)

## 2018-02-11 MED ORDER — CEFDINIR 300 MG PO CAPS: 300.0000 mg | ORAL_CAPSULE | Freq: Every day | ORAL | 0 refills | Status: AC

## 2018-02-11 MED ORDER — TORSEMIDE 20 MG PO TABS: 20.0000 mg | ORAL_TABLET | Freq: Every day | ORAL | Status: AC

## 2018-02-11 NOTE — Discharge Instructions (Signed)
Pt will need to get BMP checked next week with PCP  Continue sugar checks at home

## 2018-02-11 NOTE — Discharge Summary (Signed)
[facility] NAME: Angela Pham    MR#:  [id number]  DATE OF BIRTH:  1941-04-01  DATE OF ADMISSION:  02/08/2018 ADMITTING PHYSICIAN: Gorden Harms, MD  DATE OF DISCHARGE: 02/11/2018  PRIMARY CARE PHYSICIAN: Gareth Morgan, MD    ADMISSION DIAGNOSIS:  Acute respiratory failure with hypoxia and hypercapnia (HCC) [J96.01, J96.02] Community acquired pneumonia of right middle lobe of lung ([place]) [J18.1]  DISCHARGE DIAGNOSIS:  Acute on chronic hypoxic respiratory failure with severe respiratory acidosis AcuteHCAP Chronic stage IV kidney disease SECONDARY DIAGNOSIS:   Past Medical History:  Diagnosis Date  . Asthma   . Chronic combined systolic and diastolic CHF (congestive heart failure) ([place])    a. TTE 02/2017: EF 45-50%, basal amd midanteroseptal HK b. 05/2017: echo showing EF of 35-40%, mild AI, moderate MR, and mildly dilated LA.   [place] Kitchen Coronary artery disease, non-occlusive    a. LHC 02/2017 normal coronary arteries  . Hyperlipidemia   . Hypertension   . Morbid obesity ([place])   . OSA (obstructive sleep apnea)    a. On CPAP w/ O2 bleed in.  . Pulmonary hypertension ([place])   . Type II diabetes mellitus [facility])     HOSPITAL COURSE:   Angela Pham a77 y.o.femalewith a known history per below, discharged from the hospital January 22, 2018 for similar presentation for COPD/congestive heart failure exacerbation, presents with acute respiratory distress from home via EMS, unknown how long the patient has been in distress, per records/the ED attending, patient had O2 saturation of 30% upon arrival, placed on CPAP without improvement, patient actually requested intubation, ER work-up noted for severe respiratory acidosis with pH of 7, PCO2 of 100  *Acute on chronic hypoxic respiratory failure with severe respiratory acidosis -Most likely secondary toHCAPand acute on chronic asthma/COPD exacerbation -patient now  extubated--->92% on RA -po  cefuroxime -recieved steroids in the hospital -BC satph coag 1/2--contamination  *Acute on chronic asthma/COPD exacerbation secondary to pneumonia Received IV Solu-Medrol , inhaled corticosteroids twice daily, aggressive pulmonary toilet bronchodilator therapy, supplemental oxygen weaning as tolerated -pt reports she wears oxygen at night with her CPAP only  *Chronic obstructive sleep apnea patient uses CPAP at home with oxygen  *Chronic diabetes mellitus type 2 Sliding scale insulin with Accu-Cheks per routine and hold metformin due to sligh rise in creat -pt will get BMP checked next week with [facility] and once at baseline defer resuming Metformin to PCP (d/w pt)  *History of systolic congestive heart failure Appears compensated Continue aspirin, Lipitor,  Torsemide (decreased to 20 mg with rise in creat) -pt says spironolactone was d/ced by her PCP  *Chronic benign essential hypertension Continue home regimen  *Sinus tachycardia Most likely secondary to above cont Coreg  Overall improved. D/c home with f/u [facility] next week and get BMP also.   CONSULTS OBTAINED:  Treatment Team:  Minna Merritts, MD  DRUG ALLERGIES:   Allergies  Allergen Reactions  . Ace Inhibitors Anaphylaxis    Tongue swelling  . Shellfish Allergy Hives and Swelling    Swelling around face and mouth     DISCHARGE MEDICATIONS:   Allergies as of 02/11/2018      Reactions   Ace Inhibitors Anaphylaxis   Tongue swelling   Shellfish Allergy Hives, Swelling   Swelling around face and mouth      Medication List    STOP taking these medications   metFORMIN 1000 MG tablet Commonly known as:  GLUCOPHAGE  potassium chloride SA 20 MEQ tablet Commonly known as:  K-DUR,KLOR-CON   spironolactone 25 MG tablet Commonly known as:  ALDACTONE     TAKE these medications   arformoterol 15 MCG/2ML Nebu Commonly known as:  BROVANA Take 15 mcg by nebulization 2 (two)  times daily.   aspirin 81 MG EC tablet Take 1 tablet (81 mg total) by mouth daily.   atorvastatin 40 MG tablet Commonly known as:  LIPITOR Take 40 mg by mouth every morning.   budesonide 0.25 MG/2ML nebulizer solution Commonly known as:  PULMICORT Take 2 mLs (0.25 mg total) by nebulization 2 (two) times daily. DX: COPD J44.9   carvedilol 6.25 MG tablet Commonly known as:  COREG Take 1 tablet (6.25 mg total) by mouth daily.   cefdinir 300 MG capsule Commonly known as:  OMNICEF Take 1 capsule (300 mg total) by mouth daily.   EYE VITAMINS Caps Take 1 tablet by mouth 2 (two) times daily.   guaiFENesin-dextromethorphan 100-10 MG/5ML syrup Commonly known as:  ROBITUSSIN DM Take 5 mLs by mouth every 4 (four) hours as needed for cough.   levocetirizine 5 MG tablet Commonly known as:  XYZAL Take 5 mg by mouth daily.   mirtazapine 45 MG tablet Commonly known as:  REMERON Take 1 tablet by mouth at bedtime.   montelukast 10 MG tablet Commonly known as:  SINGULAIR Take 10 mg by mouth at bedtime.   salmeterol 50 MCG/DOSE diskus inhaler Commonly known as:  SEREVENT DISKUS Inhale 1 puff into the lungs 2 (two) times daily.   senna-docusate 8.6-50 MG tablet Commonly known as:  Senokot-S Take 2 tablets by mouth 2 (two) times daily.   tiotropium 18 MCG inhalation capsule Commonly known as:  SPIRIVA HANDIHALER Place 1 capsule (18 mcg total) into inhaler and inhale daily.   torsemide 20 MG tablet Commonly known as:  DEMADEX Take 1 tablet (20 mg total) by mouth daily. What changed:  when to take this       If you experience worsening of your admission symptoms, develop shortness of breath, life threatening emergency, suicidal or homicidal thoughts you must seek medical attention immediately by calling 911 or calling your MD immediately  if symptoms less severe.  You Must read complete instructions/literature along with all the possible adverse reactions/side effects for all the  Medicines you take and that have been prescribed to you. Take any new Medicines after you have completely understood and accept all the possible adverse reactions/side effects.   Please note  You were cared for by a hospitalist during your hospital stay. If you have any questions about your discharge medications or the care you received while you were in the hospital after you are discharged, you can call the unit and asked to speak with the hospitalist on call if the hospitalist that took care of you is not available. Once you are discharged, your primary care physician will handle any further medical issues. Please note that NO REFILLS for any discharge medications will be authorized once you are discharged, as it is imperative that you return to your primary care physician (or establish a relationship with a primary care physician if you do not have one) for your aftercare needs so that they can reassess your need for medications and monitor your lab values. Today   SUBJECTIVE   I feel better  VITAL SIGNS:  Blood pressure (!) 112/93, pulse 80, temperature 98.6 F (37 C), temperature source Oral, resp. rate 20, height 4\' 11"  (1.499  m), weight 74.7 kg (164 lb 10.9 oz), SpO2 93 %.  I/O:    Intake/Output Summary (Last 24 hours) at 02/11/2018 0749 Last data filed at 02/10/2018 1812 Gross per 24 hour  Intake 960 ml  Output -  Net 960 ml    PHYSICAL EXAMINATION:  GENERAL:  77 y.o.-year-old patient lying in the bed with no acute distress. Obese EYES: Pupils equal, round, reactive to light and accommodation. No scleral icterus. Extraocular muscles intact.  HEENT: Head atraumatic, normocephalic. Oropharynx and nasopharynx clear.  NECK:  Supple, no jugular venous distention. No thyroid enlargement, no tenderness.  LUNGS: Normal breath sounds bilaterally, no wheezing, rales,rhonchi or crepitation. No use of accessory muscles of respiration.  CARDIOVASCULAR: S1, S2 normal. No murmurs, rubs, or  gallops.  ABDOMEN: Soft, non-tender, non-distended. Bowel sounds present. No organomegaly or mass.  EXTREMITIES: No pedal edema, cyanosis, or clubbing.  NEUROLOGIC: Cranial nerves II through XII are intact. Muscle strength 5/5 in all extremities. Sensation intact. Gait not checked.  PSYCHIATRIC: The patient is alert and oriented x 3.  SKIN: No obvious rash, lesion, or ulcer.   DATA REVIEW:   CBC  Recent Labs  Lab 02/09/18 0504  WBC 13.2*  HGB 9.6*  HCT 28.5*  PLT 219    Chemistries  Recent Labs  Lab 02/10/18 0515  NA 143  K 3.9  CL 105  CO2 28  GLUCOSE 120*  BUN 74*  CREATININE 2.26*  CALCIUM 8.9    Microbiology Results   Recent Results (from the past 240 hour(s))  Blood culture (routine x 2)     Status: Abnormal (Preliminary result)   Collection Time: 02/08/18 10:14 AM  Result Value Ref Range Status   Specimen Description   Final    BLOOD BLOOD LEFT FOREARM Performed at [facility], [address] [place], [place] [address]    Special Requests   Final    BOTTLES DRAWN AEROBIC AND ANAEROBIC Blood Culture adequate volume Performed at [facility], [address] [place], [place] [address]    Culture  Setup Time   Final    GRAM POSITIVE COCCI AEROBIC BOTTLE ONLY CRITICAL RESULT CALLED TO, READ BACK BY AND VERIFIED WITH: MATT MCBANE AT 5732 02/09/18 SDR    Culture (A)  Final    STAPHYLOCOCCUS SPECIES (COAGULASE NEGATIVE) THE SIGNIFICANCE OF ISOLATING THIS ORGANISM FROM A SINGLE SET OF BLOOD CULTURES WHEN MULTIPLE SETS ARE DRAWN IS UNCERTAIN. PLEASE NOTIFY THE MICROBIOLOGY DEPARTMENT WITHIN ONE WEEK IF SPECIATION AND SENSITIVITIES ARE REQUIRED. Performed at [facility], [place] [address] [place], [place] [address]    Report Status PENDING  Incomplete  Blood Culture ID Panel (Reflexed)     Status: Abnormal   Collection Time: 02/08/18 10:14 AM  Result Value Ref Range Status   Enterococcus species NOT DETECTED NOT DETECTED Final    Listeria monocytogenes NOT DETECTED NOT DETECTED Final   Staphylococcus species DETECTED (A) NOT DETECTED Final    Comment: Methicillin (oxacillin) susceptible coagulase negative staphylococcus. Possible blood culture contaminant (unless isolated from more than one blood culture draw or clinical case suggests pathogenicity). No antibiotic treatment is indicated for blood  culture contaminants. CRITICAL RESULT CALLED TO, READ BACK BY AND VERIFIED WITH:  MATT MCBANE AT 2706 02/09/18 SDR    Staphylococcus aureus NOT DETECTED NOT DETECTED Final   Methicillin resistance NOT DETECTED NOT DETECTED Final   Streptococcus species NOT DETECTED NOT DETECTED Final   Streptococcus agalactiae NOT DETECTED NOT DETECTED Final  Streptococcus pneumoniae NOT DETECTED NOT DETECTED Final   Streptococcus pyogenes NOT DETECTED NOT DETECTED Final   Acinetobacter baumannii NOT DETECTED NOT DETECTED Final   Enterobacteriaceae species NOT DETECTED NOT DETECTED Final   Enterobacter cloacae complex NOT DETECTED NOT DETECTED Final   Escherichia coli NOT DETECTED NOT DETECTED Final   Klebsiella oxytoca NOT DETECTED NOT DETECTED Final   Klebsiella pneumoniae NOT DETECTED NOT DETECTED Final   Proteus species NOT DETECTED NOT DETECTED Final   Serratia marcescens NOT DETECTED NOT DETECTED Final   Haemophilus influenzae NOT DETECTED NOT DETECTED Final   Neisseria meningitidis NOT DETECTED NOT DETECTED Final   Pseudomonas aeruginosa NOT DETECTED NOT DETECTED Final   Candida albicans NOT DETECTED NOT DETECTED Final   Candida glabrata NOT DETECTED NOT DETECTED Final   Candida krusei NOT DETECTED NOT DETECTED Final   Candida parapsilosis NOT DETECTED NOT DETECTED Final   Candida tropicalis NOT DETECTED NOT DETECTED Final    Comment: Performed at [facility], [address] [place], [place] [address]  Culture, blood (routine x 2) Call MD if unable to obtain prior to antibiotics being given     Status: None  (Preliminary result)   Collection Time: 02/08/18  1:46 PM  Result Value Ref Range Status   Specimen Description BLOOD BLOOD LEFT HAND  Final   Special Requests   Final    BOTTLES DRAWN AEROBIC AND ANAEROBIC Blood Culture results may not be optimal due to an inadequate volume of blood received in culture bottles   Culture   Final    NO GROWTH 3 DAYS Performed at [facility], [address], [place], [place] [address]    Report Status PENDING  Incomplete  MRSA PCR Screening     Status: None   Collection Time: 02/08/18  2:07 PM  Result Value Ref Range Status   MRSA by PCR NEGATIVE NEGATIVE Final    Comment:        The GeneXpert MRSA Assay (FDA approved for NASAL specimens only), is one component of a comprehensive MRSA colonization surveillance program. It is not intended to diagnose MRSA infection nor to guide or monitor treatment for MRSA infections. Performed at [facility], [address] [place], [place] [address]   Culture, respiratory (NON-Expectorated)     Status: None   Collection Time: 02/08/18  5:39 PM  Result Value Ref Range Status   Specimen Description   Final    TRACHEAL ASPIRATE Performed at [facility], [address] [place], [place] [address]    Special Requests   Final    NONE Performed at [facility], [address] [place], [place] [address]    Gram Stain   Final    MODERATE WBC PRESENT, PREDOMINANTLY PMN FEW GRAM POSITIVE RODS RARE GRAM POSITIVE COCCI    Culture   Final    Consistent with normal respiratory flora. Performed at [facility], [place] [address] [place],  [address]    Report Status 02/10/2018 FINAL  Final    RADIOLOGY:  No results found.   Management plans discussed with the patient, family and they are in agreement.  CODE STATUS:     Code Status Orders  (From admission, onward)        Start     Ordered   02/09/18 1057  Do not attempt resuscitation (DNR)   Continuous    Question Answer Comment  In the event of cardiac or respiratory ARREST Do not call a "  code blue"   In the event of cardiac or respiratory ARREST Do not perform Intubation, CPR, defibrillation or ACLS   In the event of cardiac or respiratory ARREST Use medication by any route, position, wound care, and other measures to relive pain and suffering. May use oxygen, suction and manual treatment of airway obstruction as needed for comfort.      02/09/18 1058    Code Status History    Date Active Date Inactive Code Status Order ID Comments User Context   02/08/2018 1240 02/09/2018 1058 Full Code [id number]  Gorden Harms, MD Inpatient   02/08/2018 1240 02/08/2018 1240 Full Code [id number]  Gorden Harms, MD Inpatient   01/18/2018 1207 01/22/2018 1522 DNR [id number]  Demetrios Loll, MD ED   11/11/2017 [phone] 11/12/2017 1647 DNR [id number]  Saundra Shelling, MD Inpatient   08/24/2017 1245 08/26/2017 1907 DNR [id number]  Nicholes Mango, MD ED   08/20/2017 1249 08/23/2017 2142 DNR [id number]  Idelle Crouch, MD Inpatient   08/03/2017 1409 08/06/2017 1912 Full Code [id number]  Henreitta Leber, MD ED   06/27/2017 0429 07/04/2017 1659 DNR [id number]  Saundra Shelling, MD Inpatient   06/13/2017 0425 06/16/2017 1816 DNR [id number]  Mikael Spray, NP Inpatient   06/12/2017 0328 06/13/2017 0425 Full Code [id number]  [place], [place], DO Inpatient   06/12/2017 0200 06/12/2017 0328 DNR [id number]  Harvie Bridge, DO ED   05/29/2017 0249 06/02/2017 2123 DNR [id number]  Verlin Dike, RN Inpatient   05/28/2017 2259 05/29/2017 0249 Full Code [id number]  [place], [place], DO Inpatient   05/04/2017 1719 05/08/2017 1718 DNR [id number]  Loletha Grayer, MD ED   03/09/2017 1234 03/11/2017 2208 Full Code [id number]  Minna Merritts, MD Inpatient   03/07/2017 0512 03/09/2017 1234 Full Code [id number]  Saundra Shelling, MD Inpatient    Advance Directive Documentation     Most Recent Value  Type of Advance Directive  Living will   Pre-existing out of facility DNR order (yellow form or pink MOST form)  -  "MOST" Form in Place?  -      TOTAL TIME TAKING CARE OF THIS PATIENT: *40* minutes.    Fritzi Mandes M.D on 02/11/2018 at 7:49 AM  Between 7am to 6pm - Pager - [phone] After 6pm go to [email or web address] - password [email or web address] [facility]  Office  [phone]  CC: Primary care physician; Gareth Morgan, MD

## 2018-02-11 NOTE — Care Management (Signed)
RNCM spoke with On call [place]  [phone] and notified of patient discharge to home today.  I have asked that [facility] at least reach out to patient today.  RNCM left message with patient's niece Barnett Applebaum [phone] on patient's discharge. RNCM spoke with patient and she is ready to go.  O2 is only nocturnal with CPAP per patient. She states she will be able to ride in a car to home.  Patient has already spoken with her niece Barnett Applebaum. No other RNCM needs.

## 2018-02-11 NOTE — Progress Notes (Signed)
Has d/c instr. Awaiting ride home.

## 2018-02-11 NOTE — Progress Notes (Signed)
Pharmacist Heart Failure Core Measure Documentation  Assessment: Angela Pham has an EF documented as 30-35 on 02/08/18 by echo.  Rationale: Heart failure patients with left ventricular systolic dysfunction (LVSD) and an EF < 40% should be prescribed an angiotensin converting enzyme inhibitor (ACEI) or angiotensin receptor blocker (ARB) at discharge unless a contraindication is documented in the medical record.  This patient is not currently on an ACEI or ARB for HF.  This note is being placed in the record in order to provide documentation that a contraindication to the use of these agents is present for this encounter.  ACE Inhibitor or Angiotensin Receptor Blocker is contraindicated (specify all that apply)  []   ACEI allergy AND ARB allergy []   Angioedema []   Moderate or severe aortic stenosis []   Hyperkalemia [x]   Hypotension []   Renal artery stenosis [x]   Worsening renal function, preexisting renal disease or dysfunction   Ramond Dial 02/11/2018 11:46 AM

## 2018-02-13 ENCOUNTER — Inpatient Hospital Stay: Payer: No Typology Code available for payment source | Admitting: Pulmonary Disease

## 2018-02-13 LAB — CULTURE, BLOOD (ROUTINE X 2): Culture: NO GROWTH

## 2018-02-15 ENCOUNTER — Emergency Department: Payer: Medicare (Managed Care)

## 2018-02-15 ENCOUNTER — Encounter: Payer: Self-pay | Admitting: Emergency Medicine

## 2018-02-15 ENCOUNTER — Other Ambulatory Visit: Payer: Self-pay

## 2018-02-15 ENCOUNTER — Inpatient Hospital Stay
Admission: EM | Admit: 2018-02-15 | Discharge: 2018-02-25 | DRG: 871 | Disposition: E | Payer: Medicare (Managed Care) | Attending: Internal Medicine | Admitting: Internal Medicine

## 2018-02-15 DIAGNOSIS — J9621 Acute and chronic respiratory failure with hypoxia: Secondary | ICD-10-CM | POA: Diagnosis present

## 2018-02-15 DIAGNOSIS — N183 Chronic kidney disease, stage 3 (moderate): Secondary | ICD-10-CM | POA: Diagnosis present

## 2018-02-15 DIAGNOSIS — Z8049 Family history of malignant neoplasm of other genital organs: Secondary | ICD-10-CM

## 2018-02-15 DIAGNOSIS — J181 Lobar pneumonia, unspecified organism: Secondary | ICD-10-CM | POA: Diagnosis present

## 2018-02-15 DIAGNOSIS — G4733 Obstructive sleep apnea (adult) (pediatric): Secondary | ICD-10-CM | POA: Diagnosis present

## 2018-02-15 DIAGNOSIS — Z515 Encounter for palliative care: Secondary | ICD-10-CM | POA: Diagnosis present

## 2018-02-15 DIAGNOSIS — I272 Pulmonary hypertension, unspecified: Secondary | ICD-10-CM | POA: Diagnosis present

## 2018-02-15 DIAGNOSIS — E785 Hyperlipidemia, unspecified: Secondary | ICD-10-CM | POA: Diagnosis present

## 2018-02-15 DIAGNOSIS — G9340 Encephalopathy, unspecified: Secondary | ICD-10-CM | POA: Diagnosis present

## 2018-02-15 DIAGNOSIS — Z800 Family history of malignant neoplasm of digestive organs: Secondary | ICD-10-CM

## 2018-02-15 DIAGNOSIS — J189 Pneumonia, unspecified organism: Secondary | ICD-10-CM

## 2018-02-15 DIAGNOSIS — I5042 Chronic combined systolic (congestive) and diastolic (congestive) heart failure: Secondary | ICD-10-CM | POA: Diagnosis present

## 2018-02-15 DIAGNOSIS — Y95 Nosocomial condition: Secondary | ICD-10-CM | POA: Diagnosis present

## 2018-02-15 DIAGNOSIS — J441 Chronic obstructive pulmonary disease with (acute) exacerbation: Secondary | ICD-10-CM | POA: Diagnosis present

## 2018-02-15 DIAGNOSIS — Z8249 Family history of ischemic heart disease and other diseases of the circulatory system: Secondary | ICD-10-CM

## 2018-02-15 DIAGNOSIS — I2510 Atherosclerotic heart disease of native coronary artery without angina pectoris: Secondary | ICD-10-CM | POA: Diagnosis present

## 2018-02-15 DIAGNOSIS — N179 Acute kidney failure, unspecified: Secondary | ICD-10-CM | POA: Diagnosis present

## 2018-02-15 DIAGNOSIS — I130 Hypertensive heart and chronic kidney disease with heart failure and stage 1 through stage 4 chronic kidney disease, or unspecified chronic kidney disease: Secondary | ICD-10-CM | POA: Diagnosis present

## 2018-02-15 DIAGNOSIS — A419 Sepsis, unspecified organism: Secondary | ICD-10-CM | POA: Diagnosis present

## 2018-02-15 DIAGNOSIS — Z6833 Body mass index (BMI) 33.0-33.9, adult: Secondary | ICD-10-CM | POA: Diagnosis not present

## 2018-02-15 DIAGNOSIS — E119 Type 2 diabetes mellitus without complications: Secondary | ICD-10-CM | POA: Diagnosis present

## 2018-02-15 DIAGNOSIS — J9622 Acute and chronic respiratory failure with hypercapnia: Secondary | ICD-10-CM | POA: Diagnosis present

## 2018-02-15 DIAGNOSIS — J440 Chronic obstructive pulmonary disease with acute lower respiratory infection: Secondary | ICD-10-CM | POA: Diagnosis present

## 2018-02-15 DIAGNOSIS — J9601 Acute respiratory failure with hypoxia: Secondary | ICD-10-CM | POA: Diagnosis present

## 2018-02-15 DIAGNOSIS — Z87891 Personal history of nicotine dependence: Secondary | ICD-10-CM | POA: Diagnosis not present

## 2018-02-15 DIAGNOSIS — J9600 Acute respiratory failure, unspecified whether with hypoxia or hypercapnia: Secondary | ICD-10-CM | POA: Diagnosis present

## 2018-02-15 DIAGNOSIS — Z806 Family history of leukemia: Secondary | ICD-10-CM | POA: Diagnosis not present

## 2018-02-15 DIAGNOSIS — Z808 Family history of malignant neoplasm of other organs or systems: Secondary | ICD-10-CM

## 2018-02-15 DIAGNOSIS — J9602 Acute respiratory failure with hypercapnia: Secondary | ICD-10-CM

## 2018-02-15 DIAGNOSIS — Z66 Do not resuscitate: Secondary | ICD-10-CM | POA: Diagnosis present

## 2018-02-15 DIAGNOSIS — I509 Heart failure, unspecified: Secondary | ICD-10-CM

## 2018-02-15 LAB — BASIC METABOLIC PANEL
Anion gap: 9 (ref 5–15)
BUN: 68 mg/dL — ABNORMAL HIGH (ref 6–20)
CHLORIDE: 102 mmol/L (ref 101–111)
CO2: 27 mmol/L (ref 22–32)
Calcium: 8.8 mg/dL — ABNORMAL LOW (ref 8.9–10.3)
Creatinine, Ser: 2.24 mg/dL — ABNORMAL HIGH (ref 0.44–1.00)
GFR calc non Af Amer: 20 mL/min — ABNORMAL LOW (ref >60)
GFR, EST AFRICAN AMERICAN: 24 mL/min — AB (ref >60)
Glucose, Bld: 290 mg/dL — ABNORMAL HIGH (ref 65–99)
POTASSIUM: 4.6 mmol/L (ref 3.5–5.1)
SODIUM: 138 mmol/L (ref 135–145)

## 2018-02-15 LAB — BLOOD GAS, ARTERIAL
ACID-BASE DEFICIT: 1.2 mmol/L (ref 0.0–2.0)
Allens test (pass/fail): POSITIVE — AB
BICARBONATE: 27.7 mmol/L (ref 20.0–28.0)
FIO2: 0.5
LHR: 10 {breaths}/min
O2 Saturation: 98 %
PATIENT TEMPERATURE: 37
VT: 350 mL
pCO2 arterial: 71 mmHg (ref 32.0–48.0)
pH, Arterial: 7.2 — ABNORMAL LOW (ref 7.350–7.450)
pO2, Arterial: 125 mmHg — ABNORMAL HIGH (ref 83.0–108.0)

## 2018-02-15 LAB — GLUCOSE, CAPILLARY: Glucose-Capillary: 209 mg/dL — ABNORMAL HIGH (ref 65–99)

## 2018-02-15 LAB — URINALYSIS, ROUTINE W REFLEX MICROSCOPIC
Bilirubin Urine: NEGATIVE
GLUCOSE, UA: 50 mg/dL — AB
Hgb urine dipstick: NEGATIVE
Ketones, ur: NEGATIVE mg/dL
LEUKOCYTES UA: NEGATIVE
Nitrite: NEGATIVE
PH: 5 (ref 5.0–8.0)
PROTEIN: NEGATIVE mg/dL
Specific Gravity, Urine: 1.011 (ref 1.005–1.030)

## 2018-02-15 LAB — CBC
HEMATOCRIT: 32.2 % — AB (ref 35.0–47.0)
Hemoglobin: 10.5 g/dL — ABNORMAL LOW (ref 12.0–16.0)
MCH: 30.5 pg (ref 26.0–34.0)
MCHC: 32.5 g/dL (ref 32.0–36.0)
MCV: 93.8 fL (ref 80.0–100.0)
Platelets: 384 10*3/uL (ref 150–440)
RBC: 3.43 MIL/uL — AB (ref 3.80–5.20)
RDW: 16.2 % — ABNORMAL HIGH (ref 11.5–14.5)
WBC: 20 10*3/uL — AB (ref 3.6–11.0)

## 2018-02-15 LAB — PROCALCITONIN

## 2018-02-15 LAB — LACTIC ACID, PLASMA
LACTIC ACID, VENOUS: 1.2 mmol/L (ref 0.5–1.9)
Lactic Acid, Venous: 3.4 mmol/L (ref 0.5–1.9)

## 2018-02-15 LAB — MRSA PCR SCREENING: MRSA BY PCR: NEGATIVE

## 2018-02-15 LAB — TROPONIN I: Troponin I: 0.06 ng/mL (ref <0.03)

## 2018-02-15 MED ORDER — METHYLPREDNISOLONE SODIUM SUCC 125 MG IJ SOLR
125.0000 mg | Freq: Once | INTRAMUSCULAR | Status: AC
  Administered 2018-02-15: 125 mg via INTRAVENOUS

## 2018-02-15 MED ORDER — PROPOFOL 1000 MG/100ML IV EMUL
5.0000 ug/kg/min | Freq: Once | INTRAVENOUS | Status: AC
  Administered 2018-02-15: 45.027 ug/kg/min via INTRAVENOUS

## 2018-02-15 MED ORDER — ARFORMOTEROL TARTRATE 15 MCG/2ML IN NEBU
15.0000 ug | INHALATION_SOLUTION | Freq: Two times a day (BID) | RESPIRATORY_TRACT | Status: DC
  Filled 2018-02-15 (×2): qty 2

## 2018-02-15 MED ORDER — SENNOSIDES-DOCUSATE SODIUM 8.6-50 MG PO TABS
2.0000 | ORAL_TABLET | Freq: Two times a day (BID) | ORAL | Status: DC
  Administered 2018-02-15: 2 via ORAL
  Filled 2018-02-15: qty 2

## 2018-02-15 MED ORDER — ASPIRIN 81 MG PO CHEW
81.0000 mg | CHEWABLE_TABLET | Freq: Every day | ORAL | Status: DC
  Administered 2018-02-15: 81 mg
  Filled 2018-02-15: qty 1

## 2018-02-15 MED ORDER — DOCUSATE SODIUM 100 MG PO CAPS
100.0000 mg | ORAL_CAPSULE | Freq: Two times a day (BID) | ORAL | Status: DC | PRN
Start: 2018-02-15 — End: 2018-02-15

## 2018-02-15 MED ORDER — LORAZEPAM 2 MG/ML IJ SOLN
2.0000 mg | INTRAMUSCULAR | Status: DC | PRN
  Administered 2018-02-15 (×2): 2 mg via INTRAVENOUS
  Filled 2018-02-15 (×2): qty 1

## 2018-02-15 MED ORDER — FENTANYL 2500MCG IN NS 250ML (10MCG/ML) PREMIX INFUSION
25.0000 ug/h | INTRAVENOUS | Status: DC
Start: 2018-02-15 — End: 2018-02-15

## 2018-02-15 MED ORDER — FENTANYL CITRATE (PF) 100 MCG/2ML IJ SOLN: 50.0000 ug | Freq: Once | INTRAMUSCULAR | Status: DC

## 2018-02-15 MED ORDER — SUCCINYLCHOLINE CHLORIDE 20 MG/ML IJ SOLN
INTRAMUSCULAR | Status: AC | PRN
  Administered 2018-02-15: 100 mg via INTRAVENOUS

## 2018-02-15 MED ORDER — CHLORHEXIDINE GLUCONATE 0.12% ORAL RINSE (MEDLINE KIT): 15.0000 mL | Freq: Two times a day (BID) | OROMUCOSAL | Status: DC

## 2018-02-15 MED ORDER — METHYLPREDNISOLONE SODIUM SUCC 40 MG IJ SOLR: 40.0000 mg | Freq: Two times a day (BID) | INTRAMUSCULAR | Status: DC

## 2018-02-15 MED ORDER — IPRATROPIUM-ALBUTEROL 0.5-2.5 (3) MG/3ML IN SOLN
RESPIRATORY_TRACT | Status: AC
  Administered 2018-02-15: 3 mL
  Filled 2018-02-15: qty 9

## 2018-02-15 MED ORDER — MORPHINE SULFATE (PF) 4 MG/ML IV SOLN: 4.0000 mg | Freq: Once | INTRAVENOUS | Status: DC

## 2018-02-15 MED ORDER — EYE VITAMINS PO CAPS: ORAL_CAPSULE | Freq: Two times a day (BID) | ORAL | Status: DC

## 2018-02-15 MED ORDER — IPRATROPIUM-ALBUTEROL 0.5-2.5 (3) MG/3ML IN SOLN
3.0000 mL | RESPIRATORY_TRACT | Status: DC
  Filled 2018-02-15: qty 3

## 2018-02-15 MED ORDER — TIOTROPIUM BROMIDE MONOHYDRATE 18 MCG IN CAPS: 18.0000 ug | ORAL_CAPSULE | Freq: Every day | RESPIRATORY_TRACT | Status: DC

## 2018-02-15 MED ORDER — ATORVASTATIN CALCIUM 20 MG PO TABS: 40.0000 mg | ORAL_TABLET | Freq: Every day | ORAL | Status: DC

## 2018-02-15 MED ORDER — MIRTAZAPINE 15 MG PO TABS: 45.0000 mg | ORAL_TABLET | Freq: Every day | ORAL | Status: DC

## 2018-02-15 MED ORDER — HEPARIN SODIUM (PORCINE) 5000 UNIT/ML IJ SOLN: 5000.0000 [IU] | Freq: Three times a day (TID) | INTRAMUSCULAR | Status: DC

## 2018-02-15 MED ORDER — ASPIRIN 81 MG PO TBEC: 81.0000 mg | DELAYED_RELEASE_TABLET | Freq: Every day | ORAL | Status: DC

## 2018-02-15 MED ORDER — MORPHINE SULFATE (PF) 4 MG/ML IV SOLN
INTRAVENOUS | Status: AC
Start: 2018-02-15 — End: 2018-02-15
  Administered 2018-02-15: 4 mg via INTRAVENOUS
  Filled 2018-02-15: qty 1

## 2018-02-15 MED ORDER — CEFEPIME HCL 1 G IJ SOLR
1.0000 g | Freq: Once | INTRAMUSCULAR | Status: AC
  Administered 2018-02-15: 1 g via INTRAVENOUS
  Filled 2018-02-15: qty 1

## 2018-02-15 MED ORDER — MORPHINE 100MG IN NS 100ML (1MG/ML) PREMIX INFUSION
10.0000 mg/h | INTRAVENOUS | Status: DC
  Filled 2018-02-15: qty 100

## 2018-02-15 MED ORDER — PROPOFOL 1000 MG/100ML IV EMUL
INTRAVENOUS | Status: AC
  Filled 2018-02-15: qty 100

## 2018-02-15 MED ORDER — MAGNESIUM SULFATE 2 GM/50ML IV SOLN
2.0000 g | Freq: Once | INTRAVENOUS | Status: AC
  Administered 2018-02-15: 2 g via INTRAVENOUS

## 2018-02-15 MED ORDER — ETOMIDATE 2 MG/ML IV SOLN
INTRAVENOUS | Status: AC | PRN
  Administered 2018-02-15: 20 mg via INTRAVENOUS

## 2018-02-15 MED ORDER — BUDESONIDE 0.25 MG/2ML IN SUSP: 0.5000 mg | Freq: Two times a day (BID) | RESPIRATORY_TRACT | Status: DC

## 2018-02-15 MED ORDER — MONTELUKAST SODIUM 10 MG PO TABS: 10.0000 mg | ORAL_TABLET | Freq: Every day | ORAL | Status: DC

## 2018-02-15 MED ORDER — VANCOMYCIN HCL IN DEXTROSE 1-5 GM/200ML-% IV SOLN
1000.0000 mg | Freq: Once | INTRAVENOUS | Status: AC
  Administered 2018-02-15: 1000 mg via INTRAVENOUS
  Filled 2018-02-15: qty 200

## 2018-02-15 MED ORDER — LORAZEPAM 2 MG/ML IJ SOLN
2.0000 mg | Freq: Once | INTRAMUSCULAR | Status: AC
  Administered 2018-02-15: 2 mg via INTRAVENOUS

## 2018-02-15 MED ORDER — PROPOFOL 1000 MG/100ML IV EMUL
INTRAVENOUS | Status: AC
  Administered 2018-02-15: 45.027 ug/kg/min via INTRAVENOUS
  Filled 2018-02-15: qty 100

## 2018-02-15 MED ORDER — MORPHINE SULFATE (PF) 4 MG/ML IV SOLN
4.0000 mg | Freq: Once | INTRAVENOUS | Status: AC
  Administered 2018-02-15: 4 mg via INTRAVENOUS

## 2018-02-15 MED ORDER — CARVEDILOL 6.25 MG PO TABS
6.2500 mg | ORAL_TABLET | Freq: Every day | ORAL | Status: DC
  Administered 2018-02-15: 6.25 mg via ORAL
  Filled 2018-02-15: qty 1

## 2018-02-15 MED ORDER — ORAL CARE MOUTH RINSE
15.0000 mL | OROMUCOSAL | Status: DC
  Administered 2018-02-15 (×2): 15 mL via OROMUCOSAL

## 2018-02-15 MED ORDER — PROPOFOL 1000 MG/100ML IV EMUL
5.0000 ug/kg/min | Freq: Once | INTRAVENOUS | Status: AC
  Administered 2018-02-15: 20 ug/kg/min via INTRAVENOUS

## 2018-02-15 MED ORDER — LORAZEPAM 2 MG/ML IJ SOLN
INTRAMUSCULAR | Status: AC
  Administered 2018-02-15: 2 mg via INTRAVENOUS
  Filled 2018-02-15: qty 1

## 2018-02-15 MED ORDER — FENTANYL BOLUS VIA INFUSION
25.0000 ug | INTRAVENOUS | Status: DC | PRN
  Filled 2018-02-15: qty 25

## 2018-02-15 MED ORDER — ATORVASTATIN CALCIUM 20 MG PO TABS
40.0000 mg | ORAL_TABLET | ORAL | Status: DC
  Administered 2018-02-15: 40 mg via ORAL
  Filled 2018-02-15: qty 2

## 2018-02-15 MED ORDER — SODIUM CHLORIDE 0.9 % IV BOLUS
1000.0000 mL | Freq: Once | INTRAVENOUS | Status: AC
  Administered 2018-02-15: 1000 mL via INTRAVENOUS

## 2018-02-17 LAB — BLOOD GAS, ARTERIAL
ALLENS TEST (PASS/FAIL): POSITIVE — AB
Acid-base deficit: 2.5 mmol/L — ABNORMAL HIGH (ref 0.0–2.0)
Bicarbonate: 25.1 mmol/L (ref 20.0–28.0)
FIO2: 0.4
MECHVT: 450 mL
O2 SAT: 93.9 %
PEEP/CPAP: 5 cmH2O
PO2 ART: 81 mmHg — AB (ref 83.0–108.0)
Patient temperature: 37
RATE: 20 resp/min
pCO2 arterial: 56 mmHg — ABNORMAL HIGH (ref 32.0–48.0)
pH, Arterial: 7.26 — ABNORMAL LOW (ref 7.350–7.450)

## 2018-02-20 LAB — CULTURE, BLOOD (ROUTINE X 2)
CULTURE: NO GROWTH
Special Requests: ADEQUATE

## 2018-02-20 LAB — CULTURE, BLOOD (SINGLE): Culture: NO GROWTH

## 2018-02-21 ENCOUNTER — Telehealth: Payer: Self-pay

## 2018-02-21 NOTE — Telephone Encounter (Signed)
Faxed to [facility] per their request. Nothing further needed.

## 2018-02-21 NOTE — Telephone Encounter (Signed)
Death cert given to DS.

## 2018-02-21 NOTE — Telephone Encounter (Signed)
Received Death Certificate from [facility] . Placed in nurse box

## 2018-02-25 NOTE — ED Provider Notes (Signed)
[facility] Emergency Department Provider Note  ____________________________________________  Time seen: Approximately 9:27 AM  I have reviewed the triage vital signs and the nursing notes.   HISTORY  Chief Complaint Shortness of Breath  Level 5 caveat:  Portions of the history and physical were unable to be obtained due to severe respiratory distress   HPI Lacosta Pham is a 77 y.o. female with a history of Pham, Angela Pham, Angela Pham, Angela Pham, hyperlipidemia, OSA on CPAP, diabetes who presents with respiratory failure.  According to EMS patient has been declining since yesterday progressively worsening respiratory distress.  Has been using her inhalers.  When EMS arrived the patient was hypoxic to 73% and was placed on CPAP.  In route patient's mental status declined significantly.  Patient arrives in severe respiratory distress, pale and diaphoretic, unable to provide any history.  Patient was recently admitted last week and intubated for Angela Pham exacerbation in the setting of pneumonia.  She continues to be on antibiotics.  Past Medical History:  Diagnosis Date  . Asthma   . 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 TTE 02/2017: EF 45-50%, basal amd midanteroseptal HK b. 05/2017: echo showing EF of 35-40%, mild AI, moderate MR, and mildly dilated Angela.   Marland Kitchen Coronary artery disease, non-occlusive    a. LHC 02/2017 normal coronary arteries  . Hyperlipidemia   . Angela Pham   . Morbid obesity ([place])   . OSA (obstructive sleep apnea)    a. On CPAP w/ O2 bleed in.  . Pulmonary Angela Pham ([place])   . Type II diabetes mellitus [facility])     Patient Active Problem List   Diagnosis Date Noted  . HCAP (healthcare-associated pneumonia) 02/20/18  . Pham (congestive heart failure) ([place]) 11/11/2017  . Anemia 09/26/2017  . Monoclonal gammopathy 09/26/2017  . Acute respiratory failure ([place]) 08/24/2017  . Acute URI 08/20/2017  . Acute  respiratory failure with hypoxia ([place]) 08/03/2017  . DNR (do not resuscitate) 07/02/2017  . Respiratory failure ([place]) 06/27/2017  . Chronic diastolic heart failure ([place]) 06/24/2017  . Asthma 06/24/2017  . Diabetes ([place]) 06/24/2017  . Essential Angela Pham 06/23/2017  . Hyperlipidemia, unspecified 06/23/2017  . CKD (chronic kidney disease) stage 3, GFR 30-59 ml/min (HCC) 06/23/2017  . Pressure injury of skin 06/12/2017  . Angela Pham exacerbation ([place]) 05/28/2017  . SOB (shortness of breath)   . Chronic combined systolic and diastolic heart failure ([place]) 03/07/2017    Past Surgical History:  Procedure Laterality Date  . c-section    . RIGHT/LEFT HEART CATH AND CORONARY ANGIOGRAPHY N/A 03/09/2017   Procedure: Right/Left Heart Cath and Coronary Angiography;  Surgeon: Minna Merritts, MD;  Location: [place] CV LAB;  Service: Cardiovascular;  Laterality: N/A;    Prior to Admission medications   Medication Sig Start Date End Date Taking? Authorizing Provider  arformoterol (BROVANA) 15 MCG/2ML NEBU Take 15 mcg by nebulization 2 (two) times daily.   Yes [provider]  aspirin EC 81 MG EC tablet Take 1 tablet (81 mg total) by mouth daily. 03/12/17  Yes Mody, Ulice Bold, MD  atorvastatin (LIPITOR) 40 MG tablet Take 40 mg by mouth every morning.    Yes [provider]  budesonide (PULMICORT) 0.25 MG/2ML nebulizer solution Take 2 mLs (0.25 mg total) by nebulization 2 (two) times daily. DX: Angela Pham J44.9 10/06/17  Yes Wilhelmina Mcardle, MD  carvedilol (COREG) 6.25 MG tablet Take 1 tablet (6.25 mg total) by mouth daily. 11/12/17  Yes  [name], MD  cefdinir (OMNICEF) 300 MG capsule Take 1 capsule (300 mg total) by mouth daily. 02/11/18  Yes Fritzi Mandes, MD  guaiFENesin-dextromethorphan [facility] DM) 100-10 MG/5ML syrup Take 5 mLs by mouth every 4 (four) hours as needed for cough. 01/21/18  Yes Salary, Avel Peace, MD  levocetirizine (XYZAL) 5 MG tablet Take 5 mg by mouth daily.  03/18/17   Yes [provider]  mirtazapine (REMERON) 45 MG tablet Take 1 tablet by mouth at bedtime.   Yes [provider]  montelukast (SINGULAIR) 10 MG tablet Take 10 mg by mouth at bedtime.   Yes [provider]  Multiple Vitamins-Minerals (EYE VITAMINS) CAPS Take 1 tablet by mouth 2 (two) times daily.    Yes [provider]  salmeterol (SEREVENT DISKUS) 50 MCG/DOSE diskus inhaler Inhale 1 puff into the lungs 2 (two) times daily. 10/07/17 10/07/18 Yes Wilhelmina Mcardle, MD  senna-docusate (SENOKOT-S) 8.6-50 MG tablet Take 2 tablets by mouth 2 (two) times daily. 07/04/17  Yes Sudini, Alveta Heimlich, MD  tiotropium (SPIRIVA HANDIHALER) 18 MCG inhalation capsule Place 1 capsule (18 mcg total) into inhaler and inhale daily. 07/04/17 07/04/18 Yes Sudini, Alveta Heimlich, MD  torsemide (DEMADEX) 20 MG tablet Take 1 tablet (20 mg total) by mouth daily. 02/11/18  Yes Fritzi Mandes, MD    Allergies Ace inhibitors and Shellfish allergy  Family History  Problem Relation Age of Onset  . Angela Pham Father   . Colon cancer Sister   . Leukemia Brother   . Throat cancer Brother   . Cervical cancer Sister     Social History Social History   Tobacco Use  . Smoking status: Former Smoker    Packs/day: 0.30    Years: 20.00    Pack years: 6.00    Types: Cigarettes  . Smokeless tobacco: Never Used  Substance Use Topics  . Alcohol use: No  . Drug use: No    Review of Systems  Respiratory: + respiratory distress  ____________________________________________   PHYSICAL EXAM:  VITAL SIGNS: ED Triage Vitals  Enc Vitals Group     BP 03/17/18 0745 (!) 123/98     Pulse Rate Mar 17, 2018 0745 (!) 127     Resp 03-17-18 0745 (!) 32     Temp Mar 17, 2018 0745 98.6 F (37 C)     Temp Source 03-17-2018 0745 Oral     SpO2 17-Mar-2018 0745 94 %     Weight 03/17/18 0807 164 lb (74.4 kg)     Height 03-17-2018 0807 4\' 11"  (1.499 m)     Head Circumference --      Peak Flow --      Pain Score 17-Mar-2018 0807 0     Pain  Loc --      Pain Edu? --      Excl. in [place]? --     Constitutional: Awake, unable to answer questions due to severe respiratory distress HEENT:      Head: Normocephalic and atraumatic.         Eyes: Conjunctivae are normal. Sclera is non-icteric.       Mouth/Throat: Mucous membranes are dry.       Neck: Supple with no signs of meningismus. Cardiovascular: Tachycardic with regular rhythm. No murmurs, gallops, or rubs.  Respiratory: Severe respiratory distress, tachypnea, severely diminished air movement bilaterally  Gastrointestinal: Soft, non tender, and non distended with positive bowel sounds. No rebound or guarding. Musculoskeletal: No edema or cyanosis  neurologic: GCS 10 Skin: Skin is pale and diaphoretic  ____________________________________________   LABS (all labs ordered are listed, but only abnormal results are displayed)  Labs Reviewed  BASIC METABOLIC PANEL - Abnormal; Notable for the following components:      Result Value   Glucose, Bld 290 (*)    BUN 68 (*)    Creatinine, Ser 2.24 (*)    Calcium 8.8 (*)    GFR calc non Af Amer 20 (*)    GFR calc Af Amer 24 (*)    All other components within normal limits  CBC - Abnormal; Notable for the following components:   WBC 20.0 (*)    RBC 3.43 (*)    Hemoglobin 10.5 (*)    HCT 32.2 (*)    RDW 16.2 (*)    All other components within normal limits  TROPONIN I - Abnormal; Notable for the following components:   Troponin I 0.06 (*)    All other components within normal limits  LACTIC ACID, PLASMA - Abnormal; Notable for the following components:   Lactic Acid, Venous 3.4 (*)    All other components within normal limits  BLOOD GAS, ARTERIAL - Abnormal; Notable for the following components:   pH, Arterial 7.20 (*)    pCO2 arterial 71 (*)    pO2, Arterial 125 (*)    Allens test (pass/fail) POSITIVE (*)    All other components within normal limits  URINALYSIS, ROUTINE W REFLEX MICROSCOPIC - Abnormal; Notable for the  following components:   Color, Urine YELLOW (*)    APPearance CLEAR (*)    Glucose, UA 50 (*)    All other components within normal limits  GLUCOSE, CAPILLARY - Abnormal; Notable for the following components:   Glucose-Capillary 209 (*)    All other components within normal limits  MRSA PCR SCREENING  CULTURE, BLOOD (ROUTINE X 2)  CULTURE, EXPECTORATED SPUTUM-ASSESSMENT  CULTURE, BLOOD (SINGLE) W REFLEX TO ID PANEL  LACTIC ACID, PLASMA  PROCALCITONIN   ____________________________________________  EKG   ED ECG REPORT I, Rudene Re, the attending physician, personally viewed and interpreted this ECG.  Sinus tachycardia, rate of 134, left bundle branch block, right axis deviation, no ST elevations or depressions.  Unchanged from prior. ____________________________________________  RADIOLOGY  I have personally reviewed the images performed during this visit and I agree with the Radiologist's read.   Interpretation by Radiologist:  Dg Chest Portable 1 View  Result Date: 03/03/2018 CLINICAL DATA:  Hypoxia EXAM: PORTABLE CHEST 1 VIEW COMPARISON:  Feb 09, 2018 FINDINGS: Endotracheal tube tip is 3.2 cm above the carina. No pneumothorax. There is airspace consolidation in the right lower lobe and left upper lobe toward the apex consistent with multifocal pneumonia. Lungs elsewhere clear. Heart is mildly enlarged with pulmonary venous Angela Pham. No adenopathy evident. No bone lesions. IMPRESSION: Endotracheal tube as described without pneumothorax. Left upper lobe and right lower lobe consolidation consistent with multifocal pneumonia. There is a degree of underlying pulmonary vascular congestion. Electronically Signed   By: Lowella Grip III M.D.   On: 03/03/18 08:27   Dg Abd Portable 1 View  Result Date: 03-03-2018 CLINICAL DATA:  Gastric tube placement EXAM: PORTABLE ABDOMEN - 1 VIEW COMPARISON:  02/08/2018. FINDINGS: NG tube in the body the stomach. Gas is present  stomach. Nonobstructive bowel gas pattern. Bibasilar airspace disease right greater than left. IMPRESSION: NG in the body the stomach. Electronically Signed   By: Franchot Gallo M.D.   On: 03/03/2018 09:06     ____________________________________________   PROCEDURES  Procedure(s) performed: yes  Procedure Name: Intubation Date/Time: 03/09/2018 9:31 AM Performed by: Rudene Re, MD Pre-anesthesia Checklist: Patient identified, Patient being monitored, Emergency Drugs available, Timeout performed and Suction available Oxygen Delivery Method: Ambu bag Preoxygenation: Pre-oxygenation with 100% oxygen Induction Type: Rapid sequence Ventilation: Mask ventilation without difficulty Laryngoscope Size: Glidescope and 3 Grade View: Grade I Tube size: 7.5 mm Number of attempts: 1 Airway Equipment and Method: Video-laryngoscopy Placement Confirmation: ETT inserted through vocal cords under direct vision,  CO2 detector and Breath sounds checked- equal and bilateral Secured at: 23 cm Tube secured with: ETT holder Dental Injury: Teeth and Oropharynx as per pre-operative assessment       Critical Care performed: yes  CRITICAL CARE Performed by: Rudene Re  ?  Total critical care time: 40 min  Critical care time was exclusive of separately billable procedures and treating other patients.  Critical care was necessary to treat or prevent imminent or life-threatening deterioration.  Critical care was time spent personally by me on the following activities: development of treatment plan with patient and/or surrogate as well as nursing, discussions with consultants, evaluation of patient's response to treatment, examination of patient, obtaining history from patient or surrogate, ordering and performing treatments and interventions, ordering and review of laboratory studies, ordering and review of radiographic studies, pulse oximetry and re-evaluation of patient's  condition.  ____________________________________________   INITIAL IMPRESSION / ASSESSMENT AND PLAN / ED COURSE   77 y.o. female with a history of Pham, Angela Pham, Angela Pham, Angela Pham, hyperlipidemia, OSA on CPAP, diabetes who presents with respiratory failure.  Patient initially with GCS of 10 and she was tried on BiPAP for short period of time but started to become hypoxic.  She was ventilated with a BVM and sats were brought up to 100%.  She was then intubated per procedure note above with etomidate and succinylcholine.  She was started on DuoNebs, Solu-Medrol, magnesium.  X-ray showing worsening multifocal pneumonia.  She was started on cefepime and vancomycin.  Patient meets criteria for sepsis with a white count of 20K and elevated lactate.  She was given a liter of fluid.  She is being sedated on propofol.  Discussed with the hospitalist for admission to the ICU.      As part of my medical decision making, I reviewed the following data within the [place] notes reviewed and incorporated, Labs reviewed , EKG interpreted , Old EKG reviewed, Old chart reviewed, Radiograph reviewed , Discussed with admitting physician , Notes from prior ED visits and [place] Controlled Substance Database    Pertinent labs & imaging results that were available during my care of the patient were reviewed by me and considered in my medical decision making (see chart for details).    ____________________________________________   FINAL CLINICAL IMPRESSION(S) / ED DIAGNOSES  Final diagnoses:  Acute respiratory failure with hypoxia and hypercapnia (HCC)  Sepsis, due to unspecified organism [facility])      NEW MEDICATIONS STARTED DURING THIS VISIT:  ED Discharge Orders    None       Note:  This document was prepared using Dragon voice recognition software and may include unintentional dictation errors.    Alfred Levins, [place], MD 2018-03-09 [phone]

## 2018-02-25 NOTE — Death Summary Note (Signed)
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[place]) Secondary Diagnoses (including complications and co-morbidities):  Active Problems:   COPD exacerbation (HCC)   Acute respiratory failure with hypoxia (HCC)   Acute respiratory failure (HCC)   CHF (congestive heart failure) (HCC)   HCAP (healthcare-associated pneumonia)   OSA on CPAP    Type II Diabetes Mellitus    Chronic combined systolic and diastolic CHF     Asthma    Morbid Obesity    Pulmonary Hypertension   Acute Renal Failure  Brief Hospital Course (including significant findings, care, treatment, and services provided and events leading to death)  Angela Pham is a 76 y.o. year old female who presented to [facility] ER via EMS on Feb 26, 2018 with acute on chronic hypoxic hypercapnic respiratory failure secondary to severe end stage COPD.  She was initially placed on CPAP en route to the ER, however due to decline in respiratory status and mentation she required mechanical intubation in the ER.  She was subsequently admitted to ICU for further workup and treatment.  After further discussion with pts HCPOA (pts niece) code status clarified, and during previous hospitalization pt changed her code status to DO NOT RESUSCITATE/DO NOT INTUBATE.  Therefore, pts family decided to transition pt to comfort care only.  Pt terminally extubated on 26-Feb-2018 with family at bedside.  She expired on 02-26-2018 at 2109/01/03.    Pertinent Labs and Studies  Significant Diagnostic Studies Dg Chest 2 View  Result Date: 01/20/2018 CLINICAL DATA:  Shortness of breath EXAM: CHEST - 2 VIEW COMPARISON:  January 18, 2018  FINDINGS: There is a small left pleural effusion with mild left base atelectasis. There is no edema or consolidation. There is cardiomegaly with mild pulmonary venous hypertension. No adenopathy. No evident bone lesions. There is mild degenerative change in the thoracic spine. IMPRESSION: Pulmonary vascular congestion. No frank edema or consolidation. There is a small left pleural effusion with left base atelectasis. Electronically Signed   By: Lowella Grip III M.D.   On: 01/20/2018 08:49   Dg Chest Portable 1 View  Result Date: Feb 26, 2018 CLINICAL DATA:  Hypoxia EXAM: PORTABLE CHEST 1 VIEW COMPARISON:  Feb 09, 2018 FINDINGS: Endotracheal tube tip is 3.2 cm above the carina. No pneumothorax. There is airspace consolidation in the right lower lobe and left upper lobe toward the apex consistent with multifocal pneumonia. Lungs elsewhere clear. Heart is mildly enlarged with pulmonary venous hypertension. No adenopathy evident. No bone lesions. IMPRESSION: Endotracheal tube as described without pneumothorax. Left upper lobe and right lower lobe consolidation consistent with multifocal pneumonia. There is a degree of underlying pulmonary vascular congestion. Electronically Signed   By: Lowella Grip III M.D.   On: 02/26/18 08:27   Dg Chest Port 1 View  Result Date: 02/09/2018 CLINICAL DATA:  77 year old female with respiratory distress, intubated. EXAM: PORTABLE CHEST 1 VIEW COMPARISON:  02/08/2018 and earlier. FINDINGS: Portable AP semi upright view at 0402 hours. Endotracheal tube tip projects within 1 centimeter of the carina and could be retracted up to 2 centimeters for more optimal placement. Enteric tube in place and courses to the abdomen, tip not included.  Stable cardiac size and mediastinal contours. Increased confluent in veiling opacity behind the heart obscuring the medial portions of the diaphragm. At the same time confluent right lower lung airspace opacity about the hilum has regressed.  No pneumothorax or pulmonary edema. Paucity of bowel gas in the upper abdomen. IMPRESSION: 1. Endotracheal tube tip projects within 1 cm of the carina. Retract 1-2 cm for optimal placement. 2. Enteric tube courses to the abdomen, tip not included. 3. Regression of right lower perihilar airspace disease since yesterday, but increasing retrocardiac opacity favored to be bilateral lower lobe atelectasis. Electronically Signed   By: Genevie Ann M.D.   On: 02/09/2018 09:53   Dg Chest Port 1 View  Result Date: 02/08/2018 CLINICAL DATA:  Respiratory distress. EXAM: PORTABLE CHEST 1 VIEW COMPARISON:  01/20/2018 FINDINGS: Consolidation noted in the right mid and lower lung concerning for pneumonia. Endotracheal tube is 3 cm above the carina. No definite confluent opacity on the left. Heart is borderline in size. Hyperinflation/COPD. IMPRESSION: Consolidation in the right mid and lower lung concerning for pneumonia. Underlying COPD. Electronically Signed   By: Rolm Baptise M.D.   On: 02/08/2018 10:10   Dg Chest Portable 1 View  Result Date: 01/18/2018 CLINICAL DATA:  Respiratory distress EXAM: PORTABLE CHEST 1 VIEW COMPARISON:  January 16, 2018 FINDINGS: Lungs are somewhat hyperexpanded. There is slight bibasilar interstitial edema. There is mild atelectasis in the right base with minimal right pleural effusion. Lungs elsewhere are clear. Heart is mildly enlarged with pulmonary venous hypertension. No adenopathy. No bone lesions. IMPRESSION: Pulmonary vascular congestion with slight bibasilar interstitial edema. Minimal right pleural effusion with mild right base atelectasis. No consolidation. No adenopathy evident. Electronically Signed   By: Lowella Grip III M.D.   On: 01/18/2018 08:01   Dg Abd Portable 1 View  Result Date: March 16, 2018 CLINICAL DATA:  Gastric tube placement EXAM: PORTABLE ABDOMEN - 1 VIEW COMPARISON:  02/08/2018. FINDINGS: NG tube in the body the stomach. Gas is present stomach. Nonobstructive  bowel gas pattern. Bibasilar airspace disease right greater than left. IMPRESSION: NG in the body the stomach. Electronically Signed   By: Franchot Gallo M.D.   On: 2018/03/16 09:06   Dg Abd Portable 1v  Result Date: 02/08/2018 CLINICAL DATA:  77 year old female enteric tube placement. EXAM: PORTABLE ABDOMEN - 1 VIEW COMPARISON:  Portable chest 0917 hours today. FINDINGS: Portable AP view at 1536 hours. Enteric tube courses to the midline of the upper abdomen. The side hole is at the level of the gastric body. Grossly stable lung bases. Normal visible bowel gas pattern. No acute osseous abnormality identified. IMPRESSION: Enteric tube courses to the stomach, side hole at the level of the gastric body. Electronically Signed   By: Genevie Ann M.D.   On: 02/08/2018 16:17    Microbiology Recent Results (from the past 240 hour(s))  Blood culture (routine x 2)     Status: Abnormal   Collection Time: 02/08/18 10:14 AM  Result Value Ref Range Status   Specimen Description   Final    BLOOD BLOOD LEFT FOREARM Performed at [facility], [address] [place], [place] [address]    Special Requests   Final    BOTTLES DRAWN AEROBIC AND ANAEROBIC Blood Culture adequate volume Performed at [facility], [address] [place], [place] [address]    Culture  Setup Time   Final    GRAM POSITIVE COCCI AEROBIC BOTTLE ONLY CRITICAL RESULT CALLED TO, READ BACK BY AND VERIFIED  WITH: MATT MCBANE AT 6720 02/09/18 SDR    Culture (A)  Final    STAPHYLOCOCCUS SPECIES (COAGULASE NEGATIVE) THE SIGNIFICANCE OF ISOLATING THIS ORGANISM FROM A SINGLE SET OF BLOOD CULTURES WHEN MULTIPLE SETS ARE DRAWN IS UNCERTAIN. PLEASE NOTIFY THE MICROBIOLOGY DEPARTMENT WITHIN ONE WEEK IF SPECIATION AND SENSITIVITIES ARE REQUIRED. Performed at [facility], [place] [address] [place], [place] [address]    Report Status 02/11/2018 FINAL  Final  Blood Culture ID Panel (Reflexed)     Status: Abnormal   Collection  Time: 02/08/18 10:14 AM  Result Value Ref Range Status   Enterococcus species NOT DETECTED NOT DETECTED Final   Listeria monocytogenes NOT DETECTED NOT DETECTED Final   Staphylococcus species DETECTED (A) NOT DETECTED Final    Comment: Methicillin (oxacillin) susceptible coagulase negative staphylococcus. Possible blood culture contaminant (unless isolated from more than one blood culture draw or clinical case suggests pathogenicity). No antibiotic treatment is indicated for blood  culture contaminants. CRITICAL RESULT CALLED TO, READ BACK BY AND VERIFIED WITH:  MATT MCBANE AT 6283 02/09/18 SDR    Staphylococcus aureus NOT DETECTED NOT DETECTED Final   Methicillin resistance NOT DETECTED NOT DETECTED Final   Streptococcus species NOT DETECTED NOT DETECTED Final   Streptococcus agalactiae NOT DETECTED NOT DETECTED Final   Streptococcus pneumoniae NOT DETECTED NOT DETECTED Final   Streptococcus pyogenes NOT DETECTED NOT DETECTED Final   Acinetobacter baumannii NOT DETECTED NOT DETECTED Final   Enterobacteriaceae species NOT DETECTED NOT DETECTED Final   Enterobacter cloacae complex NOT DETECTED NOT DETECTED Final   Escherichia coli NOT DETECTED NOT DETECTED Final   Klebsiella oxytoca NOT DETECTED NOT DETECTED Final   Klebsiella pneumoniae NOT DETECTED NOT DETECTED Final   Proteus species NOT DETECTED NOT DETECTED Final   Serratia marcescens NOT DETECTED NOT DETECTED Final   Haemophilus influenzae NOT DETECTED NOT DETECTED Final   Neisseria meningitidis NOT DETECTED NOT DETECTED Final   Pseudomonas aeruginosa NOT DETECTED NOT DETECTED Final   Candida albicans NOT DETECTED NOT DETECTED Final   Candida glabrata NOT DETECTED NOT DETECTED Final   Candida krusei NOT DETECTED NOT DETECTED Final   Candida parapsilosis NOT DETECTED NOT DETECTED Final   Candida tropicalis NOT DETECTED NOT DETECTED Final    Comment: Performed at [facility], [address] [place], [place] [address]   Culture, blood (routine x 2) Call MD if unable to obtain prior to antibiotics being given     Status: None   Collection Time: 02/08/18  1:46 PM  Result Value Ref Range Status   Specimen Description BLOOD BLOOD LEFT HAND  Final   Special Requests   Final    BOTTLES DRAWN AEROBIC AND ANAEROBIC Blood Culture results may not be optimal due to an inadequate volume of blood received in culture bottles   Culture   Final    NO GROWTH 5 DAYS Performed at [facility], [address] [place], [place] [address]    Report Status 02/13/2018 FINAL  Final  MRSA PCR Screening     Status: None   Collection Time: 02/08/18  2:07 PM  Result Value Ref Range Status   MRSA by PCR NEGATIVE NEGATIVE Final    Comment:        The GeneXpert MRSA Assay (FDA approved for NASAL specimens only), is one component of a comprehensive MRSA colonization surveillance program. It is not intended to diagnose MRSA infection nor to guide or monitor treatment for MRSA infections. Performed at [facility], [address] [place], [place] [address]   Culture, respiratory (NON-Expectorated)     Status: None   Collection Time: 02/08/18  5:39 PM  Result Value Ref Range Status   Specimen Description   Final    TRACHEAL ASPIRATE Performed at [facility], [address], [place], [place] [address]    Special Requests   Final    NONE Performed at [facility], [address] [place], [place] [address]    Gram Stain   Final    MODERATE WBC PRESENT, PREDOMINANTLY PMN FEW GRAM POSITIVE RODS RARE GRAM POSITIVE COCCI    Culture   Final    Consistent with normal respiratory flora. Performed at [facility], [place] [address], [place], [place] [address]    Report Status 02/10/2018 FINAL  Final  MRSA PCR Screening     Status: None   Collection Time: Mar 11, 2018  9:38 AM  Result Value Ref Range Status   MRSA by PCR NEGATIVE NEGATIVE Final    Comment:        The GeneXpert MRSA  Assay (FDA approved for NASAL specimens only), is one component of a comprehensive MRSA colonization surveillance program. It is not intended to diagnose MRSA infection nor to guide or monitor treatment for MRSA infections. Performed at [facility], [address] [place], [place] [address]     Lab Basic Metabolic Panel: Recent Labs  Lab 02/09/18 0504 02/10/18 0515 03/11/2018 0757  NA 142 143 138  K 3.6 3.9 4.6  CL 105 105 102  CO2 25 28 27   GLUCOSE 129* 120* 290*  BUN 65* 74* 68*  CREATININE 1.98* 2.26* 2.24*  CALCIUM 8.9 8.9 8.8*   Liver Function Tests: No results for input(s): AST, ALT, ALKPHOS, BILITOT, PROT, ALBUMIN in the last 168 hours. No results for input(s): LIPASE, AMYLASE in the last 168 hours. No results for input(s): AMMONIA in the last 168 hours. CBC: Recent Labs  Lab 02/09/18 0504 03/11/2018 0757  WBC 13.2* 20.0*  HGB 9.6* 10.5*  HCT 28.5* 32.2*  MCV 90.3 93.8  PLT 219 384   Cardiac Enzymes: Recent Labs  Lab 02/09/18 0039 11-Mar-2018 0757  TROPONINI 1.50* 0.06*   Sepsis Labs: Recent Labs  Lab 02/09/18 0504 02/10/18 0515 11-Mar-2018 0757 11-Mar-2018 1035  PROCALCITON 2.22 1.75 <0.10  --   WBC 13.2*  --  20.0*  --   LATICACIDVEN  --   --  3.4* 1.2    Procedures/Operations  Mechanical Intubation  Extubation  Marda Stalker, [place] Pager [phone] (please enter 7 digits) PCCM Consult Pager [phone] (please enter 7 digits)

## 2018-02-25 NOTE — Progress Notes (Signed)
   Feb 25, 2018 1500  Clinical Encounter Type  Visited With Patient and family together  Visit Type Follow-up (order regarding end of life)  Referral From Physician  Consult/Referral To [place] responded to order regarding end of life.  Chaplain engaged family (niece and great-nephew) in life review of patient.  Conversation regarding her love of travel, the [place], going out to eat.  They spoke of her as a woman of strong will who would "speak her mind."  Chaplain explored family thoughts around end of life.  Each shared stories of other loved ones' deaths and impact on now.  Each have told patient that they are ready for her to "go on" and they don't feel as though she is waiting for anyone else to arrive.  Both are committed to not wanting patient to suffer.  They expressed finding it difficult to watch her breathing.  Chaplain spoke of ongoing chaplain availability.  Family would appreciate on-call chaplain checking in with them.  Nephew expressed desire to get outside for awhile.  Chaplain escorted him to the healing garden.

## 2018-02-25 NOTE — H&P (Signed)
[facility] [place] NAME: Angela Pham    MR#:  [id number]  DATE OF BIRTH:  12-02-1940  DATE OF ADMISSION:  02-21-2018  PRIMARY CARE PHYSICIAN: Gareth Morgan, MD   REQUESTING/REFERRING PHYSICIAN: Alfred Levins  CHIEF COMPLAINT:   Chief Complaint  Patient presents with  . Shortness of Breath    HISTORY OF PRESENT ILLNESS: Angela Pham  is a 77 y.o. female with a known history of chronic combined CHF, CAD, hypertension, hyperlipidemia, obstructive sleep apnea, pulmonary hypertension, diabetes, COPD- and recurrent multiple admissions in last few months. She was last admitted last week, with intubation, extubated and related care meeting took place in family and patient agreed on DO NOT RESUSCITATE. She was discharged to home with home health and pace program follow-up. During last admission she was treated for pneumonia and was discharged on oral antibiotics. She was following every day with pace program, where she went yesterday and was able to do her therapy. After that in the afternoon and evening she was feeling worsening her shortness of breath. Had to use her nebulizer therapy with some help. Today morning patient's niece left for work, and later patient called her that she is feeling short of breath and she is going to call ambulance. On arrival to ER she was noted to be hypoxic, lethargic, so ER physician intubated the patient in absence of any advanced directly conveyed to them. Patient is on ventilatory support when I saw her, her knees is the source of information on phone.  PAST MEDICAL HISTORY:   Past Medical History:  Diagnosis Date  . Asthma   . Chronic combined systolic and diastolic CHF (congestive heart failure) ([place])    a. TTE 02/2017: EF 45-50%, basal amd midanteroseptal HK b. 05/2017: echo showing EF of 35-40%, mild AI, moderate MR, and mildly dilated LA.   [place] Kitchen Coronary artery disease, non-occlusive    a. LHC 02/2017 normal coronary  arteries  . Hyperlipidemia   . Hypertension   . Morbid obesity ([place])   . OSA (obstructive sleep apnea)    a. On CPAP w/ O2 bleed in.  . Pulmonary hypertension ([place])   . Type II diabetes mellitus ([place])     PAST SURGICAL HISTORY:  Past Surgical History:  Procedure Laterality Date  . c-section    . RIGHT/LEFT HEART CATH AND CORONARY ANGIOGRAPHY N/A 03/09/2017   Procedure: Right/Left Heart Cath and Coronary Angiography;  Surgeon: Minna Merritts, MD;  Location: [facility];  Service: Cardiovascular;  Laterality: N/A;    SOCIAL HISTORY:  Social History   Tobacco Use  . Smoking status: Former Smoker    Packs/day: 0.30    Years: 20.00    Pack years: 6.00    Types: Cigarettes  . Smokeless tobacco: Never Used  Substance Use Topics  . Alcohol use: No    FAMILY HISTORY:  Family History  Problem Relation Age of Onset  . CAD Father   . Colon cancer Sister   . Leukemia Brother   . Throat cancer Brother   . Cervical cancer Sister     DRUG ALLERGIES:  Allergies  Allergen Reactions  . Ace Inhibitors Anaphylaxis    Tongue swelling  . Shellfish Allergy Hives and Swelling    Swelling around face and mouth     REVIEW OF SYSTEMS:   Pt is on vent support , can not give ROS.  MEDICATIONS AT HOME:  Prior to Admission medications   Medication Sig  Start Date End Date Taking? Authorizing Provider  arformoterol (BROVANA) 15 MCG/2ML NEBU Take 15 mcg by nebulization 2 (two) times daily.   Yes [provider]  aspirin EC 81 MG EC tablet Take 1 tablet (81 mg total) by mouth daily. 03/12/17  Yes Mody, Ulice Bold, MD  atorvastatin (LIPITOR) 40 MG tablet Take 40 mg by mouth every morning.    Yes [provider]  budesonide (PULMICORT) 0.25 MG/2ML nebulizer solution Take 2 mLs (0.25 mg total) by nebulization 2 (two) times daily. DX: COPD J44.9 10/06/17  Yes Wilhelmina Mcardle, MD  carvedilol (COREG) 6.25 MG tablet Take 1 tablet (6.25 mg total) by mouth daily. 11/12/17  Yes  Mody, Ulice Bold, MD  cefdinir (OMNICEF) 300 MG capsule Take 1 capsule (300 mg total) by mouth daily. 02/11/18  Yes Fritzi Mandes, MD  guaiFENesin-dextromethorphan [facility]) 100-10 MG/5ML syrup Take 5 mLs by mouth every 4 (four) hours as needed for cough. 01/21/18  Yes Salary, Avel Peace, MD  levocetirizine (XYZAL) 5 MG tablet Take 5 mg by mouth daily.  03/18/17  Yes [provider]  mirtazapine (REMERON) 45 MG tablet Take 1 tablet by mouth at bedtime.   Yes [provider]  montelukast (SINGULAIR) 10 MG tablet Take 10 mg by mouth at bedtime.   Yes [provider]  Multiple Vitamins-Minerals (EYE VITAMINS) CAPS Take 1 tablet by mouth 2 (two) times daily.    Yes [provider]  salmeterol (SEREVENT DISKUS) 50 MCG/DOSE diskus inhaler Inhale 1 puff into the lungs 2 (two) times daily. 10/07/17 10/07/18 Yes Wilhelmina Mcardle, MD  senna-docusate (SENOKOT-S) 8.6-50 MG tablet Take 2 tablets by mouth 2 (two) times daily. 07/04/17  Yes Sudini, Alveta Heimlich, MD  tiotropium (SPIRIVA HANDIHALER) 18 MCG inhalation capsule Place 1 capsule (18 mcg total) into inhaler and inhale daily. 07/04/17 07/04/18 Yes Sudini, Alveta Heimlich, MD  torsemide (DEMADEX) 20 MG tablet Take 1 tablet (20 mg total) by mouth daily. 02/11/18  Yes Fritzi Mandes, MD      PHYSICAL EXAMINATION:   VITAL SIGNS: Blood pressure 99/85, pulse (!) 104, temperature 97.7 F (36.5 C), temperature source Axillary, resp. rate 20, height 5' (1.524 m), weight 78.5 kg (173 lb 1 oz), SpO2 99 %.  GENERAL:  77 y.o.-year-old patient lying in the bed with no acute distress.  EYES: Pupils equal, round, reactive to light and accommodation. No scleral icterus. Extraocular muscles intact.  HEENT: Head atraumatic, normocephalic. Oropharynx and nasopharynx clear.  NECK:  Supple, no jugular venous distention. No thyroid enlargement, no tenderness.  LUNGS: decreased breath sounds bilaterally, b/l wheezing, no crepitation. Vent support, on  ETT. CARDIOVASCULAR: S1, S2 normal. No murmurs, rubs, or gallops.  ABDOMEN: Soft, nontender, nondistended. Bowel sounds present. No organomegaly or mass.  EXTREMITIES: No pedal edema, cyanosis, or clubbing.  NEUROLOGIC: Sedated, on vent support.  PSYCHIATRIC: sedated on vent.  SKIN: No obvious rash, lesion, or ulcer.   LABORATORY PANEL:   CBC Recent Labs  Lab 02/09/18 0504 2018/02/28 0757  WBC 13.2* 20.0*  HGB 9.6* 10.5*  HCT 28.5* 32.2*  PLT 219 384  MCV 90.3 93.8  MCH 30.2 30.5  MCHC 33.5 32.5  RDW 16.0* 16.2*   ------------------------------------------------------------------------------------------------------------------  Chemistries  Recent Labs  Lab 02/09/18 0504 02/10/18 0515 02/28/2018 0757  NA 142 143 138  K 3.6 3.9 4.6  CL 105 105 102  CO2 25 28 27   GLUCOSE 129* 120* 290*  BUN 65* 74* 68*  CREATININE 1.98* 2.26* 2.24*  CALCIUM 8.9 8.9 8.8*   ------------------------------------------------------------------------------------------------------------------  estimated creatinine clearance is 20.4 mL/min (A) (by C-G formula based on SCr of 2.24 mg/dL (H)). ------------------------------------------------------------------------------------------------------------------ No results for input(s): TSH, T4TOTAL, T3FREE, THYROIDAB in the last 72 hours.  Invalid input(s): FREET3   Coagulation profile No results for input(s): INR, PROTIME in the last 168 hours. ------------------------------------------------------------------------------------------------------------------- No results for input(s): DDIMER in the last 72 hours. -------------------------------------------------------------------------------------------------------------------  Cardiac Enzymes Recent Labs  Lab 02/08/18 1926 02/09/18 0039 03-11-18 0757  TROPONINI 1.80* 1.50* 0.06*    ------------------------------------------------------------------------------------------------------------------ Invalid input(s): POCBNP  ---------------------------------------------------------------------------------------------------------------  Urinalysis    Component Value Date/Time   COLORURINE YELLOW (A) Mar 11, 2018 0839   APPEARANCEUR CLEAR (A) 11-Mar-2018 0839   LABSPEC 1.011 03-11-2018 0839   PHURINE 5.0 March 11, 2018 0839   GLUCOSEU 50 (A) 2018-03-11 0839   HGBUR NEGATIVE 11-Mar-2018 0839   BILIRUBINUR NEGATIVE 03/11/18 0839   KETONESUR NEGATIVE 2018/03/11 0839   PROTEINUR NEGATIVE 03/11/18 0839   NITRITE NEGATIVE Mar 11, 2018 0839   LEUKOCYTESUR NEGATIVE 03/11/2018 0839     RADIOLOGY: Dg Chest Portable 1 View  Result Date: 03-11-18 CLINICAL DATA:  Hypoxia EXAM: PORTABLE CHEST 1 VIEW COMPARISON:  Feb 09, 2018 FINDINGS: Endotracheal tube tip is 3.2 cm above the carina. No pneumothorax. There is airspace consolidation in the right lower lobe and left upper lobe toward the apex consistent with multifocal pneumonia. Lungs elsewhere clear. Heart is mildly enlarged with pulmonary venous hypertension. No adenopathy evident. No bone lesions. IMPRESSION: Endotracheal tube as described without pneumothorax. Left upper lobe and right lower lobe consolidation consistent with multifocal pneumonia. There is a degree of underlying pulmonary vascular congestion. Electronically Signed   By: Lowella Grip III M.D.   On: 2018-03-11 08:27   Dg Abd Portable 1 View  Result Date: 11-Mar-2018 CLINICAL DATA:  Gastric tube placement EXAM: PORTABLE ABDOMEN - 1 VIEW COMPARISON:  02/08/2018. FINDINGS: NG tube in the body the stomach. Gas is present stomach. Nonobstructive bowel gas pattern. Bibasilar airspace disease right greater than left. IMPRESSION: NG in the body the stomach. Electronically Signed   By: Franchot Gallo M.D.   On: Mar 11, 2018 09:06    EKG: Orders placed or performed during  the hospital encounter of 2018/03/11  . ED EKG within 10 minutes  . ED EKG within 10 minutes  . EKG 12-Lead  . EKG 12-Lead    IMPRESSION AND PLAN:  * acute on chronic respiratory failure with hypoxia   Healthcare associated pneumonia   Altered mental status due to hypoxia and infection   COPD exacerbation    Intubated and on ventilatory support.   Keep n broad-spectrum antibiotics.   Nebulizer therapy   Patient have some pulmonary edema, but with borderline renal function I would like to monitor for now.    The patient had agreed to DO NOT RESUSCITATE/ DO NOT INTUBATE before last discharge.   I spoke to patient's niece, who is healthcare power of attorney, and she is going to come to hospital and speak to intensivist about the plan. Currently cont the same management,   Prognosis extremely poor.   All the records are reviewed and case discussed with ED provider. Management plans discussed with the patient, family and they are in agreement.  CODE STATUS: Full.    Code Status Orders  (From admission, onward)        Start     Ordered   2018-03-11 1444  DNR (Do not attempt resuscitation)  Continuous    Question Answer Comment  In the event of cardiac or respiratory ARREST Do not  call a "code blue"   In the event of cardiac or respiratory ARREST Do not perform Intubation, CPR, defibrillation or ACLS   In the event of cardiac or respiratory ARREST Use medication by any route, position, wound care, and other measures to relive pain and suffering. May use oxygen, suction and manual treatment of airway obstruction as needed for comfort.      2018-03-16 1443    Code Status History    Date Active Date Inactive Code Status Order ID Comments User Context   2018/03/16 1405 2018/03/16 1443 DNR [id number]  Flora Lipps, MD Inpatient   03/16/2018 1044 03/16/18 1405 Full Code [id number]  Vaughan Basta, MD Inpatient   02/09/2018 1058 02/11/2018 1832 DNR [id number]  Jimmy Footman, NP Inpatient   02/08/2018 1240 02/09/2018 1058 Full Code [id number]  Gorden Harms, MD Inpatient   02/08/2018 1240 02/08/2018 1240 Full Code [id number]  Gorden Harms, MD Inpatient   01/18/2018 1207 01/22/2018 1522 DNR [id number]  Demetrios Loll, MD ED   11/11/2017 0613 11/12/2017 1647 DNR [id number]  Saundra Shelling, MD Inpatient   08/24/2017 1245 08/26/2017 1907 DNR [id number]  Nicholes Mango, MD ED   08/20/2017 1249 08/23/2017 2142 DNR [id number]  Idelle Crouch, MD Inpatient   08/03/2017 1409 08/06/2017 1912 Full Code [id number]  Henreitta Leber, MD ED   06/27/2017 0429 07/04/2017 1659 DNR [id number]  Saundra Shelling, MD Inpatient   06/13/2017 0425 06/16/2017 1816 DNR [id number]  Mikael Spray, NP Inpatient   06/12/2017 0328 06/13/2017 0425 Full Code [id number]  [place], [place], DO Inpatient   06/12/2017 0200 06/12/2017 0328 DNR [id number]  Harvie Bridge, DO ED   05/29/2017 0249 06/02/2017 2123 DNR [id number]  Verlin Dike, RN Inpatient   05/28/2017 2259 05/29/2017 0249 Full Code [id number]  [place], [place], DO Inpatient   05/04/2017 1719 05/08/2017 1718 DNR [id number]  Loletha Grayer, MD ED   03/09/2017 1234 03/11/2017 2208 Full Code [id number]  Minna Merritts, MD Inpatient   03/07/2017 0512 03/09/2017 1234 Full Code [id number]  Saundra Shelling, MD Inpatient       TOTAL TIME TAKING CARE OF THIS PATIENT: 50 critical care minutes.    Vaughan Basta M.D on 03/16/2018   Between 7am to 6pm - Pager - [phone]  After 6pm go to [email or web address] - password [email or web address] [place] [facility]  Office  [phone]  CC: Primary care physician; Gareth Morgan, MD   Note: This dictation was prepared with Dragon dictation along with smaller phrase technology. Any transcriptional errors that result from this process are unintentional.

## 2018-02-25 NOTE — Progress Notes (Signed)
Pt was to be transferred to 1C. Report given and family went downstairs to wait for pt,. Pt expired before transfer was able to be accomplished. Pronounced at 01/06/04. Family called back to room.

## 2018-02-25 NOTE — Plan of Care (Signed)
Patient is currently on comfort care by CCM. Per CCM, consult canceled.

## 2018-02-25 NOTE — ED Triage Notes (Signed)
Arrives via [facility] for call out Respiratory Distress.  Initial room air sats 73%.  Patient started on Bipap in the field.  Has hisotry of COPD and recent history of intubation.

## 2018-02-25 NOTE — Progress Notes (Signed)
Patient with increased WOB and using accessory muscles to breathe  Patient will be made comfort care measures, patient and family witnessed and agree for comfort care.     Corrin Parker, M.D.  [facility]  Medical Director [place] Director [facility] Cardio-Pulmonary Department

## 2018-02-25 NOTE — Progress Notes (Signed)
Extubated to 3lnc without complications

## 2018-02-25 NOTE — Progress Notes (Signed)
Pt admitted today from ER on vent for acute SOB, pt recently here last week and discussions at that time re code status. A short time ago family came and stated that pt was awake and was requesting ET tube out. Dr Mortimer Fries went in to see and discussed options with patient and family ( niece) . Fa,ily and pt agreed that id we extubated pt we would not re intubate. ET tube removed , and pt quickly began with increased WOB, Hr to 130s, sats to mid 80s on 4 L of 02. MD aware, IV morphine push, IV Ativan and MS continuous drip started. Niece at bedside, offered chaplain, family refused. Offered to call other family members/support given

## 2018-02-25 NOTE — ED Notes (Signed)
Only able to obtain one set blood cultures at this time. Pt very difficult stick. Dr Alfred Levins

## 2018-02-25 NOTE — Progress Notes (Signed)
CRITICAL CARE NOTE  CC  Acute resp failure  HPI Critically ill 77 yo obese AAF with severe end stage COPD on full vent support Patient had several admission in the past for recurrent COPD exacerbation Intubated last week, was made DNR/DNI  Was intubated in ER for severe SOB and WOB and wheezing  Patient with severe end stage COPD Patient placed on Vent previously was made DNR/DNI Family at bedside Patient wants tube out       BP 99/85   Pulse (!) 104   Temp 97.7 F (36.5 C) (Axillary)   Resp 20   Ht 5' (1.524 m)   Wt 173 lb 1 oz (78.5 kg)   SpO2 99%   BMI 33.80 kg/m    REVIEW OF SYSTEMS  PATIENT IS UNABLE TO PROVIDE COMPLETE REVIEW OF SYSTEM S DUE TO SEVERE CRITICAL ILLNESS AND ENCEPHALOPATHY   PHYSICAL EXAMINATION:  GENERAL:critically ill appearing, +resp distress HEAD: Normocephalic, atraumatic.  EYES: Pupils equal, round, reactive to light.  No scleral icterus.  MOUTH: Moist mucosal membrane. NECK: Supple. No thyromegaly. No nodules. No JVD.  PULMONARY: +rhonchi, +wheezing CARDIOVASCULAR: S1 and S2. Regular rate and rhythm. No murmurs, rubs, or gallops.  GASTROINTESTINAL: Soft, nontender, -distended. No masses. Positive bowel sounds. No hepatosplenomegaly.  MUSCULOSKELETAL: No swelling, clubbing, or edema.  NEUROLOGIC:  GCS<10T SKIN:intact,warm,dry  ASSESSMENT AND PLAN 77 yo obese AAF with severe COPD exacerbation, multiple COPD admission in the past year Patient was made DNR/DNI last week, patient subsequently with increased wob and SOB and was intubated in ER this AM  Sedation turned down and family at bedside Patient understands that she ion vetn support and that she wants the tube tio be removed She does NOT want any other  family members around  Patient understands that she will be made comfortable and will die if her breathing gets worse The family at bedside has witnessed this interaction and would like to proceed with extubation and proceed  with comfort care measures if needed   Severe Hypoxic and Hypercapnic Respiratory Failure -continue Full MV support -continue Bronchodilator Therapy -Wean Fio2 and PEEP as tolerated -will perform SAT/SBt when respiratory parameters are met    NEUROLOGY - intubated and sedated - minimal sedation to achieve a RASS goal: -1   DVT/GI PRX ordered TRANSFUSIONS AS NEEDED MONITOR FSBS ASSESS the need for LABS as needed   Critical Care Time devoted to patient care services described in this note is 45 minutes.   Overall, patient is critically ill, prognosis is guarded.  Patient with Multiorgan failure and at high risk for cardiac arrest and death.    Corrin Parker, M.D.  [facility]  Medical Director [place] Director [facility] Cardio-Pulmonary Department

## 2018-02-25 NOTE — Progress Notes (Signed)
   03-07-18 2110  Clinical Encounter Type  Visited With Family  Visit Type Follow-up;Spiritual support  Referral From Nurse  Consult/Referral To Chaplain  Spiritual Encounters  Spiritual Needs Emotional;Grief support   CH received PG that PT was not doing well. ICU was attempting to transfer PT to 1C for comfort care. PT passed before she could be transferred. CH provided emotional and grief support.

## 2018-02-25 NOTE — Progress Notes (Signed)
CODE SEPSIS - PHARMACY COMMUNICATION  **Broad Spectrum Antibiotics should be administered within 1 hour of Sepsis diagnosis**  Time Code Sepsis Called/Page Received: 6979  Antibiotics Ordered: Cefepime/Vancomycin  Time of 1st antibiotic administration: [place], [facility] Clinical Pharmacist  Mar 16, 2018  9:15 AM

## 2018-02-25 DEATH — deceased

## 2019-04-21 IMAGING — DX DG CHEST 1V PORT
1 series · 1 of 1 positions shown · non-contrast
Comparison: One-view chest x-ray 05/06/2017

CLINICAL DATA: Congestive heart failure.

EXAM:
PORTABLE CHEST 1 VIEW

[chest ap]
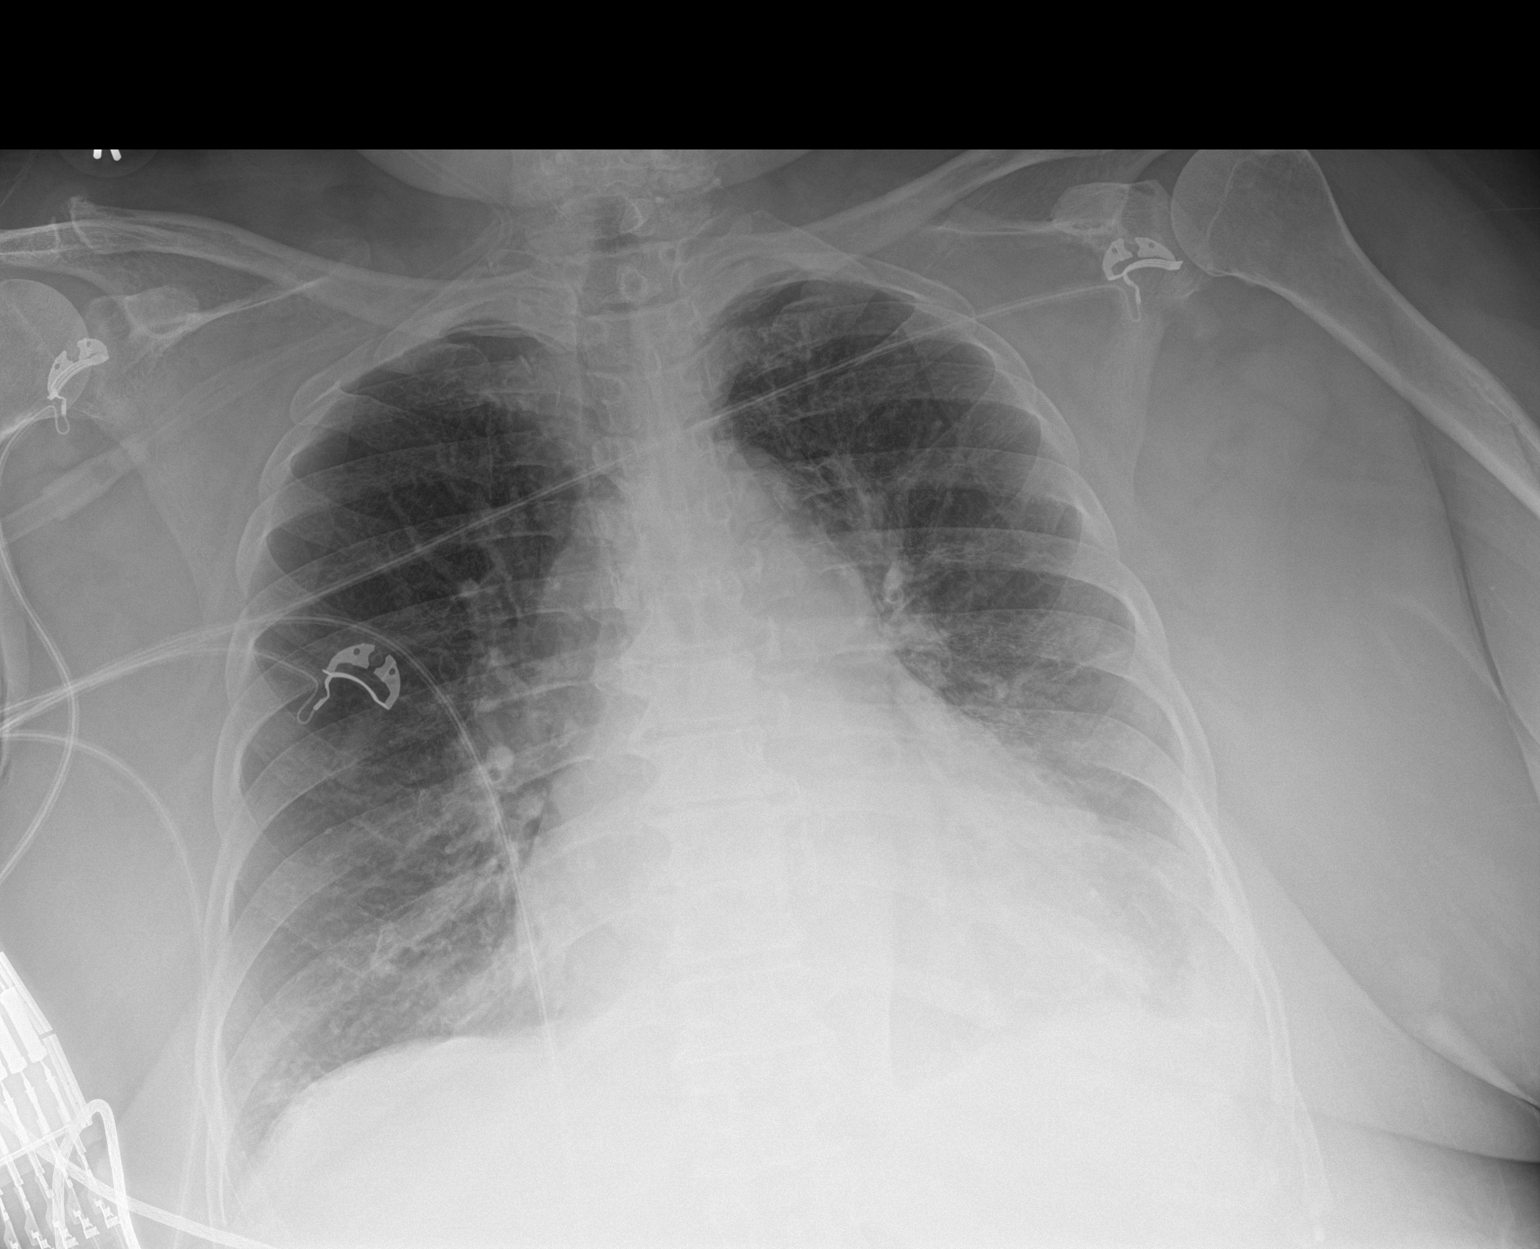

[1 of 1 positions shown; findings below may reference images not displayed]

FINDINGS: The heart is enlarged. Mild edema is stable. Bibasilar airspace
disease likely reflects atelectasis without significant interval
change.
IMPRESSION: 1. Stable cardiac enlargement and mild pulmonary edema.
2. Stable bibasilar airspace disease, likely atelectasis.

## 2019-05-12 IMAGING — CT CT ANGIO CHEST
1 of 6 series · 19 of 36 positions shown · IV contrast (APPLIED)
Comparison: 05/28/2017 CXR

CLINICAL DATA: Increasing dyspnea over the past few days with
exertion in particular. History of asthma.

EXAM:
CT ANGIOGRAPHY CHEST WITH CONTRAST
TECHNIQUE: Multidetector CT imaging of the chest was performed using the
standard protocol during bolus administration of intravenous
contrast. Multiplanar CT image reconstructions and MIPs were
obtained to evaluate the vascular anatomy.
CONTRAST:  60 cc Isovue 370 IV

[Series 5: thins · axial · 0.74mm/px · z∈[+95,+329]mm · 19 of 261 slices shown]
[im 14/261  lung]
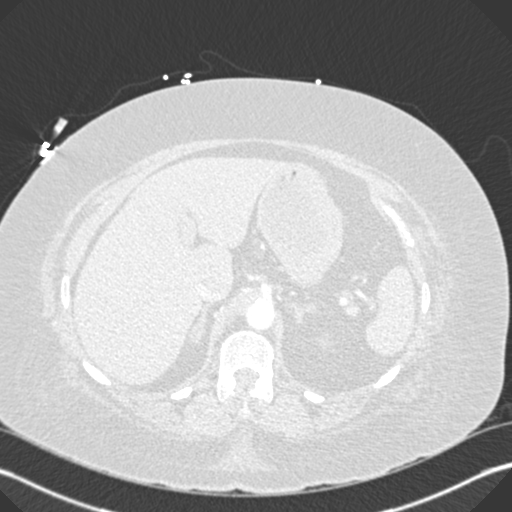
[im 27/261  mediastinal]
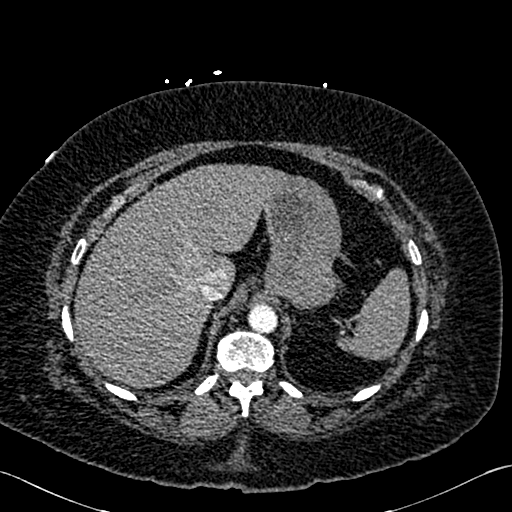
[im 40/261  lung]
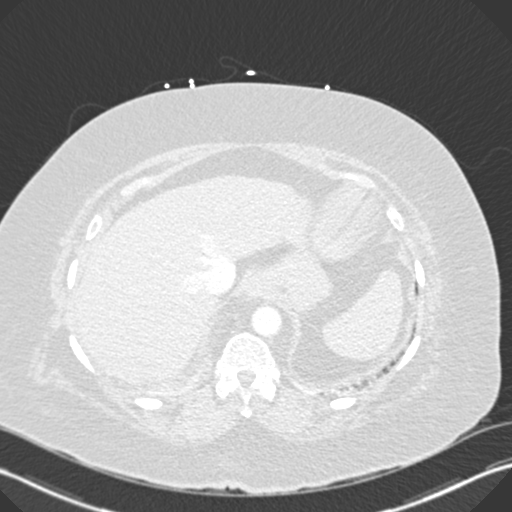
[im 53/261  mediastinal]
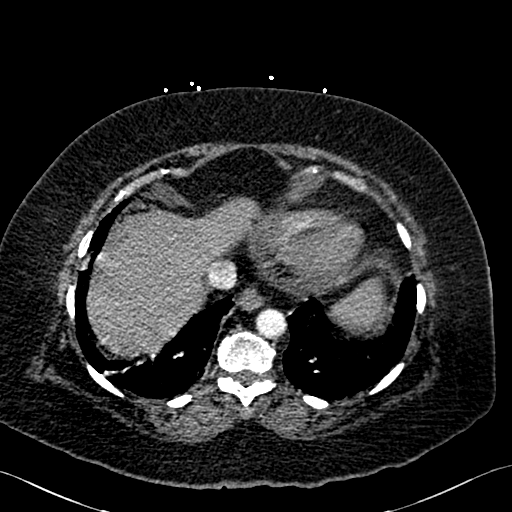
[im 66/261  lung]
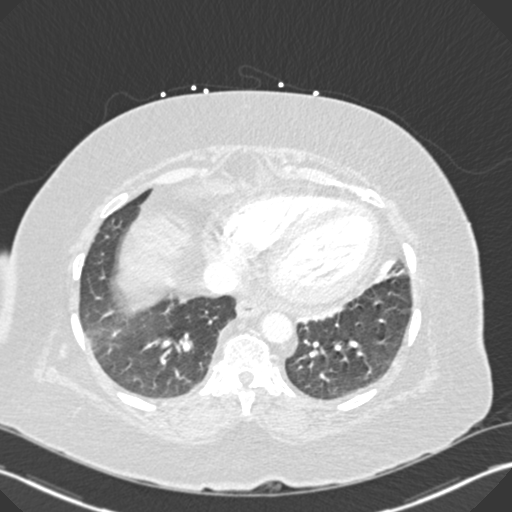
[im 79/261  mediastinal]
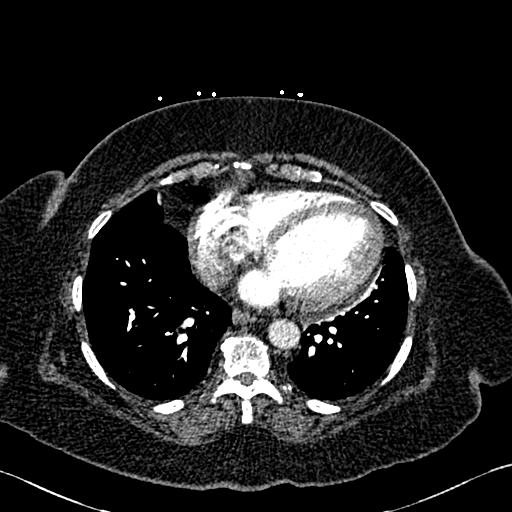
[im 92/261  lung]
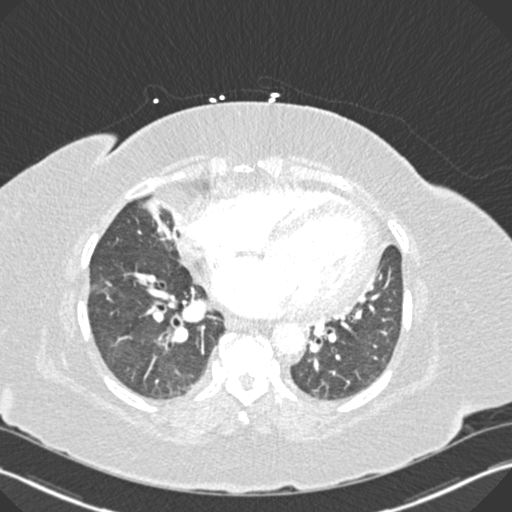
[im 105/261  mediastinal]
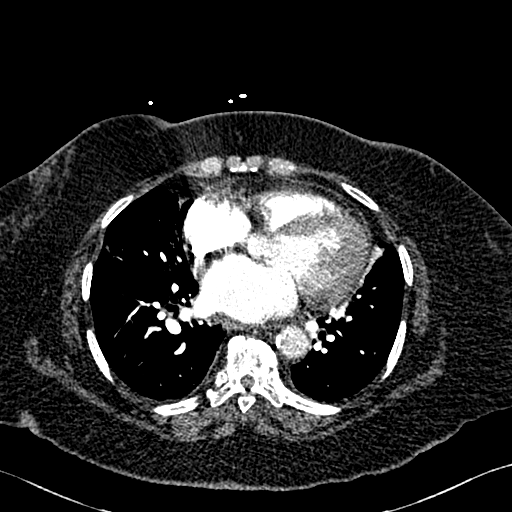
[im 118/261  lung]
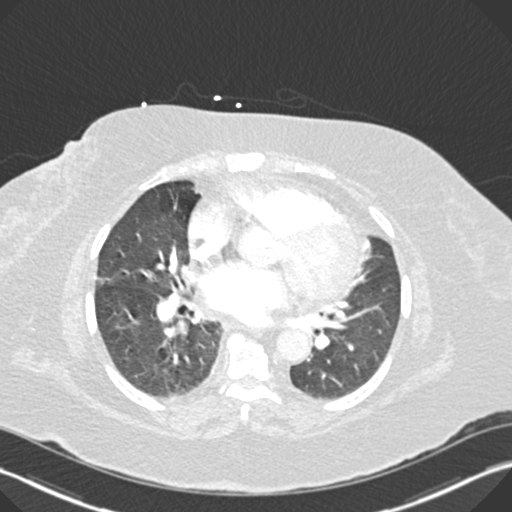
[im 131/261  mediastinal]
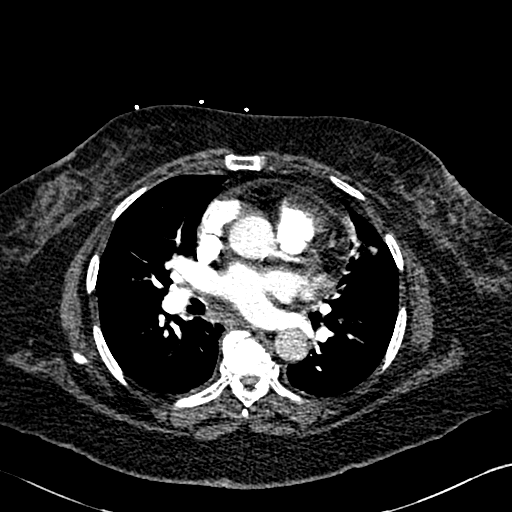
[im 144/261  lung]
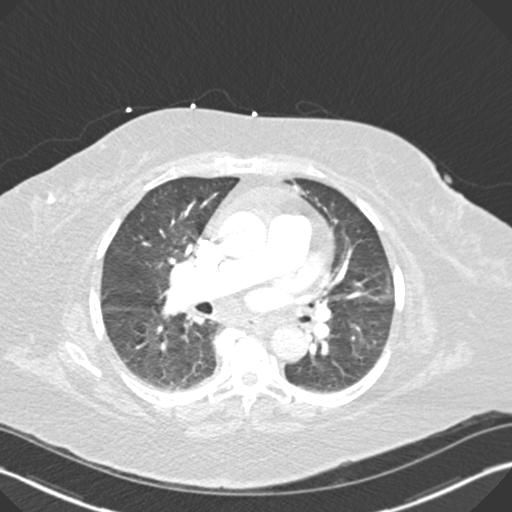
[im 157/261  mediastinal]
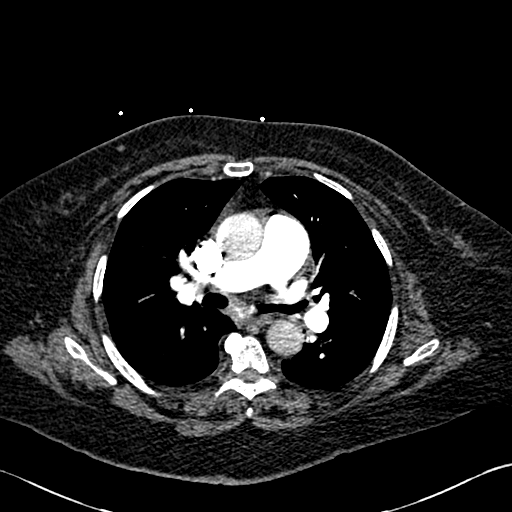
[im 170/261  lung]
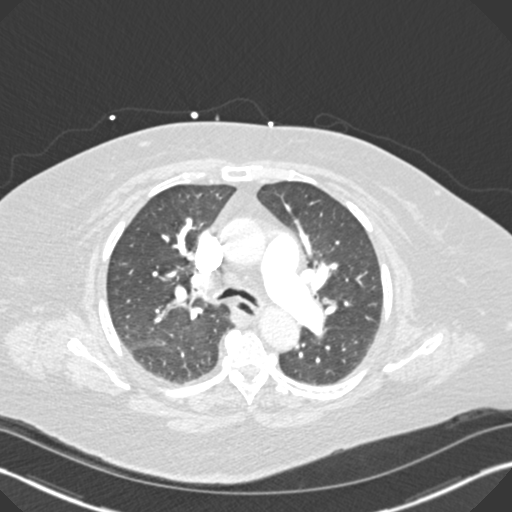
[im 183/261  mediastinal]
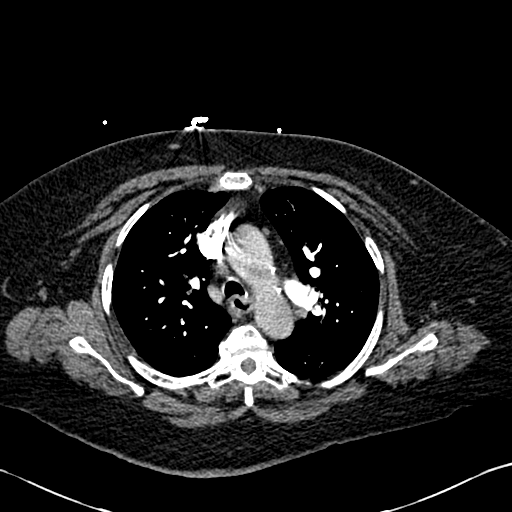
[im 196/261  lung]
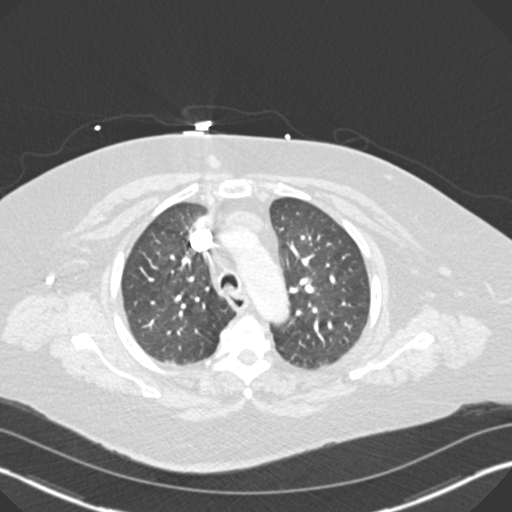
[im 209/261  mediastinal]
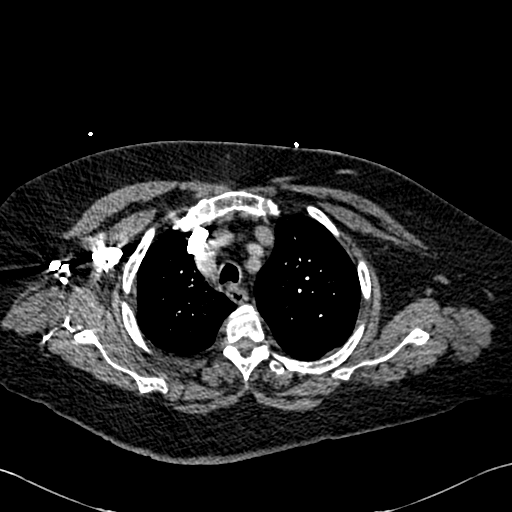
[im 222/261  lung]
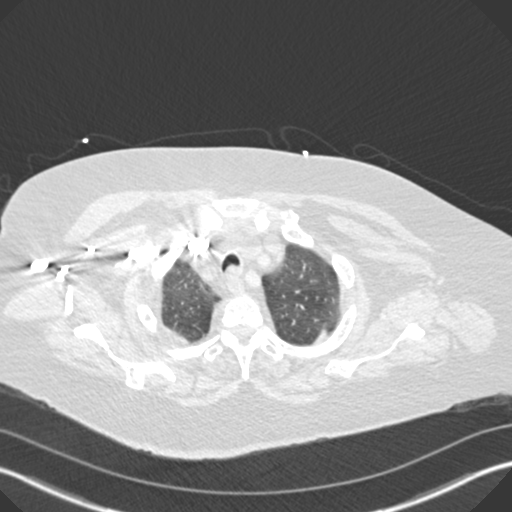
[im 235/261  mediastinal]
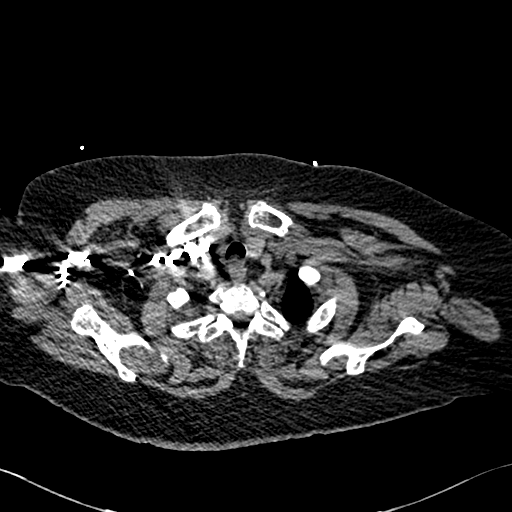
[im 248/261  lung]
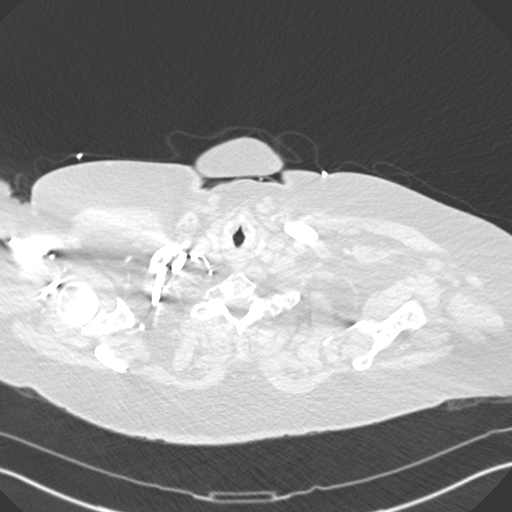

[19 of 36 positions shown; findings below may reference images not displayed]

FINDINGS: Cardiovascular: No acute pulmonary embolus. Aortic atherosclerosis
with mild ectasia of the ascending aorta. Cardiomegaly without
pericardial effusion or thickening. Coronary arteriosclerosis along
the left main, LAD, RCA and circumflex. Mild dilatation of the main
pulmonary artery to 3.4 cm consistent with a component of chronic
pulmonary hypertension.

Mediastinum/Nodes: No enlarged mediastinal, hilar, or axillary lymph
nodes. Thyroid gland, trachea, and esophagus demonstrate no
significant findings.

Lungs/Pleura: Limited by respiratory motion artifacts. There is mild
bronchiectasis to both lower lobes and right middle lobe with
streaky atelectasis at each lung base. No pneumonic consolidation,
effusion or pneumothorax. There appears to be centrilobular
emphysema bilaterally.

Upper Abdomen: No acute abnormality.

Musculoskeletal: No chest wall abnormality. No acute or significant
osseous findings.

Review of the MIP images confirms the above findings.
IMPRESSION: 1. Cardiomegaly with aortic atherosclerosis.
2. Mild centrilobular emphysema.
3. No pulmonary embolus. Mild dilatation of the main pulmonary
artery to 3.4 cm which may reflect chronic pulmonary hypertensive
change.
4. Coronary arteriosclerosis.

Aortic Atherosclerosis (2HRD6-EJZ.Z) and Emphysema (2HRD6-G7D.L).

## 2019-05-13 IMAGING — DX DG CHEST 1V PORT
1 series · 1 of 1 positions shown · non-contrast
Comparison: 05/28/2017 chest CT

CLINICAL DATA: Asthma and congestive heart failure.  Hypoxia.

EXAM:
PORTABLE CHEST 1 VIEW

[chest ap]
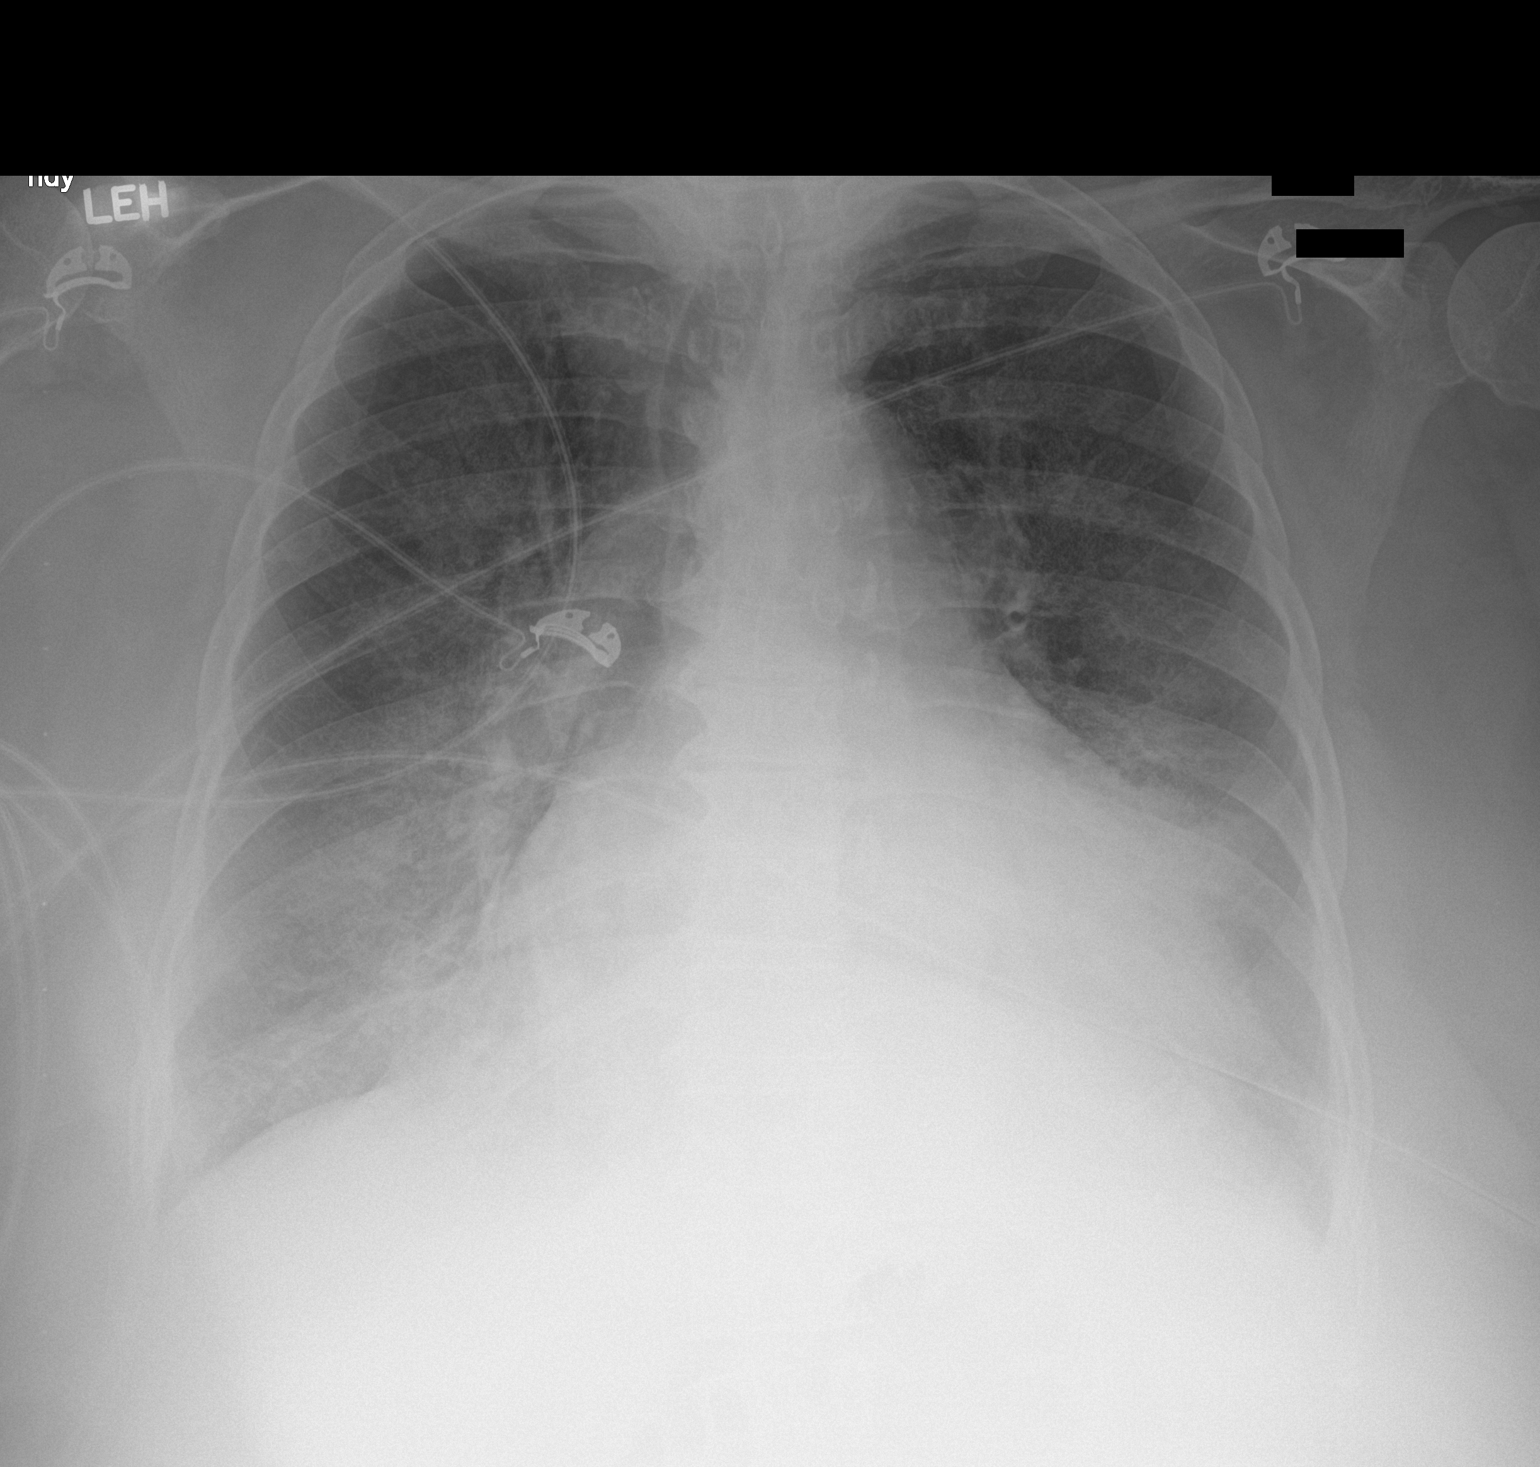

[1 of 1 positions shown; findings below may reference images not displayed]

FINDINGS: Mild-to-moderate enlargement of the cardiopericardial silhouette
with indistinct pulmonary vasculature and hazy perihilar and basilar
opacities favoring progressive pulmonary edema or less likely
atypical pneumonia.

Thoracic spondylosis.
IMPRESSION: 1. Progressive hazy perihilar and basilar opacities favoring edema
over atypical pneumonia. Underlying cardiomegaly noted.
